# Patient Record
Sex: Male | Born: 1940 | Race: White | Hispanic: No | Marital: Married | Smoking: Never smoker
Health system: Southern US, Community
[De-identification: ages and names within clinical notes are randomized; demographics above are authoritative.]

## PROBLEM LIST (undated history)

## (undated) DIAGNOSIS — R42 Dizziness and giddiness: Secondary | ICD-10-CM

## (undated) DIAGNOSIS — Z9889 Other specified postprocedural states: Secondary | ICD-10-CM

## (undated) DIAGNOSIS — R112 Nausea with vomiting, unspecified: Secondary | ICD-10-CM

## (undated) DIAGNOSIS — M1990 Unspecified osteoarthritis, unspecified site: Secondary | ICD-10-CM

## (undated) DIAGNOSIS — I10 Essential (primary) hypertension: Secondary | ICD-10-CM

## (undated) DIAGNOSIS — G4730 Sleep apnea, unspecified: Secondary | ICD-10-CM

## (undated) DIAGNOSIS — I714 Abdominal aortic aneurysm, without rupture: Secondary | ICD-10-CM

## (undated) DIAGNOSIS — K219 Gastro-esophageal reflux disease without esophagitis: Secondary | ICD-10-CM

## (undated) DIAGNOSIS — E785 Hyperlipidemia, unspecified: Secondary | ICD-10-CM

## (undated) DIAGNOSIS — R200 Anesthesia of skin: Secondary | ICD-10-CM

## (undated) DIAGNOSIS — A4902 Methicillin resistant Staphylococcus aureus infection, unspecified site: Secondary | ICD-10-CM

## (undated) DIAGNOSIS — I2510 Atherosclerotic heart disease of native coronary artery without angina pectoris: Secondary | ICD-10-CM

## (undated) DIAGNOSIS — Z8619 Personal history of other infectious and parasitic diseases: Secondary | ICD-10-CM

## (undated) HISTORY — DX: Essential (primary) hypertension: I10

## (undated) HISTORY — PX: OTHER SURGICAL HISTORY: SHX169

## (undated) HISTORY — DX: Atherosclerotic heart disease of native coronary artery without angina pectoris: I25.10

## (undated) HISTORY — DX: Hyperlipidemia, unspecified: E78.5

## (undated) HISTORY — PX: PICC LINE PLACE PERIPHERAL (ARMC HX): HXRAD1248

## (undated) HISTORY — PX: ANGIOPLASTY: SHX39

## (undated) HISTORY — PX: ABDOMINAL AORTIC ANEURYSM REPAIR W/ ENDOLUMINAL GRAFT: SUR7

## (undated) HISTORY — DX: Abdominal aortic aneurysm, without rupture: I71.4

---

## 2004-06-07 ENCOUNTER — Inpatient Hospital Stay (HOSPITAL_COMMUNITY): Admission: EM | Admit: 2004-06-07 | Discharge: 2004-06-11 | Payer: Self-pay | Admitting: Emergency Medicine

## 2004-06-07 ENCOUNTER — Encounter (INDEPENDENT_AMBULATORY_CARE_PROVIDER_SITE_OTHER): Payer: Self-pay | Admitting: *Deleted

## 2004-06-07 HISTORY — PX: TRANSTHORACIC ECHOCARDIOGRAM: SHX275

## 2004-06-08 HISTORY — PX: OTHER SURGICAL HISTORY: SHX169

## 2004-06-08 HISTORY — PX: CARDIAC CATHETERIZATION: SHX172

## 2004-06-21 ENCOUNTER — Inpatient Hospital Stay (HOSPITAL_COMMUNITY): Admission: EM | Admit: 2004-06-21 | Discharge: 2004-06-28 | Payer: Self-pay | Admitting: Emergency Medicine

## 2004-06-25 DIAGNOSIS — I714 Abdominal aortic aneurysm, without rupture, unspecified: Secondary | ICD-10-CM

## 2004-06-25 HISTORY — DX: Abdominal aortic aneurysm, without rupture: I71.4

## 2004-06-25 HISTORY — DX: Abdominal aortic aneurysm, without rupture, unspecified: I71.40

## 2004-12-18 ENCOUNTER — Encounter: Admission: RE | Admit: 2004-12-18 | Discharge: 2004-12-18 | Payer: Self-pay | Admitting: Sports Medicine

## 2004-12-18 ENCOUNTER — Ambulatory Visit: Payer: Self-pay | Admitting: Sports Medicine

## 2005-01-18 ENCOUNTER — Ambulatory Visit: Payer: Self-pay | Admitting: Family Medicine

## 2005-02-19 ENCOUNTER — Encounter: Admission: RE | Admit: 2005-02-19 | Discharge: 2005-02-19 | Payer: Self-pay | Admitting: Sports Medicine

## 2006-08-22 DIAGNOSIS — I712 Thoracic aortic aneurysm, without rupture, unspecified: Secondary | ICD-10-CM | POA: Insufficient documentation

## 2006-08-22 DIAGNOSIS — I10 Essential (primary) hypertension: Secondary | ICD-10-CM

## 2006-08-22 DIAGNOSIS — I2510 Atherosclerotic heart disease of native coronary artery without angina pectoris: Secondary | ICD-10-CM

## 2006-08-22 DIAGNOSIS — I719 Aortic aneurysm of unspecified site, without rupture: Secondary | ICD-10-CM

## 2007-11-19 ENCOUNTER — Inpatient Hospital Stay (HOSPITAL_COMMUNITY): Admission: EM | Admit: 2007-11-19 | Discharge: 2007-11-21 | Payer: Self-pay | Admitting: Emergency Medicine

## 2009-02-14 HISTORY — PX: CARDIOVASCULAR STRESS TEST: SHX262

## 2010-11-10 NOTE — Discharge Summary (Signed)
NAMECLAUDELL, [name] NO.:  [id number]   MEDICAL RECORD NO.:  [id number]          PATIENT TYPE:  INP   LOCATION:  4703                         FACILITY:  [facility]   PHYSICIAN:  Madaline Savage, M.D.DATE OF BIRTH:  08-Oct-1940   DATE OF ADMISSION:  11/19/2007  DATE OF DISCHARGE:  11/21/2007                               DISCHARGE SUMMARY   DISCHARGE DIAGNOSES:  1. Coronary disease, catheterization this admission revealing patent      diagonal and left anterior descending stents.  2. Known coronary disease with previous left anterior descending and      diagonal stenting in 2005.  3. Aortic stent grafting at [facility], I believe in 2006.  4. Treated hypertension.  5. Dyslipidemia with statin intolerance.   HOSPITAL COURSE:  The patient is a 70 year old male followed by Dr.  Elsie Lincoln.  He has had previous LAD and diagonal stenting.  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  This was treated with aortic stent grafting at [facility], I  believe this was in early 2006.  The patient presented on Nov 19, 2007,  with substernal chest pain worrisome for unstable angina.  He has had  nuclear study in November 2008, which showed low-risk inferior ischemia  with an EF of 51%.  The patient was admitted to telemetry.  Troponins  were negative.  He was set up for CT of his chest and abdomen to  evaluate his stent graft and these were negative.  The plan was to  proceed with diagnostic catheterization.  The patient was put on  heparin.  CT scan did show no dissection, there was a question of a  penetrating ulcer noted.  His enzymes were negative.  This will be  reviewed by Dr. Elsie Lincoln, who felt he could undergo diagnostic  catheterization.  This was done on Nov 20, 2007, which revealed normal  RCA, normal left main, normal circumflex, patent LAD stent, patent  diagonal stent with a well opposed patent aortic stent graft that  appeared to extend into the iliacs.  Plan was to ambulate him.  If he  had continued pain, we were to pursue a GI consult.  He was discharged  later on Nov 21, 2007.  He will follow up with Dr. Elsie Lincoln.   DISCHARGE MEDICATIONS:  1. Metoprolol 25 mg twice a day.  2. Protonix 40 mg a day.  3. Aspirin 325 mg a day.  4. Plavix 75 mg a day.  5. Norvasc 5 mg a day.  6. Lisinopril 5 mg a day.   LABORATORY DATA:  EKG shows sinus rhythm with nonspecific ST changes.  CT scan is as noted above.  White count 10.5, hemoglobin 13.5,  hematocrit 39.2, platelets 136, INR 1.2, sodium 138, potassium 3.5, BUN  15, and creatinine 1.0.  Liver functions were normal.  TSH is 1.3 and  LDL was 100.   DISPOSITION:  The patient was discharged in stable condition and will  follow up with Dr. Elsie Lincoln in 1-3 months.  [name], P.A.    ______________________________  Madaline Savage, M.D.    Lenard Lance  D:  12/18/2007  T:  12/19/2007  Job:  [id number]

## 2010-11-10 NOTE — Discharge Summary (Signed)
[name], Werner NO.:  [id number]   MEDICAL RECORD NO.:  [id number]          PATIENT TYPE:  INP   LOCATION:  2021                         FACILITY:  [facility]   PHYSICIAN:  Madaline Savage, M.D.DATE OF BIRTH:  1940/09/10   DATE OF ADMISSION:  06/21/2004  DATE OF DISCHARGE:  06/28/2004                                 DISCHARGE SUMMARY   DISCHARGE DIAGNOSES:  1.  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  2.  Coronary disease, left anterior descending and diagonal stenting      June 08, 2004.  3.  Hypertension, under better control at discharge,  4.  Dyslipidemia.   HOSPITAL COURSE:  The patient is a 70 year old male followed by Dr. Elsie Lincoln  with had LAD and diagonal stenting June 08, 2004.  His EF is 45-55%.  He  was admitted June 21, 2004, with back pain and chest pain.  He was seen  by Dr. Clarene [facility] on admission.  His EKG was normal.  His enzymes were negative.  Chest x-ray was abnormal with some left basilar atelectasis versus effusion  and a CT scan was obtained.  This revealed a penetrating ulcer with local  dissection and pseudoaneurysm in the descending thoracic aorta.  The patient  seen by Dr. Tyrone Sage.  Plan was for tight medical control and avoidance of  heparin if possible.  His heparin was discontinued and he was monitored.  He  was watched closely in the hospital and his medications were adjusted for  hypertension.  We transferred him to floor and ambulated him.  Symptomatically, he did well.  We did feel he a concomitant case of  bronchitis and he was treated with antibiotics for this.  The patient was  ultimately transferred to [facility] for further evaluation.   TRANSFER MEDICATIONS:  1.  Lopressor 50 mg t.i.d.  2.  Avelox 400 mg a day.  3.  Lipitor 10 mg a day.  4.  Avapro 300 mg a day.  5.  Protonix 40 mg a day.  6.  Plavix 75 mg a day.  7.  Aspirin 81 mg a day.  8.  Norvasc 5 mg a day.   LABORATORY DATA AND X-RAY  FINDINGS:  EKG shows sinus rhythm, LVH, no acute  changes.  CTs are as noted above.   At transfer, his white count is 6.7, hemoglobin 12.6, hematocrit 37.3,  platelets 340.  INR 1.1.  Sodium 137, potassium 4.1, BUN nine, creatinine  1.1.  Liver functions normal.  CK-MB, troponins were negative.  Urinalysis  was unremarkable.   DISPOSITION:  The patient is transferred to [facility].  His chart was  copied and sent with him.      LKK/MEDQ  D:  09/15/2004  T:  09/16/2004  Job:  [id number]

## 2010-11-10 NOTE — Cardiovascular Report (Signed)
NAMESIDHANT, HELDERMAN NO.:  [id number]   MEDICAL RECORD NO.:  [id number]          PATIENT TYPE:  INP   LOCATION:  2931                         FACILITY:  [facility]   PHYSICIAN:  Madaline Savage, M.D.DATE OF BIRTH:  1941-05-14   DATE OF PROCEDURE:  08/10/2003  DATE OF DISCHARGE:                              CARDIAC CATHETERIZATION   History and Physical  1.  Selective coronary angiography by Judkins technique.  2.  Retrograde left heart catheterization.  3.  Left ventricular angiography.  4.  Abdominal aortography.  5.  Percutaneous coronary angioplasty followed by stenting of the mid      diagonal branch of the left coronary artery.  6.  Direct coronary stenting of the mid left anterior descending coronary      artery.   COMPLICATIONS:  None.   ENTRY SITE:  Right femoral.   CATHETERS USED:  6-French diagnostic catheters and 6-French [facility]  Scientific FL4 guide catheter.   MEDICATIONS GIVEN:  1.  Fentanyl 25 mg IV on 2 occasions.  2.  Aspirin 81 mg.  3.  Intravenous nitroglycerin.  4.  Integrilin double bolus technique followed by infusion.  5.  Ativan 1 mg IV.  6.  Demerol 5 mg and Phenergan 25 mg IV on one occasion.   PATIENT PROFILE:  The patient is a 70 year old gentleman who has no cardiac  history or chest pain, who was admitted to the emergency room after he  developed epigastric pain at work after taking medications.  He was put on a  steroids, Integrilin, and was ultimately brought to [facility].  He  had more chest pain after admission and was seen at 4 o'clock in the morning  by Dr. Effie Shy who was on call for [facility] which was  described as a worsening chest pain with ache in the lower substernal area.  No response to antacids or GI cocktail.  Increasing nitroglycerin gave  unclear improvement but possibly some.  He did have some T inversions in aVL  and some flattening in V6.  It was felt by Dr. Effie Shy that  this was  unstable angina, and we brought the patient to the cardiac catheterization  lab this morning after moving him to the CCU.   PRESSURES:  Left ventricular pressure was 125/5, end-diastolic pressure 14,  central aortic pressure 125/70, mean of 95.  No aortic valve gradient by  pullback technique.   ANGIOGRAPHIC RESULTS:  The left main coronary artery was very large in  diameter and tapered distally.  I cannot say that it was actually stenosed,  but it did taper distally.   The left anterior descending coronary artery coursed to the cardiac apex,  giving rise to one major diagonal branch but which arose near a very large  bifurcating septal perforator branch.  The proximal LAD contained tubular  calcification that almost looked like an intracoronary stent.  The patient  was repeatedly questioned and reported that no stent had ever been placed in  his LAD.  The first diagonal branch arose just at or near the first  septal  perforator branch and contained a discrete 90% stenosis in a vessel that was  2.5 mm in diameter at the area of stenosis in a fairly long vessel.   The LAD contained two lesions of definite concern, both were 75% or more in  severity.  The first of these areas was in the mid LAD about 15 to 20 mm  beyond the septal perforator branch.  The second was about 20 mm more distal  to the first area of stenosis, and it, too, was 75% in severity.  It was  fairly focal.  The very distal LAD was normal.   The circumflex coronary artery was nondominant, giving rise to a large  trifurcating first obtuse marginal branch which contained no distal lesions.  The mid and distal circumflex were rather small.  No lesions were seen in  the circumflex.   The right coronary artery was a huge vessel in circumference, giving rise to  a very large posterior descending and bifurcating posterolateral branch.  The RCA was diffusely irregular throughout, but no significant lesions were   noted.   Left ventricular angiography showed mild hypokinesis of the anterolateral  wall, good apical and inferior wall motion, no evidence of prolapse or  mitral regurgitation.  I estimated ejection fraction at 45 to 55%  qualitatively.   Abdominal aortogram showed both renal arteries to be normal.  The abdominal  aorta showed ectasia below the renals and above the common iliacs.  No  discrete aneurysm was seen.   PERCUTANEOUS CORONARY INTERVENTION:  Percutaneous coronary intervention was  then undertaken.  We gave the patient heparin adjusted for weight-based  Integrilin and obtained an ACT of 260.  We likewise gave a double bolus  Integrilin before beginning the intervention and continued intravenous  Integrilin drip during the procedure.  The guide catheter used was a 6-  [place] L4 guide catheter by [employer] which did not provide the best  backup but did provide a very soft tip that came in handy later.   The diagonal was the first area addressed.  We approached that with a  Patriot wire which easily crossed the lesion and, with some difficulty, was  brought to rest in the distal vessel.  I pre-dilated this lesion with a 2.25  Maverick balloon to get a better idea of how big this vessel was.  There was  watermelon seeding of this balloon, and it never really did much of a job of  performing angioplasty on the vessel with two attempts at balloon inflation.  I, therefore, resorted to a 2 x 2.5 x 16 mm Taxus stent which crossed the  lesion with some difficulty but was brought to rest in a satisfactory  position and was then deployed to 15 atmospheres of pressure corresponding  to an anticipated lumen diameter of 2.8.  There was preservation of TIMI-3  distal flow, and a 75% lesion was reduced to 0% residual with preservation  of the distal vessel and a smooth contour to the stented artery.  There was some difficulty getting the Taxus stent out of the vessel, but this was   accomplished by removing guide catheter, balloon, and guide wire  simultaneously.   The next vessel approached by the percutaneous intervention was the LAD.  For this vessel, I used a Forte wire which allowed [place] to size the vessel  appropriately.  We decided, rather than using two overlapping stents, to use  a single stent which would cover both areas of  lesions in the mid LAD.  The  Forte wire was placed easily anatomically.  The stent then crossed the  lesions with some difficulty.  It was a 2.75 x 28 mm Taxus stent.  I  deployed the stent after confirming proper location in multiple views, and  marked ST segment elevation occurred followed by fairly impressive and quick  re-equalization of ST segments at baseline following balloon deflation.  I  did two inflations to a peak inflation pressure of 9 atmospheres and got a  beautiful result with preservation of distal flow in the vessel and  resolution of the 75% areas of stenosis. This balloon, guidewire, guide  catheter combination, I again had to remove the entire combination of the  three together in order to get the stent balloon to come out.  It did so  atraumatically, and the patient had beautiful TIMI-3 flow in the vessel and  resolution of both stenoses.   COMPLICATIONS:  No complications occurred during the case.  The patient was  somewhat difficult to manage in terms of back pain, and he reported feeling  dizzy at times despite a normal blood pressure and heart rate.  No  significant complications occurred.   FINAL DIAGNOSES:  1.  Coronary artery disease, predominantly affecting left anterior      descending.      1.  90% mid diagonal branch stenosis.      2.  75% lesions at two areas in mid left anterior descending .  2.  Mildly decreased left ventricular systolic function, ejection fraction      45 to 55%.  3.  Successful percutaneous coronary stenting of the first diagonal branch      of the left anterior descending with  resolution of 90% lesion to 0%.  4.  Successful percutaneous stenting of the mid left anterior descending      with two lesions reduced to 0% residual with a single stent.       WHG/MEDQ  D:  06/08/2004  T:  06/08/2004  Job:  [id number]   cc:   Cardiac Catheterization Labortory

## 2010-11-10 NOTE — Discharge Summary (Signed)
NAMERODEL, GLASPY NO.:  [id number]   MEDICAL RECORD NO.:  [id number]          PATIENT TYPE:  INP   LOCATION:  2027                         FACILITY:  [facility]   PHYSICIAN:  Madaline Savage, M.D.DATE OF BIRTH:  11/15/1940   DATE OF ADMISSION:  06/07/2004  DATE OF DISCHARGE:  06/11/2004                                 DISCHARGE SUMMARY   ADMISSION DIAGNOSES:  1.  Atypical chest pain with epigastric distress.  2.  History of hives treated with steroids and Benadryl.  3.  Positive family history of coronary artery disease.   DISCHARGE DIAGNOSES:  1.  Chest pain with two-vessel disease.  Ejection fraction of 45-50%.  2.  Leukocytosis.  3.  Positive family history.   PROCEDURE:  Cardiac catheterization with percutaneous coronary intervention  and stenting of the left anterior descending and diagonal with Taxus stents  on June 08, 2004, Dr. Chanda Busing.   HISTORY OF PRESENT ILLNESS:  The patient is a 70 year old, white male with  no primary care doctor or cardiologist.  He developed epigastric pain at  work after taking medications.  He was on steroid dose pack, Benadryl and  Tagamet for 1 week for previous illness with hives.  His chest discomfort  became worse and he did have some diaphoresis and came to the ER.   PAST MEDICAL HISTORY:  Hives.   CURRENT MEDICATIONS:  None.   ALLERGIES:  No known drug allergies.   SOCIAL HISTORY:  Never smoked.  Married with two children and three  grandchildren.  He works as a Curator.   FAMILY HISTORY:  Father unknown.  Mother died in 28s.  Brother with a  history of CABG.   REVIEW OF SYSTEMS:  Negative.   For further History and Physical, please see the dictated note.   HOSPITAL COURSE:  The patient was admitted and placed on telemetry.  He had  progression of his chest pain on June 08, 2004.  He was placed on IV  nitroglycerin and transferred to the CCU where symptoms resolved.  His CKs  and  troponins were negative.  His only lab abnormality was an elevated white  count.  He subsequently underwent cardiac catheterization on June 08, 2004.  This showed the right coronary artery to be normal.  The EF was 45-  55%.  There was no MR.  The left circumflex was normal with no  abnormalities.  The LAD had a 75% stenosis followed by a second 75% stenosis  in the mid part.  The first diagonal had a 90% stenosis.  The patient  subsequently underwent PCI and Taxus stenting of both the long, 75% LAD and  the 90% diagonal lesion.  The patient tolerated the procedure well and was  transferred to the floor.  Other screening labs were normal on admission.  He was started on low dose Lipitor for stroke and MI prevention and he  planned to add Niacin later.  He has been mobilized and transferred to the  floor.  He was making good progress and by June 11, 2004, it was Dr.  McQueen's opinion he was ready for discharge.   DISCHARGE MEDICATIONS:  1.  Aspirin 81 mg daily.  2.  Plavix 75 mg daily.  3.  Avapro 150 mg q.a.m.  4.  Lopressor 25 mg b.i.d.  5.  Protonix 40 mg q.a.m.  6.  Nitroglycerin p.r.n.  7.  Lipitor 10 mg daily h.s.   ACTIVITY:  Light to moderate with no lifting over 10 pounds, no driving or  strenuous activity.   DISCHARGE LABORATORY DATA AND X-RAY FINDINGS:  White count 14.9, hemoglobin  13.7, hematocrit 40.5, platelets 156,000.  BMP with sodium 130, potassium 4,  chloride 101, CO2 25, glucose 90, BUN 19, creatinine 1.2.  Troponins and CKs  were all negative.  Lipid profile showed a cholesterol of 108, HDL 31, LDL  66.  H. pylori was negative.  Lipase was normal.  Urinalysis on admission  was normal.  BNP on admission was less than 30.  D-dimer was 1.56.   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   FOLLOW UP:  The patient will return to see Dr. Orvan Falconer in 2 weeks.   CONDITION ON DISCHARGE:  Improved.       WDJ/MEDQ  D:  06/11/2004   T:  06/12/2004  Job:  [id number]

## 2011-03-21 LAB — MAGNESIUM: Magnesium: 1.8

## 2011-03-21 LAB — POCT I-STAT, CHEM 8
BUN: 19
Calcium, Ion: 1.17
Chloride: 105
Creatinine, Ser: 1.3
Glucose, Bld: 89
HCT: 48
Hemoglobin: 16.3
Potassium: 3.7
Sodium: 140
TCO2: 25

## 2011-03-21 LAB — COMPREHENSIVE METABOLIC PANEL
ALT: 23
AST: 26
Albumin: 3.2 — ABNORMAL LOW
Alkaline Phosphatase: 85
BUN: 17
CO2: 24
Calcium: 8.6
Chloride: 106
Creatinine, Ser: 1.08
GFR calc Af Amer: >60
GFR calc non Af Amer: >60
Glucose, Bld: 119 — ABNORMAL HIGH
Potassium: 3.8
Sodium: 138
Total Bilirubin: 0.8
Total Protein: 6.2

## 2011-03-21 LAB — CBC
HCT: 39.9
HCT: 46.2
Hemoglobin: 13.5
Hemoglobin: 13.6
Hemoglobin: 15.6
MCHC: 33.8
MCHC: 34.4
MCV: 88.6
MCV: 88.8
Platelets: 191
RBC: 4.42
RBC: 4.5
RBC: 5.22
RDW: 14.1
RDW: 14.5
WBC: 10.5
WBC: 11.1 — ABNORMAL HIGH
WBC: 8.8

## 2011-03-21 LAB — POCT CARDIAC MARKERS
CKMB, poc: 1.8
CKMB, poc: 1.9
CKMB, poc: 2.3
Myoglobin, poc: 107
Myoglobin, poc: 123
Myoglobin, poc: 138
Operator id: 234501
Operator id: 234501
Operator id: 234501
Troponin i, poc: <0.05
Troponin i, poc: <0.05
Troponin i, poc: <0.05

## 2011-03-21 LAB — DIFFERENTIAL
Basophils Absolute: 0
Basophils Relative: 0
Eosinophils Absolute: 0.1
Eosinophils Relative: 1
Lymphocytes Relative: 14
Lymphs Abs: 1.3
Monocytes Absolute: 0.8
Monocytes Relative: 9
Neutro Abs: 6.6
Neutrophils Relative %: 75

## 2011-03-21 LAB — CK TOTAL AND CKMB (NOT AT ARMC)
CK, MB: 2.9
CK, MB: 4.1 — ABNORMAL HIGH
Relative Index: 1.7
Relative Index: 2.1
Total CK: 169
Total CK: 191

## 2011-03-21 LAB — BASIC METABOLIC PANEL
CO2: 25
CO2: 25
Chloride: 104
Chloride: 107
GFR calc Af Amer: >60
GFR calc Af Amer: >60
Potassium: 3.5
Potassium: 3.7
Sodium: 135
Sodium: 138

## 2011-03-21 LAB — LIPID PANEL
LDL Cholesterol: 101 — ABNORMAL HIGH
VLDL: 10

## 2011-03-21 LAB — TROPONIN I: Troponin I: 0.03

## 2011-03-21 LAB — HEPARIN LEVEL (UNFRACTIONATED): Heparin Unfractionated: 0.63

## 2012-10-02 ENCOUNTER — Other Ambulatory Visit (HOSPITAL_COMMUNITY): Payer: Self-pay | Admitting: Cardiovascular Disease

## 2012-10-02 DIAGNOSIS — I714 Abdominal aortic aneurysm, without rupture: Secondary | ICD-10-CM

## 2012-10-06 ENCOUNTER — Ambulatory Visit (HOSPITAL_COMMUNITY)
Admission: RE | Admit: 2012-10-06 | Discharge: 2012-10-06 | Disposition: A | Discharge status: Home / Self Care | Payer: Medicare Other | Source: Ambulatory Visit | Attending: Cardiovascular Disease | Admitting: Cardiovascular Disease

## 2012-10-06 DIAGNOSIS — I714 Abdominal aortic aneurysm, without rupture, unspecified: Secondary | ICD-10-CM | POA: Insufficient documentation

## 2012-10-06 NOTE — Progress Notes (Signed)
Aorta Duplex Completed. [name]  

## 2013-09-02 ENCOUNTER — Other Ambulatory Visit (HOSPITAL_COMMUNITY): Payer: Self-pay | Admitting: Cardiovascular Disease

## 2013-12-22 ENCOUNTER — Ambulatory Visit (INDEPENDENT_AMBULATORY_CARE_PROVIDER_SITE_OTHER): Payer: Medicare Other | Admitting: Cardiovascular Disease

## 2013-12-22 ENCOUNTER — Encounter: Payer: Self-pay | Admitting: Cardiovascular Disease

## 2013-12-22 VITALS — BP 132/88 | HR 68 | Ht 74.0 in | Wt 227.0 lb

## 2013-12-22 DIAGNOSIS — Z8679 Personal history of other diseases of the circulatory system: Secondary | ICD-10-CM

## 2013-12-22 DIAGNOSIS — I719 Aortic aneurysm of unspecified site, without rupture: Secondary | ICD-10-CM

## 2013-12-22 DIAGNOSIS — I10 Essential (primary) hypertension: Secondary | ICD-10-CM

## 2013-12-22 DIAGNOSIS — Z9889 Other specified postprocedural states: Secondary | ICD-10-CM

## 2013-12-22 DIAGNOSIS — I739 Peripheral vascular disease, unspecified: Secondary | ICD-10-CM

## 2013-12-22 DIAGNOSIS — E782 Mixed hyperlipidemia: Secondary | ICD-10-CM

## 2013-12-22 DIAGNOSIS — I2510 Atherosclerotic heart disease of native coronary artery without angina pectoris: Secondary | ICD-10-CM

## 2013-12-22 DIAGNOSIS — Z79899 Other long term (current) drug therapy: Secondary | ICD-10-CM

## 2013-12-22 NOTE — Assessment & Plan Note (Signed)
Controlled on current medications 

## 2013-12-22 NOTE — Progress Notes (Signed)
12/22/2013 [name]   1941-01-29  [id number]  Primary Physician Pcp Not In System Primary Cardiologist: Runell GessJonathan J. Haili Donofrio MD Roseanne RenoFACP,FACC,FAHA, FSCAI   HPI:  The patient is a 73 year old mildly overweight married Caucasian male father of 2, grandfather to 3 grandchildren who I last saw a year ago. He was previously a patient of Dr. Pierre BaliBill Gamble's who had done LAD and diagonal branch stenting June 08, 2004. He had normal LV function at that time. [facility] to that. He also had an [facility] 2006 for an abdominal aortic aneurysm. He was last ultrasounded 2 years ago and had no evidence of endoleak. His other problems include hypertension and hyperlipidemia. He denies chest pain or shortness of breath. His only other complaint is I saw him one year ago was right hip and lower extremity discomfort with ambulation.    Current Outpatient Prescriptions  Medication Sig Dispense Refill  . amLODipine (NORVASC) 10 MG tablet Take 10 mg by mouth daily.      Marland Kitchen. aspirin 325 MG tablet Take 325 mg by mouth daily.      . clopidogrel (PLAVIX) 75 MG tablet Take 75 mg by mouth daily.      . metoprolol tartrate (LOPRESSOR) 25 MG tablet Take 25 mg by mouth daily.      . pantoprazole (PROTONIX) 40 MG tablet Take 40 mg by mouth daily.       No current facility-administered medications for this visit.    No Known Allergies  History   Social History  . Marital Status: Married    Spouse Name: N/A    Number of Children: N/A  . Years of Education: N/A   Occupational History  . Not on file.   Social History Main Topics  . Smoking status: Former Games developermoker  . Smokeless tobacco: Not on file     Comment: smoked in his teenage years  . Alcohol Use: Not on file  . Drug Use: Not on file  . Sexual Activity: Not on file   Other Topics Concern  . Not on file   Social History Narrative  . No narrative on file     Review of  Systems: General: negative for chills, fever, night sweats or weight changes.  Cardiovascular: negative for chest pain, dyspnea on exertion, edema, orthopnea, palpitations, paroxysmal nocturnal dyspnea or shortness of breath Dermatological: negative for rash Respiratory: negative for cough or wheezing Urologic: negative for hematuria Abdominal: negative for nausea, vomiting, diarrhea, bright red blood per rectum, melena, or hematemesis Neurologic: negative for visual changes, syncope, or dizziness All other systems reviewed and are otherwise negative except as noted above.    Blood pressure 132/88, pulse 68, height 6\' 2"  (1.88 m), weight 227 lb (102.967 kg).  General appearance: alert and no distress Neck: no adenopathy, no carotid bruit, no JVD, supple, symmetrical, trachea midline and thyroid not enlarged, symmetric, no tenderness/mass/nodules Lungs: clear to auscultation bilaterally Heart: regular rate and rhythm, S1, S2 normal, no murmur, click, rub or gallop Extremities: extremities normal, atraumatic, no cyanosis or edema and 1+ pedal pulses bilaterally  EKG normal sinus rhythm at 68 without ST or T wave changes. There was early R wave transition and evidence of LVH  ASSESSMENT AND PLAN:   ANEURYSM, AORTA Status post endoluminal stent grafting of his abdominal aortic aneurysm [facility] in [facility]. Abdominal ultrasound performed 10/06/12 showed an intact endograft without obvious endoleak. 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 We will recheck this. He also complains of right hip and leg discomfort with ambulation. I'm going to check lower extremity arterial Dopplers as well  HYPERTENSION, BENIGN SYSTEMIC Controlled on current medications  CORONARY, ARTERIOSCLEROSIS History of CAD status post stenting of his LAD and diagonal branch by Dr. Lavonne ChickBill Gamble 06/08/04. He had no other significant CAD. He had normal LV function. He denies chest pain or shortness of  breath.      Runell GessJonathan J. Deneen Slager MD FACP,FACC,FAHA, [facility] 12/22/2013 5:05 PM

## 2013-12-22 NOTE — Assessment & Plan Note (Signed)
History of CAD status post stenting of his LAD and diagonal branch by Dr. Lavonne ChickBill Gamble 06/08/04. He had no other significant CAD. He had normal LV function. He denies chest pain or shortness of breath.

## 2013-12-22 NOTE — Assessment & Plan Note (Signed)
Status post end[facility]uminal stent grafting [facility] in [facility]. Abdominal ultrasound performed 10/06/12 showed an intact endograft without obvious endoleak. 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 We will recheck this. He also complains of right hip and leg discomfort with ambulation. I'm going to check lower extremity arterial Dopplers as well

## 2013-12-22 NOTE — Patient Instructions (Signed)
  We will see you back in follow up in 1 year with Dr Allyson SabalBerry.   Dr Allyson SabalBerry has ordered : 1. Abdominal aorta duplex. During this test, an ultrasound is used to evaluate the aorta. Allow 30 minutes for this exam. Do not eat after midnight the day before and avoid carbonated beverages  2. lower extremity arterial doppler- During this test, ultrasound is used to evaluate arterial blood flow in the legs. Allow approximately one hour for this exam.   3. Your physician recommends that you return for a FASTING lipid profile

## 2013-12-23 ENCOUNTER — Telehealth (HOSPITAL_COMMUNITY): Payer: Self-pay | Admitting: *Deleted

## 2013-12-23 ENCOUNTER — Other Ambulatory Visit (HOSPITAL_COMMUNITY): Payer: Self-pay | Admitting: Cardiovascular Disease

## 2013-12-23 NOTE — Telephone Encounter (Signed)
Rx was sent to pharmacy electronically. 

## 2014-01-01 ENCOUNTER — Ambulatory Visit (HOSPITAL_BASED_OUTPATIENT_CLINIC_OR_DEPARTMENT_OTHER)
Admission: RE | Admit: 2014-01-01 | Discharge: 2014-01-01 | Disposition: A | Discharge status: Home / Self Care | Payer: Medicare Other | Source: Ambulatory Visit | Attending: Cardiovascular Disease | Admitting: Cardiovascular Disease

## 2014-01-01 ENCOUNTER — Ambulatory Visit (HOSPITAL_COMMUNITY)
Admission: RE | Admit: 2014-01-01 | Discharge: 2014-01-01 | Disposition: A | Discharge status: Home / Self Care | Payer: Medicare Other | Source: Ambulatory Visit | Attending: Cardiovascular Disease | Admitting: Cardiovascular Disease

## 2014-01-01 DIAGNOSIS — I70219 Atherosclerosis of native arteries of extremities with intermittent claudication, unspecified extremity: Secondary | ICD-10-CM

## 2014-01-01 DIAGNOSIS — I739 Peripheral vascular disease, unspecified: Secondary | ICD-10-CM

## 2014-01-01 DIAGNOSIS — I714 Abdominal aortic aneurysm, without rupture, unspecified: Secondary | ICD-10-CM

## 2014-01-01 DIAGNOSIS — Z9889 Other specified postprocedural states: Principal | ICD-10-CM

## 2014-01-01 DIAGNOSIS — I719 Aortic aneurysm of unspecified site, without rupture: Secondary | ICD-10-CM

## 2014-01-01 DIAGNOSIS — Z8679 Personal history of other diseases of the circulatory system: Secondary | ICD-10-CM

## 2014-01-01 NOTE — Progress Notes (Addendum)
Lower Ext. Arterial Duplex Completed. Azure Barrales, BS, RDMS, RVT  

## 2014-01-01 NOTE — Progress Notes (Signed)
Aorta Duplex Completed. Artemisa Sladek, BS, RDMS, RVT  

## 2014-01-12 ENCOUNTER — Encounter: Payer: Self-pay | Admitting: *Deleted

## 2014-01-12 ENCOUNTER — Telehealth: Payer: Self-pay | Admitting: *Deleted

## 2014-01-12 DIAGNOSIS — I714 Abdominal aortic aneurysm, without rupture, unspecified: Secondary | ICD-10-CM

## 2014-01-12 NOTE — Telephone Encounter (Signed)
Message copied by Marella BileVOGEL, Kayode Petion W. on Tue Jan 12, 2014  1:10 PM ------      Message from: Runell GessBERRY, [name]      Created: Tue Jan 12, 2014  7:47 AM       No change from prior study. Repeat in 12 months. ------

## 2014-01-12 NOTE — Telephone Encounter (Signed)
Order placed for repeat doppler in 1 year

## 2014-01-23 DEATH — deceased

## 2014-11-30 ENCOUNTER — Encounter: Payer: Self-pay | Admitting: *Deleted

## 2014-12-14 ENCOUNTER — Other Ambulatory Visit (HOSPITAL_COMMUNITY): Payer: Self-pay | Admitting: Cardiovascular Disease

## 2014-12-14 NOTE — Telephone Encounter (Signed)
Rx(s) sent to pharmacy electronically. Message sent to Billie, Dr. Berry's scheduler, to contact patient for appointment.  

## 2014-12-31 ENCOUNTER — Encounter: Payer: Self-pay | Admitting: Cardiovascular Disease

## 2014-12-31 ENCOUNTER — Ambulatory Visit (INDEPENDENT_AMBULATORY_CARE_PROVIDER_SITE_OTHER): Payer: Medicare Other | Admitting: Cardiovascular Disease

## 2014-12-31 VITALS — BP 102/82 | HR 61 | Ht 74.0 in | Wt 227.3 lb

## 2014-12-31 DIAGNOSIS — E785 Hyperlipidemia, unspecified: Secondary | ICD-10-CM

## 2014-12-31 DIAGNOSIS — I2510 Atherosclerotic heart disease of native coronary artery without angina pectoris: Secondary | ICD-10-CM | POA: Diagnosis not present

## 2014-12-31 DIAGNOSIS — I10 Essential (primary) hypertension: Secondary | ICD-10-CM

## 2014-12-31 DIAGNOSIS — I719 Aortic aneurysm of unspecified site, without rupture: Secondary | ICD-10-CM | POA: Diagnosis not present

## 2014-12-31 MED ORDER — AMLODIPINE BESYLATE 10 MG PO TABS: 10.0000 mg | ORAL_TABLET | Freq: Every day | ORAL | Status: DC

## 2014-12-31 MED ORDER — PANTOPRAZOLE SODIUM 40 MG PO TBEC: 40.0000 mg | DELAYED_RELEASE_TABLET | Freq: Every day | ORAL | Status: DC

## 2014-12-31 MED ORDER — METOPROLOL TARTRATE 25 MG PO TABS: 25.0000 mg | ORAL_TABLET | Freq: Every day | ORAL | Status: DC

## 2014-12-31 NOTE — Assessment & Plan Note (Signed)
History of hypertension blood pressure measured at 102/82. He is on amlodipine and metoprolol. Continue current meds at current dosing

## 2014-12-31 NOTE — Patient Instructions (Signed)
Dr Berry recommends that you schedule a follow-up appointment in 1 year. You will receive a reminder letter in the mail two months in advance. If you don't receive a letter, please call our office to schedule the follow-up appointment. 

## 2014-12-31 NOTE — Progress Notes (Signed)
12/31/2014 [name]   May 30, 1941  [id number]  Primary Physician Pcp Not In System Primary Cardiologist: Gavin GessJonathan J. [name] [name],[name],[name], [name]   HPI:  The patient is a 74 year-old mildly overweight married Caucasian male father of 2, grandfather to 3 grandchildren who I last saw a year ago. He was previously a patient of Dr. Pierre BaliBill Werner's who had done LAD and diagonal branch stenting June 08, 2004. He had normal LV function at that time. He [facility] subsequent to that. He also had an Endoluminal stent graft placed [facility] in 2006 for an abdominal aortic aneurysm. He was last ultrasounded 2 years ago and had no evidence of endoleak. His other problems include hypertension and hyperlipidemia. He denies chest pain or shortness of breath.when I saw him a year ago he was complaining of some lower extremity discomfort with ambulation. Dopplers performed 01/01/14 revealed no evidence of obstructive disease.  Current Outpatient Prescriptions  Medication Sig Dispense Refill  . amLODipine (NORVASC) 10 MG tablet Take 1 tablet (10 mg total) by mouth daily. 90 tablet 3  . aspirin 325 MG tablet Take 325 mg by mouth daily.    . clopidogrel (PLAVIX) 75 MG tablet Take 1 tablet (75 mg total) by mouth daily. <PLEASE MAKE APPOINTMENT FOR REFILLS> 90 tablet 0  . metoprolol tartrate (LOPRESSOR) 25 MG tablet Take 1 tablet (25 mg total) by mouth daily. 90 tablet 3  . pantoprazole (PROTONIX) 40 MG tablet Take 1 tablet (40 mg total) by mouth daily. 90 tablet 3   No current facility-administered medications for this visit.    Allergies  Allergen Reactions  . Crestor [Rosuvastatin]   . Lipitor [Atorvastatin]     History   Social History  . Marital Status: Married    Spouse Name: N/A  . Number of Children: N/A  . Years of Education: N/A   Occupational History  . Not on file.   Social History Main Topics  . Smoking status: Former Games developermoker  . Smokeless tobacco: Not  on file     Comment: smoked in his teenage years  . Alcohol Use: Not on file  . Drug Use: Not on file  . Sexual Activity: Not on file   Other Topics Concern  . Not on file   Social History Narrative     Review of Systems: General: negative for chills, fever, night sweats or weight changes.  Cardiovascular: negative for chest pain, dyspnea on exertion, edema, orthopnea, palpitations, paroxysmal nocturnal dyspnea or shortness of breath Dermatological: negative for rash Respiratory: negative for cough or wheezing Urologic: negative for hematuria Abdominal: negative for nausea, vomiting, diarrhea, bright red blood per rectum, melena, or hematemesis Neurologic: negative for visual changes, syncope, or dizziness All other systems reviewed and are otherwise negative except as noted above.    Blood pressure 102/82, pulse 61, height 6\' 2"  (1.88 m), weight 227 lb 4.8 oz (103.103 kg).  General appearance: alert and no distress Neck: no adenopathy, no carotid bruit, no JVD, supple, symmetrical, trachea midline and thyroid not enlarged, symmetric, no tenderness/mass/nodules Lungs: clear to auscultation bilaterally Heart: regular rate and rhythm, S1, S2 normal, no murmur, click, rub or gallop Extremities: extremities normal, atraumatic, no cyanosis or edema  EKG normal sinus rhythm at 61 without ST or T-wave changes. I personally reviewed this EKG  ASSESSMENT AND PLAN:   HYPERTENSION, BENIGN SYSTEMIC History of hypertension blood pressure measured at 102/82. He is on amlodipine and metoprolol. Continue current meds at  current dosing  Hyperlipidemia History of hyperlipidemia intolerant to statin drugs  Coronary atherosclerosis History of coronary artery disease status post LAD and diagonal branch stenting by Dr. Lavonne Werner 06/08/04. He had normal LV function. He denies chest pain or shortness of breath. He was restented at [facility] subsequent to that.  Aortic aneurysm History of abdominal  aortic aneurysm status post endoluminal stent grafting at [facility] in 2006. Abdominal ultrasound performed 10/06/12 showed intact endograft without obvious endoleak. 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 He complained of discomfort in his legs and we obtained arterial Doppler studies on him 01/01/14 revealing a normal ABI on the left with no evidence of obstructive disease.      Gavin Gess Werner [name],[name],[name], [facility] 12/31/2014 12:10 PM

## 2014-12-31 NOTE — Assessment & Plan Note (Signed)
History of coronary artery disease status post LAD and diagonal branch stenting by Dr. Lavonne ChickBill Gamble 06/08/04. He had normal LV function. He denies chest pain or shortness of breath. [facility] subsequent to that.

## 2014-12-31 NOTE — Assessment & Plan Note (Signed)
History of abdominal aortic aneurysm status post [facility]uminal stent grafting [facility] in 2006. Abdominal ultrasound performed 10/06/12 showed intact endograft without obvious endoleak. 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 He complained of discomfort in his legs and we obtained arterial Doppler studies on him 01/01/14 revealing a normal ABI on the left with no evidence of obstructive disease.

## 2014-12-31 NOTE — Assessment & Plan Note (Signed)
History of hyperlipidemia intolerant to statin drugs 

## 2015-03-12 ENCOUNTER — Other Ambulatory Visit (HOSPITAL_COMMUNITY): Payer: Self-pay | Admitting: Cardiovascular Disease

## 2015-03-14 NOTE — Telephone Encounter (Signed)
Rx(s) sent to pharmacy electronically.  

## 2015-07-28 ENCOUNTER — Telehealth: Payer: Self-pay | Admitting: Cardiovascular Disease

## 2015-07-28 NOTE — Telephone Encounter (Signed)
She wants to know if  Dr Allyson Sabal will approve him for Erectile Dysfunction medicine. Does pt need to be seen first before this can be determined?

## 2015-07-28 NOTE — Telephone Encounter (Signed)
I have no prob with pt going on ED drug

## 2015-07-28 NOTE — Telephone Encounter (Signed)
Communicated and deferred to primary care to prescribe.

## 2015-07-28 NOTE — Telephone Encounter (Signed)
Pt seen by Cletis Athens, PA yesterday and had inquired about ED meds. They wanted to call & check w/ [place] first.  Pt BP 122/78, HR 56 yesterday at visit.  Last seen by Dr. Allyson Sabal in July 2016.  Currently not on a nitrate.  Will route for advice.

## 2015-09-02 ENCOUNTER — Telehealth: Payer: Self-pay

## 2015-09-02 DIAGNOSIS — I2510 Atherosclerotic heart disease of native coronary artery without angina pectoris: Secondary | ICD-10-CM

## 2015-09-02 NOTE — Telephone Encounter (Signed)
Requesting surgical clearance:   1. Type of surgery: Lumbar Decompression  2. Surgeon: Dr. Marina GoodellPerry  3. Surgical date: pending clearance  4. Medications that need to be held: plavix  5. CAD: yes     6. I will defer to: Candise BowensBerry  [facility] Fax; [phone] Phone; [phone]

## 2015-09-03 NOTE — Telephone Encounter (Signed)
OK to interrupt DAPT. Will need Lexiscan to clear to surg.

## 2015-09-05 NOTE — Telephone Encounter (Signed)
Called pt no VM setup; will continue to call.

## 2015-09-07 NOTE — Telephone Encounter (Signed)
Spoke with pt wife said it is best to reach him during the day on Work phone.  Pt to be called tomorrow, 3/16 to be setup for LexiMyoview.

## 2015-09-09 NOTE — Telephone Encounter (Signed)
Pt scheduled for Lexi on 3/30

## 2015-09-20 ENCOUNTER — Telehealth (HOSPITAL_COMMUNITY): Payer: Self-pay

## 2015-09-20 NOTE — Telephone Encounter (Signed)
Encounter complete. 

## 2015-09-21 ENCOUNTER — Telehealth (HOSPITAL_COMMUNITY): Payer: Self-pay

## 2015-09-21 NOTE — Telephone Encounter (Signed)
Encounter complete. 

## 2015-09-22 ENCOUNTER — Ambulatory Visit (HOSPITAL_COMMUNITY)
Admission: RE | Admit: 2015-09-22 | Discharge: 2015-09-22 | Disposition: A | Discharge status: Home / Self Care | Payer: Medicare Other | Source: Ambulatory Visit | Attending: Cardiology | Admitting: Cardiology

## 2015-09-22 DIAGNOSIS — I739 Peripheral vascular disease, unspecified: Secondary | ICD-10-CM | POA: Diagnosis not present

## 2015-09-22 DIAGNOSIS — I119 Hypertensive heart disease without heart failure: Secondary | ICD-10-CM | POA: Diagnosis not present

## 2015-09-22 DIAGNOSIS — I2510 Atherosclerotic heart disease of native coronary artery without angina pectoris: Secondary | ICD-10-CM | POA: Diagnosis present

## 2015-09-22 DIAGNOSIS — R110 Nausea: Secondary | ICD-10-CM | POA: Insufficient documentation

## 2015-09-22 DIAGNOSIS — R42 Dizziness and giddiness: Secondary | ICD-10-CM | POA: Insufficient documentation

## 2015-09-22 DIAGNOSIS — Z8249 Family history of ischemic heart disease and other diseases of the circulatory system: Secondary | ICD-10-CM | POA: Diagnosis not present

## 2015-09-22 DIAGNOSIS — R9439 Abnormal result of other cardiovascular function study: Secondary | ICD-10-CM | POA: Insufficient documentation

## 2015-09-22 DIAGNOSIS — E663 Overweight: Secondary | ICD-10-CM | POA: Diagnosis not present

## 2015-09-22 LAB — MYOCARDIAL PERFUSION IMAGING
CHL CUP NUCLEAR SRS: 9
CHL CUP NUCLEAR SSS: 9
CHL CUP RESTING HR STRESS: 61 {beats}/min
CSEPPHR: 76 {beats}/min
LV dias vol: 130 mL (ref 62–150)
LV sys vol: 68 mL
SDS: 0
TID: 1.22

## 2015-09-22 MED ORDER — TECHNETIUM TC 99M SESTAMIBI GENERIC - CARDIOLITE
30.2000 | Freq: Once | INTRAVENOUS | Status: AC | PRN
  Administered 2015-09-22: 30.2 via INTRAVENOUS

## 2015-09-22 MED ORDER — REGADENOSON 0.4 MG/5ML IV SOLN
0.4000 mg | Freq: Once | INTRAVENOUS | Status: AC
  Administered 2015-09-22: 0.4 mg via INTRAVENOUS

## 2015-09-22 MED ORDER — TECHNETIUM TC 99M SESTAMIBI GENERIC - CARDIOLITE
9.5000 | Freq: Once | INTRAVENOUS | Status: AC | PRN
  Administered 2015-09-22: 10 via INTRAVENOUS

## 2015-10-05 ENCOUNTER — Ambulatory Visit: Payer: Medicare Other | Admitting: Cardiovascular Disease

## 2015-10-11 ENCOUNTER — Encounter: Payer: Self-pay | Admitting: Cardiovascular Disease

## 2015-10-11 ENCOUNTER — Ambulatory Visit
Admission: RE | Admit: 2015-10-11 | Discharge: 2015-10-11 | Disposition: A | Discharge status: Home / Self Care | Payer: Medicare Other | Source: Ambulatory Visit | Attending: Cardiovascular Disease | Admitting: Cardiovascular Disease

## 2015-10-11 ENCOUNTER — Ambulatory Visit (INDEPENDENT_AMBULATORY_CARE_PROVIDER_SITE_OTHER): Payer: Medicare Other | Admitting: Cardiovascular Disease

## 2015-10-11 VITALS — BP 138/64 | HR 42 | Ht 74.0 in | Wt 229.5 lb

## 2015-10-11 DIAGNOSIS — I10 Essential (primary) hypertension: Secondary | ICD-10-CM

## 2015-10-11 DIAGNOSIS — I719 Aortic aneurysm of unspecified site, without rupture: Secondary | ICD-10-CM | POA: Diagnosis not present

## 2015-10-11 DIAGNOSIS — I2510 Atherosclerotic heart disease of native coronary artery without angina pectoris: Secondary | ICD-10-CM

## 2015-10-11 DIAGNOSIS — E785 Hyperlipidemia, unspecified: Secondary | ICD-10-CM | POA: Diagnosis not present

## 2015-10-11 LAB — CBC WITH DIFFERENTIAL/PLATELET
Basophils Absolute: 0 cells/uL (ref 0–200)
Basophils Relative: 0 %
Eosinophils Absolute: 60 cells/uL (ref 15–500)
Eosinophils Relative: 1 %
HCT: 45.2 % (ref 38.5–50.0)
HEMOGLOBIN: 15.2 g/dL (ref 13.2–17.1)
Lymphocytes Relative: 24 %
Lymphs Abs: 1440 cells/uL (ref 850–3900)
MCH: 29.6 pg (ref 27.0–33.0)
MCHC: 33.6 g/dL (ref 32.0–36.0)
MCV: 87.9 fL (ref 80.0–100.0)
MPV: 10.2 fL (ref 7.5–12.5)
Monocytes Absolute: 480 cells/uL (ref 200–950)
Monocytes Relative: 8 %
NEUTROS ABS: 4020 {cells}/uL (ref 1500–7800)
NEUTROS PCT: 67 %
Platelets: 175 10*3/uL (ref 140–400)
RBC: 5.14 MIL/uL (ref 4.20–5.80)
RDW: 14.4 % (ref 11.0–15.0)
WBC: 6 10*3/uL (ref 3.8–10.8)

## 2015-10-11 LAB — BASIC METABOLIC PANEL
BUN: 21 mg/dL (ref 7–25)
CALCIUM: 9 mg/dL (ref 8.6–10.3)
CO2: 25 mmol/L (ref 20–31)
Chloride: 103 mmol/L (ref 98–110)
Creat: 1.32 mg/dL — ABNORMAL HIGH (ref 0.70–1.18)
Glucose, Bld: 133 mg/dL — ABNORMAL HIGH (ref 65–99)
Potassium: 3.7 mmol/L (ref 3.5–5.3)
SODIUM: 141 mmol/L (ref 135–146)

## 2015-10-11 LAB — TSH: TSH: 2.61 mIU/L (ref 0.40–4.50)

## 2015-10-11 NOTE — Assessment & Plan Note (Signed)
History of hyperlipidemia not on statin therapy. 

## 2015-10-11 NOTE — Assessment & Plan Note (Signed)
History of the status post LAD diagonal branch stent 06/08/04 by Dr. Orvan Falconerampbell with normal LV function at that time. He [facility]  [facility] subsequent quit that. He had a Myoview stress test test done for preoperative clearance/4/17 which was high risk suggesting ischemia in the LAD distribution. He denies chest pain. He will need diagnostic coronary angiography via the right radial approach to further evaluate this.

## 2015-10-11 NOTE — Patient Instructions (Addendum)
Medication Instructions:  Your physician recommends that you continue on your current medications as directed. Please refer to the Current Medication list given to you today.   Labwork: Your physician recommends that you return for lab work in: Today - bmet, cbc, pt/inr, ptt The lab can be found on the FIRST FLOOR of out building in [address]   Testing/Procedures: Your physician has requested that you have a cardiac catheterization. Cardiac catheterization is used to diagnose and/or treat various heart conditions. Doctors may recommend this procedure for a number of different reasons. The most common reason is to evaluate chest pain. Chest pain can be a symptom of coronary artery disease (CAD), and cardiac catheterization can show whether plaque is narrowing or blocking your heart's arteries. This procedure is also used to evaluate the valves, as well as measure the blood flow and oxygen levels in different parts of your heart. For further information please visit [email or web address]. Thursday 4/20 (abnormal myoview)  Following your catheterization, you will not be allowed to drive for 3 days.  No lifting, pushing, or pulling greater that 10 pounds is allowed for 1 week.  You will be required to have the following tests prior to the procedure:  1. Blood work-the blood work can be done no more than 7 days prior to the procedure.  It can be done at any [facility] lab.  There is one downstairs on the first floor of this building and one in the [facility] building [phone]([address], [address]).  2. Chest Xray-the chest xray order has already been placed at the [facility].      Puncture site: RADIAL    Any Other Special Instructions Will Be Listed Below (If Applicable).     If you need a refill on your cardiac medications before your next appointment, please call your pharmacy.

## 2015-10-11 NOTE — Progress Notes (Signed)
   10/11/2015 [name]   11/11/1940  [id number]  Primary Physician Pcp Not In System Primary Cardiologist: [name]. Berry MD FACP,FACC,FAHA, FSCAI   HPI:  The patient is a 74year-old mildly overweight married Caucasian male father of 2, grandfather to 3 grandchildren who I last saw 12/31/14.. He was previously a patient of Dr. Bill Gamble's who had done LAD and diagonal branch stenting June 08, 2004. He had normal LV function at that time. He had re-stenting at [facility] subsequent to that. He also had an Endoluminal stent graft placed at [facility] in 2006 for an abdominal aortic aneurysm. He was last ultrasounded 2 years ago and had no evidence of endoleak. His other problems include hypertension and hyperlipidemia. He denies chest pain or shortness of breath.when I saw him a year ago he was complaining of some lower extremity discomfort with ambulation. Dopplers performed 01/01/14 revealed no evidence of obstructive disease. He apparently needs back surgery. A pharmacologic Myoview stress test done for preoperative clearance was read as high risk with severe anteroseptal and apical ischemia.   Current Outpatient Prescriptions  Medication Sig Dispense Refill  . amLODipine (NORVASC) 10 MG tablet Take 1 tablet (10 mg total) by mouth daily. 90 tablet 3  . aspirin 325 MG tablet Take 325 mg by mouth daily.    . clopidogrel (PLAVIX) 75 MG tablet TAKE 1 TABLET BY MOUTH EVERY DAY 90 tablet 3  . metoprolol tartrate (LOPRESSOR) 25 MG tablet Take 1 tablet (25 mg total) by mouth daily. 90 tablet 3  . pantoprazole (PROTONIX) 40 MG tablet Take 1 tablet (40 mg total) by mouth daily. 90 tablet 3  . Vitamin D, Cholecalciferol, 1000 units CAPS Take 1 tablet by mouth daily.     No current facility-administered medications for this visit.    Allergies  Allergen Reactions  . Crestor [Rosuvastatin]   . Lipitor [Atorvastatin]     Social History   Social History  . Marital Status: Married   Spouse Name: N/A  . Number of Children: N/A  . Years of Education: N/A   Occupational History  . Not on file.   Social History Main Topics  . Smoking status: Former Smoker  . Smokeless tobacco: Not on file     Comment: smoked in his teenage years  . Alcohol Use: Not on file  . Drug Use: Not on file  . Sexual Activity: Not on file   Other Topics Concern  . Not on file   Social History Narrative     Review of Systems: General: negative for chills, fever, night sweats or weight changes.  Cardiovascular: negative for chest pain, dyspnea on exertion, edema, orthopnea, palpitations, paroxysmal nocturnal dyspnea or shortness of breath Dermatological: negative for rash Respiratory: negative for cough or wheezing Urologic: negative for hematuria Abdominal: negative for nausea, vomiting, diarrhea, bright red blood per rectum, melena, or hematemesis Neurologic: negative for visual changes, syncope, or dizziness All other systems reviewed and are otherwise negative except as noted above.    Blood pressure 138/64, pulse 42, height 6' 2" (1.88 m), weight 229 lb 8 oz (104.101 kg), SpO2 90 %.  General appearance: alert and no distress Neck: no adenopathy, no carotid bruit, no JVD, supple, symmetrical, trachea midline and thyroid not enlarged, symmetric, no tenderness/mass/nodules Lungs: clear to auscultation bilaterally Heart: regular rate and rhythm, S1, S2 normal, no murmur, click, rub or gallop Extremities: extremities normal, atraumatic, no cyanosis or edema  EKG sinus rhythm at 73 with evidence of LV   voltage and PACs. I personally reviewed his EKG  ASSESSMENT AND PLAN:   HYPERTENSION, BENIGN SYSTEMIC History of hypertension blood pressure measures 138/64. He is on amlodipine and metoprolol. Continue current meds at current dose  Hyperlipidemia History of hyperlipidemia not on statin therapy  Coronary atherosclerosis History of the status post LAD diagonal branch stent 06/08/04  by Dr. Campbell with normal LV function at that time. He had restenting  At [facility] subsequent quit that. He had a Myoview stress test test done for preoperative clearance/4/17 which was high risk suggesting ischemia in the LAD distribution. He denies chest pain. He will need diagnostic coronary angiography via the right radial approach to further evaluate this.  Aortic aneurysm History of abdominal aortic aneurysm status post endoluminal stent grafting at [facility] in 2006. Ultrasound performed 10/06/12 showed an intact endograft without obvious endoleak.      [name]. Berry MD FACP,FACC,FAHA, FSCAI 10/11/2015 2:01 PM  

## 2015-10-11 NOTE — Assessment & Plan Note (Signed)
History of abdominal aortic aneurysm status post [facility]uminal stent grafting at [facility] in 2006. Ultrasound performed 10/06/12 showed an intact endograft without obvious endoleak.

## 2015-10-11 NOTE — Assessment & Plan Note (Signed)
History of hypertension blood pressure measures 138/64. He is on amlodipine and metoprolol. Continue current meds at current dose

## 2015-10-12 ENCOUNTER — Other Ambulatory Visit: Payer: Self-pay

## 2015-10-12 ENCOUNTER — Telehealth: Payer: Self-pay | Admitting: *Deleted

## 2015-10-12 DIAGNOSIS — R9439 Abnormal result of other cardiovascular function study: Secondary | ICD-10-CM

## 2015-10-12 LAB — PROTIME-INR
INR: 1.01 (ref <1.50)
Prothrombin Time: 13.4 seconds (ref 11.6–15.2)

## 2015-10-12 LAB — APTT: aPTT: 31 seconds (ref 24–37)

## 2015-10-12 NOTE — Telephone Encounter (Signed)
Spoke with [name] regarding time change for heart cath 10/13/15---procedure time is 1:30 pm  Arrival time 11:30 am---[name] voiced her understanding.

## 2015-10-13 ENCOUNTER — Ambulatory Visit (HOSPITAL_COMMUNITY)
Admission: RE | Admit: 2015-10-13 | Discharge: 2015-10-13 | Disposition: A | Discharge status: Home / Self Care | Payer: Medicare Other | Source: Ambulatory Visit | Attending: Cardiovascular Disease | Admitting: Cardiovascular Disease

## 2015-10-13 ENCOUNTER — Encounter (HOSPITAL_COMMUNITY)
Admission: RE | Disposition: A | Discharge status: Home / Self Care | Payer: Self-pay | Source: Ambulatory Visit | Attending: Cardiovascular Disease

## 2015-10-13 DIAGNOSIS — I2583 Coronary atherosclerosis due to lipid rich plaque: Secondary | ICD-10-CM | POA: Diagnosis not present

## 2015-10-13 DIAGNOSIS — E663 Overweight: Secondary | ICD-10-CM | POA: Diagnosis not present

## 2015-10-13 DIAGNOSIS — Z7982 Long term (current) use of aspirin: Secondary | ICD-10-CM | POA: Diagnosis not present

## 2015-10-13 DIAGNOSIS — I10 Essential (primary) hypertension: Secondary | ICD-10-CM | POA: Insufficient documentation

## 2015-10-13 DIAGNOSIS — Z7902 Long term (current) use of antithrombotics/antiplatelets: Secondary | ICD-10-CM | POA: Diagnosis not present

## 2015-10-13 DIAGNOSIS — I2584 Coronary atherosclerosis due to calcified coronary lesion: Secondary | ICD-10-CM | POA: Diagnosis not present

## 2015-10-13 DIAGNOSIS — Z87891 Personal history of nicotine dependence: Secondary | ICD-10-CM | POA: Diagnosis not present

## 2015-10-13 DIAGNOSIS — Z6829 Body mass index (BMI) 29.0-29.9, adult: Secondary | ICD-10-CM | POA: Diagnosis not present

## 2015-10-13 DIAGNOSIS — I2510 Atherosclerotic heart disease of native coronary artery without angina pectoris: Secondary | ICD-10-CM | POA: Insufficient documentation

## 2015-10-13 DIAGNOSIS — Z955 Presence of coronary angioplasty implant and graft: Secondary | ICD-10-CM | POA: Insufficient documentation

## 2015-10-13 DIAGNOSIS — I714 Abdominal aortic aneurysm, without rupture: Secondary | ICD-10-CM | POA: Insufficient documentation

## 2015-10-13 DIAGNOSIS — R9439 Abnormal result of other cardiovascular function study: Secondary | ICD-10-CM | POA: Insufficient documentation

## 2015-10-13 DIAGNOSIS — E785 Hyperlipidemia, unspecified: Secondary | ICD-10-CM | POA: Diagnosis not present

## 2015-10-13 LAB — POCT ACTIVATED CLOTTING TIME: ACTIVATED CLOTTING TIME: 173 s

## 2015-10-13 SURGERY — CORONARY ANGIOGRAM

## 2015-10-13 MED ORDER — HYDRALAZINE HCL 20 MG/ML IJ SOLN
INTRAMUSCULAR | Status: DC | PRN
  Administered 2015-10-13: 10 mg via INTRAVENOUS

## 2015-10-13 MED ORDER — CEFAZOLIN SODIUM 1-5 GM-% IV SOLN
INTRAVENOUS | Status: DC | PRN
  Administered 2015-10-13: 1 g via INTRAVENOUS

## 2015-10-13 MED ORDER — LIDOCAINE HCL (PF) 1 % IJ SOLN
INTRAMUSCULAR | Status: AC
  Filled 2015-10-13: qty 30

## 2015-10-13 MED ORDER — NITROGLYCERIN 1 MG/10 ML FOR IR/CATH LAB
INTRA_ARTERIAL | Status: AC
  Filled 2015-10-13: qty 10

## 2015-10-13 MED ORDER — LIDOCAINE HCL (PF) 1 % IJ SOLN
INTRAMUSCULAR | Status: DC | PRN
  Administered 2015-10-13: 26 mL via INTRADERMAL
  Administered 2015-10-13: 2 mL via INTRADERMAL

## 2015-10-13 MED ORDER — ASPIRIN 81 MG PO CHEW: 81.0000 mg | CHEWABLE_TABLET | ORAL | Status: DC

## 2015-10-13 MED ORDER — HEPARIN SODIUM (PORCINE) 1000 UNIT/ML IJ SOLN
INTRAMUSCULAR | Status: AC
  Filled 2015-10-13: qty 1

## 2015-10-13 MED ORDER — ACETAMINOPHEN 325 MG PO TABS
ORAL_TABLET | ORAL | Status: AC
Start: 2015-10-13 — End: 2015-10-13
  Administered 2015-10-13: 650 mg via ORAL
  Filled 2015-10-13: qty 2

## 2015-10-13 MED ORDER — IOPAMIDOL (ISOVUE-370) INJECTION 76%
INTRAVENOUS | Status: DC | PRN
  Administered 2015-10-13: 90 mL via INTRA_ARTERIAL

## 2015-10-13 MED ORDER — ONDANSETRON HCL 4 MG/2ML IJ SOLN: 4.0000 mg | Freq: Four times a day (QID) | INTRAMUSCULAR | Status: DC | PRN

## 2015-10-13 MED ORDER — HYDRALAZINE HCL 20 MG/ML IJ SOLN: 10.0000 mg | INTRAMUSCULAR | Status: DC | PRN

## 2015-10-13 MED ORDER — SODIUM CHLORIDE 0.9 % WEIGHT BASED INFUSION: 1.0000 mL/kg/h | INTRAVENOUS | Status: DC

## 2015-10-13 MED ORDER — FENTANYL CITRATE (PF) 100 MCG/2ML IJ SOLN
INTRAMUSCULAR | Status: AC
  Filled 2015-10-13: qty 2

## 2015-10-13 MED ORDER — IOPAMIDOL (ISOVUE-370) INJECTION 76%
INTRAVENOUS | Status: AC
  Filled 2015-10-13: qty 100

## 2015-10-13 MED ORDER — CEFAZOLIN SODIUM 1-5 GM-% IV SOLN
INTRAVENOUS | Status: AC
  Filled 2015-10-13: qty 50

## 2015-10-13 MED ORDER — VERAPAMIL HCL 2.5 MG/ML IV SOLN
INTRAVENOUS | Status: AC
  Filled 2015-10-13: qty 2

## 2015-10-13 MED ORDER — HYDRALAZINE HCL 20 MG/ML IJ SOLN
INTRAMUSCULAR | Status: AC
  Filled 2015-10-13: qty 1

## 2015-10-13 MED ORDER — MIDAZOLAM HCL 2 MG/2ML IJ SOLN
INTRAMUSCULAR | Status: AC
  Filled 2015-10-13: qty 2

## 2015-10-13 MED ORDER — HEPARIN (PORCINE) IN NACL 2-0.9 UNIT/ML-% IJ SOLN
INTRAMUSCULAR | Status: AC
  Filled 2015-10-13: qty 1500

## 2015-10-13 MED ORDER — MIDAZOLAM HCL 2 MG/2ML IJ SOLN
INTRAMUSCULAR | Status: DC | PRN
  Administered 2015-10-13: 1 mg via INTRAVENOUS

## 2015-10-13 MED ORDER — VERAPAMIL HCL 2.5 MG/ML IV SOLN
INTRA_ARTERIAL | Status: DC | PRN
  Administered 2015-10-13: 10 mL via INTRA_ARTERIAL
  Administered 2015-10-13: 5 mL via INTRA_ARTERIAL

## 2015-10-13 MED ORDER — SODIUM CHLORIDE 0.9 % WEIGHT BASED INFUSION
3.0000 mL/kg/h | INTRAVENOUS | Status: AC
  Administered 2015-10-13: 3 mL/kg/h via INTRAVENOUS

## 2015-10-13 MED ORDER — SODIUM CHLORIDE 0.9% FLUSH: 3.0000 mL | INTRAVENOUS | Status: DC | PRN

## 2015-10-13 MED ORDER — FENTANYL CITRATE (PF) 100 MCG/2ML IJ SOLN
INTRAMUSCULAR | Status: DC | PRN
  Administered 2015-10-13: 25 ug via INTRAVENOUS

## 2015-10-13 MED ORDER — HEPARIN SODIUM (PORCINE) 1000 UNIT/ML IJ SOLN
INTRAMUSCULAR | Status: DC | PRN
Start: 2015-10-13 — End: 2015-10-13
  Administered 2015-10-13: 5000 [IU] via INTRAVENOUS

## 2015-10-13 MED ORDER — HEPARIN (PORCINE) IN NACL 2-0.9 UNIT/ML-% IJ SOLN
INTRAMUSCULAR | Status: DC | PRN
  Administered 2015-10-13: 15:00:00

## 2015-10-13 MED ORDER — ACETAMINOPHEN 325 MG PO TABS
650.0000 mg | ORAL_TABLET | ORAL | Status: DC | PRN
  Administered 2015-10-13: 650 mg via ORAL

## 2015-10-13 MED ORDER — SODIUM CHLORIDE 0.9 % IV SOLN: INTRAVENOUS | Status: AC

## 2015-10-13 SURGICAL SUPPLY — 14 items
CATH INFINITI 5FR ANG PIGTAIL (CATHETERS) ×3 IMPLANT
CATH INFINITI 5FR JR4 125CM (CATHETERS) ×3 IMPLANT
CATH INFINITI JR4 5F (CATHETERS) ×3 IMPLANT
CATH OPTITORQUE TIG 4.0 5F (CATHETERS) ×3 IMPLANT
DEVICE RAD COMP TR BAND LRG (VASCULAR PRODUCTS) ×3 IMPLANT
GLIDESHEATH SLEND A-KIT 6F 22G (SHEATH) ×3 IMPLANT
KIT HEART LEFT (KITS) ×4 IMPLANT
PACK CARDIAC CATHETERIZATION (CUSTOM PROCEDURE TRAY) ×4 IMPLANT
SHEATH PINNACLE 5F 10CM (SHEATH) ×3 IMPLANT
TRANSDUCER W/STOPCOCK (MISCELLANEOUS) ×4 IMPLANT
TUBING CIL FLEX 10 FLL-RA (TUBING) ×4 IMPLANT
WIRE EMERALD 3MM-J .035X260CM (WIRE) ×6 IMPLANT
WIRE HI TORQ VERSACORE-J 145CM (WIRE) ×3 IMPLANT
WIRE SAFE-T 1.5MM-J .035X260CM (WIRE) ×3 IMPLANT

## 2015-10-13 NOTE — Progress Notes (Signed)
Site area: rt groin Site Prior to Removal:  Level 0 Pressure Applied For:  25 minutes Manual:   yes Patient Status During Pull:  stable Post Pull Site:  Level  0 Post Pull Instructions Given:  yes Post Pull Pulses Present:  yes Dressing Applied:  tegaderm Bedrest begins @  1600 Comments:

## 2015-10-13 NOTE — Discharge Instructions (Signed)
Angiogram, Care After °Refer to this sheet in the next few weeks. These instructions provide you with information about caring for yourself after your procedure. Your health care provider may also give you more specific instructions. Your treatment has been planned according to current medical practices, but problems sometimes occur. Call your health care provider if you have any problems or questions after your procedure. °WHAT TO EXPECT AFTER THE PROCEDURE °After your procedure, it is typical to have the following: °· Bruising at the catheter insertion site that usually fades within 1-2 weeks. °· Blood collecting in the tissue (hematoma) that may be painful to the touch. It should usually decrease in size and tenderness within 1-2 weeks. °HOME CARE INSTRUCTIONS °· Take medicines only as directed by your health care provider. °· You may shower 24-48 hours after the procedure or as directed by your health care provider. Remove the bandage (dressing) and gently wash the site with plain soap and water. Pat the area dry with a clean towel. Do not rub the site, because this may cause bleeding. °· Do not take baths, swim, or use a hot tub until your health care provider approves. °· Check your insertion site every day for redness, swelling, or drainage. °· Do not apply powder or lotion to the site. °· Do not lift over 10 lb (4.5 kg) for 5 days after your procedure or as directed by your health care provider. °· Ask your health care provider when it is okay to: °¨ Return to work or school. °¨ Resume usual physical activities or sports. °¨ Resume sexual activity. °· Do not drive home if you are discharged the same day as the procedure. Have someone else drive you. °· You may drive 24 hours after the procedure unless otherwise instructed by your health care provider. °· Do not operate machinery or power tools for 24 hours after the procedure or as directed by your health care provider. °· If your procedure was done as an  outpatient procedure, which means that you went home the same day as your procedure, a responsible adult should be with you for the first 24 hours after you arrive home. °· Keep all follow-up visits as directed by your health care provider. This is important. °SEEK MEDICAL CARE IF: °· You have a fever. °· You have chills. °· You have increased bleeding from the catheter insertion site. Hold pressure on the site.  CALL 911 °SEEK IMMEDIATE MEDICAL CARE IF: °· You have unusual pain at the catheter insertion site. °· You have redness, warmth, or swelling at the catheter insertion site. °· You have drainage (other than a small amount of blood on the dressing) from the catheter insertion site. °· The catheter insertion site is bleeding, and the bleeding does not stop after 30 minutes of holding steady pressure on the site. °· The area near or just beyond the catheter insertion site becomes pale, cool, tingly, or numb. °  °This information is not intended to replace advice given to you by your health care provider. Make sure you discuss any questions you have with your health care provider. °  °Document Released: 12/28/2004 Document Revised: 07/02/2014 Document Reviewed: 11/12/2012 °Elsevier Interactive Patient Education ©2016 [employer] ° °

## 2015-10-13 NOTE — Interval H&P Note (Signed)
Cath Lab Visit (complete for each Cath Lab visit)  Clinical Evaluation Leading to the Procedure:   ACS: No.  Non-ACS:    Anginal Classification: No Symptoms  Anti-ischemic medical therapy: Minimal Therapy (1 class of medications)  Non-Invasive Test Results: High-risk stress test findings: cardiac mortality >3%/year  Prior CABG: No previous CABG      History and Physical Interval Note:  10/13/2015 1:59 PM  [name]  has presented today for surgery, with the diagnosis of abnormal nuclear stress test  The various methods of treatment have been discussed with the patient and family. After consideration of risks, benefits and other options for treatment, the patient has consented to  Procedure(s): Left Heart Cath and Coronary Angiography (N/A) as a surgical intervention .  The patient's history has been reviewed, patient examined, no change in status, stable for surgery.  I have reviewed the patient's chart and labs.  Questions were answered to the patient's satisfaction.     Nanetta BattyBerry, Nilton Lave

## 2015-10-13 NOTE — H&P (View-Only) (Signed)
10/11/2015 [name]   Oct 21, 1940  [id number]  Primary Physician Pcp Not In System Primary Cardiologist: Runell Gess MD Roseanne Reno   HPI:  The patient is a 75year-old mildly overweight married Caucasian male father of 2, grandfather to 3 grandchildren who I last saw 12/31/14.Marland Kitchen He was previously a patient of Dr. Pierre Bali who had done LAD and diagonal branch stenting June 08, 2004. He had normal LV function at that time. He had re-stenting at [facility] subsequent to that. He also had an Endoluminal stent graft placed at [facility] in 2006 for an abdominal aortic aneurysm. He was last ultrasounded 2 years ago and had no evidence of endoleak. His other problems include hypertension and hyperlipidemia. He denies chest pain or shortness of breath.when I saw him a year ago he was complaining of some lower extremity discomfort with ambulation. Dopplers performed 01/01/14 revealed no evidence of obstructive disease. He apparently needs back surgery. A pharmacologic Myoview stress test done for preoperative clearance was read as high risk with severe anteroseptal and apical ischemia.   Current Outpatient Prescriptions  Medication Sig Dispense Refill  . amLODipine (NORVASC) 10 MG tablet Take 1 tablet (10 mg total) by mouth daily. 90 tablet 3  . aspirin 325 MG tablet Take 325 mg by mouth daily.    . clopidogrel (PLAVIX) 75 MG tablet TAKE 1 TABLET BY MOUTH EVERY DAY 90 tablet 3  . metoprolol tartrate (LOPRESSOR) 25 MG tablet Take 1 tablet (25 mg total) by mouth daily. 90 tablet 3  . pantoprazole (PROTONIX) 40 MG tablet Take 1 tablet (40 mg total) by mouth daily. 90 tablet 3  . Vitamin D, Cholecalciferol, 1000 units CAPS Take 1 tablet by mouth daily.     No current facility-administered medications for this visit.    Allergies  Allergen Reactions  . Crestor [Rosuvastatin]   . Lipitor [Atorvastatin]     Social History   Social History  . Marital Status: Married   Spouse Name: N/A  . Number of Children: N/A  . Years of Education: N/A   Occupational History  . Not on file.   Social History Main Topics  . Smoking status: Former Games developer  . Smokeless tobacco: Not on file     Comment: smoked in his teenage years  . Alcohol Use: Not on file  . Drug Use: Not on file  . Sexual Activity: Not on file   Other Topics Concern  . Not on file   Social History Narrative     Review of Systems: General: negative for chills, fever, night sweats or weight changes.  Cardiovascular: negative for chest pain, dyspnea on exertion, edema, orthopnea, palpitations, paroxysmal nocturnal dyspnea or shortness of breath Dermatological: negative for rash Respiratory: negative for cough or wheezing Urologic: negative for hematuria Abdominal: negative for nausea, vomiting, diarrhea, bright red blood per rectum, melena, or hematemesis Neurologic: negative for visual changes, syncope, or dizziness All other systems reviewed and are otherwise negative except as noted above.    Blood pressure 138/64, pulse 42, height  (1.88 m), weight 229 lb 8 oz (104.101 kg), SpO2 90 %.  General appearance: alert and no distress Neck: no adenopathy, no carotid bruit, no JVD, supple, symmetrical, trachea midline and thyroid not enlarged, symmetric, no tenderness/mass/nodules Lungs: clear to auscultation bilaterally Heart: regular rate and rhythm, S1, S2 normal, no murmur, click, rub or gallop Extremities: extremities normal, atraumatic, no cyanosis or edema  EKG sinus rhythm at 73 with evidence of LV  voltage and PACs. I personally reviewed his EKG  ASSESSMENT AND PLAN:   HYPERTENSION, BENIGN SYSTEMIC History of hypertension blood pressure measures 138/64. He is on amlodipine and metoprolol. Continue current meds at current dose  Hyperlipidemia History of hyperlipidemia not on statin therapy  Coronary atherosclerosis History of the status post LAD diagonal branch stent 06/08/04  by Dr. Orvan Falconerampbell with normal LV function at that time. He [facility]  [facility] subsequent quit that. He had a Myoview stress test test done for preoperative clearance/4/17 which was high risk suggesting ischemia in the LAD distribution. He denies chest pain. He will need diagnostic coronary angiography via the right radial approach to further evaluate this.  Aortic aneurysm History of abdominal aortic aneurysm status post [facility]uminal stent [facility]. Ultrasound performed 10/06/12 showed an intact endograft without obvious endoleak.      Runell GessJonathan J. Ewin Rehberg MD FACP,FACC,FAHA, [facility] [place] [facility] 10/11/2015 2:01 PM

## 2015-10-18 NOTE — Telephone Encounter (Addendum)
Per Verbal Orders: Pt is cleared at low risk for his procedure.  Per Protocol: Pt can HOLD Plavix for 7 days prior to proacedure  Clearance faxed to [facility] Att. Dr. Shelle IronBeane.

## 2015-10-24 ENCOUNTER — Ambulatory Visit: Payer: Self-pay | Admitting: Orthopedic Surgery

## 2015-10-24 NOTE — H&P (Signed)
[name] is an 75 y.o. male.   Chief Complaint: Right lower extremity pain and numbness, weakness in the legs HPI: The patient is a 75 year old male who presents with back pain. The patient is here today in referral from Cletis Athens, FNP with [facility]. The patient reports low back symptoms including pain which began year(s) ago without any known injury. Symptoms are reported to be located in the low back and Symptoms include numbness (right lower leg and foot) and weakness. The patient describes the pain as sharp. Symptoms are exacerbated by standing, while symptoms are not exacerbated by sitting. Current treatment includes heating pad. Prior to being seen today the patient was previously evaluated [facility]. Past evaluation has included x-ray of the lumbar spine and MRI of the lumbar spine (and EMG/NCV BLE). Past treatment has included heating pad and Bayer back and body and over the counter topical cream.  HISTORY This is a pleasant gentleman who is kindly referred here with his sister-in-law. He sees Dr. Marina Goodell who is a cardiologist. He has had long problems with his back and legs. 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 There is an aorto-bi-iliac stent graft with artifact.  Four-view radiographs outside of the lumbar spine indicated advanced lumbar spondylosis, aortic stents.  Dr. 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 Worse with again when he sits. It essentially reduces the symptoms. When he is ambulating, essentially becomes disabling. Overall, no chest pains, fevers or chills, change in bowel or bladder function, pain that awakens him at  night, or unexplained recent weight loss.  Past Medical History  Diagnosis Date  . Abdominal aortic aneurysm [facility])     status post endoluminal stent graft at [facility] in 2006  . Coronary artery disease   . Hypertension   . Hyperlipidemia   . Abdominal aortic aneurysm (HCC) 2006    Past Surgical History  Procedure Laterality Date  . Transthoracic echocardiogram  06/07/2004  . Cardiovascular stress test  02/14/2009  . Cardiac catheterization  06/08/2004  . Branch stenting  06/08/2004    Dr. Lavonne Chick  . Angioplasty      Family History  Problem Relation Age of Onset  . Alzheimer's disease Sister   . Heart failure Maternal Grandmother   . Heart failure Maternal Grandfather   . Heart failure Paternal Grandmother   . Heart failure Paternal Grandfather   . Cancer Sister    Social History:  reports that he has quit smoking. He does not have any smokeless tobacco history on file. His alcohol and drug histories are not on file.  Allergies:  Allergies  Allergen Reactions  . Crestor [Rosuvastatin] Other (See Comments)    Severe pain  . Lipitor [Atorvastatin] Other (See Comments)    Severe pain     (Not in a hospital admission)  No results found for this or any previous visit (from the past 48 hour(s)). No results found.  Review of Systems  Constitutional: Negative.   HENT: Negative.   Eyes: Negative.   Respiratory: Negative.   Cardiovascular: Negative.   Gastrointestinal: Negative.   Genitourinary: Negative.   Musculoskeletal: Positive for back pain.  Skin: Negative.   Neurological: Positive for sensory change  and focal weakness.    There were no vitals taken for this visit. Physical Exam  Constitutional: He is oriented to person, place, and time. He appears well-developed.  HENT:  Head: Normocephalic.  Eyes: Pupils are equal, round, and reactive to light.  Neck: Normal range of motion.  Cardiovascular: Normal rate.   Respiratory: Effort normal.  GI:  Soft.  Musculoskeletal:  On exam, he is sitting in a seated position, in no distress and standing. He walks with an antalgic gait. Straight leg raise is buttock and thigh pain on the right, negative on the left. He has diminished quad and plantarflexion strength, 1+ peripheral pulses. No DVT. Pain with extension and flexion of the lumbar spine.  Lumbar spine exam reveals no evidence of soft tissue swelling, deformity or skin ecchymosis. On palpation there is no tenderness of the lumbar spine. No flank pain with percussion. The abdomen is soft and nontender. Nontender over the trochanters. No cellulitis or lymphadenopathy.  Motor is 5/5 including EHL, tibialis anterior, plantar flexion, quadriceps and hamstrings. Patient is normoreflexic. There is no Babinski or clonus. Sensory exam is intact to light touch. Patient has good distal pulses. No DVT. No pain and normal range of motion without instability of the hips, knees and ankles.  Neurological: He is alert and oriented to person, place, and time.    Three-view radiographs, AP, lateral, flexion and extension demonstrates multilevel lumbar spondylosis, stent grafts aorto-bi-iliac which are noted. Hips are unremarkable.  Outside MRI; severe stenosis multifactorial at L3-4 and at L4-5 .At L5-S1, there is some associated lateral recess stenosis and facet arthropathy and moderate stenosis at L5-S1.  EMG nerve conduction study reviewed as well.   Assessment/Plan 1. 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 2. History of aortic aneurysm, status post stenting, currently on Plavix.  I had an extensive discussion with Mr. Witherington concerning his current pathology, relevant anatomy, and treatment options. He reports considerable significant negative affect of his activities of daily living including  ambulation. Given the severity of his stenosis, it is reasonable to consider lumbar decompression at minimum two levels.  I had an extensive discussion of the risks and benefits of the lumbar decompression with the patient including bleeding, infection, damage to neurovascular structures, epidural fibrosis, CSF leak requiring repair. We also discussed increase in pain, adjacent segment disease, recurrent disc herniation, need for future surgery including repeat decompression and/or fusion. We also discussed risks of postoperative hematoma, paralysis, anesthetic complications including DVT, PE, death, cardiopulmonary dysfunction. In addition, the perioperative and postoperative courses were discussed in detail including the rehabilitative time and return to functional activity and work. I provided the patient with an illustrated handout and utilized the appropriate surgical models.  I am concerned, however, with his comorbidities and we would request kindly a risk assessment and operative clearance by his medical physician and cardiologist, Dr. Marina Goodell; and his medical physician who is through Cletis Athens, at [facility], Dr. Blenda Nicely. I will call him with the results once that have been obtained. He is fairly symptomatic. Though we would have to come off his Plavix and aspirin for five days prior to any surgical decompression and resume it five days post. He has quit smoking at this point in time. In the interim, he is to avoid extension and favor flexion. He is to continue with a home exercise program with Williams flexion. I do not  feel epidural steroid injections will be of any benefit given the severity of his spinal stenosis. He is to continue using the cane.  Plan microlumbar decompression L3-4, L4-5   BISSELL, Dayna BarkerJACLYN M., PA-C for Dr. Shelle IronBeane 10/24/2015, 8:15 AM

## 2015-10-26 ENCOUNTER — Encounter (HOSPITAL_COMMUNITY): Payer: Self-pay

## 2015-10-26 ENCOUNTER — Ambulatory Visit (HOSPITAL_COMMUNITY)
Admission: RE | Admit: 2015-10-26 | Discharge: 2015-10-26 | Disposition: A | Discharge status: Home / Self Care | Payer: Medicare Other | Source: Ambulatory Visit | Attending: Orthopedic Surgery | Admitting: Orthopedic Surgery

## 2015-10-26 ENCOUNTER — Encounter (HOSPITAL_COMMUNITY)
Admission: RE | Admit: 2015-10-26 | Discharge: 2015-10-26 | Disposition: A | Discharge status: Home / Self Care | Payer: Medicare Other | Source: Ambulatory Visit | Attending: Specialist | Admitting: Specialist

## 2015-10-26 DIAGNOSIS — M4806 Spinal stenosis, lumbar region: Secondary | ICD-10-CM | POA: Diagnosis present

## 2015-10-26 DIAGNOSIS — M5136 Other intervertebral disc degeneration, lumbar region: Secondary | ICD-10-CM | POA: Insufficient documentation

## 2015-10-26 DIAGNOSIS — M48061 Spinal stenosis, lumbar region without neurogenic claudication: Secondary | ICD-10-CM

## 2015-10-26 HISTORY — DX: Sleep apnea, unspecified: G47.30

## 2015-10-26 HISTORY — DX: Dizziness and giddiness: R42

## 2015-10-26 HISTORY — DX: Gastro-esophageal reflux disease without esophagitis: K21.9

## 2015-10-26 HISTORY — DX: Anesthesia of skin: R20.0

## 2015-10-26 HISTORY — DX: Other specified postprocedural states: Z98.890

## 2015-10-26 HISTORY — DX: Methicillin resistant Staphylococcus aureus infection, unspecified site: A49.02

## 2015-10-26 HISTORY — DX: Other specified postprocedural states: R11.2

## 2015-10-26 HISTORY — DX: Personal history of other infectious and parasitic diseases: Z86.19

## 2015-10-26 HISTORY — DX: Unspecified osteoarthritis, unspecified site: M19.90

## 2015-10-26 LAB — BASIC METABOLIC PANEL
ANION GAP: 10 (ref 5–15)
BUN: 15 mg/dL (ref 6–20)
CHLORIDE: 104 mmol/L (ref 101–111)
CO2: 26 mmol/L (ref 22–32)
Calcium: 9.3 mg/dL (ref 8.9–10.3)
Creatinine, Ser: 1.15 mg/dL (ref 0.61–1.24)
GFR calc Af Amer: >60 mL/min (ref >60)
GLUCOSE: 100 mg/dL — AB (ref 65–99)
POTASSIUM: 4.4 mmol/L (ref 3.5–5.1)
Sodium: 140 mmol/L (ref 135–145)

## 2015-10-26 LAB — CBC
HCT: 44.3 % (ref 39.0–52.0)
HEMOGLOBIN: 15.4 g/dL (ref 13.0–17.0)
MCH: 30.1 pg (ref 26.0–34.0)
MCHC: 34.8 g/dL (ref 30.0–36.0)
MCV: 86.5 fL (ref 78.0–100.0)
PLATELETS: 188 10*3/uL (ref 150–400)
RBC: 5.12 MIL/uL (ref 4.22–5.81)
RDW: 13.7 % (ref 11.5–15.5)
WBC: 7.1 10*3/uL (ref 4.0–10.5)

## 2015-10-26 LAB — ABO/RH: ABO/RH(D): O POS

## 2015-10-26 LAB — SURGICAL PCR SCREEN
MRSA, PCR: NEGATIVE
STAPHYLOCOCCUS AUREUS: NEGATIVE

## 2015-10-26 NOTE — Progress Notes (Signed)
Your patient has screened at an elevated risk for Obstructive Sleep Apnea using the Stop-Bang Tool during a pre-surgical visit. Patient scored at high risk.  

## 2015-10-26 NOTE — Patient Instructions (Signed)
Gavin ReaperJames Werner  10/26/2015   Your procedure is scheduled on: Wednesday Nov 02, 2015  [facility]  Entrance [place]vators to 3rd floor to  Short Stay Center at 6:30 AM.  Call this number if you have problems the morning of surgery [phone]   Remember: ONLY 1 PERSON MAY GO WITH YOU TO SHORT STAY TO GET  READY MORNING OF YOUR SURGERY.  Do not eat food or drink liquids :After Midnight.     Take these medicines the morning of surgery with A SIP OF WATER: Amlodipine (Norvasc); Metoprolol, Pantoprazole (Protonix)                                You may not have any metal on your body including hair pins and              piercings  Do not wear jewelry,  lotions, powders or colognes, deodorant                         Men may shave face and neck.   Do not bring valuables to the [facility]. [name] IS NOT             RESPONSIBLE   FOR VALUABLES.  Contacts, dentures or bridgework may not be worn into surgery.  Leave suitcase in the car. After surgery it may be brought to your room.              Please read over the following fact sheets you were given:INCENTIVE SPIROMETER; MRSA INFORMATION SHEET  _____________________________________________________________________             [facility] - Preparing for Surgery Before surgery, you can play an important role.  Because skin is not sterile, your skin needs to be as free of germs as possible.  You can reduce the number of germs on your skin by washing with CHG (chlorahexidine gluconate) soap before surgery.  CHG is an antiseptic cleaner which kills germs and bonds with the skin to continue killing germs even after washing. Please DO NOT use if you have an allergy to CHG or antibacterial soaps.  If your skin becomes reddened/irritated stop using the CHG and inform your nurse when you arrive at Short Stay. Do not shave (including legs and underarms) for at least 48 hours prior to the first CHG shower.   You may shave your face/neck. Please follow these instructions carefully:  1.  Shower with CHG Soap the night before surgery and the  morning of Surgery.  2.  If you choose to wash your hair, wash your hair first as usual with your  normal  shampoo.  3.  After you shampoo, rinse your hair and body thoroughly to remove the  shampoo.                           4.  Use CHG as you would any other liquid soap.  You can apply chg directly  to the skin and wash                       Gently with a scrungie or clean washcloth.  5.  Apply the CHG Soap to your body ONLY FROM THE NECK DOWN.  Do not use on face/ open                           Wound or open sores. Avoid contact with eyes, ears mouth and genitals (private parts).                       Wash face,  Genitals (private parts) with your normal soap.             6.  Wash thoroughly, paying special attention to the area where your surgery  will be performed.  7.  Thoroughly rinse your body with warm water from the neck down.  8.  DO NOT shower/wash with your normal soap after using and rinsing off  the CHG Soap.                9.  Pat yourself dry with a clean towel.            10.  Wear clean pajamas.            11.  Place clean sheets on your bed the night of your first shower and do not  sleep with pets. Day of Surgery : Do not apply any lotions/deodorants the morning of surgery.  Please wear clean clothes to the [facility]/surgery center.  FAILURE TO FOLLOW THESE INSTRUCTIONS MAY RESULT IN THE CANCELLATION OF YOUR SURGERY PATIENT SIGNATURE_________________________________  NURSE SIGNATURE__________________________________  ________________________________________________________________________   Adam Phenix  An incentive spirometer is a tool that can help keep your lungs clear and active. This tool measures how well you are filling your lungs with each breath. Taking long deep breaths may help reverse or decrease the chance of  developing breathing (pulmonary) problems (especially infection) following:  A long period of time when you are unable to move or be active. BEFORE THE PROCEDURE   If the spirometer includes an indicator to show your best effort, your nurse or respiratory therapist will set it to a desired goal.  If possible, sit up straight or lean slightly forward. Try not to slouch.  Hold the incentive spirometer in an upright position. INSTRUCTIONS FOR USE  1. Sit on the edge of your bed if possible, or sit up as far as you can in bed or on a chair. 2. Hold the incentive spirometer in an upright position. 3. Breathe out normally. 4. Place the mouthpiece in your mouth and seal your lips tightly around it. 5. Breathe in slowly and as deeply as possible, raising the piston or the ball toward the top of the column. 6. Hold your breath for 3-5 seconds or for as long as possible. Allow the piston or ball to fall to the bottom of the column. 7. Remove the mouthpiece from your mouth and breathe out normally. 8. Rest for a few seconds and repeat Steps 1 through 7 at least 10 times every 1-2 hours when you are awake. Take your time and take a few normal breaths between deep breaths. 9. The spirometer may include an indicator to show your best effort. Use the indicator as a goal to work toward during each repetition. 10. After each set of 10 deep breaths, practice coughing to be sure your lungs are clear. If you have an incision (the cut made at the time of surgery), support your incision when coughing by placing a pillow or rolled up towels firmly against it. Once you are able to get out of  bed, walk around indoors and cough well. You may stop using the incentive spirometer when instructed by your caregiver.  RISKS AND COMPLICATIONS  Take your time so you do not get dizzy or light-headed.  If you are in pain, you may need to take or ask for pain medication before doing incentive spirometry. It is harder to take a  deep breath if you are having pain. AFTER USE  Rest and breathe slowly and easily.  It can be helpful to keep track of a log of your progress. Your caregiver can provide you with a simple table to help with this. If you are using the spirometer at home, follow these instructions: [place] IF:   You are having difficultly using the spirometer.  You have trouble using the spirometer as often as instructed.  Your pain medication is not giving enough relief while using the spirometer.  You develop fever of 100.5 F (38.1 C) or higher. SEEK IMMEDIATE MEDICAL CARE IF:   You cough up bloody sputum that had not been present before.  You develop fever of 102 F (38.9 C) or greater.  You develop worsening pain at or near the incision site. MAKE SURE YOU:   Understand these instructions.  Will watch your condition.  Will get help right away if you are not doing well or get worse. Document Released: 10/22/2006 Document Revised: 09/03/2011 Document Reviewed: 12/23/2006 [facility] Patient Information 2014 [place], [place].   ________________________________________________________________________

## 2015-11-02 ENCOUNTER — Ambulatory Visit (HOSPITAL_COMMUNITY)
Admission: RE | Admit: 2015-11-02 | Discharge: 2015-11-03 | Disposition: A | Discharge status: Home / Self Care | Payer: Medicare Other | Source: Ambulatory Visit | Attending: Specialist | Admitting: Specialist

## 2015-11-02 ENCOUNTER — Ambulatory Visit (HOSPITAL_COMMUNITY): Payer: Medicare Other | Admitting: Certified Registered Nurse Anesthetist

## 2015-11-02 ENCOUNTER — Ambulatory Visit (HOSPITAL_COMMUNITY): Payer: Medicare Other

## 2015-11-02 ENCOUNTER — Encounter (HOSPITAL_COMMUNITY): Payer: Self-pay | Admitting: *Deleted

## 2015-11-02 ENCOUNTER — Encounter (HOSPITAL_COMMUNITY)
Admission: RE | Disposition: A | Discharge status: Home / Self Care | Payer: Self-pay | Source: Ambulatory Visit | Attending: Specialist

## 2015-11-02 DIAGNOSIS — G4730 Sleep apnea, unspecified: Secondary | ICD-10-CM | POA: Diagnosis not present

## 2015-11-02 DIAGNOSIS — M4806 Spinal stenosis, lumbar region: Secondary | ICD-10-CM | POA: Diagnosis present

## 2015-11-02 DIAGNOSIS — Z87891 Personal history of nicotine dependence: Secondary | ICD-10-CM | POA: Diagnosis not present

## 2015-11-02 DIAGNOSIS — M79604 Pain in right leg: Secondary | ICD-10-CM | POA: Diagnosis not present

## 2015-11-02 DIAGNOSIS — E785 Hyperlipidemia, unspecified: Secondary | ICD-10-CM | POA: Diagnosis not present

## 2015-11-02 DIAGNOSIS — I10 Essential (primary) hypertension: Secondary | ICD-10-CM | POA: Diagnosis not present

## 2015-11-02 DIAGNOSIS — Z955 Presence of coronary angioplasty implant and graft: Secondary | ICD-10-CM | POA: Insufficient documentation

## 2015-11-02 DIAGNOSIS — M48061 Spinal stenosis, lumbar region without neurogenic claudication: Secondary | ICD-10-CM

## 2015-11-02 DIAGNOSIS — I2510 Atherosclerotic heart disease of native coronary artery without angina pectoris: Secondary | ICD-10-CM | POA: Diagnosis not present

## 2015-11-02 DIAGNOSIS — Z419 Encounter for procedure for purposes other than remedying health state, unspecified: Secondary | ICD-10-CM

## 2015-11-02 HISTORY — PX: LUMBAR LAMINECTOMY/DECOMPRESSION MICRODISCECTOMY: SHX5026

## 2015-11-02 LAB — TYPE AND SCREEN
ABO/RH(D): O POS
ANTIBODY SCREEN: NEGATIVE

## 2015-11-02 LAB — GLUCOSE, CAPILLARY: Glucose-Capillary: 153 mg/dL — ABNORMAL HIGH (ref 65–99)

## 2015-11-02 LAB — PROTIME-INR
INR: 1.02 (ref 0.00–1.49)
Prothrombin Time: 13.6 seconds (ref 11.6–15.2)

## 2015-11-02 SURGERY — LUMBAR LAMINECTOMY/DECOMPRESSION MICRODISCECTOMY 1 LEVEL
Anesthesia: General | Site: Back

## 2015-11-02 MED ORDER — METHOCARBAMOL 500 MG PO TABS: 500.0000 mg | ORAL_TABLET | Freq: Four times a day (QID) | ORAL | Status: DC | PRN

## 2015-11-02 MED ORDER — EPHEDRINE SULFATE 50 MG/ML IJ SOLN
INTRAMUSCULAR | Status: DC | PRN
  Administered 2015-11-02: 5 mg via INTRAVENOUS
  Administered 2015-11-02 (×2): 10 mg via INTRAVENOUS

## 2015-11-02 MED ORDER — POLYETHYLENE GLYCOL 3350 17 G PO PACK: 17.0000 g | PACK | Freq: Every day | ORAL | Status: DC | PRN

## 2015-11-02 MED ORDER — DOCUSATE SODIUM 100 MG PO CAPS: 100.0000 mg | ORAL_CAPSULE | Freq: Two times a day (BID) | ORAL | Status: DC | PRN

## 2015-11-02 MED ORDER — KCL IN DEXTROSE-NACL 20-5-0.45 MEQ/L-%-% IV SOLN
INTRAVENOUS | Status: AC
  Administered 2015-11-02: 16:00:00 via INTRAVENOUS
  Filled 2015-11-02 (×2): qty 1000

## 2015-11-02 MED ORDER — SODIUM CHLORIDE 0.9 % IR SOLN
Status: AC
  Filled 2015-11-02: qty 1

## 2015-11-02 MED ORDER — PHENYLEPHRINE HCL 10 MG/ML IJ SOLN
INTRAMUSCULAR | Status: DC | PRN
  Administered 2015-11-02: 40 ug via INTRAVENOUS
  Administered 2015-11-02: 120 ug via INTRAVENOUS
  Administered 2015-11-02: 80 ug via INTRAVENOUS
  Administered 2015-11-02 (×2): 120 ug via INTRAVENOUS
  Administered 2015-11-02: 80 ug via INTRAVENOUS
  Administered 2015-11-02: 40 ug via INTRAVENOUS
  Administered 2015-11-02: 80 ug via INTRAVENOUS

## 2015-11-02 MED ORDER — ROCURONIUM BROMIDE 50 MG/5ML IV SOLN
INTRAVENOUS | Status: AC
  Filled 2015-11-02: qty 1

## 2015-11-02 MED ORDER — ALUM & MAG HYDROXIDE-SIMETH 200-200-20 MG/5ML PO SUSP: 30.0000 mL | Freq: Four times a day (QID) | ORAL | Status: DC | PRN

## 2015-11-02 MED ORDER — SODIUM CHLORIDE 0.9 % IR SOLN
Status: DC | PRN
  Administered 2015-11-02: 500 mL

## 2015-11-02 MED ORDER — MENTHOL 3 MG MT LOZG: 1.0000 | LOZENGE | OROMUCOSAL | Status: DC | PRN

## 2015-11-02 MED ORDER — LIDOCAINE HCL (CARDIAC) 20 MG/ML IV SOLN
INTRAVENOUS | Status: DC | PRN
  Administered 2015-11-02: 100 mg via INTRAVENOUS

## 2015-11-02 MED ORDER — METOCLOPRAMIDE HCL 5 MG/ML IJ SOLN: 10.0000 mg | Freq: Once | INTRAMUSCULAR | Status: DC | PRN

## 2015-11-02 MED ORDER — AMLODIPINE BESYLATE 10 MG PO TABS
10.0000 mg | ORAL_TABLET | Freq: Every day | ORAL | Status: DC
  Administered 2015-11-03: 10 mg via ORAL
  Filled 2015-11-02: qty 1

## 2015-11-02 MED ORDER — RISAQUAD PO CAPS
1.0000 | ORAL_CAPSULE | Freq: Every day | ORAL | Status: DC
Start: 2015-11-02 — End: 2015-11-03
  Administered 2015-11-02 – 2015-11-03 (×2): 1 via ORAL
  Filled 2015-11-02 (×2): qty 1

## 2015-11-02 MED ORDER — FENTANYL CITRATE (PF) 100 MCG/2ML IJ SOLN: 25.0000 ug | INTRAMUSCULAR | Status: DC | PRN

## 2015-11-02 MED ORDER — SUGAMMADEX SODIUM 200 MG/2ML IV SOLN
INTRAVENOUS | Status: DC | PRN
  Administered 2015-11-02: 200 mg via INTRAVENOUS

## 2015-11-02 MED ORDER — DOCUSATE SODIUM 100 MG PO CAPS
100.0000 mg | ORAL_CAPSULE | Freq: Two times a day (BID) | ORAL | Status: DC
  Administered 2015-11-03: 100 mg via ORAL
  Filled 2015-11-02: qty 1

## 2015-11-02 MED ORDER — SUCCINYLCHOLINE CHLORIDE 20 MG/ML IJ SOLN
INTRAMUSCULAR | Status: DC | PRN
  Administered 2015-11-02: 100 mg via INTRAVENOUS

## 2015-11-02 MED ORDER — FENTANYL CITRATE (PF) 100 MCG/2ML IJ SOLN
INTRAMUSCULAR | Status: DC | PRN
  Administered 2015-11-02 (×5): 50 ug via INTRAVENOUS

## 2015-11-02 MED ORDER — GELATIN ABSORBABLE 12-7 MM EX MISC
CUTANEOUS | Status: DC | PRN
  Administered 2015-11-02: 1 via TOPICAL

## 2015-11-02 MED ORDER — OXYCODONE-ACETAMINOPHEN 5-325 MG PO TABS: 1.0000 | ORAL_TABLET | ORAL | Status: DC | PRN

## 2015-11-02 MED ORDER — BISACODYL 5 MG PO TBEC: 5.0000 mg | DELAYED_RELEASE_TABLET | Freq: Every day | ORAL | Status: DC | PRN

## 2015-11-02 MED ORDER — METHOCARBAMOL 1000 MG/10ML IJ SOLN
500.0000 mg | Freq: Four times a day (QID) | INTRAVENOUS | Status: DC | PRN
  Filled 2015-11-02: qty 5

## 2015-11-02 MED ORDER — ONDANSETRON HCL 4 MG/2ML IJ SOLN
4.0000 mg | INTRAMUSCULAR | Status: DC | PRN
  Administered 2015-11-02: 4 mg via INTRAVENOUS
  Filled 2015-11-02: qty 2

## 2015-11-02 MED ORDER — LACTATED RINGERS IV SOLN
INTRAVENOUS | Status: DC
  Administered 2015-11-02 (×3): via INTRAVENOUS

## 2015-11-02 MED ORDER — PHENYLEPHRINE 40 MCG/ML (10ML) SYRINGE FOR IV PUSH (FOR BLOOD PRESSURE SUPPORT)
PREFILLED_SYRINGE | INTRAVENOUS | Status: AC
  Filled 2015-11-02: qty 10

## 2015-11-02 MED ORDER — DEXAMETHASONE SODIUM PHOSPHATE 10 MG/ML IJ SOLN
INTRAMUSCULAR | Status: AC
  Filled 2015-11-02: qty 1

## 2015-11-02 MED ORDER — DEXAMETHASONE SODIUM PHOSPHATE 10 MG/ML IJ SOLN
INTRAMUSCULAR | Status: DC | PRN
  Administered 2015-11-02: 10 mg via INTRAVENOUS

## 2015-11-02 MED ORDER — LIDOCAINE HCL (CARDIAC) 20 MG/ML IV SOLN
INTRAVENOUS | Status: AC
  Filled 2015-11-02: qty 5

## 2015-11-02 MED ORDER — ROCURONIUM BROMIDE 100 MG/10ML IV SOLN
INTRAVENOUS | Status: DC | PRN
  Administered 2015-11-02: 5 mg via INTRAVENOUS
  Administered 2015-11-02: 10 mg via INTRAVENOUS
  Administered 2015-11-02: 5 mg via INTRAVENOUS
  Administered 2015-11-02: 10 mg via INTRAVENOUS
  Administered 2015-11-02: 5 mg via INTRAVENOUS
  Administered 2015-11-02: 40 mg via INTRAVENOUS

## 2015-11-02 MED ORDER — PROPOFOL 10 MG/ML IV BOLUS
INTRAVENOUS | Status: AC
  Filled 2015-11-02: qty 20

## 2015-11-02 MED ORDER — MAGNESIUM CITRATE PO SOLN: 1.0000 | Freq: Once | ORAL | Status: DC | PRN

## 2015-11-02 MED ORDER — HYDROCODONE-ACETAMINOPHEN 5-325 MG PO TABS
1.0000 | ORAL_TABLET | ORAL | Status: DC | PRN
  Administered 2015-11-02: 1 via ORAL
  Administered 2015-11-02 – 2015-11-03 (×3): 2 via ORAL
  Filled 2015-11-02 (×3): qty 2
  Filled 2015-11-02: qty 1

## 2015-11-02 MED ORDER — MEPERIDINE HCL 50 MG/ML IJ SOLN: 6.2500 mg | INTRAMUSCULAR | Status: DC | PRN

## 2015-11-02 MED ORDER — BUPIVACAINE-EPINEPHRINE (PF) 0.5% -1:200000 IJ SOLN
INTRAMUSCULAR | Status: AC
  Filled 2015-11-02: qty 30

## 2015-11-02 MED ORDER — METOPROLOL TARTRATE 25 MG PO TABS
25.0000 mg | ORAL_TABLET | Freq: Every day | ORAL | Status: DC
  Administered 2015-11-03: 25 mg via ORAL
  Filled 2015-11-02: qty 1

## 2015-11-02 MED ORDER — PHENOL 1.4 % MT LIQD
1.0000 | OROMUCOSAL | Status: DC | PRN
  Filled 2015-11-02: qty 177

## 2015-11-02 MED ORDER — HYDROCODONE-ACETAMINOPHEN 5-325 MG PO TABS: 1.0000 | ORAL_TABLET | ORAL | Status: DC | PRN

## 2015-11-02 MED ORDER — ACETAMINOPHEN 325 MG PO TABS: 650.0000 mg | ORAL_TABLET | ORAL | Status: DC | PRN

## 2015-11-02 MED ORDER — CEFAZOLIN SODIUM-DEXTROSE 2-4 GM/100ML-% IV SOLN
2.0000 g | Freq: Three times a day (TID) | INTRAVENOUS | Status: AC
  Administered 2015-11-02 – 2015-11-03 (×3): 2 g via INTRAVENOUS
  Filled 2015-11-02 (×3): qty 100

## 2015-11-02 MED ORDER — CEFAZOLIN SODIUM-DEXTROSE 2-4 GM/100ML-% IV SOLN
INTRAVENOUS | Status: AC
  Filled 2015-11-02: qty 100

## 2015-11-02 MED ORDER — ONDANSETRON HCL 4 MG/2ML IJ SOLN
INTRAMUSCULAR | Status: DC | PRN
  Administered 2015-11-02: 4 mg via INTRAVENOUS

## 2015-11-02 MED ORDER — PANTOPRAZOLE SODIUM 40 MG PO TBEC
40.0000 mg | DELAYED_RELEASE_TABLET | Freq: Every day | ORAL | Status: DC
Start: 2015-11-03 — End: 2015-11-03
  Administered 2015-11-03: 40 mg via ORAL
  Filled 2015-11-02: qty 1

## 2015-11-02 MED ORDER — BUPIVACAINE-EPINEPHRINE 0.5% -1:200000 IJ SOLN
INTRAMUSCULAR | Status: DC | PRN
  Administered 2015-11-02: 15 mL

## 2015-11-02 MED ORDER — ONDANSETRON HCL 4 MG/2ML IJ SOLN
INTRAMUSCULAR | Status: AC
  Filled 2015-11-02: qty 2

## 2015-11-02 MED ORDER — CEFAZOLIN SODIUM-DEXTROSE 2-4 GM/100ML-% IV SOLN
2.0000 g | INTRAVENOUS | Status: AC
  Administered 2015-11-02: 2 g via INTRAVENOUS

## 2015-11-02 MED ORDER — PROPOFOL 10 MG/ML IV BOLUS
INTRAVENOUS | Status: DC | PRN
  Administered 2015-11-02: 160 mg via INTRAVENOUS

## 2015-11-02 MED ORDER — FENTANYL CITRATE (PF) 250 MCG/5ML IJ SOLN
INTRAMUSCULAR | Status: AC
  Filled 2015-11-02: qty 5

## 2015-11-02 MED ORDER — SUGAMMADEX SODIUM 200 MG/2ML IV SOLN
INTRAVENOUS | Status: AC
  Filled 2015-11-02: qty 2

## 2015-11-02 MED ORDER — HYDROMORPHONE HCL 1 MG/ML IJ SOLN: 0.5000 mg | INTRAMUSCULAR | Status: DC | PRN

## 2015-11-02 MED ORDER — PHENYLEPHRINE 40 MCG/ML (10ML) SYRINGE FOR IV PUSH (FOR BLOOD PRESSURE SUPPORT)
PREFILLED_SYRINGE | INTRAVENOUS | Status: AC
  Filled 2015-11-02: qty 20

## 2015-11-02 MED ORDER — ACETAMINOPHEN 650 MG RE SUPP: 650.0000 mg | RECTAL | Status: DC | PRN

## 2015-11-02 SURGICAL SUPPLY — 56 items
AGENT HMST SPONGE THK3/8 (HEMOSTASIS) ×1
BAG SPEC THK2 15X12 ZIP CLS (MISCELLANEOUS) ×1
BAG ZIPLOCK 12X15 (MISCELLANEOUS) ×2 IMPLANT
CLEANER TIP ELECTROSURG 2X2 (MISCELLANEOUS) ×3 IMPLANT
CLOSURE WOUND 1/2 X4 (GAUZE/BANDAGES/DRESSINGS) ×1
CLOTH 2% CHLOROHEXIDINE 3PK (PERSONAL CARE ITEMS) ×3 IMPLANT
DRAPE MICROSCOPE LEICA (MISCELLANEOUS) ×3 IMPLANT
DRAPE POUCH INSTRU U-SHP 10X18 (DRAPES) ×3 IMPLANT
DRAPE SHEET LG 3/4 BI-LAMINATE (DRAPES) ×3 IMPLANT
DRAPE SURG 17X11 SM STRL (DRAPES) ×3 IMPLANT
DRAPE UTILITY XL STRL (DRAPES) ×3 IMPLANT
DRESSING AQUACEL AG SP 3.5X6 (GAUZE/BANDAGES/DRESSINGS) IMPLANT
DRSG AQUACEL AG ADV 3.5X 4 (GAUZE/BANDAGES/DRESSINGS) IMPLANT
DRSG AQUACEL AG ADV 3.5X 6 (GAUZE/BANDAGES/DRESSINGS) IMPLANT
DRSG AQUACEL AG SP 3.5X6 (GAUZE/BANDAGES/DRESSINGS) ×3
DURAPREP 26ML APPLICATOR (WOUND CARE) ×3 IMPLANT
ELECT BLADE TIP CTD 4 INCH (ELECTRODE) IMPLANT
ELECT REM PT RETURN 9FT ADLT (ELECTROSURGICAL) ×3
ELECTRODE REM PT RTRN 9FT ADLT (ELECTROSURGICAL) ×1 IMPLANT
GLOVE BIOGEL PI IND STRL 7.0 (GLOVE) ×1 IMPLANT
GLOVE BIOGEL PI IND STRL 7.5 (GLOVE) IMPLANT
GLOVE BIOGEL PI INDICATOR 7.0 (GLOVE) ×2
GLOVE BIOGEL PI INDICATOR 7.5 (GLOVE) ×4
GLOVE SURG SS PI 7.0 STRL IVOR (GLOVE) ×3 IMPLANT
GLOVE SURG SS PI 7.5 STRL IVOR (GLOVE) ×7 IMPLANT
GLOVE SURG SS PI 8.0 STRL IVOR (GLOVE) ×6 IMPLANT
GOWN STRL REUS W/TWL XL LVL3 (GOWN DISPOSABLE) ×8 IMPLANT
HEMOSTAT SPONGE AVITENE ULTRA (HEMOSTASIS) ×2 IMPLANT
IV CATH 14GX2 1/4 (CATHETERS) ×3 IMPLANT
KIT BASIN OR (CUSTOM PROCEDURE TRAY) ×3 IMPLANT
KIT POSITIONING SURG ANDREWS (MISCELLANEOUS) ×3 IMPLANT
MANIFOLD NEPTUNE II (INSTRUMENTS) ×3 IMPLANT
NDL SPNL 18GX3.5 QUINCKE PK (NEEDLE) ×2 IMPLANT
NEEDLE SPNL 18GX3.5 QUINCKE PK (NEEDLE) ×6 IMPLANT
PACK LAMINECTOMY ORTHO (CUSTOM PROCEDURE TRAY) ×3 IMPLANT
PATTIES SURGICAL .5 X.5 (GAUZE/BANDAGES/DRESSINGS) ×2 IMPLANT
PATTIES SURGICAL .75X.75 (GAUZE/BANDAGES/DRESSINGS) ×2 IMPLANT
RUBBERBAND STERILE (MISCELLANEOUS) ×6 IMPLANT
SPONGE LAP 4X18 X RAY DECT (DISPOSABLE) ×2 IMPLANT
STAPLER VISISTAT (STAPLE) ×2 IMPLANT
STRIP CLOSURE SKIN 1/2X4 (GAUZE/BANDAGES/DRESSINGS) ×2 IMPLANT
SUT NURALON 4 0 TR CR/8 (SUTURE) IMPLANT
SUT PROLENE 3 0 PS 2 (SUTURE) ×3 IMPLANT
SUT VIC AB 1 CT1 27 (SUTURE)
SUT VIC AB 1 CT1 27XBRD ANTBC (SUTURE) IMPLANT
SUT VIC AB 1 CT1 36 (SUTURE) ×2 IMPLANT
SUT VIC AB 1-0 CT2 27 (SUTURE) ×3 IMPLANT
SUT VIC AB 2-0 CT1 27 (SUTURE) ×3
SUT VIC AB 2-0 CT1 TAPERPNT 27 (SUTURE) IMPLANT
SUT VIC AB 2-0 CT2 27 (SUTURE) ×3 IMPLANT
SUT VLOC 180 0 24IN GS25 (SUTURE) ×2 IMPLANT
SYR 3ML LL SCALE MARK (SYRINGE) IMPLANT
TOWEL OR 17X26 10 PK STRL BLUE (TOWEL DISPOSABLE) ×3 IMPLANT
TOWEL OR NON WOVEN STRL DISP B (DISPOSABLE) ×2 IMPLANT
TRAY FOLEY CATH 16FRSI W/METER (SET/KITS/TRAYS/PACK) ×2 IMPLANT
YANKAUER SUCT BULB TIP NO VENT (SUCTIONS) ×2 IMPLANT

## 2015-11-02 NOTE — H&P (View-Only) (Signed)
[name] is an 75 y.o. male.   Chief Complaint: Right lower extremity pain and numbness, weakness in the legs HPI: The patient is a 75 year old male who presents with back pain. The patient is here today in referral from Cletis Athens, FNP with [facility]. The patient reports low back symptoms including pain which began year(s) ago without any known injury. Symptoms are reported to be located in the low back and Symptoms include numbness (right lower leg and foot) and weakness. The patient describes the pain as sharp. Symptoms are exacerbated by standing, while symptoms are not exacerbated by sitting. Current treatment includes heating pad. Prior to being seen today the patient was previously evaluated [facility]. Past evaluation has included x-ray of the lumbar spine and MRI of the lumbar spine (and EMG/NCV BLE). Past treatment has included heating pad and Bayer back and body and over the counter topical cream.  HISTORY This is a pleasant gentleman who is kindly referred here with his sister-in-law. He sees Dr. Marina Goodell who is a cardiologist. He has had long problems with his back and legs. 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 There is an aorto-bi-iliac stent graft with artifact.  Four-view radiographs outside of the lumbar spine indicated advanced lumbar spondylosis, aortic stents.  Dr. 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 Worse with again when he sits. It essentially reduces the symptoms. When he is ambulating, essentially becomes disabling. Overall, no chest pains, fevers or chills, change in bowel or bladder function, pain that awakens him at  night, or unexplained recent weight loss.  Past Medical History  Diagnosis Date  . Abdominal aortic aneurysm [facility])     status post endoluminal stent graft at [facility] in 2006  . Coronary artery disease   . Hypertension   . Hyperlipidemia   . Abdominal aortic aneurysm (HCC) 2006    Past Surgical History  Procedure Laterality Date  . Transthoracic echocardiogram  06/07/2004  . Cardiovascular stress test  02/14/2009  . Cardiac catheterization  06/08/2004  . Branch stenting  06/08/2004    Dr. Lavonne Chick  . Angioplasty      Family History  Problem Relation Age of Onset  . Alzheimer's disease Sister   . Heart failure Maternal Grandmother   . Heart failure Maternal Grandfather   . Heart failure Paternal Grandmother   . Heart failure Paternal Grandfather   . Cancer Sister    Social History:  reports that he has quit smoking. He does not have any smokeless tobacco history on file. His alcohol and drug histories are not on file.  Allergies:  Allergies  Allergen Reactions  . Crestor [Rosuvastatin] Other (See Comments)    Severe pain  . Lipitor [Atorvastatin] Other (See Comments)    Severe pain     (Not in a hospital admission)  No results found for this or any previous visit (from the past 48 hour(s)). No results found.  Review of Systems  Constitutional: Negative.   HENT: Negative.   Eyes: Negative.   Respiratory: Negative.   Cardiovascular: Negative.   Gastrointestinal: Negative.   Genitourinary: Negative.   Musculoskeletal: Positive for back pain.  Skin: Negative.   Neurological: Positive for sensory change  and focal weakness.    There were no vitals taken for this visit. Physical Exam  Constitutional: He is oriented to person, place, and time. He appears well-developed.  HENT:  Head: Normocephalic.  Eyes: Pupils are equal, round, and reactive to light.  Neck: Normal range of motion.  Cardiovascular: Normal rate.   Respiratory: Effort normal.  GI:  Soft.  Musculoskeletal:  On exam, he is sitting in a seated position, in no distress and standing. He walks with an antalgic gait. Straight leg raise is buttock and thigh pain on the right, negative on the left. He has diminished quad and plantarflexion strength, 1+ peripheral pulses. No DVT. Pain with extension and flexion of the lumbar spine.  Lumbar spine exam reveals no evidence of soft tissue swelling, deformity or skin ecchymosis. On palpation there is no tenderness of the lumbar spine. No flank pain with percussion. The abdomen is soft and nontender. Nontender over the trochanters. No cellulitis or lymphadenopathy.  Motor is 5/5 including EHL, tibialis anterior, plantar flexion, quadriceps and hamstrings. Patient is normoreflexic. There is no Babinski or clonus. Sensory exam is intact to light touch. Patient has good distal pulses. No DVT. No pain and normal range of motion without instability of the hips, knees and ankles.  Neurological: He is alert and oriented to person, place, and time.    Three-view radiographs, AP, lateral, flexion and extension demonstrates multilevel lumbar spondylosis, stent grafts aorto-bi-iliac which are noted. Hips are unremarkable.  Outside MRI; severe stenosis multifactorial at L3-4 and at L4-5 .At L5-S1, there is some associated lateral recess stenosis and facet arthropathy and moderate stenosis at L5-S1.  EMG nerve conduction study reviewed as well.   Assessment/Plan 1. 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 2. History of aortic aneurysm, status post stenting, currently on Plavix.  I had an extensive discussion with [name] concerning his current pathology, relevant anatomy, and treatment options. He reports considerable significant negative affect of his activities of daily living including  ambulation. Given the severity of his stenosis, it is reasonable to consider lumbar decompression at minimum two levels.  I had an extensive discussion of the risks and benefits of the lumbar decompression with the patient including bleeding, infection, damage to neurovascular structures, epidural fibrosis, CSF leak requiring repair. We also discussed increase in pain, adjacent segment disease, recurrent disc herniation, need for future surgery including repeat decompression and/or fusion. We also discussed risks of postoperative hematoma, paralysis, anesthetic complications including DVT, PE, death, cardiopulmonary dysfunction. In addition, the perioperative and postoperative courses were discussed in detail including the rehabilitative time and return to functional activity and work. I provided the patient with an illustrated handout and utilized the appropriate surgical models.  I am concerned, however, with his comorbidities and we would request kindly a risk assessment and operative clearance by his medical physician and cardiologist, Dr. Marina Goodell; and his medical physician who is through Cletis Athens, at [facility], Dr. Blenda Nicely. I will call him with the results once that have been obtained. He is fairly symptomatic. Though we would have to come off his Plavix and aspirin for five days prior to any surgical decompression and resume it five days post. He has quit smoking at this point in time. In the interim, he is to avoid extension and favor flexion. He is to continue with a home exercise program with Williams flexion. I do not  feel epidural steroid injections will be of any benefit given the severity of his spinal stenosis. He is to continue using the cane.  Plan microlumbar decompression L3-4, L4-5   Malaki Koury, Dayna BarkerJACLYN M., PA-C for Dr. Shelle IronBeane 10/24/2015, 8:15 AM

## 2015-11-02 NOTE — Brief Op Note (Addendum)
11/02/2015  11:11 AM  PATIENT:  [name]  75 y.o. male  PRE-OPERATIVE DIAGNOSIS:  SPINAL STENOSIS L3-L5  POST-OPERATIVE DIAGNOSIS:  SPINAL STENOSIS L3-L5  PROCEDURE:  Procedure(s): MICROLUMBAR DECOMPRESSION L3-L4, L4-L5, AND L5-S1 (N/A)  SURGEON:  Surgeon(s) and Role:    * Jene EveryJeffrey Ibraheem Voris, MD - Primary  PHYSICIAN ASSISTANT:   ASSISTANTS: Bissell   ANESTHESIA:   general  EBL:  Total I/O In: 2000 [I.V.:2000] Out: 450 [Urine:200; Blood:250]  BLOOD ADMINISTERED:none  DRAINS: none   LOCAL MEDICATIONS USED:  MARCAINE     SPECIMEN:  No Specimen  DISPOSITION OF SPECIMEN:  N/A  COUNTS:  YES  TOURNIQUET:  * No tourniquets in log *  DICTATION: .Other Dictation: Dictation Number  Did not give me a number  redictated number. [id number].  PLAN OF CARE: Admit for overnight observation  PATIENT DISPOSITION:  PACU - hemodynamically stable.   Delay start of Pharmacological VTE agent (>24hrs) due to surgical blood loss or risk of bleeding: yes

## 2015-11-02 NOTE — Anesthesia Procedure Notes (Signed)
Procedure Name: Intubation Date/Time: 11/02/2015 8:34 AM Performed by: Epimenio SarinJARVELA, Anothony Bursch R Pre-anesthesia Checklist: Patient identified, Emergency Drugs available, Suction available, Patient being monitored and Timeout performed Patient Re-evaluated:Patient Re-evaluated prior to inductionOxygen Delivery Method: Circle system utilized Preoxygenation: Pre-oxygenation with 100% oxygen Intubation Type: IV induction Laryngoscope Size: Mac and 3 Grade View: Grade II Tube type: Oral Tube size: 7.5 mm Number of attempts: 1 Airway Equipment and Method: Stylet Placement Confirmation: ETT inserted through vocal cords under direct vision,  positive ETCO2 and breath sounds checked- equal and bilateral Secured at: 23 cm Tube secured with: Tape Dental Injury: Teeth and Oropharynx as per pre-operative assessment  Comments: Loose tooth intact after intubation.

## 2015-11-02 NOTE — Anesthesia Preprocedure Evaluation (Addendum)
Anesthesia Evaluation  Patient identified by MRN, date of birth, ID band Patient awake    Reviewed: Allergy & Precautions, NPO status , Patient's Chart, lab work & pertinent test results  History of Anesthesia Complications (+) PONV  Airway Mallampati: II  TM Distance: >3 FB Neck ROM: Full    Dental no notable dental hx. (+) Missing   Pulmonary neg pulmonary ROS,    Pulmonary exam normal breath sounds clear to auscultation       Cardiovascular hypertension, + CAD (50-60% RCA), + Cardiac Stents and + Peripheral Vascular Disease (AAA s/p endovascular stent)  Normal cardiovascular exam Rhythm:Regular Rate:Normal     Neuro/Psych negative neurological ROS  negative psych ROS   GI/Hepatic negative GI ROS, Neg liver ROS,   Endo/Other  negative endocrine ROS  Renal/GU negative Renal ROS  negative genitourinary   Musculoskeletal negative musculoskeletal ROS (+)   Abdominal   Peds negative pediatric ROS (+)  Hematology negative hematology ROS (+)   Anesthesia Other Findings   Reproductive/Obstetrics negative OB ROS                            Anesthesia Physical Anesthesia Plan  ASA: III  Anesthesia Plan: General   Post-op Pain Management:    Induction: Intravenous  Airway Management Planned: Oral ETT  Additional Equipment:   Intra-op Plan:   Post-operative Plan: Extubation in OR  Informed Consent: I have reviewed the patients History and Physical, chart, labs and discussed the procedure including the risks, benefits and alternatives for the proposed anesthesia with the patient or authorized representative who has indicated his/her understanding and acceptance.   Dental advisory given  Plan Discussed with: CRNA  Anesthesia Plan Comments:         Anesthesia Quick Evaluation

## 2015-11-02 NOTE — Transfer of Care (Signed)
Immediate Anesthesia Transfer of Care Note  Patient: Earleen ReaperJames Splinter  Procedure(s) Performed: Procedure(s): MICROLUMBAR DECOMPRESSION L3-L4, L4-L5, AND L5-S1 (N/A)  Patient Location: PACU  Anesthesia Type:General  Level of Consciousness:  sedated, patient cooperative and responds to stimulation  Airway & Oxygen Therapy:Patient Spontanous Breathing and Patient connected to face mask oxgen  Post-op Assessment:  Report given to PACU RN and Post -op Vital signs reviewed and stable  Post vital signs:  Reviewed and stable  Last Vitals:  Filed Vitals:   11/02/15 0626  BP: 154/75  Pulse: 66  Temp: 36.9 C  Resp: 16    Complications: No apparent anesthesia complications

## 2015-11-02 NOTE — Anesthesia Postprocedure Evaluation (Signed)
Anesthesia Post Note  Patient: [name]  Procedure(s) Performed: Procedure(s) (LRB): MICROLUMBAR DECOMPRESSION L3-L4, L4-L5, AND L5-S1 (N/A)  Patient location during evaluation: PACU Anesthesia Type: General Level of consciousness: awake and alert Pain management: pain level controlled Vital Signs Assessment: post-procedure vital signs reviewed and stable Respiratory status: spontaneous breathing, nonlabored ventilation, respiratory function stable and patient connected to nasal cannula oxygen Cardiovascular status: blood pressure returned to baseline and stable Postop Assessment: no signs of nausea or vomiting Anesthetic complications: no    Last Vitals:  Filed Vitals:   11/02/15 1315 11/02/15 1330  BP: 112/63 122/62  Pulse: 64 66  Temp: 36.6 C 36.6 C  Resp: 16 16    Last Pain:  Filed Vitals:   11/02/15 1332  PainSc: 0-No pain                 Phillips Groutarignan, Shoua Ressler

## 2015-11-02 NOTE — Interval H&P Note (Signed)
History and Physical Interval Note:  11/02/2015 8:22 AM  [name]  has presented today for surgery, with the diagnosis of SPINAL STENOSIS L3-L5  The various methods of treatment have been discussed with the patient and family. After consideration of risks, benefits and other options for treatment, the patient has consented to  Procedure(s): DECOMPRESSION L3-L5 (N/A) as a surgical intervention .  The patient's history has been reviewed, patient examined, no change in status, stable for surgery.  I have reviewed the patient's chart and labs.  Questions were answered to the patient's satisfaction.     Carroll Ranney C

## 2015-11-03 DIAGNOSIS — M4806 Spinal stenosis, lumbar region: Secondary | ICD-10-CM | POA: Diagnosis not present

## 2015-11-03 LAB — CBC
HCT: 38.8 % — ABNORMAL LOW (ref 39.0–52.0)
HEMOGLOBIN: 13.2 g/dL (ref 13.0–17.0)
MCH: 29.9 pg (ref 26.0–34.0)
MCHC: 34 g/dL (ref 30.0–36.0)
MCV: 87.8 fL (ref 78.0–100.0)
PLATELETS: 146 10*3/uL — AB (ref 150–400)
RBC: 4.42 MIL/uL (ref 4.22–5.81)
RDW: 13.8 % (ref 11.5–15.5)
WBC: 14.2 10*3/uL — AB (ref 4.0–10.5)

## 2015-11-03 LAB — BASIC METABOLIC PANEL
ANION GAP: 11 (ref 5–15)
BUN: 19 mg/dL (ref 6–20)
CALCIUM: 9 mg/dL (ref 8.9–10.3)
CHLORIDE: 103 mmol/L (ref 101–111)
CO2: 26 mmol/L (ref 22–32)
CREATININE: 1.15 mg/dL (ref 0.61–1.24)
GFR calc non Af Amer: >60 mL/min (ref >60)
Glucose, Bld: 159 mg/dL — ABNORMAL HIGH (ref 65–99)
Potassium: 4.1 mmol/L (ref 3.5–5.1)
SODIUM: 140 mmol/L (ref 135–145)

## 2015-11-03 MED ORDER — CLOPIDOGREL BISULFATE 75 MG PO TABS: 75.0000 mg | ORAL_TABLET | Freq: Every day | ORAL | Status: DC

## 2015-11-03 MED ORDER — ASPIRIN 325 MG PO TBEC: 325.0000 mg | DELAYED_RELEASE_TABLET | Freq: Every day | ORAL | Status: DC

## 2015-11-03 NOTE — Evaluation (Signed)
Physical Therapy One Time Evaluation Patient Details Name: Gavin ReaperJames Werner MRN: [id number] DOB: 15-Sep-1940 Today's Date: 11/03/2015   History of Present Illness  75 yo M adm s/p MICROLUMBAR DECOMPRESSION L3-L4, L4-L5, AND L5-S1   Clinical Impression  Patient evaluated by Physical Therapy with no further acute PT needs identified. All education has been completed and the patient has no further questions. Pt mobilizing well with RW and reports slight pain at incision.  Reviewed back precautions and provided pt with handout. See below for any follow-up Physical Therapy or equipment needs. PT is signing off. Thank you for this referral.     Follow Up Recommendations No PT follow up    Equipment Recommendations  None recommended by PT    Recommendations for Other Services       Precautions / Restrictions Precautions Precautions: Back Precaution Booklet Issued: Yes (comment) Precaution Comments: provided handout and reviewed back precautions Restrictions Weight Bearing Restrictions: No      Mobility  Bed Mobility Overal bed mobility: Needs Assistance Bed Mobility: Rolling;Sidelying to Sit;Sit to Sidelying Rolling: Supervision Sidelying to sit: Supervision     Sit to sidelying: Supervision General bed mobility comments: able to verbalize and demonstrate log roll technique  Transfers Overall transfer level: Needs assistance Equipment used: Rolling walker (2 wheeled) Transfers: Sit to/from Stand Sit to Stand: Min guard;Supervision         General transfer comment: verbal cues for maintaining back precautions  Ambulation/Gait Ambulation/Gait assistance: Min guard;Supervision Ambulation Distance (Feet): 200 Feet Assistive device: Rolling walker (2 wheeled) Gait Pattern/deviations: Step-through pattern;Trunk flexed     General Gait Details: pt presents with kyphotic, forward head posture, reviewed precautions during mobility  Stairs Stairs: Yes Stairs assistance: Min  guard Stair Management: Step to pattern;Forwards;One rail Right Number of Stairs: 4 General stair comments: able to perform with step to pattern, used R rail and HHA, pt reports his son can assist him with steps at home  Wheelchair Mobility    Modified Rankin (Stroke Patients Only)       Balance                                             Pertinent Vitals/Pain Pain Assessment: 0-10 Pain Score: 3  Pain Location: incision Pain Descriptors / Indicators: Sore Pain Intervention(s): Limited activity within patient's tolerance;Monitored during session;Repositioned    Home Living Family/patient expects to be discharged to:: Private residence Living Arrangements: Spouse/significant other;Children Available Help at Discharge: Family Type of Home: House Home Access: Stairs to enter Entrance Stairs-Rails: None Secretary/administratorntrance Stairs-Number of Steps: 2 Home Layout: One level Home Equipment: Environmental consultantWalker - 2 wheels      Prior Function Level of Independence: Independent with assistive device(s)               Hand Dominance        Extremity/Trunk Assessment   Upper Extremity Assessment: Overall WFL for tasks assessed           Lower Extremity Assessment: Overall WFL for tasks assessed (reports R LE numbness presurgery, improved postsurgery)      Cervical / Trunk Assessment: Kyphotic  Communication   Communication: No difficulties  Cognition Arousal/Alertness: Awake/alert Behavior During Therapy: WFL for tasks assessed/performed Overall Cognitive Status: Within Functional Limits for tasks assessed  General Comments      Exercises        Assessment/Plan    PT Assessment Patent does not need any further PT services  PT Diagnosis Difficulty walking   PT Problem List    PT Treatment Interventions     PT Goals (Current goals can be found in the Care Plan section) Acute Rehab PT Goals PT Goal Formulation: All assessment  and education complete, DC therapy    Frequency     Barriers to discharge        Co-evaluation               End of Session   Activity Tolerance: Patient tolerated treatment well Patient left: with call bell/phone within reach;in bed      Functional Assessment Tool Used: clinical judgement Functional Limitation: Mobility: Walking and moving around Mobility: Walking and Moving Around Current Status (Z6109): At least 1 percent but less than 20 percent impaired, limited or restricted Mobility: Walking and Moving Around Goal Status [id number]): 0 percent impaired, limited or restricted Mobility: Walking and Moving Around Discharge Status [phone]): 0 percent impaired, limited or restricted    Time: 9147-8295 PT Time Calculation (min) (ACUTE ONLY): 13 min   Charges:   PT Evaluation $PT Eval Low Complexity: 1 Procedure     PT G Codes:   PT G-Codes **NOT FOR INPATIENT CLASS** Functional Assessment Tool Used: clinical judgement Functional Limitation: Mobility: Walking and moving around Mobility: Walking and Moving Around Current Status (A2130): At least 1 percent but less than 20 percent impaired, limited or restricted Mobility: Walking and Moving Around Goal Status [id number]): 0 percent impaired, limited or restricted Mobility: Walking and Moving Around Discharge Status [phone]): 0 percent impaired, limited or restricted    [name],KATHrine E 11/03/2015, 12:16 PM Zenovia Jarred, PT, DPT 11/03/2015 Pager: [phone]

## 2015-11-03 NOTE — Discharge Summary (Signed)
Physician Discharge Summary   Patient ID: [name] MRN: [id number] DOB/AGE: April 01, 1941 75 y.o.  Admit date: 11/02/2015 Discharge date: 11/03/2015  Primary Diagnosis:   SPINAL STENOSIS L3-L5  Admission Diagnoses:  Past Medical History  Diagnosis Date  . Abdominal aortic aneurysm [facility])     status post endoluminal stent graft at [facility] in 2006  . Coronary artery disease   . Hypertension   . Hyperlipidemia   . Abdominal aortic aneurysm ([place]) 2006  . PONV (postoperative nausea and vomiting)   . Numbness     right leg from knee down worse when standing   . Vertigo   . GERD (gastroesophageal reflux disease)   . Arthritis   . History of shingles     TIMES 2  . MRSA (methicillin resistant Staphylococcus aureus) infection   . Sleep apnea     pt scored 5 per Stop Bang tool at PAT visit 10/26/2015; results sent to Heide Scales NP   Discharge Diagnoses:   Principal Problem:   Spinal stenosis of lumbar region  Procedure:  Procedure(s) (LRB): MICROLUMBAR DECOMPRESSION L3-L4, L4-L5, AND L5-S1 (N/A)   Consults: None  HPI:  see H&P    Laboratory Data: Hospital Outpatient Visit on 10/26/2015  Component Date Value Ref Range Status  . MRSA, PCR 10/26/2015 NEGATIVE  NEGATIVE Final  . Staphylococcus aureus 10/26/2015 NEGATIVE  NEGATIVE Final   Comment:        The Xpert SA Assay (FDA approved for NASAL specimens in patients over 53 years of age), is one component of a comprehensive surveillance program.  Test performance has been validated by [facility] for patients greater than or equal to 48 year old. It is not intended to diagnose infection nor to guide or monitor treatment.   . Sodium 10/26/2015 140  135 - 145 mmol/L Final  . Potassium 10/26/2015 4.4  3.5 - 5.1 mmol/L Final  . Chloride 10/26/2015 104  101 - 111 mmol/L Final  . CO2 10/26/2015 26  22 - 32 mmol/L Final  . Glucose, Bld 10/26/2015 100* 65 - 99 mg/dL Final  . BUN 10/26/2015 15  6 - 20 mg/dL Final    . Creatinine, Ser 10/26/2015 1.15  0.61 - 1.24 mg/dL Final  . Calcium 10/26/2015 9.3  8.9 - 10.3 mg/dL Final  . GFR calc non Af Amer 10/26/2015 >60  >60 mL/min Final  . GFR calc Af Amer 10/26/2015 >60  >60 mL/min Final   Comment: (NOTE) The eGFR has been calculated using the CKD EPI equation. This calculation has not been validated in all clinical situations. eGFR's persistently <60 mL/min signify possible Chronic Kidney Disease.   . Anion gap 10/26/2015 10  5 - 15 Final  . WBC 10/26/2015 7.1  4.0 - 10.5 K/uL Final  . RBC 10/26/2015 5.12  4.22 - 5.81 MIL/uL Final  . Hemoglobin 10/26/2015 15.4  13.0 - 17.0 g/dL Final  . HCT 10/26/2015 44.3  39.0 - 52.0 % Final  . MCV 10/26/2015 86.5  78.0 - 100.0 fL Final  . MCH 10/26/2015 30.1  26.0 - 34.0 pg Final  . MCHC 10/26/2015 34.8  30.0 - 36.0 g/dL Final  . RDW 10/26/2015 13.7  11.5 - 15.5 % Final  . Platelets 10/26/2015 188  150 - 400 K/uL Final  . ABO/RH(D) 10/26/2015 O POS   Final  . Antibody Screen 10/26/2015 NEG   Final  . Sample Expiration 10/26/2015 11/05/2015   Final  . Extend sample reason 10/26/2015 NO TRANSFUSIONS OR  PREGNANCY IN THE PAST 3 MONTHS   Final  . ABO/RH(D) 10/26/2015 O POS   Final    Recent Labs  11/03/15 0416  HGB 13.2    Recent Labs  11/03/15 0416  WBC 14.2*  RBC 4.42  HCT 38.8*  PLT 146*    Recent Labs  11/03/15 0416  NA 140  K 4.1  CL 103  CO2 26  BUN 19  CREATININE 1.15  GLUCOSE 159*  CALCIUM 9.0    Recent Labs  11/02/15 0650  INR 1.02    X-Rays:Dg Chest 2 View  10/11/2015  CLINICAL DATA:  Radial cath-abnormal myoview, CAD, HTN, hx AAA, patient has no chest complaints EXAM: CHEST  2 VIEW COMPARISON:  06/11/2014, 11/19/2007 FINDINGS: Patient has overlapping stents involving the proximal descending thoracic aorta, stable in appearance. There is stable aneurysmal appearance of the aortic arch when compared with prior plain film studies. The aortic arch deviates the trachea towards the  right, also stable in appearance. Coronary stents are visible. The heart is enlarged. No pulmonary edema. There are no focal consolidations or pleural effusions. IVC filter is partially imaged. IMPRESSION: 1. Cardiomegaly without pulmonary edema. 2. Aortic stents appear radiographically stable. Electronically Signed   By: Nolon Nations M.D.   On: 10/11/2015 15:57   Dg Lumbar Spine 2-3 Views  10/26/2015  CLINICAL DATA:  Lumbar stenosis, preoperative exam EXAM: LUMBAR SPINE - 2 VIEW COMPARISON:  07/19/2015, 08/02/2015 FINDINGS: The S1 vertebra is a transitional segment similar to that seen on prior plain film examination. The numbering nomenclature is similar to that used on recent MRI. An aortic stent graft is noted in satisfactory position. Multilevel osteophytic changes are noted within the lumbar spine. No acute abnormality is seen. IMPRESSION: Degenerative change without acute abnormality. Electronically Signed   By: Inez Catalina M.D.   On: 10/26/2015 13:24   Dg Spine Portable 1 View  11/02/2015  CLINICAL DATA:  75 year old male undergoing lumbar surgery. EXAM: PORTABLE SPINE - 1 VIEW COMPARISON:  0901 hours the same day and earlier. FINDINGS: Intraoperative portable cross-table lateral view of the lumbar spine labeled image # 3 at 1045 hours. Cephalad Surgical probe is at the L3 pedicle level. Caudal Surgical probe is at the posterior L5-S1 disc space level. Abdominal aortic endograft re- demonstrated. IMPRESSION: Intraoperative localization as above. Electronically Signed   By: Genevie Ann M.D.   On: 11/02/2015 11:29   Dg Spine Portable 1 View  11/02/2015  CLINICAL DATA:  75 year old male undergoing lumbar surgery. Initial encounter. EXAM: PORTABLE SPINE - 1 VIEW COMPARISON:  0830 hours today. Preoperative lumbar radiographs 10/26/2015. Lumbar MRI 08/02/2015. FINDINGS: Same numbering system as on the 10/26/2015 preoperative radiographs demonstrating a vestigial S1-S2 disc space level but otherwise normal  lumbar segmentation. Large abdominal aortic endograft re- demonstrated. Intraoperative portable cross-table lateral view of the spine labeled image #2 at 0901 hours. Cephalad most surgical clamp is on the L3 spinous process. Middle surgical probe or clamp is at the L4 spinous process level. The most caudal surgical probe or clamp is at the L5-S1 disc space level. IMPRESSION: Intraoperative localization as above. Electronically Signed   By: Genevie Ann M.D.   On: 11/02/2015 09:25   Dg Spine Portable 1 View  11/02/2015  CLINICAL DATA:  Elective surgery. EXAM: PORTABLE SPINE - 1 VIEW COMPARISON:  Oct 26, 2015. FINDINGS: Single intraoperative cross-table lateral projection of the lower lumbar spine demonstrates surgical probes directed toward the posterior spinous processes of L4 and L5. IMPRESSION: Surgical  localization as described above. Electronically Signed   By: Marijo Conception, M.D.   On: 11/02/2015 09:09    EKG: Orders placed or performed during the hospital encounter of 10/13/15  . EKG 12-Lead  . EKG 12-Lead     Hospital Course: Patient was admitted to [facility] and taken to the OR and underwent the above state procedure without complications.  Patient tolerated the procedure well and was later transferred to the recovery room and then to the orthopaedic floor for postoperative care.  They were given PO and IV analgesics for pain control following their surgery.  They were given 24 hours of postoperative antibiotics.   PT was consulted postop to assist with mobility and transfers.  The patient was allowed to be WBAT with therapy and was taught back precautions. Discharge planning was consulted to help with postop disposition and equipment needs.  Patient had a good night on the evening of surgery and started to get up OOB with therapy on day one. Patient was seen in rounds and was ready to go home on day one.  They were given discharge instructions and dressing directions.  They were  instructed on when to follow up in the office with Dr. Tonita Cong.   Diet: Regular diet Activity:WBAT; Lspine precautions Follow-up:in 10-14 days Disposition - Home Discharged Condition: good   Discharge Instructions    Call MD / Call 911    Complete by:  As directed   If you experience chest pain or shortness of breath, CALL 911 and be transported to the hospital emergency room.  If you develope a fever above 101 F, pus (white drainage) or increased drainage or redness at the wound, or calf pain, call your surgeon's office.     Constipation Prevention    Complete by:  As directed   Drink plenty of fluids.  Prune juice may be helpful.  You may use a stool softener, such as Colace (over the counter) 100 mg twice a day.  Use MiraLax (over the counter) for constipation as needed.     Diet - low sodium heart healthy    Complete by:  As directed      Increase activity slowly as tolerated    Complete by:  As directed             Medication List    TAKE these medications        acetaminophen 650 MG CR tablet  Commonly known as:  TYLENOL  Take 1,300 mg by mouth every 8 (eight) hours as needed for pain.     amLODipine 10 MG tablet  Commonly known as:  NORVASC  Take 1 tablet (10 mg total) by mouth daily.     aspirin 325 MG EC tablet  Take 1 tablet (325 mg total) by mouth daily. Start taking 48m aspirin daily same day as discharge from hospital then on post-op day 5 can increase to full strength Aspirin and start Plavix     BIOFREEZE EX  Apply 1 application topically daily as needed (for pain).     clopidogrel 75 MG tablet  Commonly known as:  PLAVIX  Take 1 tablet (75 mg total) by mouth daily. Resume 5 days post-op     docusate sodium 100 MG capsule  Commonly known as:  COLACE  Take 1 capsule (100 mg total) by mouth 2 (two) times daily as needed for mild constipation.     HYDROcodone-acetaminophen 5-325 MG tablet  Commonly known as:  NORCO/VICODIN  Take 1-2 tablets by mouth  every 4 (four) hours as needed for severe pain.     metoprolol tartrate 25 MG tablet  Commonly known as:  LOPRESSOR  Take 1 tablet (25 mg total) by mouth daily.     pantoprazole 40 MG tablet  Commonly known as:  PROTONIX  Take 1 tablet (40 mg total) by mouth daily.     Vitamin D3 2000 units Tabs  Take 2,000 Units by mouth daily.           Follow-up Information    Follow up with BEANE,JEFFREY C, MD In 2 weeks.   Specialty:  Orthopedic Surgery   Why:  For suture removal   Contact information:   [address] [place] [address] [phone]       Signed: Lacie Draft, PA-C Orthopaedic Surgery 11/03/2015, 8:56 AM

## 2015-11-03 NOTE — Progress Notes (Signed)
Chart reviewed, confirmed MD order.  No review required

## 2015-11-03 NOTE — Care Management Note (Signed)
Case Management Note  Patient Details  Name: [name] MRN: [id number] Date of Birth: 1940/09/13  Subjective/Objective:   75 y/o m admitted w/spinal stenosis.                 Action/Plan:d/c home no needs or orders.   Expected Discharge Date:                  Expected Discharge Plan:  Home/Self Care  In-House Referral:     Discharge planning Services  CM Consult  Post Acute Care Choice:    Choice offered to:     DME Arranged:    DME Agency:     HH Arranged:    HH Agency:     Status of Service:  Completed, signed off  Medicare Important Message Given:    Date Medicare IM Given:    Medicare IM give by:    Date Additional Medicare IM Given:    Additional Medicare Important Message give by:     If discussed at Long Length of Stay Meetings, dates discussed:    Additional Comments:  Lanier ClamMahabir, Yomara Toothman, RN 11/03/2015, 9:51 AM

## 2015-11-03 NOTE — Op Note (Signed)
NAMQuitman Livings:  Ash[place], Bodey                 ACCOUNT NO.:  [id number]  MEDICAL RECORD NO.:  [id number]  LOCATION:  1601                         [facility]  PHYSICIAN:  Jene EveryJeffrey Waniya Hoglund, M.D.    DATE OF BIRTH:  [birth year]  DATE OF PROCEDURE:  11/02/2015 DATE OF DISCHARGE:                              OPERATIVE REPORT   PREOPERATIVE DIAGNOSIS:  Severe spinal stenosis, 3-4 and 4-5.  POSTOPERATIVE DIAGNOSIS:  Severe spinal stenosis, 3-4 and 4-5 in addition, L5-S1.  PROCEDURE PERFORMED:  Microlumbar decompression L3-4, L4-5, L5-S1 with foraminotomies of L3, L4, L5 bilaterally.  ANESTHESIA:  General.  ASSISTANT:  Andrez GrimeJaclyn Bissell, PA  HISTORY:  Dictation ended at this point     Jene EveryJeffrey Tamari Busic, M.D.     Cordelia PenJB/MEDQ  D:  11/02/2015  T:  11/03/2015  Job:  [id number]

## 2015-11-03 NOTE — Progress Notes (Signed)
Subjective: 1 Day Post-Op Procedure(s) (LRB): MICROLUMBAR DECOMPRESSION L3-L4, L4-L5, AND L5-S1 (N/A) Patient reports pain as mild.  Leg pain improved. Would like to go home today. No other c/o.  Objective: Vital signs in last 24 hours: Temp:  [97.7 F (36.5 C)-98.7 F (37.1 C)] 98.4 F (36.9 C) (05/11 0518) Pulse Rate:  [62-103] 86 (05/11 0518) Resp:  [13-19] 16 (05/11 0518) BP: (105-145)/(51-81) 130/81 mmHg (05/11 0518) SpO2:  [93 %-100 %] 93 % (05/11 0518)  Intake/Output from previous day: 05/10 0701 - 05/11 0700 In: 3687.5 [P.O.:120; I.V.:3367.5; IV Piggyback:200] Out: 2100 [Urine:1850; Blood:250] Intake/Output this shift: Total I/O In: -  Out: 2 [Urine:1; Stool:1]   Recent Labs  11/03/15 0416  HGB 13.2    Recent Labs  11/03/15 0416  WBC 14.2*  RBC 4.42  HCT 38.8*  PLT 146*    Recent Labs  11/03/15 0416  NA 140  K 4.1  CL 103  CO2 26  BUN 19  CREATININE 1.15  GLUCOSE 159*  CALCIUM 9.0    Recent Labs  11/02/15 0650  INR 1.02    Neurologically intact ABD soft Neurovascular intact Sensation intact distally Intact pulses distally Dorsiflexion/Plantar flexion intact Incision: dressing C/D/I and no drainage No cellulitis present Compartment soft no calf pain or sign of DVT  Assessment/Plan: 1 Day Post-Op Procedure(s) (LRB): MICROLUMBAR DECOMPRESSION L3-L4, L4-L5, AND L5-S1 (N/A) Advance diet Up with therapy D/C IV fluids  D/C home today Discussed Lspine precautions Follow up 10-14 days post-op in office  [name] M. 11/03/2015, 8:52 AM

## 2015-11-03 NOTE — Progress Notes (Signed)
Occupational Therapy Evaluation Patient Details Name: [name] MRN: [id number] DOB: Oct 31, 1940 Today's Date: 11/03/2015    History of Present Illness 75 yo M adm s/p MICROLUMBAR DECOMPRESSION L3-L4, L4-L5, AND L5-S1    Clinical Impression   All OT education completed and pt questions answered. No further OT needs; will sign off.    Follow Up Recommendations  No OT follow up;Supervision/Assistance - 24 hour    Equipment Recommendations  None recommended by OT    Recommendations for Other Services       Precautions / Restrictions Precautions Precautions: Back Precaution Booklet Issued: Yes (comment) Precaution Comments: provided handout and reviewed back precautions Restrictions Weight Bearing Restrictions: No      Mobility Bed Mobility            Transfers                 Balance                                            ADL Overall ADL's : Needs assistance/impaired Eating/Feeding: Independent;Sitting;Bed level   Grooming: Set up;Sitting   Upper Body Bathing: Minimal assitance;Sitting   Lower Body Bathing: Moderate assistance;Sit to/from stand   Upper Body Dressing : Minimal assistance;Sitting   Lower Body Dressing: Moderate assistance;Maximal assistance;Sit to/from stand   Toilet Transfer: Min guard;Comfort height toilet;RW           Functional mobility during ADLs: Min guard;Rolling walker General ADL Comments: Wife to assist with ADLs as needed. Extensive review of tub/shower transfer. Patient verbalized understanding but declined to practice.     Vision     Perception     Praxis      Pertinent Vitals/Pain Pain Assessment: 0-10 Pain Score: 3  Pain Location: incision Pain Descriptors / Indicators: Sore Pain Intervention(s): Limited activity within patient's tolerance;Monitored during session;Repositioned     Hand Dominance     Extremity/Trunk Assessment Upper Extremity Assessment Upper  Extremity Assessment: Overall WFL for tasks assessed   Lower Extremity Assessment Lower Extremity Assessment: Overall WFL for tasks assessed (reports R LE numbness presurgery, improved postsurgery)   Cervical / Trunk Assessment Cervical / Trunk Assessment: Kyphotic   Communication Communication Communication: No difficulties   Cognition Arousal/Alertness: Awake/alert Behavior During Therapy: WFL for tasks assessed/performed Overall Cognitive Status: Within Functional Limits for tasks assessed                     General Comments       Exercises       Shoulder Instructions      Home Living Family/patient expects to be discharged to:: Private residence Living Arrangements: Spouse/significant other;Children Available Help at Discharge: Family Type of Home: House Home Access: Stairs to enter Secretary/administrator of Steps: 2 Entrance Stairs-Rails: None Home Layout: One level     Bathroom Shower/Tub: Chief Strategy Officer: Handicapped height     Home Equipment: Environmental consultant - 2 wheels          Prior Functioning/Environment Level of Independence: Independent with assistive device(s)             OT Diagnosis: Acute pain   OT Problem List: Decreased activity tolerance;Decreased knowledge of use of DME or AE;Decreased knowledge of precautions;Pain   OT Treatment/Interventions:      OT Goals(Current goals can be found in the care plan section) Acute Rehab  OT Goals Patient Stated Goal: home today OT Goal Formulation: All assessment and education complete, DC therapy  OT Frequency:     Barriers to D/C:            Co-evaluation              End of Session Nurse Communication: Mobility status  Activity Tolerance: Patient tolerated treatment well Patient left: in bed;with call bell/phone within reach;with family/visitor present   Time: [id number] OT Time Calculation (min): 13 min Charges:  OT General Charges $OT Visit: 1  Procedure OT Evaluation $OT Eval Low Complexity: 1 Procedure G-Codes: OT G-codes **NOT FOR INPATIENT CLASS** Functional Assessment Tool Used: clinical judgment Functional Limitation: Self care Self Care Current Status ([id number]): At least 20 percent but less than 40 percent impaired, limited or restricted Self Care Goal Status ([id number]): At least 20 percent but less than 40 percent impaired, limited or restricted Self Care Discharge Status [phone]): At least 20 percent but less than 40 percent impaired, limited or restricted  Chadric Kimberley A 11/03/2015, 12:22 PM

## 2015-11-03 NOTE — Discharge Instructions (Signed)
Walk As Tolerated utilizing back precautions.  No bending, twisting, or lifting.  No driving for 2 weeks.   Aquacel dressing may remain in place until follow up. May shower with aquacel dressing in place. If the dressing peels off or becomes saturated, you may remove aquacel dressing and place gauze and tape dressing which should be kept clean and dry and changed daily. Do not remove steri-strips if they are present. See Dr. Shelle IronBeane in office in 10 to 14 days. Begin taking aspirin 81mg  per day the same day you return home from surgery if not allergic to aspirin or on another blood thinner. Start Plavix and switch to full strength Aspirin 325mg  daily 5 days post-op. Walk daily even outside. Use a cane or walker only if necessary. Avoid sitting on soft sofas.

## 2015-11-04 NOTE — Op Note (Signed)
[name], [name] NO.:  [id number]  MEDICAL RECORD NO.:  [id number]  LOCATION:  1601                         FACILITY:  [facility]  PHYSICIAN:  Jene Every, M.D.    DATE OF BIRTH:  31-Jul-1940  DATE OF PROCEDURE:  11/02/2015 DATE OF DISCHARGE:  11/03/2015                              OPERATIVE REPORT   PREOPERATIVE DIAGNOSIS:  Severe spinal stenosis, L3-4 and L4-5.  POSTOPERATIVE DIAGNOSIS:  Severe spinal stenosis, L3-4 and L4-5, and L5- S1.  PROCEDURE PERFORMED:  Microlumbar decompression, L3-4, L4-5, and L5-S1 with central laminectomies of 3 and 4; and bilateral hemilaminotomies at L3-4, L4-5 and L5-S1; and bilateral foraminotomies of L3, L4, L5 and S1.  ANESTHESIA:  General.  ASSISTANT:  Lanna Poche, PA.  HISTORY:  A 75 year old with severe spinal stenosis at 3-4 and 4-5 with neurogenic claudication, particularly right lower extremity radicular pain, quad weakness, refractory to conservative treatment, indicated for decompression at those levels confirmed by MRI.  He also had spinal stenosis extending into 5-1 and he had a sacralized lumbar vertebral body and increased lumbosacral angle.  Risks and benefits were discussed including bleeding, infection, damage to the neurovascular structure, DVT, PE, anesthetic complications, no change in symptoms, worsening symptoms, etc.  TECHNIQUE:  With the patient in supine position, after the induction of adequate general anesthesia, 2 g of Kefzol, placed prone on the [place] frame.  All bony prominences were well padded, Foley to gravity.  Lumbar region was prepped and draped in usual sterile fashion.  Two 18-gauge spinal needles were utilized to localize 3-4 and 4-5 interspace. Incision was made from the spinous process of 3 to below 5. Subcutaneous tissue was dissected.  Electrocautery was utilized to achieve hemostasis.  A 0.25% Marcaine with epinephrine was infiltrated in the paraspinous musculature.   After identified and dividing the dorsolumbar fascia in line with the skin incision, we elevated the paraspinous muscles from 3-4, 4-5 down to 5-1.  Confirmatory radiograph obtained, used a Leksell rongeur to remove the spinous processes of 3, 4 and 5.  Very small interlaminar windows and noted fairly dense, hard bone.  First able to enter with a 2-mm Kerrison in our space at 3-4. So, centrally, we performed a central laminectomy of 3, used a 2-mm Kerrison, severe hypertrophic facets and ligamentum flavum were noted. We continued the central decompression to remove the lamina of 3, placing the neuro patty beneath the neural elements.  We used microcurettes to detach the ligamentum flavum from the cephalad edge of 4.  We continued our decompression and then caudad centrally encountering severe stenosis.  We removed the central lamina of 4 as well and neuro patties beneath the ligamentum flavum, severe lateral recess stenosis was encountered as well bilateral.  We continued our decompression through the lamina of 4, removed the ligamentum flavum from the central portion of 4-5, detached the ligamentum flavum from the cephalad edge of 5.  I then performed the central laminectomy of 3, continuing caudad, encountering stenosis related to shingling and these hypertrophic facets.  Ligamentum flavum was removed from these three interspaces.  We essentially had a confluence of 5 and S1 distally.  We then proceeded to  decompress the lateral recesses bilaterally.  At 3, we decompressed the lateral recesses to the medial border of the pedicle utilizing neuro patties and Woodson retractor to protect the dural sac and nerve roots at all times.  We performed foraminotomies bilaterally at 3 and at 4.  It was particularly stenotic at 4 on the right and the lateral recess and the foramen of 4.  Continued distally and decompressed the lateral recesses at 4-5, again performing foraminotomies of 5, severely  stenotic secondary to the facet hypertrophy.  We undercut the facets bilaterally and then continued distally to remove the lamina down into the space at 5-1 as there was significant ligamentum flavum hypertrophy bilaterally and the lateral recesses were stenotic as well.  We evaluated the disk on both sides of the disk at 3-4, 4-5 and 5-1 without disk herniation noted.  Bipolar electrocautery was utilized to achieve hemostasis as was Gelfoam and electrocautery.  We used Leksell rongeurs to perform partial medial hemifacetectomies.  Essentially, the facets were arthrodesed.  The interlaminar windows were fairly small.  We started both interlaminar windows with a 2-mm Kerrison.  Utilizing the operating microscope and placing neuro patties beneath the ligamentum flavum and meticulously developing the plane between the thecal sac, nerve roots, the overlying facet and ligamentum flavum.  We had good restoration of the thecal sac at all levels.  Severe stenosis, was consistent with that seen on 3-4 and 4-5 and again prudent to remove the neural arches of 3, 4 and 5 due to the shingling, hypertrophic facet and the underlying severe compression.  A neuroprobe passed freely out all foramen following the decompression.  There was no active bleeding.  We copiously irrigated the wound throughout.  We used a confirmatory radiograph to confirm up to the pedicle of 2 to S1.  After that, no evidence of CSF leakage or active bleeding.  Intraoperative Valsalva maneuver to 40 mmHg was performed.  Next, we placed Gelfoam over the laminotomy defects. Meticulously achieved hemostasis.  Bone wax on the cancellous surfaces. Removed the McCullough retractors and copiously irrigated the wound, then without active bleeding.  We reapproximated the dorsolumbar fascia with 1 Vicryl and oversewed of V-Loc with small aperture distally for any drainage, approximately 0.5 cm, subcu with 2-0 and skin with staples.  Wound  was dressed sterilely.  Placed supine on the hospital bed, extubated without difficulty, and transported to the recovery room in satisfactory condition.  The patient tolerated the procedure well.  No complications.  Blood loss 250.  Assistant, Lanna PocheJacqueline Bissell, PA, was used throughout the course to help positioning of the patient, general intermittent neural traction, suction, closure and patient transport.     Jene EveryJeffrey Ersel Wadleigh, M.D.     Cordelia PenJB/MEDQ  D:  11/03/2015  T:  11/04/2015  Job:  [id number]

## 2016-03-09 ENCOUNTER — Other Ambulatory Visit: Payer: Self-pay | Admitting: Cardiovascular Disease

## 2016-03-09 NOTE — Telephone Encounter (Signed)
Rx request sent to pharmacy.  

## 2016-06-26 ENCOUNTER — Telehealth: Payer: Self-pay | Admitting: Cardiovascular Disease

## 2016-06-26 MED ORDER — PANTOPRAZOLE SODIUM 40 MG PO TBEC: 40.0000 mg | DELAYED_RELEASE_TABLET | Freq: Every day | ORAL | 0 refills | Status: DC

## 2016-06-26 MED ORDER — METOPROLOL TARTRATE 25 MG PO TABS: 25.0000 mg | ORAL_TABLET | Freq: Every day | ORAL | 0 refills | Status: DC

## 2016-06-26 MED ORDER — CLOPIDOGREL BISULFATE 75 MG PO TABS: 75.0000 mg | ORAL_TABLET | Freq: Every day | ORAL | 0 refills | Status: DC

## 2016-06-26 MED ORDER — AMLODIPINE BESYLATE 10 MG PO TABS: 10.0000 mg | ORAL_TABLET | Freq: Every day | ORAL | 0 refills | Status: DC

## 2016-06-26 NOTE — Telephone Encounter (Signed)
New message  Pt call requesting to speak with RN about getting a refill on all his medication from Dr. Allyson SabalBerry. Pt states he does not know all the names of medications. Please call back to discuss

## 2016-06-26 NOTE — Telephone Encounter (Signed)
Spoke with patient. He is aware he needs an OV. He states he comes in annually. He did not follow up after his cath in spring 2017. Recall placed for April 2018. Rx(s) sent to pharmacy electronically.

## 2016-10-02 ENCOUNTER — Telehealth: Payer: Self-pay | Admitting: Cardiovascular Disease

## 2016-10-03 MED ORDER — PANTOPRAZOLE SODIUM 40 MG PO TBEC: 40.0000 mg | DELAYED_RELEASE_TABLET | Freq: Every day | ORAL | 0 refills | Status: DC

## 2016-10-03 MED ORDER — CLOPIDOGREL BISULFATE 75 MG PO TABS: 75.0000 mg | ORAL_TABLET | Freq: Every day | ORAL | 0 refills | Status: DC

## 2016-10-03 MED ORDER — METOPROLOL TARTRATE 25 MG PO TABS: 25.0000 mg | ORAL_TABLET | Freq: Every day | ORAL | 0 refills | Status: DC

## 2016-10-03 MED ORDER — AMLODIPINE BESYLATE 10 MG PO TABS: 10.0000 mg | ORAL_TABLET | Freq: Every day | ORAL | 0 refills | Status: DC

## 2016-10-03 NOTE — Telephone Encounter (Signed)
Rx(s) sent to pharmacy electronically.  

## 2016-10-03 NOTE — Telephone Encounter (Signed)
°*  STAT* If patient is at the pharmacy, call can be transferred to refill team.   1. Which medications need to be refilled? (please list name of each medication and dose if known) amlodipine , clopidogrel 75, pantoprazole  & metoporolol tartrate   2. Which pharmacy/location (including street and city if local pharmacy) is medication to be sent to? [facility] [phone]  3. Do they need a 30 day or 90 day supply? 90

## 2016-10-24 ENCOUNTER — Encounter: Payer: Self-pay | Admitting: Cardiovascular Disease

## 2016-10-24 ENCOUNTER — Ambulatory Visit (INDEPENDENT_AMBULATORY_CARE_PROVIDER_SITE_OTHER): Payer: Medicare Other | Admitting: Cardiovascular Disease

## 2016-10-24 VITALS — BP 156/78 | HR 69 | Ht 74.0 in | Wt 233.2 lb

## 2016-10-24 DIAGNOSIS — E7800 Pure hypercholesterolemia, unspecified: Secondary | ICD-10-CM | POA: Diagnosis not present

## 2016-10-24 DIAGNOSIS — I714 Abdominal aortic aneurysm, without rupture, unspecified: Secondary | ICD-10-CM

## 2016-10-24 DIAGNOSIS — I10 Essential (primary) hypertension: Secondary | ICD-10-CM

## 2016-10-24 DIAGNOSIS — I2510 Atherosclerotic heart disease of native coronary artery without angina pectoris: Secondary | ICD-10-CM

## 2016-10-24 MED ORDER — AMLODIPINE BESYLATE 10 MG PO TABS: 10.0000 mg | ORAL_TABLET | Freq: Every day | ORAL | 3 refills | Status: DC

## 2016-10-24 MED ORDER — CLOPIDOGREL BISULFATE 75 MG PO TABS: 75.0000 mg | ORAL_TABLET | Freq: Every day | ORAL | 3 refills | Status: DC

## 2016-10-24 MED ORDER — PANTOPRAZOLE SODIUM 40 MG PO TBEC: 40.0000 mg | DELAYED_RELEASE_TABLET | Freq: Every day | ORAL | 3 refills | Status: DC

## 2016-10-24 MED ORDER — METOPROLOL TARTRATE 25 MG PO TABS: 25.0000 mg | ORAL_TABLET | Freq: Every day | ORAL | 3 refills | Status: DC

## 2016-10-24 NOTE — Assessment & Plan Note (Signed)
History of essential hypertension with blood pressure measured today at 156/78. He is on amlodipine and metoprolol. Continue current meds at current dosing

## 2016-10-24 NOTE — Assessment & Plan Note (Signed)
History of CAD status post LAD/diagonal branch stenting by Dr. Elsie Lincoln 06/08/04. A Myoview stress test done for preoperative clearance for back surgery was high risk which led to a outpatient cardiac catheterization which I performed 10/13/15 revealing widely patent stents with a 60% mid dominant RCA stenosis which does not appear to be flow-limiting. He had his back surgery without incident and denies chest pain or shortness of breath.

## 2016-10-24 NOTE — Progress Notes (Signed)
10/24/2016 [name]   Feb 12, 1941  [id number]  Primary Physician Dema Severin, NP Primary Cardiologist: Runell Gess MD Roseanne Reno  HPI:  The patient is a 76 year -old mildly overweight married Caucasian male father of 2, grandfather to 3 grandchildren who I last saw 10/11/15.Marland Kitchen He was previously a patient of Dr. Pierre Bali who had done LAD and diagonal branch stenting June 08, 2004. He had normal LV function at that time. He had re-stenting at [facility] subsequent to that. He also had an Endoluminal stent graft placed at [facility] in 2006 for an abdominal aortic aneurysm. He was last ultrasounded 2 years ago and had no evidence of endoleak. His other problems include hypertension and hyperlipidemia. He denies chest pain or shortness of breath.when I saw him a year ago he was complaining of some lower extremity discomfort with ambulation. Dopplers performed 01/01/14 revealed no evidence of obstructive disease. He apparently needs back surgery. A pharmacologic Myoview stress test done for preoperative clearance was read as high risk with severe anteroseptal and apical ischemia. Because of this he underwent outpatient cardiac catheterization by myself performed radially on 10/13/15 revealing a widely patent LAD and diagonal branch stent. He did have a 60% lesion in the mid dominant RCA which did not appear flow-limiting. He has back surgery successfully without complication after that. He denies chest pain or shortness of breath.   Current Outpatient Prescriptions  Medication Sig Dispense Refill  . amLODipine (NORVASC) 10 MG tablet Take 1 tablet (10 mg total) by mouth daily. 90 tablet 3  . aspirin 325 MG EC tablet Take 325 mg by mouth daily.    . clopidogrel (PLAVIX) 75 MG tablet Take 1 tablet (75 mg total) by mouth daily. 90 tablet 3  . metoprolol tartrate (LOPRESSOR) 25 MG tablet Take 1 tablet (25 mg total) by mouth daily. 90 tablet 3  . pantoprazole (PROTONIX) 40 MG  tablet Take 1 tablet (40 mg total) by mouth daily. 90 tablet 3   No current facility-administered medications for this visit.     Allergies  Allergen Reactions  . Crestor [Rosuvastatin] Other (See Comments)    Severe pain  . Lipitor [Atorvastatin] Other (See Comments)    Severe pain    Social History   Social History  . Marital status: Married    Spouse name: N/A  . Number of children: N/A  . Years of education: N/A   Occupational History  . Not on file.   Social History Main Topics  . Smoking status: Never Smoker  . Smokeless tobacco: Never Used  . Alcohol use No  . Drug use: No  . Sexual activity: Not on file   Other Topics Concern  . Not on file   Social History Narrative  . No narrative on file     Review of Systems: General: negative for chills, fever, night sweats or weight changes.  Cardiovascular: negative for chest pain, dyspnea on exertion, edema, orthopnea, palpitations, paroxysmal nocturnal dyspnea or shortness of breath Dermatological: negative for rash Respiratory: negative for cough or wheezing Urologic: negative for hematuria Abdominal: negative for nausea, vomiting, diarrhea, bright red blood per rectum, melena, or hematemesis Neurologic: negative for visual changes, syncope, or dizziness All other systems reviewed and are otherwise negative except as noted above.    Blood pressure (!) 156/78, pulse 69, height  (1.88 m), weight 233 lb 3.2 oz (105.8 kg).  General appearance: alert Neck: no adenopathy, no carotid bruit, no JVD,  supple, symmetrical, trachea midline and thyroid not enlarged, symmetric, no tenderness/mass/nodules Lungs: clear to auscultation bilaterally Heart: regular rate and rhythm, S1, S2 normal, no murmur, click, rub or gallop Extremities: extremities normal, atraumatic, no cyanosis or edema  EKG sinus rhythm at 69 ST or T-wave changes. I personally reviewed this EKG  ASSESSMENT AND PLAN:   HYPERTENSION, BENIGN  SYSTEMIC History of essential hypertension with blood pressure measured today at 156/78. He is on amlodipine and metoprolol. Continue current meds at current dosing  Coronary atherosclerosis History of CAD status post LAD/diagonal branch stenting by Dr. Elsie Lincoln 06/08/04. A Myoview stress test done for preoperative clearance for back surgery was high risk which led to a outpatient cardiac catheterization which I performed 10/13/15 revealing widely patent stents with a 60% mid dominant RCA stenosis which does not appear to be flow-limiting. He had his back surgery without incident and denies chest pain or shortness of breath.  Aortic aneurysm History of abdominal aortic aneurysm status post endoluminal stent grafting at [facility] 2006.  Hyperlipidemia History of hyperlipidemia not on statin therapy followed by his PCP.      Runell Gess MD FACP,FACC,FAHA, [facility] 10/24/2016 3:51 PM

## 2016-10-24 NOTE — Assessment & Plan Note (Signed)
History of hyperlipidemia not on statin therapy followed by his PCP 

## 2016-10-24 NOTE — Assessment & Plan Note (Signed)
History of abdominal aortic aneurysm status post endoluminal stent grafting at [facility] 2006.

## 2016-10-24 NOTE — Patient Instructions (Signed)

## 2017-07-03 DIAGNOSIS — R0602 Shortness of breath: Secondary | ICD-10-CM | POA: Diagnosis not present

## 2017-12-10 ENCOUNTER — Ambulatory Visit (INDEPENDENT_AMBULATORY_CARE_PROVIDER_SITE_OTHER): Payer: Medicare Other | Admitting: Cardiovascular Disease

## 2017-12-10 ENCOUNTER — Encounter: Payer: Self-pay | Admitting: Cardiovascular Disease

## 2017-12-10 VITALS — BP 132/75 | HR 75 | Wt 226.0 lb

## 2017-12-10 DIAGNOSIS — I2510 Atherosclerotic heart disease of native coronary artery without angina pectoris: Secondary | ICD-10-CM

## 2017-12-10 DIAGNOSIS — I714 Abdominal aortic aneurysm, without rupture, unspecified: Secondary | ICD-10-CM

## 2017-12-10 DIAGNOSIS — E7800 Pure hypercholesterolemia, unspecified: Secondary | ICD-10-CM | POA: Diagnosis not present

## 2017-12-10 DIAGNOSIS — I10 Essential (primary) hypertension: Secondary | ICD-10-CM | POA: Diagnosis not present

## 2017-12-10 MED ORDER — METOPROLOL TARTRATE 25 MG PO TABS: 25.0000 mg | ORAL_TABLET | Freq: Every day | ORAL | 3 refills | Status: DC

## 2017-12-10 MED ORDER — AMLODIPINE BESYLATE 10 MG PO TABS: 10.0000 mg | ORAL_TABLET | Freq: Every day | ORAL | 3 refills | Status: DC

## 2017-12-10 MED ORDER — CLOPIDOGREL BISULFATE 75 MG PO TABS: 75.0000 mg | ORAL_TABLET | Freq: Every day | ORAL | 3 refills | Status: DC

## 2017-12-10 MED ORDER — PANTOPRAZOLE SODIUM 40 MG PO TBEC: 40.0000 mg | DELAYED_RELEASE_TABLET | Freq: Every day | ORAL | 3 refills | Status: DC

## 2017-12-10 NOTE — Assessment & Plan Note (Signed)
History of abdominal aortic aneurysm status post [facility]oluminal stent grafting at [facility] in 2006.  He has not had an abdominal ultrasound for the last 3 or 4 years which we will recheck.

## 2017-12-10 NOTE — Assessment & Plan Note (Signed)
CAD LAD and diagonal branch stenting 06/08/2004 at [facility] after that catheterization on him after an abnormal stress test 10/13/2015 revealing widely patent LAD and diagonal branch stent gnosis in the mid dominant RCA which did not appear to be flow-limiting.  He denies chest pain or shortness of breath.

## 2017-12-10 NOTE — Progress Notes (Signed)
12/10/2017 [name]   [birth year]  [id number]  Primary Physician [name], Regina F, NP Primary Cardiologist: Runell GessJonathan J Berry MD Milagros LollFACP, FACC, [place], [place]  HPI:  [name] is a 77 y.o.  mildly overweight married Caucasian male father of 2, grandfather to 3 grandchildren who I last saw  10/24/2016.Marland Kitchen. He was previously a patient of Dr. Pierre BaliBill Werner's who had done LAD and diagonal branch stenting June 08, 2004. He had normal LV function at that time. He had re[facility]stenting at [facility] subsequent to that. He also had an Endoluminal stent graft placed [facility] in 2006 for an abdominal aortic aneurysm. He was last ultrasounded 2 years ago and had no evidence of endoleak. His other problems include hypertension and hyperlipidemia. He denies chest pain or shortness of breath.when I saw him a year ago he was complaining of some lower extremity discomfort with ambulation. Dopplers performed 01/01/14 revealed no evidence of obstructive disease. He apparently needs back surgery. A pharmacologic Myoview stress test done for preoperative clearance was read as high risk with severe anteroseptal and apical ischemia. Because of this he underwent outpatient cardiac catheterization by myself performed radially on 10/13/15 revealing a widely patent LAD and diagonal branch stent. He did have a 60% lesion in the mid dominant RCA which did not appear flow-limiting. He has back surgery successfully without complication after that.  Since I saw him in the office a year ago he is remained asymptomatic.    Current Meds  Medication Sig  . amLODipine (NORVASC) 10 MG tablet Take 1 tablet (10 mg total) by mouth daily.  Marland Kitchen. aspirin 325 MG EC tablet Take 325 mg by mouth daily.  . clopidogrel (PLAVIX) 75 MG tablet Take 1 tablet (75 mg total) by mouth daily.  . metoprolol tartrate (LOPRESSOR) 25 MG tablet Take 1 tablet (25 mg total) by mouth daily.  . pantoprazole (PROTONIX) 40 MG tablet Take 1 tablet (40 mg total) by mouth  daily.  . [DISCONTINUED] amLODipine (NORVASC) 10 MG tablet Take 1 tablet (10 mg total) by mouth daily.  . [DISCONTINUED] clopidogrel (PLAVIX) 75 MG tablet Take 1 tablet (75 mg total) by mouth daily.  . [DISCONTINUED] metoprolol tartrate (LOPRESSOR) 25 MG tablet Take 1 tablet (25 mg total) by mouth daily.  . [DISCONTINUED] pantoprazole (PROTONIX) 40 MG tablet Take 1 tablet (40 mg total) by mouth daily.     Allergies  Allergen Reactions  . Crestor [Rosuvastatin] Other (See Comments)    Severe pain  . Lipitor [Atorvastatin] Other (See Comments)    Severe pain    Social History   Socioeconomic History  . Marital status: Married    Spouse name: Not on file  . Number of children: Not on file  . Years of education: Not on file  . Highest education level: Not on file  Occupational History  . Not on file  Social Needs  . Financial resource strain: Not on file  . Food insecurity:    Worry: Not on file    Inability: Not on file  . Transportation needs:    Medical: Not on file    Non-medical: Not on file  Tobacco Use  . Smoking status: Never Smoker  . Smokeless tobacco: Never Used  Substance and Sexual Activity  . Alcohol use: No  . Drug use: No  . Sexual activity: Not on file  Lifestyle  . Physical activity:    Days per week: Not on file    Minutes per session: Not on  file  . Stress: Not on file  Relationships  . Social connections:    Talks on phone: Not on file    Gets together: Not on file    Attends religious service: Not on file    Active member of club or organization: Not on file    Attends meetings of clubs or organizations: Not on file    Relationship status: Not on file  . Intimate partner violence:    Fear of current or ex partner: Not on file    Emotionally abused: Not on file    Physically abused: Not on file    Forced sexual activity: Not on file  Other Topics Concern  . Not on file  Social History Narrative  . Not on file     Review of  Systems: General: negative for chills, fever, night sweats or weight changes.  Cardiovascular: negative for chest pain, dyspnea on exertion, edema, orthopnea, palpitations, paroxysmal nocturnal dyspnea or shortness of breath Dermatological: negative for rash Respiratory: negative for cough or wheezing Urologic: negative for hematuria Abdominal: negative for nausea, vomiting, diarrhea, bright red blood per rectum, melena, or hematemesis Neurologic: negative for visual changes, syncope, or dizziness All other systems reviewed and are otherwise negative except as noted above.    Blood pressure 132/75, pulse 75, weight 226 lb (102.5 kg).  General appearance: alert and no distress Neck: no adenopathy, no carotid bruit, no JVD, supple, symmetrical, trachea midline and thyroid not enlarged, symmetric, no tenderness/mass/nodules Lungs: clear to auscultation bilaterally Heart: regular rate and rhythm, S1, S2 normal, no murmur, click, rub or gallop Extremities: extremities normal, atraumatic, no cyanosis or edema Pulses: 2+ and symmetric Skin: Skin color, texture, turgor normal. No rashes or lesions Neurologic: Alert and oriented X 3, normal strength and tone. Normal symmetric reflexes. Normal coordination and gait  EKG sinus rhythm at 63 ST or T wave changes.  I personally reviewed this EKG.  ASSESSMENT AND PLAN:   HYPERTENSION, BENIGN SYSTEMIC History of essential hypertension her blood pressure measured at 132/75 he is on amlodipine and metoprolol.  Continue current meds at current dosing.  Coronary atherosclerosis CAD LAD and diagonal branch stenting 06/08/2004 at [facility] after that catheterization on him after an abnormal stress test 10/13/2015 revealing widely patent LAD and diagonal branch stent gnosis in the mid dominant RCA which did not appear to be flow-limiting.  He denies chest pain or shortness of breath.  Aortic aneurysm History of abdominal aortic aneurysm status post endoluminal  stent grafting at [facility] in 2006.  He has not had an abdominal ultrasound for the last 3 or 4 years which we will recheck.  Hyperlipidemia History of hyperlipidemia not on statin therapy.      Runell Gess MD FACP,FACC,FAHA, [facility] 12/10/2017 3:31 PM

## 2017-12-10 NOTE — Patient Instructions (Signed)
Medication Instructions: Your physician recommends that you continue on your current medications as directed. Please refer to the Current Medication list given to you today.   Testing/Procedures: Your physician has requested that you have an abdominal aorta duplex. During this test, an ultrasound is used to evaluate the aorta. Allow 30 minutes for this exam. Do not eat after midnight the day before and avoid carbonated beverages   Follow-Up: Your physician wants you to follow-up in: 1 year with Dr. Berry. You will receive a reminder letter in the mail two months in advance. If you don't receive a letter, please call our office to schedule the follow-up appointment.  If you need a refill on your cardiac medications before your next appointment, please call your pharmacy.  

## 2017-12-10 NOTE — Assessment & Plan Note (Signed)
History of hyperlipidemia not on statin therapy. 

## 2017-12-10 NOTE — Assessment & Plan Note (Signed)
History of essential hypertension her blood pressure measured at 132/75 he is on amlodipine and metoprolol.  Continue current meds at current dosing.

## 2017-12-18 ENCOUNTER — Ambulatory Visit (HOSPITAL_COMMUNITY)
Admission: RE | Admit: 2017-12-18 | Discharge: 2017-12-18 | Disposition: A | Discharge status: Home / Self Care | Payer: Medicare Other | Source: Ambulatory Visit | Attending: Cardiovascular Disease | Admitting: Cardiovascular Disease

## 2017-12-18 DIAGNOSIS — I10 Essential (primary) hypertension: Secondary | ICD-10-CM | POA: Diagnosis not present

## 2017-12-18 DIAGNOSIS — Z95828 Presence of other vascular implants and grafts: Secondary | ICD-10-CM | POA: Insufficient documentation

## 2017-12-18 DIAGNOSIS — I714 Abdominal aortic aneurysm, without rupture, unspecified: Secondary | ICD-10-CM

## 2017-12-18 DIAGNOSIS — E785 Hyperlipidemia, unspecified: Secondary | ICD-10-CM | POA: Insufficient documentation

## 2017-12-20 ENCOUNTER — Other Ambulatory Visit: Payer: Self-pay | Admitting: *Deleted

## 2017-12-20 DIAGNOSIS — I714 Abdominal aortic aneurysm, without rupture, unspecified: Secondary | ICD-10-CM

## 2018-11-06 ENCOUNTER — Other Ambulatory Visit: Payer: Self-pay | Admitting: Cardiovascular Disease

## 2018-11-06 DIAGNOSIS — I714 Abdominal aortic aneurysm, without rupture, unspecified: Secondary | ICD-10-CM

## 2018-11-11 ENCOUNTER — Telehealth: Payer: Self-pay | Admitting: *Deleted

## 2018-11-11 NOTE — Telephone Encounter (Signed)
A message was left, re: follow up visit. 

## 2018-12-01 ENCOUNTER — Other Ambulatory Visit: Payer: Self-pay | Admitting: Cardiovascular Disease

## 2019-01-01 ENCOUNTER — Other Ambulatory Visit (HOSPITAL_COMMUNITY): Payer: Self-pay | Admitting: Cardiovascular Disease

## 2019-01-01 ENCOUNTER — Ambulatory Visit (HOSPITAL_COMMUNITY)
Admission: RE | Admit: 2019-01-01 | Discharge: 2019-01-01 | Disposition: A | Discharge status: Home / Self Care | Payer: Medicare Other | Source: Ambulatory Visit | Attending: Cardiology | Admitting: Cardiology

## 2019-01-01 ENCOUNTER — Other Ambulatory Visit: Payer: Self-pay

## 2019-01-01 DIAGNOSIS — I714 Abdominal aortic aneurysm, without rupture, unspecified: Secondary | ICD-10-CM

## 2019-01-01 DIAGNOSIS — Z95828 Presence of other vascular implants and grafts: Secondary | ICD-10-CM

## 2019-01-30 ENCOUNTER — Other Ambulatory Visit: Payer: Self-pay | Admitting: Cardiovascular Disease

## 2019-03-12 ENCOUNTER — Telehealth: Payer: Self-pay | Admitting: Cardiovascular Disease

## 2019-03-12 NOTE — Telephone Encounter (Signed)
LVM for patient to call and schedule 1 yr followup. °

## 2019-04-06 ENCOUNTER — Other Ambulatory Visit: Payer: Self-pay | Admitting: Cardiovascular Disease

## 2019-05-26 ENCOUNTER — Other Ambulatory Visit: Payer: Self-pay | Admitting: Cardiovascular Disease

## 2019-05-29 ENCOUNTER — Other Ambulatory Visit: Payer: Self-pay | Admitting: Cardiovascular Disease

## 2019-07-05 ENCOUNTER — Other Ambulatory Visit: Payer: Self-pay | Admitting: Cardiovascular Disease

## 2019-07-22 ENCOUNTER — Other Ambulatory Visit: Payer: Self-pay | Admitting: Cardiovascular Disease

## 2019-07-22 NOTE — Telephone Encounter (Signed)
Rx request sent to pharmacy.  

## 2019-08-01 ENCOUNTER — Other Ambulatory Visit: Payer: Self-pay | Admitting: Cardiovascular Disease

## 2019-08-03 ENCOUNTER — Other Ambulatory Visit: Payer: Self-pay | Admitting: Cardiovascular Disease

## 2019-08-03 NOTE — Telephone Encounter (Signed)
Rx has been sent to the pharmacy electronically. ° °

## 2019-08-13 ENCOUNTER — Ambulatory Visit: Payer: Medicare Other | Admitting: Cardiovascular Disease

## 2019-08-14 ENCOUNTER — Other Ambulatory Visit: Payer: Self-pay

## 2019-08-26 ENCOUNTER — Ambulatory Visit (INDEPENDENT_AMBULATORY_CARE_PROVIDER_SITE_OTHER): Payer: Medicare Other | Admitting: Cardiovascular Disease

## 2019-08-26 ENCOUNTER — Other Ambulatory Visit: Payer: Self-pay

## 2019-08-26 ENCOUNTER — Encounter: Payer: Self-pay | Admitting: Cardiovascular Disease

## 2019-08-26 DIAGNOSIS — E782 Mixed hyperlipidemia: Secondary | ICD-10-CM | POA: Diagnosis not present

## 2019-08-26 DIAGNOSIS — I2510 Atherosclerotic heart disease of native coronary artery without angina pectoris: Secondary | ICD-10-CM

## 2019-08-26 DIAGNOSIS — I10 Essential (primary) hypertension: Secondary | ICD-10-CM

## 2019-08-26 DIAGNOSIS — I714 Abdominal aortic aneurysm, without rupture, unspecified: Secondary | ICD-10-CM

## 2019-08-26 DIAGNOSIS — I482 Chronic atrial fibrillation, unspecified: Secondary | ICD-10-CM

## 2019-08-26 DIAGNOSIS — I4891 Unspecified atrial fibrillation: Secondary | ICD-10-CM | POA: Insufficient documentation

## 2019-08-26 LAB — BASIC METABOLIC PANEL
BUN/Creatinine Ratio: 10 (ref 10–24)
BUN: 15 mg/dL (ref 8–27)
CO2: 25 mmol/L (ref 20–29)
Calcium: 9.2 mg/dL (ref 8.6–10.2)
Chloride: 102 mmol/L (ref 96–106)
Creatinine, Ser: 1.51 mg/dL — ABNORMAL HIGH (ref 0.76–1.27)
GFR calc Af Amer: 51 mL/min/{1.73_m2} — ABNORMAL LOW (ref >59)
GFR calc non Af Amer: 44 mL/min/{1.73_m2} — ABNORMAL LOW (ref >59)
Glucose: 95 mg/dL (ref 65–99)
Potassium: 4.7 mmol/L (ref 3.5–5.2)
Sodium: 142 mmol/L (ref 134–144)

## 2019-08-26 LAB — HEPATIC FUNCTION PANEL
ALT: 14 IU/L (ref 0–44)
AST: 28 IU/L (ref 0–40)
Albumin: 4.2 g/dL (ref 3.7–4.7)
Alkaline Phosphatase: 91 IU/L (ref 39–117)
Bilirubin Total: 0.7 mg/dL (ref 0.0–1.2)
Bilirubin, Direct: 0.21 mg/dL (ref 0.00–0.40)
Total Protein: 6.9 g/dL (ref 6.0–8.5)

## 2019-08-26 LAB — LIPID PANEL
Chol/HDL Ratio: 4.6 ratio (ref 0.0–5.0)
Cholesterol, Total: 139 mg/dL (ref 100–199)
HDL: 30 mg/dL — ABNORMAL LOW (ref >39)
LDL Chol Calc (NIH): 91 mg/dL (ref 0–99)
Triglycerides: 93 mg/dL (ref 0–149)
VLDL Cholesterol Cal: 18 mg/dL (ref 5–40)

## 2019-08-26 MED ORDER — APIXABAN 5 MG PO TABS: ORAL_TABLET | ORAL | 11 refills | Status: DC

## 2019-08-26 MED ORDER — PANTOPRAZOLE SODIUM 40 MG PO TBEC: DELAYED_RELEASE_TABLET | ORAL | 3 refills | Status: DC

## 2019-08-26 MED ORDER — METOPROLOL TARTRATE 25 MG PO TABS: 25.0000 mg | ORAL_TABLET | Freq: Every day | ORAL | 3 refills | Status: DC

## 2019-08-26 MED ORDER — AMLODIPINE BESYLATE 10 MG PO TABS: 10.0000 mg | ORAL_TABLET | Freq: Every day | ORAL | 3 refills | Status: DC

## 2019-08-26 NOTE — Assessment & Plan Note (Signed)
History of essential hypertension a blood pressure measured today 140/86.  He is on amlodipine and metoprolol.

## 2019-08-26 NOTE — Assessment & Plan Note (Signed)
EKG today shows atrial fibrillation with controlled ventricular response.  He was in sinus rhythm when I saw him a year and a half ago.The CHA2DSVASC2 score is  4 .  I am going to stop his Plavix, continue baby aspirin and begin him on Eliquis oral anticoagulation.

## 2019-08-26 NOTE — Progress Notes (Signed)
08/26/2019 [name]   1940/08/08  [id number]  Primary Physician Imagene Riches, NP Primary Cardiologist: Lorretta Harp MD [name], [place], [place]  HPI:  [name] is a 79 y.o.  mildly overweight married Caucasian male father of 2, grandfather to 3 grandchildren who I last saw  12/10/2017.Marland Kitchen He was previously a patient of Dr. Merri Ray who had done LAD and diagonal branch stenting June 08, 2004. He had normal LV function at that time. He had re-stenting at [facility] subsequent to that. He also had an Endoluminal stent graft placed at [facility] in 2006 for an abdominal aortic aneurysm. He was last ultrasounded 2 years ago and had no evidence of endoleak. His other problems include hypertension and hyperlipidemia. He denies chest pain or shortness of breath.when I saw him a year ago he was complaining of some lower extremity discomfort with ambulation. Dopplers performed 01/01/14 revealed no evidence of obstructive disease. He apparently needs back surgery. A pharmacologic Myoview stress test done for preoperative clearance was read as high risk with severe anteroseptal and apical ischemia.Because of this he underwent outpatient cardiac catheterization by myself performed radially on 10/13/15 revealing a widely patent LAD and diagonal branch stent. He did have a 60% lesion in the mid dominant RCA which did not appear flow-limiting. He has back surgery successfully without complication after that.  Since I saw him in the office a year and a half ago he is remained stable.  He denies chest pain or shortness of breath.  He is however in atrial fibrillation with a controlled ventricular spots which he is unaware of.   Current Meds  Medication Sig  . amLODipine (NORVASC) 10 MG tablet Take 1 tablet (10 mg total) by mouth daily. Please schedule appointment for refills.  Marland Kitchen aspirin 325 MG EC tablet Take 325 mg by mouth daily.  . clopidogrel (PLAVIX) 75 MG tablet TAKE 1 TABLET BY MOUTH DAILY   . metoprolol tartrate (LOPRESSOR) 25 MG tablet TAKE 1 TABLET BY MOUTH DAILY  . pantoprazole (PROTONIX) 40 MG tablet TAKE 1 TABLET(40 MG) BY MOUTH DAILY     Allergies  Allergen Reactions  . Crestor [Rosuvastatin] Other (See Comments)    Severe pain  . Lipitor [Atorvastatin] Other (See Comments)    Severe pain    Social History   Socioeconomic History  . Marital status: Married    Spouse name: Not on file  . Number of children: Not on file  . Years of education: Not on file  . Highest education level: Not on file  Occupational History  . Not on file  Tobacco Use  . Smoking status: Never Smoker  . Smokeless tobacco: Never Used  Substance and Sexual Activity  . Alcohol use: No  . Drug use: No  . Sexual activity: Not on file  Other Topics Concern  . Not on file  Social History Narrative  . Not on file   Social Determinants of Health   Financial Resource Strain:   . Difficulty of Paying Living Expenses: Not on file  Food Insecurity:   . Worried About Charity fundraiser in the Last Year: Not on file  . Ran Out of Food in the Last Year: Not on file  Transportation Needs:   . Lack of Transportation (Medical): Not on file  . Lack of Transportation (Non-Medical): Not on file  Physical Activity:   . Days of Exercise per Week: Not on file  . Minutes of Exercise per Session:  Not on file  Stress:   . Feeling of Stress : Not on file  Social Connections:   . Frequency of Communication with Friends and Family: Not on file  . Frequency of Social Gatherings with Friends and Family: Not on file  . Attends Religious Services: Not on file  . Active Member of Clubs or Organizations: Not on file  . Attends Banker Meetings: Not on file  . Marital Status: Not on file  Intimate Partner Violence:   . Fear of Current or Ex-Partner: Not on file  . Emotionally Abused: Not on file  . Physically Abused: Not on file  . Sexually Abused: Not on file     Review of Systems:  General: negative for chills, fever, night sweats or weight changes.  Cardiovascular: negative for chest pain, dyspnea on exertion, edema, orthopnea, palpitations, paroxysmal nocturnal dyspnea or shortness of breath Dermatological: negative for rash Respiratory: negative for cough or wheezing Urologic: negative for hematuria Abdominal: negative for nausea, vomiting, diarrhea, bright red blood per rectum, melena, or hematemesis Neurologic: negative for visual changes, syncope, or dizziness All other systems reviewed and are otherwise negative except as noted above.    Blood pressure 140/86, pulse 63, temperature 98 F (36.7 C), height 6\' 2"  (1.88 m), weight 228 lb (103.4 kg), SpO2 95 %.  General appearance: alert and no distress Neck: no adenopathy, no carotid bruit, no JVD, supple, symmetrical, trachea midline and thyroid not enlarged, symmetric, no tenderness/mass/nodules Lungs: clear to auscultation bilaterally Heart: irregularly irregular rhythm Extremities: extremities normal, atraumatic, no cyanosis or edema Pulses: 2+ and symmetric Skin: Skin color, texture, turgor normal. No rashes or lesions Neurologic: Alert and oriented X 3, normal strength and tone. Normal symmetric reflexes. Normal coordination and gait  EKG atrial fibrillation with ventricular spots of 65 and nonspecific ST and T wave changes.  I personally reviewed this EKG.  ASSESSMENT AND PLAN:   HYPERTENSION, BENIGN SYSTEMIC History of essential hypertension a blood pressure measured today 140/86.  He is on amlodipine and metoprolol.  Coronary atherosclerosis History of CAD status post LAD and diagonal branch stenting June 08, 2004.  He had restenting at [facility] after that.  Because of abnormal stress test which was high risk done for preoperative clearance before back surgery he underwent repeat cardiac catheterization by myself radially/20/17 revealing a widely patent LAD and diagonal branch stent.  He did have a  60% lesion in the mid dominant RCA that did not appear to be flow-limiting.  He denies chest pain or shortness of breath.  He remains on aspirin and Plavix.  Aortic aneurysm Status post endoluminal stent grafting in 2006 at [facility] which we followed by abdominal ultrasound most recently done 01/01/2019 revealing a residual aneurysm sac of 4.2 cm.  This will be repeated this July  Hyperlipidemia History of hyperlipidemia not on statin therapy.  We will check a fasting lipid liver profile.  Atrial fibrillation (HCC) EKG today shows atrial fibrillation with controlled ventricular response.  He was in sinus rhythm when I saw him a year and a half ago.The CHA2DSVASC2 score is  4 .  I am going to stop his Plavix, continue baby aspirin and begin him on Eliquis oral anticoagulation.      August MD FACP,FACC,FAHA, [facility] 08/26/2019 10:56 AM

## 2019-08-26 NOTE — Assessment & Plan Note (Signed)
Status post endoluminal stent grafting in 2006 at [facility] which we followed by abdominal ultrasound most recently done 01/01/2019 revealing a residual aneurysm sac of 4.2 cm.  This will be repeated this July

## 2019-08-26 NOTE — Assessment & Plan Note (Signed)
History of hyperlipidemia not on statin therapy.  We will check a fasting lipid liver profile.

## 2019-08-26 NOTE — Assessment & Plan Note (Signed)
History of CAD status post LAD and diagonal branch stenting June 08, 2004.  He had restenting at [facility] after that.  Because of abnormal stress test which was high risk done for preoperative clearance before back surgery he underwent repeat cardiac catheterization by myself radially/20/17 revealing a widely patent LAD and diagonal branch stent.  He did have a 60% lesion in the mid dominant RCA that did not appear to be flow-limiting.  He denies chest pain or shortness of breath.  He remains on aspirin and Plavix.

## 2019-08-26 NOTE — Patient Instructions (Signed)
Medication Instructions:  Stop taking Plavix. Start taking 5mg  Eliquis.  If you need a refill on your cardiac medications before your next appointment, please call your pharmacy.   Lab work: BMET, Lipids and Hepatic Function If you have labs (blood work) drawn today and your tests are completely normal, you will receive your results only by: MyChart Message (if you have MyChart) OR A paper copy in the mail If you have any lab test that is abnormal or we need to change your treatment, we will call you to review the results.  Testing/Procedures: Your physician has requested that you have an echocardiogram. Echocardiography is a painless test that uses sound waves to create images of your heart. It provides your doctor with information about the size and shape of your heart and how well your heart's chambers and valves are working. This procedure takes approximately one hour. There are no restrictions for this procedure. [address]   AND  Your physician has requested that you have an abdominal aorta duplex in July 2021. During this test, an ultrasound is used to evaluate the aorta. Allow 30 minutes for this exam. Do not eat after midnight the day before and avoid carbonated beverages  Follow-Up: At [facility], you and your health needs are our priority.  As part of our continuing mission to provide you with exceptional heart care, we have created designated Provider Care Teams.  These Care Teams include your primary Cardiologist (physician) and Advanced Practice Providers (APPs -  Physician Assistants and Nurse Practitioners) who all work together to provide you with the care you need, when you need it. You may see Dr. [facility] or one of the following Advanced Practice Providers on your designated Care Team:    Allyson Sabal, PA-C  [place], [place]  [place], Edd Fabian  Your physician wants you to follow-up in: 3 months with Dr. [place]  Any Other Special  Instructions Will Be Listed Below (If Applicable). Please fill out Allyson Sabal Paperwork for patient assistance.

## 2019-08-28 ENCOUNTER — Other Ambulatory Visit: Payer: Self-pay

## 2019-08-28 DIAGNOSIS — E782 Mixed hyperlipidemia: Secondary | ICD-10-CM

## 2019-08-28 DIAGNOSIS — I2510 Atherosclerotic heart disease of native coronary artery without angina pectoris: Secondary | ICD-10-CM

## 2019-09-02 ENCOUNTER — Other Ambulatory Visit: Payer: Self-pay | Admitting: Cardiovascular Disease

## 2019-09-11 ENCOUNTER — Other Ambulatory Visit: Payer: Self-pay | Admitting: Cardiovascular Disease

## 2019-09-14 ENCOUNTER — Other Ambulatory Visit: Payer: Self-pay

## 2019-09-14 ENCOUNTER — Ambulatory Visit (HOSPITAL_COMMUNITY): Payer: Medicare Other | Attending: Cardiovascular Disease

## 2019-09-14 DIAGNOSIS — E782 Mixed hyperlipidemia: Secondary | ICD-10-CM | POA: Insufficient documentation

## 2019-09-14 DIAGNOSIS — I714 Abdominal aortic aneurysm, without rupture, unspecified: Secondary | ICD-10-CM

## 2019-09-14 DIAGNOSIS — I2510 Atherosclerotic heart disease of native coronary artery without angina pectoris: Secondary | ICD-10-CM | POA: Insufficient documentation

## 2019-09-14 DIAGNOSIS — I10 Essential (primary) hypertension: Secondary | ICD-10-CM | POA: Diagnosis present

## 2019-09-14 DIAGNOSIS — I482 Chronic atrial fibrillation, unspecified: Secondary | ICD-10-CM | POA: Insufficient documentation

## 2019-09-15 ENCOUNTER — Other Ambulatory Visit: Payer: Self-pay

## 2019-09-23 ENCOUNTER — Other Ambulatory Visit: Payer: Self-pay | Admitting: Cardiovascular Disease

## 2019-09-23 ENCOUNTER — Telehealth: Payer: Self-pay | Admitting: Cardiovascular Disease

## 2019-09-23 NOTE — Telephone Encounter (Signed)
Spoke with Erskine Squibb at [employer]. Patient's Eliquis application was out of date when received. The form was from 2013 a form for 2021 will need to be used. Erskine Squibb is faxing an updated form to [phone]. Attempted to call patient to notify him the application will be left at the front desk for him to complete but no answer and unable to leave a voicemail. Will route to primary covering nurse for review.

## 2019-09-23 NOTE — Telephone Encounter (Signed)
Erskine Squibb from [employer] calling stating they received an application for assistance for eliquis, but the form is out of date.

## 2019-09-23 NOTE — Telephone Encounter (Signed)
Spoke with pt, he is aware form needs to be redone. In dr bery's folder to sign and then can be sent back to the company.

## 2019-09-29 ENCOUNTER — Telehealth: Payer: Self-pay | Admitting: Cardiovascular Disease

## 2019-09-29 NOTE — Telephone Encounter (Signed)
Follow Up:      Pt said he received a letterr stating that he needs more information for his Pt Assistance application.

## 2019-09-29 NOTE — Telephone Encounter (Signed)
Left message for patient to call back  

## 2019-10-01 MED ORDER — APIXABAN 5 MG PO TABS: ORAL_TABLET | ORAL | 11 refills | Status: DC

## 2019-10-01 NOTE — Telephone Encounter (Signed)
Follow up    Returning a call to the nurse regarding patient assistance application needing more info.  Also, patient has a discount card for eliquis but states that he needs a prescription in order to get the discount.  Please call in presc at [facility] in [place].

## 2019-10-01 NOTE — Telephone Encounter (Signed)
Prescription sent to [facility] in [place] per patient request. Patient aware. Patient verbalized understanding. Patient requesting update on [facility] application. Will route to [facility] for review.

## 2019-10-06 NOTE — Telephone Encounter (Signed)
Forms faxed to Patient Assistance, confirmation received

## 2019-10-12 ENCOUNTER — Other Ambulatory Visit: Payer: Self-pay

## 2019-10-12 MED ORDER — APIXABAN 5 MG PO TABS: ORAL_TABLET | ORAL | 1 refills | Status: DC

## 2019-10-22 ENCOUNTER — Telehealth: Payer: Self-pay | Admitting: Cardiovascular Disease

## 2019-10-22 MED ORDER — APIXABAN 5 MG PO TABS: ORAL_TABLET | ORAL | 1 refills | Status: DC

## 2019-10-22 NOTE — Telephone Encounter (Signed)
Spoke with pt, according to [employer] squibb they need to talk to the patient. Patient aware to call [phone] for the company to send him his medications.

## 2019-10-22 NOTE — Telephone Encounter (Signed)
Patient called to report received letter of approval from [employer] patient assistance program. Any update on this paperwork?    Medication Samples have been provided to the patient.  Drug name: Eliquis       Strength: 5mg       Qty: 28 tabs  LOT: Exp.Date: FEB/2023  Dosing instructions: TAKE 1 TABLET EVERY MORNING AND 1 TABLET EVERY EVENING   Caitlinn Klinker Rodriguez-Guzman PharmD, BCPS, CPP [facility] [address] [place] 10/22/2019 1:21 PM

## 2019-10-22 NOTE — Telephone Encounter (Signed)
2 weeks sample provided. Message sent to RN to follow up on [facility] patient assistance.  Patient WILL NOT pick up prescription from [facility] if co-pay needed.

## 2019-10-22 NOTE — Telephone Encounter (Signed)
Called to cancel the prescription of eliquis sent today per Raquel Pharmd CPP 62m 103.4kg Scr 1.51 08/26/19 Lovw/[place] 08/26/19

## 2019-10-22 NOTE — Telephone Encounter (Signed)
  *  STAT* If patient is at the pharmacy, call can be transferred to refill team.   1. Which medications need to be refilled? (please list name of each medication and dose if known) apixaban (ELIQUIS) 5 MG TABS tablet  2. Which pharmacy/location (including street and city if local pharmacy) is medication to be sent to? [facility] [phone] - [place], [place] - [address]  3. Do they need a 30 day or 90 day supply? 90 days  Pt said he has approved for medication assistant

## 2019-11-04 ENCOUNTER — Other Ambulatory Visit: Payer: Self-pay

## 2019-11-04 MED ORDER — APIXABAN 5 MG PO TABS: ORAL_TABLET | ORAL | 1 refills | Status: DC

## 2019-11-11 ENCOUNTER — Other Ambulatory Visit: Payer: Self-pay

## 2019-11-11 MED ORDER — APIXABAN 5 MG PO TABS: ORAL_TABLET | ORAL | 1 refills | Status: DC

## 2019-11-23 ENCOUNTER — Emergency Department (HOSPITAL_COMMUNITY): Payer: Medicare Other

## 2019-11-23 ENCOUNTER — Encounter (HOSPITAL_COMMUNITY): Payer: Self-pay

## 2019-11-23 ENCOUNTER — Emergency Department (HOSPITAL_COMMUNITY)
Admission: EM | Admit: 2019-11-23 | Discharge: 2019-11-24 | Disposition: A | Discharge status: Home / Self Care | Payer: Medicare Other | Attending: Emergency Medicine | Admitting: Emergency Medicine

## 2019-11-23 ENCOUNTER — Other Ambulatory Visit: Payer: Self-pay

## 2019-11-23 DIAGNOSIS — Z7901 Long term (current) use of anticoagulants: Secondary | ICD-10-CM | POA: Insufficient documentation

## 2019-11-23 DIAGNOSIS — N200 Calculus of kidney: Secondary | ICD-10-CM | POA: Diagnosis not present

## 2019-11-23 DIAGNOSIS — Z7982 Long term (current) use of aspirin: Secondary | ICD-10-CM | POA: Diagnosis not present

## 2019-11-23 DIAGNOSIS — R1032 Left lower quadrant pain: Secondary | ICD-10-CM | POA: Diagnosis present

## 2019-11-23 DIAGNOSIS — Z79899 Other long term (current) drug therapy: Secondary | ICD-10-CM | POA: Diagnosis not present

## 2019-11-23 DIAGNOSIS — I10 Essential (primary) hypertension: Secondary | ICD-10-CM | POA: Insufficient documentation

## 2019-11-23 LAB — URINALYSIS, ROUTINE W REFLEX MICROSCOPIC
Bilirubin Urine: NEGATIVE
Glucose, UA: NEGATIVE mg/dL
Ketones, ur: NEGATIVE mg/dL
Leukocytes,Ua: NEGATIVE
Nitrite: NEGATIVE
Protein, ur: NEGATIVE mg/dL
RBC / HPF: >50 RBC/hpf — ABNORMAL HIGH (ref 0–5)
Specific Gravity, Urine: 1.01 (ref 1.005–1.030)
pH: 5 (ref 5.0–8.0)

## 2019-11-23 LAB — COMPREHENSIVE METABOLIC PANEL
ALT: 17 U/L (ref 0–44)
AST: 25 U/L (ref 15–41)
Albumin: 3.5 g/dL (ref 3.5–5.0)
Alkaline Phosphatase: 88 U/L (ref 38–126)
Anion gap: 8 (ref 5–15)
BUN: 15 mg/dL (ref 8–23)
CO2: 27 mmol/L (ref 22–32)
Calcium: 9.1 mg/dL (ref 8.9–10.3)
Chloride: 105 mmol/L (ref 98–111)
Creatinine, Ser: 1.37 mg/dL — ABNORMAL HIGH (ref 0.61–1.24)
GFR calc Af Amer: 57 mL/min — ABNORMAL LOW (ref >60)
GFR calc non Af Amer: 49 mL/min — ABNORMAL LOW (ref >60)
Glucose, Bld: 146 mg/dL — ABNORMAL HIGH (ref 70–99)
Potassium: 4.3 mmol/L (ref 3.5–5.1)
Sodium: 140 mmol/L (ref 135–145)
Total Bilirubin: 0.9 mg/dL (ref 0.3–1.2)
Total Protein: 6.8 g/dL (ref 6.5–8.1)

## 2019-11-23 LAB — CBC
HCT: 49.9 % (ref 39.0–52.0)
Hemoglobin: 16.4 g/dL (ref 13.0–17.0)
MCH: 30 pg (ref 26.0–34.0)
MCHC: 32.9 g/dL (ref 30.0–36.0)
MCV: 91.4 fL (ref 80.0–100.0)
Platelets: 179 10*3/uL (ref 150–400)
RBC: 5.46 MIL/uL (ref 4.22–5.81)
RDW: 13.9 % (ref 11.5–15.5)
WBC: 5.6 10*3/uL (ref 4.0–10.5)
nRBC: 0 % (ref 0.0–0.2)

## 2019-11-23 LAB — LIPASE, BLOOD: Lipase: 15 U/L (ref 11–51)

## 2019-11-23 MED ORDER — IOHEXOL 300 MG/ML  SOLN
100.0000 mL | Freq: Once | INTRAMUSCULAR | Status: AC | PRN
  Administered 2019-11-23: 100 mL via INTRAVENOUS

## 2019-11-23 MED ORDER — SODIUM CHLORIDE 0.9% FLUSH: 3.0000 mL | Freq: Once | INTRAVENOUS | Status: DC

## 2019-11-23 MED ORDER — HYDROCODONE-ACETAMINOPHEN 5-325 MG PO TABS: 1.0000 | ORAL_TABLET | ORAL | 0 refills | Status: DC | PRN

## 2019-11-23 NOTE — ED Triage Notes (Signed)
Pt presents w/LLQ pain started this afternoon. Pt denies any concerns w/urinating.

## 2019-11-23 NOTE — ED Provider Notes (Addendum)
[facility] EMERGENCY DEPARTMENT Provider Note   CSN: [id number] Arrival date & time: 11/23/19  1754     History Chief Complaint  Patient presents with  . Abdominal Pain    [name] is a 79 y.o. male.  The history is provided by the patient. No language interpreter was used.  Abdominal Pain Pain location:  LLQ Pain quality: sharp and stabbing   Pain radiates to:  Does not radiate Pain severity:  Severe Onset quality:  Sudden Timing:  Sporadic Chronicity:  New Relieved by:  Nothing Associated symptoms: no dysuria and no vomiting   Pt reports he had sudden onset of severe left lower abdominal pain.  Pt reports the pain happened twice.  He reports the pain has completely resolved now.  He reports he has never had a pain like this.  He had a AAA stent in [place] in 2006.  Pt reports he had a normal ultrasound recently      Past Medical History:  Diagnosis Date  . Abdominal aortic aneurysm [facility])    status post endoluminal stent graft at [facility] in 2006  . Abdominal aortic aneurysm (HCC) 2006  . Arthritis   . Coronary artery disease   . GERD (gastroesophageal reflux disease)   . History of shingles    TIMES 2  . Hyperlipidemia   . Hypertension   . MRSA (methicillin resistant Staphylococcus aureus) infection   . Numbness    right leg from knee down worse when standing   . PONV (postoperative nausea and vomiting)   . Sleep apnea    pt scored 5 per Stop Bang tool at PAT visit 10/26/2015; results sent to [facility] NP  . Vertigo     Patient Active Problem List   Diagnosis Date Noted  . Atrial fibrillation (HCC) 08/26/2019  . Spinal stenosis of lumbar region 11/02/2015  . Abnormal nuclear stress test   . Hyperlipidemia 12/31/2014  . HYPERTENSION, BENIGN SYSTEMIC 08/22/2006  . Coronary atherosclerosis 08/22/2006  . Aortic aneurysm (HCC) 08/22/2006    Past Surgical History:  Procedure Laterality Date  . ABDOMINAL AORTIC ANEURYSM  REPAIR W/ ENDOLUMINAL GRAFT     13 years ago   . ANGIOPLASTY    . branch stenting  06/08/2004   Dr. Lavonne Chick  . CARDIAC CATHETERIZATION  06/08/2004  . CARDIOVASCULAR STRESS TEST  02/14/2009  . I&D of left knee     . LUMBAR LAMINECTOMY/DECOMPRESSION MICRODISCECTOMY N/A 11/02/2015   Procedure: MICROLUMBAR DECOMPRESSION L3-L4, L4-L5, AND L5-S1;  Surgeon: Jene Every, MD;  Location: [facility] ORS;  Service: Orthopedics;  Laterality: N/A;  . PICC LINE PLACE PERIPHERAL ([facility] HX)    . TRANSTHORACIC ECHOCARDIOGRAM  06/07/2004       Family History  Problem Relation Age of Onset  . Alzheimer's disease Sister   . Heart failure Maternal Grandmother   . Heart failure Maternal Grandfather   . Heart failure Paternal Grandmother   . Heart failure Paternal Grandfather   . Cancer Sister     Social History   Tobacco Use  . Smoking status: Never Smoker  . Smokeless tobacco: Never Used  Substance Use Topics  . Alcohol use: No  . Drug use: No    Home Medications Prior to Admission medications   Medication Sig Start Date End Date Taking? Authorizing Provider  amLODipine (NORVASC) 10 MG tablet TAKE 1 TABLET(10 MG) BY MOUTH DAILY 09/23/19   Runell Gess, MD  apixaban (ELIQUIS) 5 MG TABS tablet Take  2 tablets (10mg ) twice daily for 7 days, then 1 tablet (5mg ) twice daily 11/11/19   , MD  aspirin 325 MG EC tablet Take 325 mg by mouth daily.    [provider]  clopidogrel (PLAVIX) 75 MG tablet TAKE 1 TABLET BY MOUTH DAILY 09/04/19   Runell Gess, MD  metoprolol tartrate (LOPRESSOR) 25 MG tablet TAKE 1 TABLET BY MOUTH DAILY 09/11/19   Runell Gess, MD  pantoprazole (PROTONIX) 40 MG tablet TAKE 1 TABLET(40 MG) BY MOUTH DAILY 08/26/19   Runell Gess, MD    Allergies    Crestor [rosuvastatin] and Lipitor [atorvastatin]  Review of Systems   Review of Systems  Gastrointestinal: Positive for abdominal pain. Negative for vomiting.  Genitourinary: Negative for  dysuria.  All other systems reviewed and are negative.   Physical Exam Updated Vital Signs BP (!) 160/92 (BP Location: Right Arm)   Pulse 67   Temp 98.5 F (36.9 C) (Oral)   Resp 16   SpO2 97%   Physical Exam Vitals and nursing note reviewed.  Constitutional:      Appearance: He is well-developed.  HENT:     Head: Normocephalic and atraumatic.  Eyes:     Conjunctiva/sclera: Conjunctivae normal.  Cardiovascular:     Rate and Rhythm: Normal rate and regular rhythm.     Heart sounds: No murmur.  Pulmonary:     Effort: Pulmonary effort is normal. No respiratory distress.     Breath sounds: Normal breath sounds.  Abdominal:     General: Abdomen is flat.     Palpations: Abdomen is soft.     Tenderness: There is no abdominal tenderness.  Musculoskeletal:     Cervical back: Neck supple.  Skin:    General: Skin is warm and dry.  Neurological:     General: No focal deficit present.     Mental Status: He is alert.  Psychiatric:        Mood and Affect: Mood normal.     ED Results / Procedures / Treatments   Labs (all labs ordered are listed, but only abnormal results are displayed) Labs Reviewed  COMPREHENSIVE METABOLIC PANEL - Abnormal; Notable for the following components:      Result Value   Glucose, Bld 146 (*)    Creatinine, Ser 1.37 (*)    GFR calc non Af Amer 49 (*)    GFR calc Af Amer 57 (*)    All other components within normal limits  URINALYSIS, ROUTINE W REFLEX MICROSCOPIC - Abnormal; Notable for the following components:   Hgb urine dipstick LARGE (*)    RBC / HPF >50 (*)    Bacteria, UA RARE (*)    All other components within normal limits  LIPASE, BLOOD  CBC    EKG None  Radiology No results found.  Procedures Procedures (including critical care time)  Medications Ordered in ED Medications  sodium chloride flush (NS) 0.9 % injection 3 mL (has no administration in time range)    ED Course  I have reviewed the triage vital signs and the  nursing notes.  Pertinent labs & imaging results that were available during my care of the patient were reviewed by me and considered in my medical decision making (see chart for details).    MDM Rules/Calculators/A&P                      MDM:  Labs reviewed.   Ct abdomen show UVJ  stone or possible passed stone.  Pt does not have any uri symptoms.  I doubt pneumonia.  Pt advised to follow up with Urology. Follow up with primary care for recheck  Final Clinical Impression(s) / ED Diagnoses Final diagnoses:  Kidney stone    Rx / DC Orders ED Discharge Orders         Ordered    HYDROcodone-acetaminophen (NORCO/VICODIN) 5-325 MG tablet  Every 4 hours PRN     11/23/19 2356        An After Visit Summary was printed and given to the patient.    Fransico Meadow, PA-C 11/23/19 2356    Fransico Meadow, PA-C 11/23/19 2358    Fransico Meadow, PA-C 11/24/19 0017    Orpah Greek, MD 11/24/19 [phone]

## 2019-11-23 NOTE — Discharge Instructions (Addendum)
Return if any problems.

## 2019-11-23 NOTE — ED Notes (Signed)
Pt to ct 

## 2019-11-27 ENCOUNTER — Ambulatory Visit (INDEPENDENT_AMBULATORY_CARE_PROVIDER_SITE_OTHER): Payer: Medicare Other | Admitting: Cardiovascular Disease

## 2019-11-27 ENCOUNTER — Other Ambulatory Visit: Payer: Self-pay

## 2019-11-27 ENCOUNTER — Encounter: Payer: Self-pay | Admitting: Cardiovascular Disease

## 2019-11-27 VITALS — BP 140/80 | HR 74 | Ht 74.0 in | Wt 229.0 lb

## 2019-11-27 DIAGNOSIS — I482 Chronic atrial fibrillation, unspecified: Secondary | ICD-10-CM

## 2019-11-27 DIAGNOSIS — I714 Abdominal aortic aneurysm, without rupture, unspecified: Secondary | ICD-10-CM

## 2019-11-27 DIAGNOSIS — I2510 Atherosclerotic heart disease of native coronary artery without angina pectoris: Secondary | ICD-10-CM

## 2019-11-27 DIAGNOSIS — I712 Thoracic aortic aneurysm, without rupture, unspecified: Secondary | ICD-10-CM

## 2019-11-27 NOTE — Patient Instructions (Signed)
Medication Instructions:  DECREASE aspirin to 81mg  daily Continue all other current medications  *If you need a refill on your cardiac medications before your next appointment, please call your pharmacy*   Lab Work: NONE If you have labs (blood work) drawn today and your tests are completely normal, you will receive your results only by: MyChart Message (if you have MyChart) OR . A paper copy in the mail If you have any lab test that is abnormal or we need to change your treatment, we will call you to review the results.   Testing/Procedures: Echocardiogram in 1 year Doppler study as scheduled for July 7   Follow-Up: At [facility], you and your health needs are our priority.  As part of our continuing mission to provide you with exceptional heart care, we have created designated Provider Care Teams.  These Care Teams include your primary Cardiologist (physician) and Advanced Practice Providers (APPs -  Physician Assistants and Nurse Practitioners) who all work together to provide you with the care you need, when you need it.  We recommend signing up for the patient portal called "MyChart".  Sign up information is provided on this After Visit Summary.  MyChart is used to connect with patients for Virtual Visits (Telemedicine).  Patients are able to view lab/test results, encounter notes, upcoming appointments, etc.  Non-urgent messages can be sent to your provider as well.   To learn more about what you can do with MyChart, go to [facility].    Your next appointment:   6 month(s) with one of the following Advanced Practice Providers on your designated Care Team:    [email or web address], PA-C  [place], [place]  [place], Edd Fabian   12 months with Dr. Oregon   The format for your next appointment:   In Person

## 2019-11-27 NOTE — Assessment & Plan Note (Addendum)
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  This will be repeated by 2D echo on annual basis

## 2019-11-27 NOTE — Assessment & Plan Note (Signed)
History of endoluminal stent grafting in 2006 at [facility].  His last Doppler performed 01/01/2019 revealed a residual aneurysm sac of 4.2 cm.  This will be repeated in July.

## 2019-11-27 NOTE — Progress Notes (Signed)
11/27/2019 Earleen Reaper   [birth year]  [id number]  Primary Physician Dema Severin, NP Primary Cardiologist: Runell Gess MD Milagros Loll, [place], [place]  HPI:  Jamarie Mussa is a 79 y.o.  mildly overweight married Caucasian male father of 2, grandfather to 3 grandchildren who I last saw  08/26/2019.Marland Kitchen He was previously a patient of Dr. Pierre Bali who had done LAD and diagonal branch stenting June 08, 2004. He had normal LV function at that time. He had re-stenting at [facility] subsequent to that. He also had an Endoluminal stent graft placed at [facility] in 2006 for an abdominal aortic aneurysm. He was last ultrasounded 2 years ago and had no evidence of endoleak. His other problems include hypertension and hyperlipidemia. He denies chest pain or shortness of breath.when I saw him a year ago he was complaining of some lower extremity discomfort with ambulation. Dopplers performed 01/01/14 revealed no evidence of obstructive disease. He apparently needs back surgery. A pharmacologic Myoview stress test done for preoperative clearance was read as high risk with severe anteroseptal and apical ischemia.Because of this he underwent outpatient cardiac catheterization by myself performed radially on 10/13/15 revealing a widely patent LAD and diagonal branch stent. He did have a 60% lesion in the mid dominant RCA which did not appear flow-limiting. He has back surgery successfully without complication after that.  Since I saw him in the office 3 months ago he continues to do well.  He was found to be in A. fib with controlled ventricular sponsor I saw him 3 months ago but he was unaware of this.  I did begin him on Eliquis oral anticoagulation, discontinue his Plavix and I am going to decrease his aspirin from 3 25-81.  He denies chest pain or shortness of breath.  2D echo revealed normal LV systolic function with a 44 mm ascending thoracic aortic aneurysm.   Current Meds  Medication Sig  .  amLODipine (NORVASC) 10 MG tablet TAKE 1 TABLET(10 MG) BY MOUTH DAILY  . apixaban (ELIQUIS) 5 MG TABS tablet Take 2 tablets (10mg ) twice daily for 7 days, then 1 tablet (5mg ) twice daily  . aspirin EC 81 MG tablet Take 81 mg by mouth daily.  . clopidogrel (PLAVIX) 75 MG tablet TAKE 1 TABLET BY MOUTH DAILY  . HYDROcodone-acetaminophen (NORCO/VICODIN) 5-325 MG tablet Take 1 tablet by mouth every 4 (four) hours as needed for moderate pain.  . metoprolol tartrate (LOPRESSOR) 25 MG tablet TAKE 1 TABLET BY MOUTH DAILY  . pantoprazole (PROTONIX) 40 MG tablet TAKE 1 TABLET(40 MG) BY MOUTH DAILY  . [DISCONTINUED] aspirin 325 MG EC tablet Take 325 mg by mouth daily.     Allergies  Allergen Reactions  . Crestor [Rosuvastatin] Other (See Comments)    Severe pain  . Lipitor [Atorvastatin] Other (See Comments)    Severe pain    Social History   Socioeconomic History  . Marital status: Married    Spouse name: Not on file  . Number of children: Not on file  . Years of education: Not on file  . Highest education level: Not on file  Occupational History  . Not on file  Tobacco Use  . Smoking status: Never Smoker  . Smokeless tobacco: Never Used  Substance and Sexual Activity  . Alcohol use: No  . Drug use: No  . Sexual activity: Not on file  Other Topics Concern  . Not on file  Social History Narrative  . Not on file  Social Determinants of Health   Financial Resource Strain:   . Difficulty of Paying Living Expenses:   Food Insecurity:   . Worried About Charity fundraiser in the Last Year:   . Arboriculturist in the Last Year:   Transportation Needs:   . Film/video editor (Medical):   Marland Kitchen Lack of Transportation (Non-Medical):   Physical Activity:   . Days of Exercise per Week:   . Minutes of Exercise per Session:   Stress:   . Feeling of Stress :   Social Connections:   . Frequency of Communication with Friends and Family:   . Frequency of Social Gatherings with Friends  and Family:   . Attends Religious Services:   . Active Member of Clubs or Organizations:   . Attends Archivist Meetings:   Marland Kitchen Marital Status:   Intimate Partner Violence:   . Fear of Current or Ex-Partner:   . Emotionally Abused:   Marland Kitchen Physically Abused:   . Sexually Abused:      Review of Systems: General: negative for chills, fever, night sweats or weight changes.  Cardiovascular: negative for chest pain, dyspnea on exertion, edema, orthopnea, palpitations, paroxysmal nocturnal dyspnea or shortness of breath Dermatological: negative for rash Respiratory: negative for cough or wheezing Urologic: negative for hematuria Abdominal: negative for nausea, vomiting, diarrhea, bright red blood per rectum, melena, or hematemesis Neurologic: negative for visual changes, syncope, or dizziness All other systems reviewed and are otherwise negative except as noted above.    Blood pressure 140/80, pulse 74, height 6\' 2"  (1.88 m), weight 229 lb (103.9 kg), SpO2 91 %.  General appearance: alert and no distress Neck: no adenopathy, no carotid bruit, no JVD, supple, symmetrical, trachea midline and thyroid not enlarged, symmetric, no tenderness/mass/nodules Lungs: clear to auscultation bilaterally Heart: irregularly irregular rhythm Extremities: extremities normal, atraumatic, no cyanosis or edema Pulses: 2+ and symmetric Skin: Skin color, texture, turgor normal. No rashes or lesions Neurologic: Alert and oriented X 3, normal strength and tone. Normal symmetric reflexes. Normal coordination and gait  EKG atrial fibrillation with a ventricular sponsor of 57.  I Personally reviewed this EKG.  ASSESSMENT AND PLAN:   Atrial fibrillation (HCC) Asymptomatic persistent A. fib rate controlled on Eliquis oral anticoagulation.  I am going to continue rate control and not pursue cardioversion at this time.  Did tell him to stop his Plavix since he was on Eliquis.  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  This will be repeated by 2D echo on annual basis  AAA (abdominal aortic aneurysm) without rupture ([place]) History of endoluminal stent grafting in 2006 at [facility].  His last Doppler performed 01/01/2019 revealed a residual aneurysm sac of 4.2 cm.  This will be repeated in July.      Lorretta Harp MD FACP,FACC,FAHA, [facility] 11/27/2019 10:29 AM

## 2019-11-27 NOTE — Assessment & Plan Note (Signed)
Asymptomatic persistent A. fib rate controlled on Eliquis oral anticoagulation.  I am going to continue rate control and not pursue cardioversion at this time.  Did tell him to stop his Plavix since he was on Eliquis.  We will decrease his aspirin dose from 3 25-81

## 2019-12-30 ENCOUNTER — Ambulatory Visit (HOSPITAL_COMMUNITY)
Admission: RE | Admit: 2019-12-30 | Discharge: 2019-12-30 | Disposition: A | Discharge status: Home / Self Care | Payer: Medicare Other | Source: Ambulatory Visit | Attending: Cardiology | Admitting: Cardiology

## 2019-12-30 ENCOUNTER — Other Ambulatory Visit (HOSPITAL_COMMUNITY): Payer: Self-pay | Admitting: Cardiovascular Disease

## 2019-12-30 ENCOUNTER — Other Ambulatory Visit: Payer: Self-pay

## 2019-12-30 DIAGNOSIS — E782 Mixed hyperlipidemia: Secondary | ICD-10-CM

## 2019-12-30 DIAGNOSIS — I10 Essential (primary) hypertension: Secondary | ICD-10-CM

## 2019-12-30 DIAGNOSIS — I2510 Atherosclerotic heart disease of native coronary artery without angina pectoris: Secondary | ICD-10-CM

## 2019-12-30 DIAGNOSIS — I714 Abdominal aortic aneurysm, without rupture, unspecified: Secondary | ICD-10-CM

## 2019-12-30 DIAGNOSIS — I482 Chronic atrial fibrillation, unspecified: Secondary | ICD-10-CM

## 2020-01-06 ENCOUNTER — Other Ambulatory Visit: Payer: Self-pay

## 2020-01-06 DIAGNOSIS — I714 Abdominal aortic aneurysm, without rupture, unspecified: Secondary | ICD-10-CM

## 2020-01-06 DIAGNOSIS — I712 Thoracic aortic aneurysm, without rupture, unspecified: Secondary | ICD-10-CM

## 2020-01-06 NOTE — Progress Notes (Signed)
vas 

## 2020-05-25 DIAGNOSIS — I639 Cerebral infarction, unspecified: Secondary | ICD-10-CM

## 2020-05-25 HISTORY — DX: Cerebral infarction, unspecified: I63.9

## 2020-05-27 ENCOUNTER — Other Ambulatory Visit: Payer: Self-pay

## 2020-05-27 ENCOUNTER — Encounter: Payer: Self-pay | Admitting: Cardiovascular Disease

## 2020-05-27 ENCOUNTER — Ambulatory Visit (INDEPENDENT_AMBULATORY_CARE_PROVIDER_SITE_OTHER): Payer: Medicare Other | Admitting: Cardiovascular Disease

## 2020-05-27 VITALS — BP 140/81 | HR 66 | Ht 74.0 in | Wt 229.8 lb

## 2020-05-27 DIAGNOSIS — I714 Abdominal aortic aneurysm, without rupture, unspecified: Secondary | ICD-10-CM

## 2020-05-27 DIAGNOSIS — E782 Mixed hyperlipidemia: Secondary | ICD-10-CM

## 2020-05-27 DIAGNOSIS — I2510 Atherosclerotic heart disease of native coronary artery without angina pectoris: Secondary | ICD-10-CM | POA: Diagnosis not present

## 2020-05-27 DIAGNOSIS — I712 Thoracic aortic aneurysm, without rupture, unspecified: Secondary | ICD-10-CM

## 2020-05-27 DIAGNOSIS — I482 Chronic atrial fibrillation, unspecified: Secondary | ICD-10-CM | POA: Diagnosis not present

## 2020-05-27 DIAGNOSIS — I10 Essential (primary) hypertension: Secondary | ICD-10-CM | POA: Diagnosis not present

## 2020-05-27 LAB — LIPID PANEL
Chol/HDL Ratio: 4 ratio (ref 0.0–5.0)
Cholesterol, Total: 129 mg/dL (ref 100–199)
HDL: 32 mg/dL — ABNORMAL LOW (ref >39)
LDL Chol Calc (NIH): 84 mg/dL (ref 0–99)
Triglycerides: 64 mg/dL (ref 0–149)
VLDL Cholesterol Cal: 13 mg/dL (ref 5–40)

## 2020-05-27 LAB — HEPATIC FUNCTION PANEL
ALT: 17 IU/L (ref 0–44)
AST: 24 IU/L (ref 0–40)
Albumin: 3.8 g/dL (ref 3.7–4.7)
Alkaline Phosphatase: 90 IU/L (ref 44–121)
Bilirubin Total: 0.7 mg/dL (ref 0.0–1.2)
Bilirubin, Direct: 0.24 mg/dL (ref 0.00–0.40)
Total Protein: 6.7 g/dL (ref 6.0–8.5)

## 2020-05-27 NOTE — Assessment & Plan Note (Signed)
History of hyperlipidemia not on statin therapy because of "statin intolerance with lipid profile performed 08/26/2019 revealing total cholesterol 139, LDL 91 and HDL 30.  We will recheck a lipid liver profile this morning.

## 2020-05-27 NOTE — Patient Instructions (Addendum)
Medication Instructions:  Your physician recommends that you continue on your current medications as directed. Please refer to the Current Medication list given to you today.  *If you need a refill on your cardiac medications before your next appointment, please call your pharmacy*   Lab Work: Your physician recommends that you have labs done today: lipid/liver profile.  If you have labs (blood work) drawn today and your tests are completely normal, you will receive your results only by:  MyChart Message (if you have MyChart) OR  A paper copy in the mail If you have any lab test that is abnormal or we need to change your treatment, we will call you to review the results.  Procedures: Your physician has requested that you have an echocardiogram. Echocardiography is a painless test that uses sound waves to create images of your heart. It provides your doctor with information about the size and shape of your heart and how well your hearts chambers and valves are working. This procedure takes approximately one hour. There are no restrictions for this procedure. [address]  Follow-Up: At [facility], you and your health needs are our priority.  As part of our continuing mission to provide you with exceptional heart care, we have created designated Provider Care Teams.  These Care Teams include your primary Cardiologist (physician) and Advanced Practice Providers (APPs -  Physician Assistants and Nurse Practitioners) who all work together to provide you with the care you need, when you need it.  We recommend signing up for the patient portal called "MyChart".  Sign up information is provided on this After Visit Summary.  MyChart is used to connect with patients for Virtual Visits (Telemedicine).  Patients are able to view lab/test results, encounter notes, upcoming appointments, etc.  Non-urgent messages can be sent to your provider as well.   To learn more about what you  can do with MyChart, go to [email or web address].    Your next appointment:   6 month(s)  The format for your next appointment:   In Person  Provider:   You will see one of the following Advanced Practice Providers on your designated Care Team:    Corine Shelter, PA-C  [place], [place]  Edd Fabian, [place]  Then, Dr. Allyson Sabal will plan to see you again in 12 month(s).  ** VAS [place] AAA Duplex due in 12/2020

## 2020-05-27 NOTE — Assessment & Plan Note (Signed)
History of chronic A. fib rate controlled on Eliquis oral anticoagulation 

## 2020-05-27 NOTE — Assessment & Plan Note (Addendum)
History of CAD status post LAD diagonal branch intervention by Dr. Elsie Lincoln 06/08/2004.  He had restenting apparently at [facility] after that.  I performed cardiac catheterization on him 10/13/2015 after a Myoview stress test done for preoperative clearance was read as high risk with anteroseptal and apical ischemia.  This showed a widely patent LAD diagonal branch stent with a 60% stenosis in the mid dominant RCA but it did not appear to be flow-limiting.  He denies chest pain or shortness of breath.

## 2020-05-27 NOTE — Progress Notes (Signed)
05/27/2020 [name]   1941-04-12  [id number]  Primary Physician Dema Severin, NP Primary Cardiologist: Runell Gess MD Nicholes Calam[place], [place]  HPI:  [name] is a 79 y.o.    mildly overweight married Caucasian male father of 2, grandfather to 3 grandchildren who I last saw6/09/2019.Marland Kitchen He was previously a patient of Dr. Pierre Bali who had done LAD and diagonal branch stenting June 08, 2004. He had normal LV function at that time. He had re-stenting at [facility] subsequent to that. He also had an Endoluminal stent graft placed at [facility] in 2006 for an abdominal aortic aneurysm. He was last ultrasounded 2 years ago and had no evidence of endoleak. His other problems include hypertension and hyperlipidemia. He denies chest pain or shortness of breath.when I saw him a year ago he was complaining of some lower extremity discomfort with ambulation. Dopplers performed 01/01/14 revealed no evidence of obstructive disease. He apparently needs back surgery. A pharmacologic Myoview stress test done for preoperative clearance was read as high risk with severe anteroseptal and apical ischemia.Because of this he underwent outpatient cardiac catheterization by myself performed radially on 10/13/15 revealing a widely patent LAD and diagonal branch stent. He did have a 60% lesion in the mid dominant RCA which did not appear flow-limiting. He has back surgery successfully without complication after that.  When I saw him 6 months ago he was found to be in A. fib with controlled ventricular response.  He was unaware of this.  I did begin him on Eliquis oral anticoagulation, discontinued his Plavix and I am going to decrease his aspirin from 3 25-81.  He denies chest pain or shortness of breath.  2D echo revealed normal LV systolic function with a 44 mm ascending thoracic aortic aneurysm.  Since I saw him 6 months ago he continues to do well.  He remains on Eliquis.  He is unaware that he has had  A. fib.  He denies chest pain or shortness of breath.  He does say that he is statin intolerant.   Current Meds  Medication Sig  . amLODipine (NORVASC) 10 MG tablet TAKE 1 TABLET(10 MG) BY MOUTH DAILY  . apixaban (ELIQUIS) 5 MG TABS tablet Take 2 tablets (10mg ) twice daily for 7 days, then 1 tablet (5mg ) twice daily  . aspirin EC 81 MG tablet Take 81 mg by mouth daily.  . metoprolol tartrate (LOPRESSOR) 25 MG tablet TAKE 1 TABLET BY MOUTH DAILY  . pantoprazole (PROTONIX) 40 MG tablet TAKE 1 TABLET(40 MG) BY MOUTH DAILY  . [DISCONTINUED] clopidogrel (PLAVIX) 75 MG tablet TAKE 1 TABLET BY MOUTH DAILY  . [DISCONTINUED] HYDROcodone-acetaminophen (NORCO/VICODIN) 5-325 MG tablet Take 1 tablet by mouth every 4 (four) hours as needed for moderate pain.     Allergies  Allergen Reactions  . Crestor [Rosuvastatin] Other (See Comments)    Severe pain  . Lipitor [Atorvastatin] Other (See Comments)    Severe pain    Social History   Socioeconomic History  . Marital status: Married    Spouse name: Not on file  . Number of children: Not on file  . Years of education: Not on file  . Highest education level: Not on file  Occupational History  . Not on file  Tobacco Use  . Smoking status: Never Smoker  . Smokeless tobacco: Never Used  Substance and Sexual Activity  . Alcohol use: No  . Drug use: No  . Sexual activity: Not on file  Other Topics Concern  . Not on file  Social History Narrative  . Not on file   Social Determinants of Health   Financial Resource Strain:   . Difficulty of Paying Living Expenses: Not on file  Food Insecurity:   . Worried About Programme researcher, broadcasting/film/video in the Last Year: Not on file  . Ran Out of Food in the Last Year: Not on file  Transportation Needs:   . Lack of Transportation (Medical): Not on file  . Lack of Transportation (Non-Medical): Not on file  Physical Activity:   . Days of Exercise per Week: Not on file  . Minutes of Exercise per Session: Not on  file  Stress:   . Feeling of Stress : Not on file  Social Connections:   . Frequency of Communication with Friends and Family: Not on file  . Frequency of Social Gatherings with Friends and Family: Not on file  . Attends Religious Services: Not on file  . Active Member of Clubs or Organizations: Not on file  . Attends Banker Meetings: Not on file  . Marital Status: Not on file  Intimate Partner Violence:   . Fear of Current or Ex-Partner: Not on file  . Emotionally Abused: Not on file  . Physically Abused: Not on file  . Sexually Abused: Not on file     Review of Systems: General: negative for chills, fever, night sweats or weight changes.  Cardiovascular: negative for chest pain, dyspnea on exertion, edema, orthopnea, palpitations, paroxysmal nocturnal dyspnea or shortness of breath Dermatological: negative for rash Respiratory: negative for cough or wheezing Urologic: negative for hematuria Abdominal: negative for nausea, vomiting, diarrhea, bright red blood per rectum, melena, or hematemesis Neurologic: negative for visual changes, syncope, or dizziness All other systems reviewed and are otherwise negative except as noted above.    Blood pressure 140/81, pulse 66, height 6\' 2"  (1.88 m), weight 229 lb 12.8 oz (104.2 kg), SpO2 97 %.  General appearance: alert and no distress Neck: no adenopathy, no carotid bruit, no JVD, supple, symmetrical, trachea midline and thyroid not enlarged, symmetric, no tenderness/mass/nodules Lungs: clear to auscultation bilaterally Heart: irregularly irregular rhythm Extremities: extremities normal, atraumatic, no cyanosis or edema Pulses: 2+ and symmetric Skin: Skin color, texture, turgor normal. No rashes or lesions Neurologic: Alert and oriented X 3, normal strength and tone. Normal symmetric reflexes. Normal coordination and gait  EKG atrial fibrillation with a ventricular sponsor 66 minimal criteria for LVH.  I personally  reviewed this EKG.  ASSESSMENT AND PLAN:   HYPERTENSION, BENIGN SYSTEMIC History of essential hypertension blood pressure measured today 140/81.  He is on amlodipine and metoprolol.  Coronary atherosclerosis History of CAD status post LAD diagonal branch intervention by Dr. 06/08/2004.  He had restenting apparently at [facility] after that.  I performed cardiac catheterization on him 10/13/2015 after a Myoview stress test done for preoperative clearance was read as high risk with anteroseptal and apical ischemia.  This showed a widely patent LAD diagonal branch stent with a 60% stenosis in the mid dominant RCA but it did not appear to be flow-limiting.  He denies chest pain or shortness of breath.  Thoracic aortic aneurysm [facility]) History of thoracic aortic aneurysm measuring 44 mm by recent 2D echo performed 09/14/2019.  This will be repeated on annual basis.  Hyperlipidemia History of hyperlipidemia not on statin therapy because of "statin intolerance with lipid profile performed 08/26/2019 revealing total cholesterol 139, LDL 91 and HDL 30.  We will recheck a lipid liver profile this morning.  Atrial fibrillation (HCC) History of chronic A. fib rate controlled on Eliquis oral anticoagulation.  AAA (abdominal aortic aneurysm) without rupture (HCC) History of abdominal aortic aneurysm status post EVAR at [facility] in 2006.  His most recent Doppler study performed 12/30/2019 revealed an aneurysm size and remained stable at 4.2 x 4.4 with no evidence of endoleak seen.  We will repeat his abdominal ultrasound in July 2022.      Runell Gess MD FACP,FACC,FAHA, [facility] 05/27/2020 9:28 AM

## 2020-05-27 NOTE — Assessment & Plan Note (Signed)
History of abdominal aortic aneurysm status post EVAR at [facility] in 2006.  His most recent Doppler study performed 12/30/2019 revealed an aneurysm size and remained stable at 4.2 x 4.4 with no evidence of endoleak seen.  We will repeat his abdominal ultrasound in July 2022.

## 2020-05-27 NOTE — Assessment & Plan Note (Signed)
History of essential hypertension blood pressure measured today 140/81.  He is on amlodipine and metoprolol.

## 2020-05-27 NOTE — Assessment & Plan Note (Signed)
History of thoracic aortic aneurysm measuring 44 mm by recent 2D echo performed 09/14/2019.  This will be repeated on annual basis.

## 2020-06-10 ENCOUNTER — Inpatient Hospital Stay (HOSPITAL_COMMUNITY): Payer: Medicare Other

## 2020-06-10 ENCOUNTER — Inpatient Hospital Stay (HOSPITAL_COMMUNITY)
Admission: EM | Admit: 2020-06-10 | Discharge: 2020-06-16 | DRG: 065 | Disposition: A | Discharge status: Rehabilitation Facility | Payer: Medicare Other | Attending: Neurology | Admitting: Neurology

## 2020-06-10 ENCOUNTER — Emergency Department (HOSPITAL_COMMUNITY): Payer: Medicare Other

## 2020-06-10 ENCOUNTER — Other Ambulatory Visit: Payer: Self-pay

## 2020-06-10 DIAGNOSIS — I6503 Occlusion and stenosis of bilateral vertebral arteries: Secondary | ICD-10-CM | POA: Diagnosis present

## 2020-06-10 DIAGNOSIS — R7303 Prediabetes: Secondary | ICD-10-CM | POA: Diagnosis present

## 2020-06-10 DIAGNOSIS — I6522 Occlusion and stenosis of left carotid artery: Secondary | ICD-10-CM | POA: Diagnosis present

## 2020-06-10 DIAGNOSIS — I6389 Other cerebral infarction: Secondary | ICD-10-CM | POA: Diagnosis not present

## 2020-06-10 DIAGNOSIS — Y92009 Unspecified place in unspecified non-institutional (private) residence as the place of occurrence of the external cause: Secondary | ICD-10-CM | POA: Diagnosis not present

## 2020-06-10 DIAGNOSIS — Z955 Presence of coronary angioplasty implant and graft: Secondary | ICD-10-CM | POA: Diagnosis not present

## 2020-06-10 DIAGNOSIS — I161 Hypertensive emergency: Secondary | ICD-10-CM

## 2020-06-10 DIAGNOSIS — I2510 Atherosclerotic heart disease of native coronary artery without angina pectoris: Secondary | ICD-10-CM | POA: Diagnosis present

## 2020-06-10 DIAGNOSIS — I613 Nontraumatic intracerebral hemorrhage in brain stem: Principal | ICD-10-CM | POA: Diagnosis present

## 2020-06-10 DIAGNOSIS — R1312 Dysphagia, oropharyngeal phase: Secondary | ICD-10-CM | POA: Diagnosis not present

## 2020-06-10 DIAGNOSIS — I700 Atherosclerosis of aorta: Secondary | ICD-10-CM

## 2020-06-10 DIAGNOSIS — I10 Essential (primary) hypertension: Secondary | ICD-10-CM | POA: Diagnosis present

## 2020-06-10 DIAGNOSIS — E876 Hypokalemia: Secondary | ICD-10-CM | POA: Diagnosis present

## 2020-06-10 DIAGNOSIS — Z7982 Long term (current) use of aspirin: Secondary | ICD-10-CM

## 2020-06-10 DIAGNOSIS — G8191 Hemiplegia, unspecified affecting right dominant side: Secondary | ICD-10-CM | POA: Diagnosis present

## 2020-06-10 DIAGNOSIS — K219 Gastro-esophageal reflux disease without esophagitis: Secondary | ICD-10-CM | POA: Diagnosis present

## 2020-06-10 DIAGNOSIS — Z820 Family history of epilepsy and other diseases of the nervous system: Secondary | ICD-10-CM

## 2020-06-10 DIAGNOSIS — Z20822 Contact with and (suspected) exposure to covid-19: Secondary | ICD-10-CM | POA: Diagnosis present

## 2020-06-10 DIAGNOSIS — D689 Coagulation defect, unspecified: Secondary | ICD-10-CM | POA: Diagnosis present

## 2020-06-10 DIAGNOSIS — R471 Dysarthria and anarthria: Secondary | ICD-10-CM | POA: Diagnosis present

## 2020-06-10 DIAGNOSIS — E7800 Pure hypercholesterolemia, unspecified: Secondary | ICD-10-CM | POA: Diagnosis not present

## 2020-06-10 DIAGNOSIS — G4733 Obstructive sleep apnea (adult) (pediatric): Secondary | ICD-10-CM | POA: Diagnosis present

## 2020-06-10 DIAGNOSIS — I482 Chronic atrial fibrillation, unspecified: Secondary | ICD-10-CM | POA: Diagnosis present

## 2020-06-10 DIAGNOSIS — Z7901 Long term (current) use of anticoagulants: Secondary | ICD-10-CM | POA: Diagnosis not present

## 2020-06-10 DIAGNOSIS — I69391 Dysphagia following cerebral infarction: Secondary | ICD-10-CM | POA: Diagnosis not present

## 2020-06-10 DIAGNOSIS — Z8249 Family history of ischemic heart disease and other diseases of the circulatory system: Secondary | ICD-10-CM | POA: Diagnosis not present

## 2020-06-10 DIAGNOSIS — I350 Nonrheumatic aortic (valve) stenosis: Secondary | ICD-10-CM

## 2020-06-10 DIAGNOSIS — W06XXXA Fall from bed, initial encounter: Secondary | ICD-10-CM | POA: Diagnosis present

## 2020-06-10 DIAGNOSIS — Z79899 Other long term (current) drug therapy: Secondary | ICD-10-CM | POA: Diagnosis not present

## 2020-06-10 DIAGNOSIS — I351 Nonrheumatic aortic (valve) insufficiency: Secondary | ICD-10-CM | POA: Diagnosis not present

## 2020-06-10 DIAGNOSIS — I610 Nontraumatic intracerebral hemorrhage in hemisphere, subcortical: Secondary | ICD-10-CM | POA: Diagnosis not present

## 2020-06-10 DIAGNOSIS — E785 Hyperlipidemia, unspecified: Secondary | ICD-10-CM | POA: Diagnosis present

## 2020-06-10 DIAGNOSIS — I4891 Unspecified atrial fibrillation: Secondary | ICD-10-CM | POA: Diagnosis present

## 2020-06-10 DIAGNOSIS — I712 Thoracic aortic aneurysm, without rupture, unspecified: Secondary | ICD-10-CM | POA: Diagnosis present

## 2020-06-10 DIAGNOSIS — Z888 Allergy status to other drugs, medicaments and biological substances status: Secondary | ICD-10-CM | POA: Diagnosis not present

## 2020-06-10 DIAGNOSIS — D696 Thrombocytopenia, unspecified: Secondary | ICD-10-CM | POA: Diagnosis present

## 2020-06-10 DIAGNOSIS — R270 Ataxia, unspecified: Secondary | ICD-10-CM | POA: Diagnosis present

## 2020-06-10 DIAGNOSIS — I619 Nontraumatic intracerebral hemorrhage, unspecified: Secondary | ICD-10-CM | POA: Diagnosis present

## 2020-06-10 LAB — URINALYSIS, COMPLETE (UACMP) WITH MICROSCOPIC
Bacteria, UA: NONE SEEN
Bilirubin Urine: NEGATIVE
Glucose, UA: NEGATIVE mg/dL
Hgb urine dipstick: NEGATIVE
Ketones, ur: NEGATIVE mg/dL
Leukocytes,Ua: NEGATIVE
Nitrite: NEGATIVE
Protein, ur: NEGATIVE mg/dL
Specific Gravity, Urine: 1.036 — ABNORMAL HIGH (ref 1.005–1.030)
pH: 5 (ref 5.0–8.0)

## 2020-06-10 LAB — SODIUM
Sodium: 136 mmol/L (ref 135–145)
Sodium: 141 mmol/L (ref 135–145)

## 2020-06-10 LAB — MRSA PCR SCREENING: MRSA by PCR: POSITIVE — AB

## 2020-06-10 LAB — ECHOCARDIOGRAM COMPLETE
AR max vel: 1.74 cm2
AV Area VTI: 1.95 cm2
AV Area mean vel: 1.78 cm2
AV Mean grad: 7.7 mmHg
AV Peak grad: 13.9 mmHg
Ao pk vel: 1.86 m/s
P 1/2 time: 604 msec
S' Lateral: 3.1 cm
Weight: 3492.09 oz

## 2020-06-10 LAB — COMPREHENSIVE METABOLIC PANEL
ALT: 23 U/L (ref 0–44)
AST: 31 U/L (ref 15–41)
Albumin: 3 g/dL — ABNORMAL LOW (ref 3.5–5.0)
Alkaline Phosphatase: 58 U/L (ref 38–126)
Anion gap: 15 (ref 5–15)
BUN: 20 mg/dL (ref 8–23)
CO2: 22 mmol/L (ref 22–32)
Calcium: 8.8 mg/dL — ABNORMAL LOW (ref 8.9–10.3)
Chloride: 102 mmol/L (ref 98–111)
Creatinine, Ser: 1.22 mg/dL (ref 0.61–1.24)
GFR, Estimated: >60 mL/min (ref >60)
Glucose, Bld: 128 mg/dL — ABNORMAL HIGH (ref 70–99)
Potassium: 3.4 mmol/L — ABNORMAL LOW (ref 3.5–5.1)
Sodium: 139 mmol/L (ref 135–145)
Total Bilirubin: 1.5 mg/dL — ABNORMAL HIGH (ref 0.3–1.2)
Total Protein: 6.6 g/dL (ref 6.5–8.1)

## 2020-06-10 LAB — CBC
HCT: 47 % (ref 39.0–52.0)
Hemoglobin: 15.9 g/dL (ref 13.0–17.0)
MCH: 30.4 pg (ref 26.0–34.0)
MCHC: 33.8 g/dL (ref 30.0–36.0)
MCV: 89.9 fL (ref 80.0–100.0)
Platelets: 120 10*3/uL — ABNORMAL LOW (ref 150–400)
RBC: 5.23 MIL/uL (ref 4.22–5.81)
RDW: 13.7 % (ref 11.5–15.5)
WBC: 6 10*3/uL (ref 4.0–10.5)
nRBC: 0 % (ref 0.0–0.2)

## 2020-06-10 LAB — DIFFERENTIAL
Abs Immature Granulocytes: 0.02 10*3/uL (ref 0.00–0.07)
Basophils Absolute: 0 10*3/uL (ref 0.0–0.1)
Basophils Relative: 0 %
Eosinophils Absolute: 0 10*3/uL (ref 0.0–0.5)
Eosinophils Relative: 1 %
Immature Granulocytes: 0 %
Lymphocytes Relative: 22 %
Lymphs Abs: 1.3 10*3/uL (ref 0.7–4.0)
Monocytes Absolute: 0.6 10*3/uL (ref 0.1–1.0)
Monocytes Relative: 9 %
Neutro Abs: 4 10*3/uL (ref 1.7–7.7)
Neutrophils Relative %: 68 %

## 2020-06-10 LAB — I-STAT CHEM 8, ED
BUN: 23 mg/dL (ref 8–23)
Calcium, Ion: 1.13 mmol/L — ABNORMAL LOW (ref 1.15–1.40)
Chloride: 103 mmol/L (ref 98–111)
Creatinine, Ser: 1.1 mg/dL (ref 0.61–1.24)
Glucose, Bld: 123 mg/dL — ABNORMAL HIGH (ref 70–99)
HCT: 48 % (ref 39.0–52.0)
Hemoglobin: 16.3 g/dL (ref 13.0–17.0)
Potassium: 3.3 mmol/L — ABNORMAL LOW (ref 3.5–5.1)
Sodium: 139 mmol/L (ref 135–145)
TCO2: 23 mmol/L (ref 22–32)

## 2020-06-10 LAB — GLUCOSE, CAPILLARY: Glucose-Capillary: 156 mg/dL — ABNORMAL HIGH (ref 70–99)

## 2020-06-10 LAB — RAPID URINE DRUG SCREEN, HOSP PERFORMED
Amphetamines: NOT DETECTED
Barbiturates: NOT DETECTED
Benzodiazepines: NOT DETECTED
Cocaine: NOT DETECTED
Opiates: NOT DETECTED
Tetrahydrocannabinol: NOT DETECTED

## 2020-06-10 LAB — CBG MONITORING, ED: Glucose-Capillary: 117 mg/dL — ABNORMAL HIGH (ref 70–99)

## 2020-06-10 LAB — RESP PANEL BY RT-PCR (FLU A&B, COVID) ARPGX2
Influenza A by PCR: NEGATIVE
Influenza B by PCR: NEGATIVE
SARS Coronavirus 2 by RT PCR: NEGATIVE

## 2020-06-10 LAB — PROTIME-INR
INR: 1.2 (ref 0.8–1.2)
Prothrombin Time: 14.4 seconds (ref 11.4–15.2)

## 2020-06-10 LAB — ETHANOL: Alcohol, Ethyl (B): <10 mg/dL (ref <10)

## 2020-06-10 LAB — TROPONIN I (HIGH SENSITIVITY)
Troponin I (High Sensitivity): 10 ng/L (ref <18)
Troponin I (High Sensitivity): 10 ng/L (ref <18)

## 2020-06-10 LAB — APTT: aPTT: 30 seconds (ref 24–36)

## 2020-06-10 MED ORDER — POTASSIUM CHLORIDE 10 MEQ/100ML IV SOLN: 10.0000 meq | INTRAVENOUS | Status: DC

## 2020-06-10 MED ORDER — IOHEXOL 350 MG/ML SOLN
95.0000 mL | Freq: Once | INTRAVENOUS | Status: AC | PRN
  Administered 2020-06-10: 95 mL via INTRAVENOUS

## 2020-06-10 MED ORDER — CHLORHEXIDINE GLUCONATE CLOTH 2 % EX PADS
6.0000 | MEDICATED_PAD | Freq: Every day | CUTANEOUS | Status: DC
  Administered 2020-06-12: 16:00:00 6 via TOPICAL

## 2020-06-10 MED ORDER — SODIUM CHLORIDE 0.9 % IV SOLN: INTRAVENOUS | Status: DC

## 2020-06-10 MED ORDER — CHLORHEXIDINE GLUCONATE CLOTH 2 % EX PADS
6.0000 | MEDICATED_PAD | Freq: Every day | CUTANEOUS | Status: DC
  Administered 2020-06-10 – 2020-06-15 (×5): 6 via TOPICAL

## 2020-06-10 MED ORDER — ACETAMINOPHEN 160 MG/5ML PO SOLN
650.0000 mg | ORAL | Status: DC | PRN
  Administered 2020-06-12: 650 mg
  Filled 2020-06-10: qty 20.3

## 2020-06-10 MED ORDER — POTASSIUM CHLORIDE 10 MEQ/100ML IV SOLN
10.0000 meq | INTRAVENOUS | Status: DC
  Filled 2020-06-10: qty 100

## 2020-06-10 MED ORDER — SODIUM CHLORIDE 3 % IV SOLN
INTRAVENOUS | Status: DC
  Filled 2020-06-10 (×3): qty 500

## 2020-06-10 MED ORDER — MUPIROCIN 2 % EX OINT
1.0000 "application " | TOPICAL_OINTMENT | Freq: Two times a day (BID) | CUTANEOUS | Status: AC
  Administered 2020-06-10 – 2020-06-15 (×10): 1 via NASAL
  Filled 2020-06-10 (×2): qty 22

## 2020-06-10 MED ORDER — ACETAMINOPHEN 650 MG RE SUPP: 650.0000 mg | RECTAL | Status: DC | PRN

## 2020-06-10 MED ORDER — PANTOPRAZOLE SODIUM 40 MG IV SOLR
40.0000 mg | Freq: Every day | INTRAVENOUS | Status: DC
  Administered 2020-06-10: 22:00:00 40 mg via INTRAVENOUS
  Filled 2020-06-10: qty 40

## 2020-06-10 MED ORDER — STROKE: EARLY STAGES OF RECOVERY BOOK
Freq: Once | Status: AC
  Administered 2020-06-11: 10:00:00 1
  Filled 2020-06-10: qty 1

## 2020-06-10 MED ORDER — AMLODIPINE BESYLATE 5 MG PO TABS: 10.0000 mg | ORAL_TABLET | Freq: Every day | ORAL | Status: DC

## 2020-06-10 MED ORDER — PROTHROMBIN COMPLEX CONC HUMAN 500 UNITS IV KIT
5387.0000 [IU] | PACK | Status: AC
  Administered 2020-06-10: 07:00:00 5387 [IU] via INTRAVENOUS
  Filled 2020-06-10: qty 5387

## 2020-06-10 MED ORDER — METOPROLOL TARTRATE 25 MG PO TABS: 25.0000 mg | ORAL_TABLET | Freq: Every day | ORAL | Status: DC

## 2020-06-10 MED ORDER — POTASSIUM CHLORIDE 10 MEQ/100ML IV SOLN
10.0000 meq | INTRAVENOUS | Status: AC
  Administered 2020-06-10 (×2): 10 meq via INTRAVENOUS

## 2020-06-10 MED ORDER — ACETAMINOPHEN 325 MG PO TABS
650.0000 mg | ORAL_TABLET | ORAL | Status: DC | PRN
  Administered 2020-06-10 – 2020-06-15 (×3): 650 mg via ORAL
  Filled 2020-06-10 (×3): qty 2

## 2020-06-10 MED ORDER — SENNOSIDES-DOCUSATE SODIUM 8.6-50 MG PO TABS
1.0000 | ORAL_TABLET | Freq: Two times a day (BID) | ORAL | Status: DC
  Administered 2020-06-11 – 2020-06-16 (×10): 1 via ORAL
  Filled 2020-06-10 (×10): qty 1

## 2020-06-10 MED ORDER — CLEVIDIPINE BUTYRATE 0.5 MG/ML IV EMUL
0.0000 mg/h | INTRAVENOUS | Status: DC
  Administered 2020-06-10: 07:00:00 1 mg/h via INTRAVENOUS
  Administered 2020-06-10: 17 mg/h via INTRAVENOUS
  Administered 2020-06-10: 12:00:00 3 mg/h via INTRAVENOUS
  Administered 2020-06-10: 16:00:00 4 mg/h via INTRAVENOUS
  Administered 2020-06-10: 21:00:00 17 mg/h via INTRAVENOUS
  Administered 2020-06-10: 15:00:00 3.5 mg/h via INTRAVENOUS
  Administered 2020-06-10: 19:00:00 16 mg/h via INTRAVENOUS
  Administered 2020-06-10: 22:00:00 17 mg/h via INTRAVENOUS
  Administered 2020-06-11: 10:00:00 21 mg/h via INTRAVENOUS
  Administered 2020-06-11: 04:00:00 14 mg/h via INTRAVENOUS
  Administered 2020-06-11 (×2): 21 mg/h via INTRAVENOUS
  Administered 2020-06-11: 16:00:00 10 mg/h via INTRAVENOUS
  Administered 2020-06-11: 02:00:00 17 mg/h via INTRAVENOUS
  Administered 2020-06-11: 07:00:00 12 mg/h via INTRAVENOUS
  Administered 2020-06-11: 15:00:00 21 mg/h via INTRAVENOUS
  Administered 2020-06-12: 05:00:00 8 mg/h via INTRAVENOUS
  Filled 2020-06-10: qty 100
  Filled 2020-06-10: qty 50
  Filled 2020-06-10: qty 100
  Filled 2020-06-10: qty 50
  Filled 2020-06-10 (×2): qty 100
  Filled 2020-06-10: qty 50
  Filled 2020-06-10: qty 100
  Filled 2020-06-10: qty 50
  Filled 2020-06-10 (×2): qty 100
  Filled 2020-06-10 (×3): qty 50

## 2020-06-10 NOTE — Consult Note (Addendum)
Referring Physician: Dr. Bernette Mayers    Chief Complaint: Acute onset of right sided weakness  HPI: [name] is an 79 y.o. male with a history of atrial fibrillation (on Eliquis), AAA s/p stent graft in 2006, CAD, shingles x 2, HLD, HTN, sleep apnea and vertigo who presents from home as a Code Stroke for acute onset of right sided weakness. LKN at 11 PM when he went to bed. At 5 AM he tried to get out of bed and fell. Wife noted that he was not acting right and that his speech was abnormal. She called EMS. On arrival to the ED he had severe weakness of his RUE and RLE, with facial droop and severe dysarthria. His last dose of Eliquis was at 8 AM yesterday; he missed his evening dose.   CT head shows an acute hemorrhage in the ventral pons. .   LSN: 11 PM tPA Given: No: Pontine hemorrhage ICH score: 1  Past Medical History:  Diagnosis Date  . Abdominal aortic aneurysm [facility])    status post endoluminal stent graft at [facility] in 2006  . Abdominal aortic aneurysm (HCC) 2006  . Arthritis   . Coronary artery disease   . GERD (gastroesophageal reflux disease)   . History of shingles    TIMES 2  . Hyperlipidemia   . Hypertension   . MRSA (methicillin resistant Staphylococcus aureus) infection   . Numbness    right leg from knee down worse when standing   . PONV (postoperative nausea and vomiting)   . Sleep apnea    pt scored 5 per Stop Bang tool at PAT visit 10/26/2015; results sent to [facility] NP  . Vertigo     Past Surgical History:  Procedure Laterality Date  . ABDOMINAL AORTIC ANEURYSM REPAIR W/ ENDOLUMINAL GRAFT     13 years ago   . ANGIOPLASTY    . branch stenting  06/08/2004   Dr. Lavonne Chick  . CARDIAC CATHETERIZATION  06/08/2004  . CARDIOVASCULAR STRESS TEST  02/14/2009  . I&D of left knee     . LUMBAR LAMINECTOMY/DECOMPRESSION MICRODISCECTOMY N/A 11/02/2015   Procedure: MICROLUMBAR DECOMPRESSION L3-L4, L4-L5, AND L5-S1;  Surgeon: Jene Every, MD;  Location: [facility];   Service: Orthopedics;  Laterality: N/A;  . PICC LINE PLACE PERIPHERAL ([facility] HX)    . TRANSTHORACIC ECHOCARDIOGRAM  06/07/2004    Family History  Problem Relation Age of Onset  . Alzheimer's disease Sister   . Heart failure Maternal Grandmother   . Heart failure Maternal Grandfather   . Heart failure Paternal Grandmother   . Heart failure Paternal Grandfather   . Cancer Sister    Social History:  reports that he has never smoked. He has never used smokeless tobacco. He reports that he does not drink alcohol and does not use drugs.  Allergies:  Allergies  Allergen Reactions  . Crestor [Rosuvastatin] Other (See Comments)    Severe pain  . Lipitor [Atorvastatin] Other (See Comments)    Severe pain    Home Medications:  No current facility-administered medications on file prior to encounter.   Current Outpatient Medications on File Prior to Encounter  Medication Sig Dispense Refill  . amLODipine (NORVASC) 10 MG tablet TAKE 1 TABLET(10 MG) BY MOUTH DAILY 90 tablet 3  . apixaban (ELIQUIS) 5 MG TABS tablet Take 2 tablets (10mg ) twice daily for 7 days, then 1 tablet (5mg ) twice daily 180 tablet 1  . aspirin EC 81 MG tablet Take 81 mg by mouth  daily.    . metoprolol tartrate (LOPRESSOR) 25 MG tablet TAKE 1 TABLET BY MOUTH DAILY 30 tablet 1  . pantoprazole (PROTONIX) 40 MG tablet TAKE 1 TABLET(40 MG) BY MOUTH DAILY 90 tablet 3    ROS: As per HPI. Denies any additional symptoms.   Physical Examination: There were no vitals taken for this visit.  HEENT: [place]/AT Lungs: Respirations unlabored Ext: No cyanosis or pallor. Trophic changes to BLE below knees.   Neurologic Examination: Mental Status: Awake and alert, fully oriented, thought content appropriate.  Speech slowed and severely dysarthric, but fluent. Comprehension intact. Able to follow all commands. Naming intact.  Cranial Nerves: II:  Visual fields intact bilaterally. PERRL.  III,IV, VI: Right sided ptosis.  EOMI with  saccadic pursuits noted.  V,VII: Prominent right facial droop. Temp sensation intact bilaterally.  VIII: hearing intact to voice IX,X: No hoarseness XI: Head is midline XII: Tongue deviates slightly to the right Motor: RUE 1/5 RLE 2/5 LUE and LLE 5/5 Sensory: Decreased temp and FT sensation on the right. No extinction with DSS.  Deep Tendon Reflexes:  2+ bilateral brachioradialis 1+ bilateral patellae 0 achilles bilaterally  Plantars: Equivocal bilaterally Cerebellar: Ataxic FNF and H-S on the left. Unable to perform on the right.  Gait: Unable to assess  Results for orders placed or performed during the hospital encounter of 06/10/20 (from the past 48 hour(s))  CBG monitoring, ED     Status: Abnormal   Collection Time: 06/10/20  6:05 AM  Result Value Ref Range   Glucose-Capillary 117 (H) 70 - 99 mg/dL    Comment: Glucose reference range applies only to samples taken after fasting for at least 8 hours.   CTA of head and neck:  1. Poorly enhancing dolichoectatic Basilar Artery, although the vessel does appear to remain patent. 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 2. Subsequently, there is confluent abnormal posterior circulation T-max >6s on CTP, with sparing the Left PCA territory on the basis of a fetal PCA origin on that side. No infarct core detected on CTP. 3. No CTA spot sign detected in the acute ventral pontine hemorrhage. 4. Bilateral calcified carotid artery atherosclerosis, 65-70% stenosis at the Left ICA bulb. No hemodynamically significant stenosis on the right. Dolichoectatic ICA siphons.  Assessment: 79 y.o. male with atrial fibrillation on Eliquis, presenting with an acute anterior midline pontine hemorrhage 1. Exam findings include severe dysarthria, right facial droop and right hemiplegia.  2.  CT head: Anterior midline acute pontine hemorrhage.  3. CTA: No aneurysm. Severely delayed basilar artery perfusion on the basis of hemodynamically significant bilateral vertebral artery stenoses (bilateral distal V4 and also dominant Left vertebral origin). 4. CTP: There is confluent abnormal posterior circulation T-max >6s on CTP, with sparing the Left PCA territory on the basis of a fetal PCA origin on that side. No infarct core detected on CTP.  Plan: 1. Admit to ICU under Neurology service 2. MRI of brain 3. PT consult, OT consult, Speech consult 4. Cardiac telemetry 5. Frequent neuro checks 6. Hypertonic saline 3% at 50 cc/hr 7. BP management with clevidipine drip. Although there is hypoperfusion of posterior circulation on CTP, the pontine hemorrhage is felt to carry with it a higher risk for morbidity/mortality should it extend, relative to the possibility of stroke from the decreased perfusion of the posterior circulation territory, which most likely has been chronically present  8. Kcentra being administered. Time elapsed of > 18 hours since last dose of Eliquis, therefore Theodoro Parma is favored over Andexxa. Appreciate Pharmacy assistance.  9. No antiplatelet medications or anticoagulants. DVT prophylaxis with SCDs  60 minutes spent in the emergent neurological evaluation and management of this critically ill patient  @Electronically  signed: Dr. 06/10/2020, 6:14 AM

## 2020-06-10 NOTE — ED Notes (Signed)
Potassium infusion and NS infusion will be started after MRI as channels are limited to 2 in MRI

## 2020-06-10 NOTE — Evaluation (Signed)
Speech Language Pathology Evaluation Patient Details Name: [name] MRN: [id number] DOB: April 11, 1941 Today's Date: 06/10/2020 Time: 1030-1050 SLP Time Calculation (min) (ACUTE ONLY): 20 min  Problem List:  Patient Active Problem List   Diagnosis Date Noted  . ICH (intracerebral hemorrhage) (HCC) 06/10/2020  . AAA (abdominal aortic aneurysm) without rupture (HCC) 11/27/2019  . Atrial fibrillation (HCC) 08/26/2019  . Spinal stenosis of lumbar region 11/02/2015  . Abnormal nuclear stress test   . Hyperlipidemia 12/31/2014  . HYPERTENSION, BENIGN SYSTEMIC 08/22/2006  . Coronary atherosclerosis 08/22/2006  . Thoracic aortic aneurysm (HCC) 08/22/2006   Past Medical History:  Past Medical History:  Diagnosis Date  . Abdominal aortic aneurysm [name])    status post endoluminal stent graft at [facility] in 2006  . Abdominal aortic aneurysm (HCC) 2006  . Arthritis   . Coronary artery disease   . GERD (gastroesophageal reflux disease)   . History of shingles    TIMES 2  . Hyperlipidemia   . Hypertension   . MRSA (methicillin resistant Staphylococcus aureus) infection   . Numbness    right leg from knee down worse when standing   . PONV (postoperative nausea and vomiting)   . Sleep apnea    pt scored 5 per Stop Bang tool at PAT visit 10/26/2015; results sent to [facility] NP  . Vertigo    Past Surgical History:  Past Surgical History:  Procedure Laterality Date  . ABDOMINAL AORTIC ANEURYSM REPAIR W/ ENDOLUMINAL GRAFT     13 years ago   . ANGIOPLASTY    . branch stenting  06/08/2004   Dr. Lavonne Chick  . CARDIAC CATHETERIZATION  06/08/2004  . CARDIOVASCULAR STRESS TEST  02/14/2009  . I&D of left knee     . LUMBAR LAMINECTOMY/DECOMPRESSION MICRODISCECTOMY N/A 11/02/2015   Procedure: MICROLUMBAR DECOMPRESSION L3-L4, L4-L5, AND L5-S1;  Surgeon: Jene Every, MD;  Location: [facility];  Service: Orthopedics;  Laterality: N/A;  . PICC LINE PLACE PERIPHERAL ([facility] HX)    .  TRANSTHORACIC ECHOCARDIOGRAM  06/07/2004   HPI:  79 y.o. male with atrial fibrillation on Eliquis, presenting with an acute anterior midline pontine hemorrhage  Neuro report includes  severe dysarthria, right facial droop and right hemiplegia.   Assessment / Plan / Recommendation Clinical Impression  Pt demonstrates moderate to severe dysarthria given right CN VII, XII and likely X weakness. Though cognition and language  appear otherwise intact, pt is labile, easily breaks into tears with RN and SLP. Pt is aware of dysarthria and intuitively overarticulating, slowing rate, chunking words and increasing volume to become 80% intelligible at phrase level. Recommend ongoing SLP interventions to target deficits. Recommend CIR at d/c.    SLP Assessment  SLP Recommendation/Assessment: Patient needs continued Speech Lanaguage Pathology Services SLP Visit Diagnosis: Dysarthria and anarthria (R47.1)    Follow Up Recommendations  Inpatient Rehab    Frequency and Duration min 2x/week  2 weeks      SLP Evaluation Cognition  Overall Cognitive Status: Impaired/Different from baseline Arousal/Alertness: Awake/alert Orientation Level: Oriented X4 Attention: Sustained Sustained Attention: Appears intact Memory: Appears intact Awareness: Appears intact Behaviors: Lability       Comprehension  Auditory Comprehension Overall Auditory Comprehension: Appears within functional limits for tasks assessed    Expression Verbal Expression Overall Verbal Expression: Appears within functional limits for tasks assessed   Oral / Motor  Oral Motor/Sensory Function Overall Oral Motor/Sensory Function: Severe impairment Facial ROM: Reduced right;Suspected CN VII (facial) dysfunction Facial Symmetry: Abnormal symmetry right;Suspected  CN VII (facial) dysfunction Facial Strength: Reduced right;Suspected CN VII (facial) dysfunction Lingual ROM: Reduced right;Suspected CN XII (hypoglossal) dysfunction Lingual  Symmetry: Abnormal symmetry right;Suspected CN XII (hypoglossal) dysfunction Lingual Strength: Reduced;Suspected CN XII (hypoglossal) dysfunction Velum: Suspected CN X (Vagus) dysfunction Mandible: Within Functional Limits Motor Speech Overall Motor Speech: Impaired Respiration: Within functional limits Phonation: Normal Resonance: Hypernasality Articulation: Impaired Level of Impairment: Word Intelligibility: Intelligibility reduced Word: 50-74% accurate Phrase: 50-74% accurate Sentence: 50-74% accurate Conversation: 50-74% accurate Motor Planning: Witnin functional limits Motor Speech Errors: Consistent;Aware   GO                   [name], MA CCC-SLP  Acute Rehabilitation Services Pager [phone] Office [phone]  [name] 06/10/2020, 12:49 PM

## 2020-06-10 NOTE — ED Notes (Signed)
Pt taken to MRI  

## 2020-06-10 NOTE — ED Notes (Addendum)
Pt is resting, wife at the bedside

## 2020-06-10 NOTE — Code Documentation (Signed)
Responded to Code Stroke called at 0554 for R sided weakness, facial droop, and slurred speech, [id number]. Per wife, pt went to bed at 2300. He woke up at 0500 and fell in floor. Per wife, he did not hit head or lose consciousness. Pt arrived to ED at 0603, CBG-117, NIH-15, CT head-pontine bleed. Pt taken back to ED room, started on cleviprex gtt, and given Kcentra. Plan to admit to ICU

## 2020-06-10 NOTE — ED Notes (Signed)
Pt will be NPO as he began coughing forcefully after the first sip of water.  Explained to pt and wife that pt will be NPO until his SLP swallow eval.

## 2020-06-10 NOTE — Evaluation (Signed)
Clinical/Bedside Swallow Evaluation Patient Details  Name: [name] MRN: [id number] Date of Birth: 1940-07-30  Today's Date: 06/10/2020 Time: SLP Start Time (ACUTE ONLY): 1030 SLP Stop Time (ACUTE ONLY): 1050 SLP Time Calculation (min) (ACUTE ONLY): 20 min  Past Medical History:  Past Medical History:  Diagnosis Date  . Abdominal aortic aneurysm [facility])    status post endoluminal stent graft at [facility] in 2006  . Abdominal aortic aneurysm (HCC) 2006  . Arthritis   . Coronary artery disease   . GERD (gastroesophageal reflux disease)   . History of shingles    TIMES 2  . Hyperlipidemia   . Hypertension   . MRSA (methicillin resistant Staphylococcus aureus) infection   . Numbness    right leg from knee down worse when standing   . PONV (postoperative nausea and vomiting)   . Sleep apnea    pt scored 5 per Stop Bang tool at PAT visit 10/26/2015; results sent to [facility] NP  . Vertigo    Past Surgical History:  Past Surgical History:  Procedure Laterality Date  . ABDOMINAL AORTIC ANEURYSM REPAIR W/ ENDOLUMINAL GRAFT     13 years ago   . ANGIOPLASTY    . branch stenting  06/08/2004   Dr. Lavonne Chick  . CARDIAC CATHETERIZATION  06/08/2004  . CARDIOVASCULAR STRESS TEST  02/14/2009  . I&D of left knee     . LUMBAR LAMINECTOMY/DECOMPRESSION MICRODISCECTOMY N/A 11/02/2015   Procedure: MICROLUMBAR DECOMPRESSION L3-L4, L4-L5, AND L5-S1;  Surgeon: Jene Every, MD;  Location: [facility];  Service: Orthopedics;  Laterality: N/A;  . PICC LINE PLACE PERIPHERAL ([facility] HX)    . TRANSTHORACIC ECHOCARDIOGRAM  06/07/2004   HPI:  79 y.o. male with atrial fibrillation on Eliquis, presenting with an acute anterior midline pontine hemorrhage  Neuro report includes  severe dysarthria, right facial droop and right hemiplegia.   Assessment / Plan / Recommendation Clinical Impression  Pt seen in the ED, demonstrates at least a moderate oral dysaphgia given right lingual and labial weakness.  Pt has significant difficulty maintaining labial seal during oral manipulation. Suspect velar/CN X weak ness given snoring sounds during efforts concern ing for flaccid nasopharyngeal musculature. He is able to masticate ice and initiate a swallow with a delayed cough. Teaspoons of puree and honey thick liquids were also attempted without immediate coughing. Pt needs an MBS prior to initiating diet, but if needed oral med can be given crushed in puree. Will plan for MBS this weekend. Prognosis for tolerance of modified diet good.  SLP Visit Diagnosis: Dysphagia, oropharyngeal phase (R13.12)    Aspiration Risk  Moderate aspiration risk    Diet Recommendation NPO except meds   Medication Administration: Crushed with puree    Other  Recommendations Oral Care Recommendations: Oral care QID Other Recommendations: Have oral suction available   Follow up Recommendations Inpatient Rehab      Frequency and Duration min 2x/week  2 weeks       Prognosis        Swallow Study   General HPI: 79 y.o. male with atrial fibrillation on Eliquis, presenting with an acute anterior midline pontine hemorrhage  Neuro report includes  severe dysarthria, right facial droop and right hemiplegia. Type of Study: Bedside Swallow Evaluation Previous Swallow Assessment: none Diet Prior to this Study: NPO Temperature Spikes Noted: No Respiratory Status: Room air History of Recent Intubation: No Behavior/Cognition: Alert;Cooperative Oral Cavity Assessment: Within Functional Limits Oral Care Completed by SLP: No Oral Cavity -  Dentition: Missing dentition;Poor condition Vision: Impaired for self-feeding Self-Feeding Abilities: Needs assist Patient Positioning: Postural control interferes with function;Partially reclined Baseline Vocal Quality: Normal Volitional Cough: Other (Comment) (unabel to elicit) Volitional Swallow: Unable to elicit    Oral/Motor/Sensory Function Overall Oral Motor/Sensory Function:  Severe impairment Facial ROM: Reduced right;Suspected CN VII (facial) dysfunction Facial Symmetry: Abnormal symmetry right;Suspected CN VII (facial) dysfunction Facial Strength: Reduced right;Suspected CN VII (facial) dysfunction Lingual ROM: Reduced right;Suspected CN XII (hypoglossal) dysfunction Lingual Symmetry: Abnormal symmetry right;Suspected CN XII (hypoglossal) dysfunction Lingual Strength: Reduced;Suspected CN XII (hypoglossal) dysfunction Velum: Suspected CN X (Vagus) dysfunction Mandible: Within Functional Limits   Ice Chips Ice chips: Impaired Presentation: Spoon Oral Phase Impairments: Reduced lingual movement/coordination;Reduced labial seal Oral Phase Functional Implications: Prolonged oral transit;Oral residue Pharyngeal Phase Impairments: Cough - Immediate   Thin Liquid Thin Liquid: Not tested    Nectar Thick Nectar Thick Liquid: Not tested   Honey Thick Honey Thick Liquid: Impaired Presentation: Spoon Oral Phase Impairments: Reduced labial seal Oral Phase Functional Implications: Prolonged oral transit   Puree Puree: Impaired Presentation: Spoon Oral Phase Impairments: Reduced labial seal   Solid     Solid: Not tested     [name], MA CCC-SLP  Acute Rehabilitation Services Pager [phone] Office [phone]  Dyanne Iha, Riley Nearing 06/10/2020,12:29 PM

## 2020-06-10 NOTE — Progress Notes (Signed)
STROKE TEAM PROGRESS NOTE   INTERVAL HISTORY His wife is at the bedside.  Patient lying in bed, awake alert, however severe dysarthria, right-sided weakness, left hand ataxia, left gaze nystagmus.  CT and MRI showed inferior pontine hemorrhage, stable size.  Currently on Cleviprex for BP control.  Did not pass swallow, n.p.o.  OBJECTIVE Vitals:   06/10/20 0815 06/10/20 0830 06/10/20 0900 06/10/20 0915  BP: (!) 154/81 (!) 148/90 (!) 141/106 (!) 143/88  Pulse: (!) 111 99 94 94  Resp: 18 (!) 21 17 (!) 21  SpO2: 95% 92% 93% 94%  Weight:        CBC:  Recent Labs  Lab 06/10/20 0606 06/10/20 0647  WBC 6.0  --   NEUTROABS 4.0  --   HGB 15.9 16.3  HCT 47.0 48.0  MCV 89.9  --   PLT 120*  --     Basic Metabolic Panel:  Recent Labs  Lab 06/10/20 0606 06/10/20 0647  NA 139 139  K 3.4* 3.3*  CL 102 103  CO2 22  --   GLUCOSE 128* 123*  BUN 20 23  CREATININE 1.22 1.10  CALCIUM 8.8*  --     Lipid Panel:     Component Value Date/Time   CHOL 129 05/27/2020 0943   TRIG 64 05/27/2020 0943   HDL 32 (L) 05/27/2020 0943   CHOLHDL 4.0 05/27/2020 0943   CHOLHDL 5.8 11/20/2007 0425   VLDL 10 11/20/2007 0425   LDLCALC 84 05/27/2020 0943   HgbA1c: No results found for: HGBA1C Urine Drug Screen: No results found for: LABOPIA, COCAINSCRNUR, LABBENZ, AMPHETMU, THCU, LABBARB  Alcohol Level     Component Value Date/Time   ETH <10 06/10/2020 0606    IMAGING  CT Code Stroke CTA Head W/WO contrast CT Code Stroke CTA Neck W/WO contrast CT Code Stroke Cerebral Perfusion with contrast 06/10/2020 IMPRESSION:  1. Poorly enhancing dolichoectatic Basilar Artery, although the vessel does appear to remain patent. 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   2. Subsequently, there is confluent abnormal posterior circulation T-max >6s on  CTP, with sparing the Left PCA territory on the basis of a fetal PCA origin on that side. No infarct core detected on CTP.  3. No CTA spot sign detected in the acute ventral pontine hemorrhage.  4. Bilateral calcified carotid artery atherosclerosis, 65-70% stenosis at the Left ICA bulb. No hemodynamically significant stenosis on the right. Dolichoectatic ICA siphons.  5. Aortic Atherosclerosis (ICD10-I70.0).   CT HEAD CODE STROKE WO CONTRAST 06/10/2020 IMPRESSION:  1. 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 No mass effect, extra-axial- or intraventricular extension of blood at this time.  2. 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   CT head WO Contrast - pending  MRI head WO Contrast - pending  Chest Port 1 View 06/10/2020 IMPRESSION:  1. No acute cardiopulmonary abnormality.  2. Chronic thoracic Aortic Endograft.   Transthoracic Echocardiogram  00/00/2021 Pending  ECG - atrial fibrillation - ventricular response 91BPM (See cardiology reading for complete details)   PHYSICAL EXAM  Temp:  [98.3 F (36.8 C)] 98.3 F (36.8 C) (12/17 1633) Pulse Rate:  [80-111] 111 (12/17 1633) Resp:  [14-33] 27 (12/17 1633) BP: (111-173)/(75-131) 141/95 (12/17 1630) SpO2:  [91 %-97 %] 95 % (12/17 1633) Weight:  [99 kg] 99 kg (12/17 0639)  General - Well nourished, well developed, in no apparent distress, mildly lethargic.  Ophthalmologic - fundi not visualized due to noncooperation.  Cardiovascular - irregularly irregular heart rate and rhythm.  Neuro - awake, alert, orientated x3.  However severe dysarthria, no aphasia, able to name and repeat, follow simple commands.  Visual field full, bilateral gaze completed, however with left gaze, sustained nystagmus.  PERRL.  Right facial droop, tongue protrusion to the right.  Right upper extremity flaccid, right lower extremity 2 -/5 proximally,  0/5 distally.  Left upper lower extremity 5/5 left lower extremity at least 4/5.  Sensation symmetrical.  Left finger-to-nose ataxic.    ASSESSMENT/PLAN [name] is a 79 y.o. male with history of  atrial fibrillation (on Eliquis - but missed last evening dose), AAA s/p stent graft in 2006, CAD, shingles x 2, HLD, HTN, sleep apnea and vertigo presenting with severe Rt sided weakness, facial droop and severe dysarthria. He did not receive IV t-PA due to ICH.  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 No mass effect, extra-axial- or intraventricular extension of blood at this time. 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 Hemodynamically significant bilateral vertebral artery stenoses (bilateral distal V4 and also dominant Left vertebral origin). Bilateral calcified carotid artery atherosclerosis, 65-70% stenosis at the Left ICA bulb.  MRI head ventral pontine acute hemorrhage 14 mm.  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  CT head repeat- pending  2D Echo - pending  Sars Corona Virus 2 - negative  LDL - 84  HgbA1c - pending  UDS - neg  VTE prophylaxis - SCDs  Eliquis (apixaban) daily prior to admission, now on No antithrombotic  Ongoing aggressive stroke risk factor management  Therapy recommendations:  pending  Disposition:  Pending  Chronic afib  On eliquis PTA  Received Kcentra reversal   Now on no antithrombotics  Given MRI showing numerous chronic microhemorrhages bilaterally, concerning for hypertensive versus cerebral amyloid, likely not to be future anticoagulation candidate  Hypertension  Home BP  meds: metoprolol ; amlodipine  Current BP meds: Cleviprex IV . SBP goal < 140 mm Hg . Add p.o. BP meds once p.o. access . Long-term BP goal normotensive  Hyperlipidemia  Home Lipid lowering medication: none   LDL 84, goal < 70  Hold off statin given current ICH  Consider statin at discharge  Dysphagia  Did not pass swallow  NPO  IV fluid  Speech to follow  Other Stroke Risk Factors  Advanced age  Overweight, Body mass index is 28.02 kg/m., recommend weight loss, diet and exercise as appropriate   Coronary artery disease  Obstructive sleep apnea  AAA stat post stenting in 2006  Other Active Problems, Findings and Recommendations  Code status - Full Code  Aortic Atherosclerosis (ICD10-I70.0)   Hypokalemia - potassium - 3.4->3.3 - supplement  Mild thrombocytopenia - platelets - 120  Hospital day # 0  This patient is critically ill due to pulmonary hemorrhage, A. fib on Eliquis status post reversal, hypertensive emergency, dysphagia and at significant risk of neurological worsening, death form hematoma expansion, brain herniation, brain death, heart failure, aspiration pneumonia. This patient's care requires constant monitoring of vital signs, hemodynamics, respiratory and cardiac monitoring, review of  multiple databases, neurological assessment, discussion with family, other specialists and medical decision making of high complexity. I spent 30 minutes of neurocritical care time in the care of this patient. I had long discussion with wife at bedside, updated pt current condition, treatment plan and potential prognosis, and answered all the questions.  She expressed understanding and appreciation.   Marvel Plan, MD PhD Stroke Neurology 06/10/2020 5:46 PM  To contact Stroke Continuity provider, please refer to [email or web address]. After hours, contact General Neurology

## 2020-06-10 NOTE — ED Notes (Signed)
Bedside report received from [employer].  Stroke RN and wife at bedside.  Pt is alert and oriented but his speech is slurred and drawn out.  Pt has significant right sided weakness.  There is a urine odor and pt underwear are wet.  Pt undressed and cleaned with peri cleanser and warm washcloths.  Offered pt the urinal but pt was unable to void.  Placed pt in depend after discussing with pt and wife and changed the linens and placed pt in hospital gown.  Pt is resting, RN at bedside and will continue to monitor.

## 2020-06-10 NOTE — ED Notes (Signed)
Speech

## 2020-06-10 NOTE — ED Notes (Signed)
Pt was incontinent of urine and I changed the depend and chuck for pt.

## 2020-06-10 NOTE — ED Provider Notes (Signed)
[facility] EMERGENCY DEPARTMENT Provider Note   CSN: [id number] Arrival date & time: 06/10/20  0865  An emergency department physician performed an initial assessment on this suspected stroke patient at 0606.  History Chief Complaint  Patient presents with  . Code Stroke    [name] is a 79 y.o. male with a h/o of A. Fib on Eliquis, AAA s/p stent graft in 2006, CAD s/p stent to the LAD and diagonal branch in 2005 and restenting at [facility] subsequently, GERD, HTN, HLD, vertigo, and OSA who presents to the Emergency Department by EMS as a CODE STROKE for sudden onset of right-sided weakness.  LKN 23:00.  At 5 AM, the patient attempted to get out of bed and fell.  His wife notes that he fell onto his right side.  He was having difficulty speaking and was acting abnormally so his wife called EMS.  In route, he had severe weakness of his right arm and right leg as well as facial droop and dysarthria.  Update: Patient's wife is now at bedside.  She reports that he initially was very agitated and was endorsing chest pain after his fall, but this has since resolved.  She reports that he has been feeling poorly over the last week and has had vomiting for several days.  She also notes that he has been intermittently dizzy, worse with standing.  She also reports that he has had hemoptysis, but has not been coughing up clots.  She notes that this has been ongoing for many months, but has not been worsening.  No abdominal pain, shortness of breath, neck pain, rash, or numbness.  The patient lives at home with his wife. He has a history of intolerance to statins.  He is a never smoker.  The history is provided by medical records. No language interpreter was used.       Past Medical History:  Diagnosis Date  . Abdominal aortic aneurysm [facility])    status post endoluminal stent graft at [facility] in 2006  . Abdominal aortic aneurysm (HCC) 2006  . Arthritis   . Coronary  artery disease   . GERD (gastroesophageal reflux disease)   . History of shingles    TIMES 2  . Hyperlipidemia   . Hypertension   . MRSA (methicillin resistant Staphylococcus aureus) infection   . Numbness    right leg from knee down worse when standing   . PONV (postoperative nausea and vomiting)   . Sleep apnea    pt scored 5 per Stop Bang tool at PAT visit 10/26/2015; results sent to [facility] NP  . Vertigo     Patient Active Problem List   Diagnosis Date Noted  . ICH (intracerebral hemorrhage) (HCC) 06/10/2020  . AAA (abdominal aortic aneurysm) without rupture (HCC) 11/27/2019  . Atrial fibrillation (HCC) 08/26/2019  . Spinal stenosis of lumbar region 11/02/2015  . Abnormal nuclear stress test   . Hyperlipidemia 12/31/2014  . HYPERTENSION, BENIGN SYSTEMIC 08/22/2006  . Coronary atherosclerosis 08/22/2006  . Thoracic aortic aneurysm (HCC) 08/22/2006    Past Surgical History:  Procedure Laterality Date  . ABDOMINAL AORTIC ANEURYSM REPAIR W/ ENDOLUMINAL GRAFT     13 years ago   . ANGIOPLASTY    . branch stenting  06/08/2004   Dr. Lavonne Chick  . CARDIAC CATHETERIZATION  06/08/2004  . CARDIOVASCULAR STRESS TEST  02/14/2009  . I&D of left knee     . LUMBAR LAMINECTOMY/DECOMPRESSION MICRODISCECTOMY N/A 11/02/2015   Procedure:  MICROLUMBAR DECOMPRESSION L3-L4, L4-L5, AND L5-S1;  Surgeon: Jene Every, MD;  Location: [facility] ORS;  Service: Orthopedics;  Laterality: N/A;  . PICC LINE PLACE PERIPHERAL ([facility] HX)    . TRANSTHORACIC ECHOCARDIOGRAM  06/07/2004       Family History  Problem Relation Age of Onset  . Alzheimer's disease Sister   . Heart failure Maternal Grandmother   . Heart failure Maternal Grandfather   . Heart failure Paternal Grandmother   . Heart failure Paternal Grandfather   . Cancer Sister     Social History   Tobacco Use  . Smoking status: Never Smoker  . Smokeless tobacco: Never Used  Substance Use Topics  . Alcohol use: No  . Drug use: No     Home Medications Prior to Admission medications   Medication Sig Start Date End Date Taking? Authorizing Provider  amLODipine (NORVASC) 10 MG tablet TAKE 1 TABLET(10 MG) BY MOUTH DAILY Patient taking differently: Take 10 mg by mouth daily. 09/23/19  Yes Runell Gess, MD  metoprolol tartrate (LOPRESSOR) 25 MG tablet TAKE 1 TABLET BY MOUTH DAILY Patient taking differently: Take 25 mg by mouth daily. 09/11/19  Yes Runell Gess, MD  pantoprazole (PROTONIX) 40 MG tablet TAKE 1 TABLET(40 MG) BY MOUTH DAILY Patient taking differently: Take 40 mg by mouth daily. TAKE 1 TABLET(40 MG) BY MOUTH DAILY 08/26/19  Yes Runell Gess, MD    Allergies    Crestor [rosuvastatin] and Lipitor [atorvastatin]  Review of Systems   Review of Systems  Constitutional: Negative for appetite change, chills, diaphoresis and fever.       Malaise  HENT: Negative for congestion and sore throat.   Respiratory: Negative for shortness of breath.   Cardiovascular: Positive for chest pain.  Gastrointestinal: Positive for vomiting. Negative for abdominal pain.  Genitourinary: Negative for dysuria.  Musculoskeletal: Negative for back pain, myalgias, neck pain and neck stiffness.  Skin: Negative for rash.  Allergic/Immunologic: Negative for immunocompromised state.  Neurological: Positive for weakness. Negative for syncope, numbness and headaches.       Dysarthria  Psychiatric/Behavioral: Negative for confusion.    Physical Exam Updated Vital Signs BP (!) 111/91   Pulse 91   Resp 20   Wt 99 kg   SpO2 95%   BMI 28.02 kg/m   Physical Exam Vitals and nursing note reviewed.  Constitutional:      Appearance: He is well-developed. He is not toxic-appearing.  HENT:     Head: Normocephalic.  Eyes:     Conjunctiva/sclera: Conjunctivae normal.  Cardiovascular:     Rate and Rhythm: Regular rhythm. Tachycardia present.     Pulses: Normal pulses.     Heart sounds: Normal heart sounds. No murmur  heard. No friction rub. No gallop.   Pulmonary:     Effort: Pulmonary effort is normal. No respiratory distress.     Breath sounds: No stridor. No wheezing, rhonchi or rales.     Comments: Lungs are clear to auscultation bilaterally Chest:     Chest wall: No tenderness.  Abdominal:     General: There is no distension.     Palpations: Abdomen is soft. There is no mass.     Tenderness: There is no abdominal tenderness. There is no right CVA tenderness, left CVA tenderness, guarding or rebound.     Hernia: No hernia is present.     Comments: Abdomen soft, nontender, nondistended.  Musculoskeletal:     Cervical back: Neck supple.     Right lower  leg: No edema.     Left lower leg: No edema.  Skin:    General: Skin is warm and dry.     Coloration: Skin is pale. Skin is not jaundiced.  Neurological:     Mental Status: He is alert.     Comments: There is drift of the right arm and leg.  Significant weakness to the right arm and leg.  5-5 strength of the left arm and leg.  There is neglect on the right.  He is dysarthric.  Sensation is decreased to light touch and temperature on the right.  He has ataxia with heel-to-shin on the left.  There is ataxia with finger-to-nose on the left. Unable to perform heel to shin or FTN on the right.  Right-sided ptosis.  He is alert and oriented x4.  He is able to follow simple commands.  Answers questions appropriately.  Psychiatric:        Behavior: Behavior normal.     ED Results / Procedures / Treatments   Labs (all labs ordered are listed, but only abnormal results are displayed) Labs Reviewed  CBC - Abnormal; Notable for the following components:      Result Value   Platelets 120 (*)    All other components within normal limits  COMPREHENSIVE METABOLIC PANEL - Abnormal; Notable for the following components:   Potassium 3.4 (*)    Glucose, Bld 128 (*)    Calcium 8.8 (*)    Albumin 3.0 (*)    Total Bilirubin 1.5 (*)    All other components  within normal limits  I-STAT CHEM 8, ED - Abnormal; Notable for the following components:   Potassium 3.3 (*)    Glucose, Bld 123 (*)    Calcium, Ion 1.13 (*)    All other components within normal limits  CBG MONITORING, ED - Abnormal; Notable for the following components:   Glucose-Capillary 117 (*)    All other components within normal limits  RESP PANEL BY RT-PCR (FLU A&B, COVID) ARPGX2  ETHANOL  PROTIME-INR  APTT  DIFFERENTIAL  RAPID URINE DRUG SCREEN, HOSP PERFORMED  URINALYSIS, COMPLETE (UACMP) WITH MICROSCOPIC  SODIUM  SODIUM  SODIUM  TROPONIN I (HIGH SENSITIVITY)  TROPONIN I (HIGH SENSITIVITY)    EKG None  Radiology CT Code Stroke CTA Head W/WO contrast  Result Date: 06/10/2020 CLINICAL DATA:  79 year old male code stroke presentation with right side weakness and slurred speech. 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 EXAM: CT ANGIOGRAPHY HEAD AND NECK CT PERFUSION BRAIN TECHNIQUE: Multidetector CT imaging of the head and neck was performed using the standard protocol during bolus administration of intravenous contrast. Multiplanar CT image reconstructions and MIPs were obtained to evaluate the vascular anatomy. Carotid stenosis measurements (when applicable) are obtained utilizing NASCET criteria, using the distal internal carotid diameter as the denominator. Multiphase CT imaging of the brain was performed following IV bolus contrast injection. Subsequent parametric perfusion maps were calculated using RAPID software. CONTRAST:  80mL OMNIPAQUE IOHEXOL 350 MG/ML SOLN COMPARISON:  Plain head CT 0614 hours today. FINDINGS: CT Brain Perfusion Findings: CBF (<30%) Volume: None Perfusion (Tmax>6.0s) volume: , predominantly in the cerebellar hemispheres and right PCA territory, see below. Mismatch Volume: Infarction Location:No core infarct detected. 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  Carious dentition. Ankylosis continues into the upper thoracic spine. No acute osseous abnormality identified. Upper chest: Negative. Other neck: No acute finding. Aortic arch: Tortuous aortic arch with calcified  atherosclerosis. Three vessel arch configuration. Right carotid system: Brachiocephalic tortuosity and calcified plaque without stenosis. Tortuous proximal right CCA without stenosis. Calcified distal right CCA and right ICA origin and bulb plaque. Stenosis is up to 50 % with respect to the distal vessel at the distal right ICA bulb. Left carotid system: Calcified plaque at the left CCA origin without stenosis. Tortuous proximal left CCA. Mild calcified plaque at the distal left CCA and left ICA origin. Moderate calcified plaque at the distal ICA bulb with stenosis up to 65-70 % with respect to the distal vessel (series 5, image 109). The left ICA remains patent to the skull base. Vertebral arteries: Tortuous proximal right subclavian artery with abundant calcified plaque. The right vertebral artery origin remains patent without stenosis. Non dominant right vertebral artery is patent through the V2 segment with only mild plaque. Decreasing enhancement of the right vertebral artery V3 segment which is calcified to the skull base, although without high-grade stenosis. See intracranial findings below. Tortuous and calcified proximal left subclavian artery without significant stenosis. However, calcified plaque at the left vertebral artery origin results in high-grade stenosis (series 8, image 181). The left vertebral artery is dominant. Tortuous left V1 segment. Additional calcified left V2 segment plaque although without high-grade stenosis. Decreasing distal left vertebral artery enhancement compared to the carotids. V3 calcified plaque, although no additional high-grade stenosis to the skull base. CTA HEAD Posterior circulation: Non dominant distal right vertebral artery with calcified but patent early  origin of the right PICA. Distal to PICA V4 calcified plaque results in severe stenoses (series 6, image 319, 315) but the right vertebral remains patent to the vertebrobasilar junction. Dominant left V4 segment is heavily calcified and also demonstrates moderate to severe stenosis on series 6, image 310. But the vessel remains patent to the vertebrobasilar junction. Dolichoectatic and calcified basilar artery with considerably decreased enhancement compared to the ICA siphons, although the vessel does appear to remain patent (mid basilar lumen 111 Hounsfield units versus noncontrast CT 55 Hounsfield units earlier today). The basilar tip appears patent as well. PCA origins appear patent. SCA and AICA vessels not well visualized. There is a fetal type left PCA. Left PCA branches appear patent with mild to moderate irregularity. Asymmetrically diminished right PCA enhancement although no definite right PCA P1 or P2 occlusion. No CTA spot sign is evident in the ventral pontine hemorrhage. Anterior circulation: Dolichoectatic and calcified ICA siphons are patent with considerably more enhancement than the basilar artery. No ICA siphon stenosis despite the atherosclerosis. Patent carotid termini, MCA and ACA origins. Tortuous A1 segments. Normal anterior communicating artery. Bilateral ACA branches are within normal limits. Left MCA M1 segment and bifurcation are patent without stenosis. Right MCA M1 segment and bifurcation are patent without stenosis. There is mild calcified bilateral M1 atherosclerosis. No MCA branch occlusion is identified. Venous sinuses: Early contrast timing, not evaluated. Anatomic variants: Dominant left vertebral artery. Fetal type left PCA origin. Review of the MIP images confirms the above findings IMPRESSION: 1. Poorly enhancing dolichoectatic Basilar Artery, although the vessel does appear to remain patent. Suspect this is severely delayed basilar artery perfusion on the basis of  hemodynamically significant bilateral vertebral artery stenoses (bilateral distal V4 and also dominant Left vertebral origin). This was communicated to Dr. Otelia Limes at [phone] hours by text page via the [facility] messaging system. 2. Subsequently, there is confluent abnormal posterior circulation T-max >6s on CTP, with sparing the Left PCA territory on the basis of a fetal PCA origin  on that side. No infarct core detected on CTP. 3. No CTA spot sign detected in the acute ventral pontine hemorrhage. 4. Bilateral calcified carotid artery atherosclerosis, 65-70% stenosis at the Left ICA bulb. No hemodynamically significant stenosis on the right. Dolichoectatic ICA siphons. 5.  Aortic Atherosclerosis (ICD10-I70.0). Electronically Signed   By: Odessa Fleming M.D.   On: 06/10/2020 07:27   CT Code Stroke CTA Neck W/WO contrast  Result Date: 06/10/2020 CLINICAL DATA:  79 year old male code stroke presentation with right side weakness and slurred speech. 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 EXAM: CT ANGIOGRAPHY HEAD AND NECK CT PERFUSION BRAIN TECHNIQUE: Multidetector CT imaging of the head and neck was performed using the standard protocol during bolus administration of intravenous contrast. Multiplanar CT image reconstructions and MIPs were obtained to evaluate the vascular anatomy. Carotid stenosis measurements (when applicable) are obtained utilizing NASCET criteria, using the distal internal carotid diameter as the denominator. Multiphase CT imaging of the brain was performed following IV bolus contrast injection. Subsequent parametric perfusion maps were calculated using RAPID software. CONTRAST:  95mL OMNIPAQUE IOHEXOL 350 MG/ML SOLN COMPARISON:  Plain head CT 0614 hours today. FINDINGS: CT Brain Perfusion Findings: CBF (<30%) Volume: None Perfusion (Tmax>6.0s) volume: , predominantly in the cerebellar hemispheres and right PCA territory, see below. Mismatch Volume: Infarction Location:No core infarct detected.  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 Carious dentition. Ankylosis continues into the upper thoracic spine. No acute osseous abnormality identified. Upper chest: Negative. Other neck: No acute finding. Aortic arch: Tortuous aortic arch with calcified atherosclerosis. Three vessel arch configuration. Right carotid system: Brachiocephalic tortuosity and calcified plaque without stenosis. Tortuous proximal right CCA without stenosis. Calcified distal right CCA and right ICA origin and bulb plaque. Stenosis is up to 50 % with respect to the distal vessel at the distal right ICA bulb. Left carotid system: Calcified plaque at the left CCA origin without stenosis. Tortuous proximal left CCA. Mild calcified plaque at the distal left CCA and left ICA origin. Moderate calcified plaque at the distal ICA bulb with stenosis up to 65-70 % with respect to the distal vessel (series 5, image 109). The left ICA remains patent to the skull base. Vertebral arteries: Tortuous proximal right subclavian artery with abundant calcified plaque. The right vertebral artery origin remains patent without stenosis. Non dominant right vertebral artery is patent through the V2 segment with only mild plaque. Decreasing enhancement of the right vertebral artery V3 segment which is calcified to the skull base, although without high-grade stenosis. See intracranial findings below. Tortuous and calcified proximal left subclavian artery without significant stenosis. However, calcified plaque at the left vertebral artery origin results in high-grade stenosis (series 8, image 181). The left vertebral artery is dominant. Tortuous left V1 segment. Additional calcified left V2 segment plaque although without high-grade stenosis. Decreasing distal left vertebral artery enhancement compared to the carotids. V3 calcified plaque, although no additional high-grade stenosis to the  skull base. CTA HEAD Posterior circulation: Non dominant distal right vertebral artery with calcified but patent early origin of the right PICA. Distal to PICA V4 calcified plaque results in severe stenoses (series 6, image 319, 315) but the right vertebral remains patent to the vertebrobasilar junction. Dominant left V4 segment is heavily calcified and also demonstrates moderate to severe stenosis on series 6, image 310. But the vessel remains patent to the vertebrobasilar junction. Dolichoectatic and calcified basilar artery with considerably decreased enhancement compared to the  ICA siphons, although the vessel does appear to remain patent (mid basilar lumen 111 Hounsfield units versus noncontrast CT 55 Hounsfield units earlier today). The basilar tip appears patent as well. PCA origins appear patent. SCA and AICA vessels not well visualized. There is a fetal type left PCA. Left PCA branches appear patent with mild to moderate irregularity. Asymmetrically diminished right PCA enhancement although no definite right PCA P1 or P2 occlusion. No CTA spot sign is evident in the ventral pontine hemorrhage. Anterior circulation: Dolichoectatic and calcified ICA siphons are patent with considerably more enhancement than the basilar artery. No ICA siphon stenosis despite the atherosclerosis. Patent carotid termini, MCA and ACA origins. Tortuous A1 segments. Normal anterior communicating artery. Bilateral ACA branches are within normal limits. Left MCA M1 segment and bifurcation are patent without stenosis. Right MCA M1 segment and bifurcation are patent without stenosis. There is mild calcified bilateral M1 atherosclerosis. No MCA branch occlusion is identified. Venous sinuses: Early contrast timing, not evaluated. Anatomic variants: Dominant left vertebral artery. Fetal type left PCA origin. Review of the MIP images confirms the above findings IMPRESSION: 1. Poorly enhancing dolichoectatic Basilar Artery, although the  vessel does appear to remain patent. 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 This was communicated to Dr. Otelia Limes at [phone] hours by text page via the [facility] messaging system. 2. Subsequently, there is confluent abnormal posterior circulation T-max >6s on CTP, with sparing the Left PCA territory on the basis of a fetal PCA origin on that side. No infarct core detected on CTP. 3. No CTA spot sign detected in the acute ventral pontine hemorrhage. 4. Bilateral calcified carotid artery atherosclerosis, 65-70% stenosis at the Left ICA bulb. No hemodynamically significant stenosis on the right. Dolichoectatic ICA siphons. 5.  Aortic Atherosclerosis (ICD10-I70.0). Electronically Signed   By: Odessa Fleming M.D.   On: 06/10/2020 07:27   CT Code Stroke Cerebral Perfusion with contrast  Result Date: 06/10/2020 CLINICAL DATA:  79 year old male code stroke presentation with right side weakness and slurred speech. 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 EXAM: CT ANGIOGRAPHY HEAD AND NECK CT PERFUSION BRAIN TECHNIQUE: Multidetector CT imaging of the head and neck was performed using the standard protocol during bolus administration of intravenous contrast. Multiplanar CT image reconstructions and MIPs were obtained to evaluate the vascular anatomy. Carotid stenosis measurements (when applicable) are obtained utilizing NASCET criteria, using the distal internal carotid diameter as the denominator. Multiphase CT imaging of the brain was performed following IV bolus contrast injection. Subsequent parametric perfusion maps were calculated using RAPID software. CONTRAST:  95mL OMNIPAQUE IOHEXOL 350 MG/ML SOLN COMPARISON:  Plain head CT 0614 hours today. FINDINGS: CT Brain Perfusion Findings: CBF (<30%) Volume: None Perfusion (Tmax>6.0s) volume: , predominantly in the  cerebellar hemispheres and right PCA territory, see below. Mismatch Volume: Infarction Location:No core infarct detected. 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 Carious dentition. Ankylosis continues into the upper thoracic spine. No acute osseous abnormality identified. Upper chest: Negative. Other neck: No acute finding. Aortic arch: Tortuous aortic arch with calcified atherosclerosis. Three vessel arch configuration. Right carotid system: Brachiocephalic tortuosity and calcified plaque without stenosis. Tortuous proximal right CCA without stenosis. Calcified distal right CCA and right ICA origin and bulb plaque. Stenosis is up to 50 % with respect to the distal vessel at the distal right ICA bulb.  Left carotid system: Calcified plaque at the left CCA origin without stenosis. Tortuous proximal left CCA. Mild calcified plaque at the distal left CCA and left ICA origin. Moderate calcified plaque at the distal ICA bulb with stenosis up to 65-70 % with respect to the distal vessel (series 5, image 109). The left ICA remains patent to the skull base. Vertebral arteries: Tortuous proximal right subclavian artery with abundant calcified plaque. The right vertebral artery origin remains patent without stenosis. Non dominant right vertebral artery is patent through the V2 segment with only mild plaque. Decreasing enhancement of the right vertebral artery V3 segment which is calcified to the skull base, although without high-grade stenosis. See intracranial findings below. Tortuous and calcified proximal left subclavian artery without significant stenosis. However, calcified plaque at the left vertebral artery origin results in high-grade stenosis (series 8, image 181). The left vertebral artery is dominant. Tortuous left V1 segment. Additional calcified left V2 segment plaque although without high-grade stenosis. Decreasing distal left  vertebral artery enhancement compared to the carotids. V3 calcified plaque, although no additional high-grade stenosis to the skull base. CTA HEAD Posterior circulation: Non dominant distal right vertebral artery with calcified but patent early origin of the right PICA. Distal to PICA V4 calcified plaque results in severe stenoses (series 6, image 319, 315) but the right vertebral remains patent to the vertebrobasilar junction. Dominant left V4 segment is heavily calcified and also demonstrates moderate to severe stenosis on series 6, image 310. But the vessel remains patent to the vertebrobasilar junction. Dolichoectatic and calcified basilar artery with considerably decreased enhancement compared to the ICA siphons, although the vessel does appear to remain patent (mid basilar lumen 111 Hounsfield units versus noncontrast CT 55 Hounsfield units earlier today). The basilar tip appears patent as well. PCA origins appear patent. SCA and AICA vessels not well visualized. There is a fetal type left PCA. Left PCA branches appear patent with mild to moderate irregularity. Asymmetrically diminished right PCA enhancement although no definite right PCA P1 or P2 occlusion. No CTA spot sign is evident in the ventral pontine hemorrhage. Anterior circulation: Dolichoectatic and calcified ICA siphons are patent with considerably more enhancement than the basilar artery. No ICA siphon stenosis despite the atherosclerosis. Patent carotid termini, MCA and ACA origins. Tortuous A1 segments. Normal anterior communicating artery. Bilateral ACA branches are within normal limits. Left MCA M1 segment and bifurcation are patent without stenosis. Right MCA M1 segment and bifurcation are patent without stenosis. There is mild calcified bilateral M1 atherosclerosis. No MCA branch occlusion is identified. Venous sinuses: Early contrast timing, not evaluated. Anatomic variants: Dominant left vertebral artery. Fetal type left PCA origin. Review  of the MIP images confirms the above findings IMPRESSION: 1. Poorly enhancing dolichoectatic Basilar Artery, although the vessel does appear to remain patent. 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 This was communicated to Dr. Otelia LimesLindzen at [phone] hours by text page [facility] the [facility]. 2. Subsequently, there is confluent abnormal posterior circulation T-max >6s on CTP, with sparing the Left PCA territory on the basis of a fetal PCA origin on that side. No infarct core detected on CTP. 3. No CTA spot sign detected in the acute ventral pontine hemorrhage. 4. Bilateral calcified carotid artery atherosclerosis, 65-70% stenosis at the Left ICA bulb. No hemodynamically significant stenosis on the right. Dolichoectatic ICA siphons. 5.  Aortic Atherosclerosis (ICD10-I70.0). Electronically  Signed   By: Odessa Fleming M.D.   On: 06/10/2020 07:27   Chest Port 1 View  Result Date: 06/10/2020 CLINICAL DATA:  79 year old male code stroke presentation, pontine hemorrhage and decreased posterior circulation perfusion. EXAM: PORTABLE CHEST 1 VIEW COMPARISON:  CT, CTA and CTP head today. Chest radiographs 06/28/2017 and earlier. FINDINGS: Portable AP semi upright view at 0730 hours. Mildly lower lung volumes. Stable cardiac size and mediastinal contours. Chronic thoracic aortic endograft. Allowing for portable technique the lungs are clear. No pneumothorax or pleural effusion. No acute osseous abnormality identified. IMPRESSION: 1.  No acute cardiopulmonary abnormality. 2. Chronic thoracic Aortic Endograft. Electronically Signed   By: Odessa Fleming M.D.   On: 06/10/2020 07:51   CT HEAD CODE STROKE WO CONTRAST  Result Date: 06/10/2020 CLINICAL DATA:  Code stroke. 79 year old male with right side weakness and slurred speech. EXAM: CT HEAD WITHOUT CONTRAST TECHNIQUE:  Contiguous axial images were obtained from the base of the skull through the vertex without intravenous contrast. COMPARISON:  Head CT 06/11/2014 [facility] FINDINGS: Brain: Generalized cerebral volume loss since 2015. Mild ex vacuo ventricular enlargement now. Oval up to 13 mm hyperdensity throughout the ventral lower pons best seen on sagittal image 31 of series 8 most resembles an acute intra-axial hemorrhage (series 4, image 11). No significant edema or regional mass effect at this time. Estimated blood volume is 1 mL. No extra-axial or intraventricular blood identified. No posterior fossa mass effect. Supratentorial Patchy and confluent bilateral cerebral white matter hypodensity and heterogeneity in the bilateral deep gray nuclei, including a chronic left thalamic lacune which was present in 2015 (series 4, image 18). No cortically based acute infarct identified. 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 Skull: No acute osseous abnormality identified. Sinuses/Orbits: Visualized paranasal sinuses and mastoids are stable and well pneumatized. Other: No acute orbit or scalp soft tissue finding. ASPECTS [facility] Stroke Program Early CT Score) Total score (0-10 with 10 being normal): 10, but note acute brainstem hemorrhage. IMPRESSION: 1. 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 No mass effect, extra-axial- or intraventricular extension of blood at this time. 2. 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 3. These results were communicated to Dr. Otelia Limes at 6:25 am on 06/10/2020 by text page via the [facility] messaging system. Electronically Signed   By: Odessa Fleming M.D.   On: 06/10/2020 06:26    Procedures .Critical Care Performed by: Barkley Boards, PA-C Authorized by: Barkley Boards, PA-C   Critical care provider statement:    Critical care time (minutes):  50    Critical care time was exclusive of:  Separately billable procedures and treating other patients and teaching time   Critical care was necessary to treat or prevent imminent or life-threatening deterioration of the following conditions:  CNS failure or compromise   Critical care was time spent personally by me on the following activities:  Ordering and performing treatments and interventions, ordering and review of laboratory studies, ordering and review of radiographic studies, pulse oximetry, development of treatment plan with patient or surrogate, discussions with consultants, evaluation of patient's response to treatment, obtaining history from patient or surrogate, re-evaluation of patient's condition, review of old charts and examination of patient   I assumed direction of critical care for this patient from another provider in my specialty: no     Care discussed with: admitting provider     (including critical care time)  Medications Ordered  in ED Medications   stroke: mapping our early stages of recovery book (has no administration in time range)  acetaminophen (TYLENOL) tablet 650 mg (has no administration in time range)    Or  acetaminophen (TYLENOL) 160 MG/5ML solution 650 mg (has no administration in time range)    Or  acetaminophen (TYLENOL) suppository 650 mg (has no administration in time range)  senna-docusate (Senokot-S) tablet 1 tablet (0 tablets Oral Hold 06/10/20 0944)  pantoprazole (PROTONIX) injection 40 mg (has no administration in time range)  clevidipine (CLEVIPREX) infusion 0.5 mg/mL (3 mg/hr Intravenous Rate/Dose Change 06/10/20 0917)  0.9 %  sodium chloride infusion (has no administration in time range)  sodium chloride (hypertonic) 3 % solution ( Intravenous New Bag/Given 06/10/20 0927)  metoprolol tartrate (LOPRESSOR) tablet 25 mg (0 mg Oral Hold 06/10/20 0943)  amLODipine (NORVASC) tablet 10 mg (0 mg Oral Hold 06/10/20 0944)  potassium chloride 10 mEq in 100 mL  IVPB (has no administration in time range)  prothrombin complex conc human (KCENTRA) IVPB 5,387 Units (0 Units Intravenous Stopped 06/10/20 0749)  iohexol (OMNIPAQUE) 350 MG/ML injection 95 mL (95 mLs Intravenous Contrast Given 06/10/20 1610)    ED Course  I have reviewed the triage vital signs and the nursing notes.  Pertinent labs & imaging results that were available during my care of the patient were reviewed by me and considered in my medical decision making (see chart for details).  Clinical Course as of 06/10/20 1040  Fri Jun 10, 2020  [id number] Spoke with the patient's wife at bedside.  Update provided.  She reports that the patient has been feeling poorly all week.  He was also complaining of some weakness in his legs earlier this week, but she did not notice any weakness in his arm or changes in his speech until this morning.  She notes that he has had vomiting intermittently over the last few days.  She states that when he fell earlier today that he landed on his right side.  She reports that earlier he was endorsing chest pain, but the patient denies chest pain at this time as well as shortness of breath and cough.  She also reports that he was initially very combative earlier this morning, but this is since resolved. [MM]    Clinical Course User Index [MM] Samantha Olivera, Coral Else, PA-C   MDM Rules/Calculators/A&P                          79 year old male with a h/o of A. Fib on Eliquis, AAA s/p stent graft in 2006, CAD s/p stent to the LAD and diagonal branch in 2005 and restenting at [facility] subsequently, GERD, HTN, HLD, vertigo, and OSA who presents the emergency department as a code stroke for right-sided weakness involving a fall at approximately 05:00. LKN was 23:00. NIH scale 15.   The patient was seen and independently evaluated by Dr. Bernette Mayers, attending physician.  On exam, there is significant right-sided upper and lower extremity weakness, ptosis on the right, dysarthria, neglect, ataxia  with finger-nose and heel-to-shin on the left.  Unable to compare the right side as patient is unable to form these tests.   Labs and imaging have been independently reviewed and interpreted by me. There is mild hypokalemia, likely secondary to recent vomiting.  Hemoglobin is normal and stable.  Labs are otherwise reassuring.  CT head with acute pontine hemorrhage.  There is no mass-effect, extra axial or intraventricular extension of  blood.  The patient was seen and independently evaluated by Dr. Otelia Limes, neurology.  Who will admit the patient to the neuro ICU.  Patient's wife did report that the patient was having chest pain earlier, but he declines at this time.  Will add on troponin.  She also reports that he has been feeling unwell over the last week with some vomiting.  We will add on chest x-ray and urinalysis, which are pending.  The patient appears reasonably stabilized for admission considering the current resources, flow, and capabilities available in the ED at this time, and I doubt any other [facility] requiring further screening and/or treatment in the ED prior to admission.   Final Clinical Impression(s) / ED Diagnoses Final diagnoses:  ICH (intracerebral hemorrhage) (HCC)  Hypokalemia    Rx / DC Orders ED Discharge Orders    None       Barkley Boards, PA-C 06/10/20 1040    Pollyann Savoy, MD 06/10/20 2319

## 2020-06-10 NOTE — ED Triage Notes (Signed)
BIB [facility] from home. Pt got out of bed at 0500 and fell. Wife states he was acting different. Pt on eliquis. Has slurred speech, right side weakness, right side facial droop. CBG 124. Last known well 2300.   146/92 77 heart rate 94% room air.

## 2020-06-10 NOTE — Progress Notes (Signed)
  Echocardiogram 2D Echocardiogram has been performed.  Gerda Diss 06/10/2020, 2:35 PM

## 2020-06-11 ENCOUNTER — Inpatient Hospital Stay (HOSPITAL_COMMUNITY): Payer: Medicare Other

## 2020-06-11 ENCOUNTER — Encounter (HOSPITAL_COMMUNITY): Payer: Self-pay | Admitting: Neurology

## 2020-06-11 LAB — CBC
HCT: 44.6 % (ref 39.0–52.0)
Hemoglobin: 15.3 g/dL (ref 13.0–17.0)
MCH: 30.5 pg (ref 26.0–34.0)
MCHC: 34.3 g/dL (ref 30.0–36.0)
MCV: 89 fL (ref 80.0–100.0)
Platelets: 128 10*3/uL — ABNORMAL LOW (ref 150–400)
RBC: 5.01 MIL/uL (ref 4.22–5.81)
RDW: 13.5 % (ref 11.5–15.5)
WBC: 6.5 10*3/uL (ref 4.0–10.5)
nRBC: 0 % (ref 0.0–0.2)

## 2020-06-11 LAB — GLUCOSE, CAPILLARY
Glucose-Capillary: 111 mg/dL — ABNORMAL HIGH (ref 70–99)
Glucose-Capillary: 115 mg/dL — ABNORMAL HIGH (ref 70–99)
Glucose-Capillary: 150 mg/dL — ABNORMAL HIGH (ref 70–99)
Glucose-Capillary: 157 mg/dL — ABNORMAL HIGH (ref 70–99)
Glucose-Capillary: 159 mg/dL — ABNORMAL HIGH (ref 70–99)
Glucose-Capillary: 191 mg/dL — ABNORMAL HIGH (ref 70–99)

## 2020-06-11 LAB — BASIC METABOLIC PANEL
Anion gap: 11 (ref 5–15)
BUN: 16 mg/dL (ref 8–23)
CO2: 22 mmol/L (ref 22–32)
Calcium: 8.3 mg/dL — ABNORMAL LOW (ref 8.9–10.3)
Chloride: 107 mmol/L (ref 98–111)
Creatinine, Ser: 1.09 mg/dL (ref 0.61–1.24)
GFR, Estimated: >60 mL/min (ref >60)
Glucose, Bld: 131 mg/dL — ABNORMAL HIGH (ref 70–99)
Potassium: 3.4 mmol/L — ABNORMAL LOW (ref 3.5–5.1)
Sodium: 140 mmol/L (ref 135–145)

## 2020-06-11 LAB — HEMOGLOBIN A1C
Hgb A1c MFr Bld: 6.1 % — ABNORMAL HIGH (ref 4.8–5.6)
Mean Plasma Glucose: 128.37 mg/dL

## 2020-06-11 MED ORDER — PNEUMOCOCCAL VAC POLYVALENT 25 MCG/0.5ML IJ INJ: 0.5000 mL | INJECTION | INTRAMUSCULAR | Status: DC | PRN

## 2020-06-11 MED ORDER — AMLODIPINE BESYLATE 10 MG PO TABS
10.0000 mg | ORAL_TABLET | Freq: Every day | ORAL | Status: DC
  Administered 2020-06-11 – 2020-06-16 (×6): 10 mg via ORAL
  Filled 2020-06-11 (×6): qty 1

## 2020-06-11 MED ORDER — PANTOPRAZOLE SODIUM 40 MG PO TBEC
40.0000 mg | DELAYED_RELEASE_TABLET | Freq: Every day | ORAL | Status: DC
  Administered 2020-06-11 – 2020-06-12 (×2): 40 mg via ORAL
  Filled 2020-06-11 (×2): qty 1

## 2020-06-11 MED ORDER — POTASSIUM CHLORIDE 20 MEQ PO PACK: 40.0000 meq | PACK | Freq: Once | ORAL | Status: DC

## 2020-06-11 MED ORDER — ORAL CARE MOUTH RINSE
15.0000 mL | Freq: Two times a day (BID) | OROMUCOSAL | Status: DC
  Administered 2020-06-11 – 2020-06-16 (×10): 15 mL via OROMUCOSAL

## 2020-06-11 MED ORDER — POTASSIUM CHLORIDE 20 MEQ PO PACK
40.0000 meq | PACK | Freq: Once | ORAL | Status: AC
  Administered 2020-06-11: 15:00:00 40 meq via ORAL
  Filled 2020-06-11: qty 2

## 2020-06-11 MED ORDER — METOPROLOL TARTRATE 25 MG PO TABS
25.0000 mg | ORAL_TABLET | Freq: Every day | ORAL | Status: DC
  Administered 2020-06-11 – 2020-06-12 (×2): 25 mg via ORAL
  Filled 2020-06-11 (×2): qty 1

## 2020-06-11 MED ORDER — INSULIN ASPART 100 UNIT/ML ~~LOC~~ SOLN
0.0000 [IU] | SUBCUTANEOUS | Status: DC
  Administered 2020-06-11: 16:00:00 3 [IU] via SUBCUTANEOUS
  Administered 2020-06-12 – 2020-06-14 (×8): 2 [IU] via SUBCUTANEOUS
  Administered 2020-06-15: 14:00:00 3 [IU] via SUBCUTANEOUS
  Administered 2020-06-15 – 2020-06-16 (×5): 2 [IU] via SUBCUTANEOUS

## 2020-06-11 NOTE — Progress Notes (Signed)
Inpatient Rehab Admissions Coordinator Note:   Per PT/OT/ST recommendations, pt was screened for CIR candidacy by Wolfgang Phoenix, MS, CCC-SLP.  At this time we are recommending an inpatient rehab consult.  AC will contact attending to request consult order.  Please contact me with questions.    Wolfgang Phoenix, MS, CCC-SLP Admissions Coordinator [phone] 06/11/20 5:37 PM

## 2020-06-11 NOTE — Plan of Care (Signed)
Patient completed swallow study and tolerating Dysphagia 2 diet with assistance. He is able to communicate needs and is involved in plan of care. Wife at bedside. Reviewed Stroke Mapping Book with both patient and his wife.

## 2020-06-11 NOTE — Evaluation (Signed)
Physical Therapy Evaluation Patient Details Name: [name] MRN: [id number] DOB: 05-02-1941 Today's Date: 06/11/2020   History of Present Illness  The pt is a 80 yo male presenting with sudden onset R-sided weakness and dysarthria. 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 PMH includes: afib on Eliquis, AAA s/p stent graft in 2006, CAD s/p stent to LAD in 2005, GERD, HTN, HLD, vertigo, and OSA.    Clinical Impression  Pt in bed upon arrival of PT, agreeable to evaluation at this time. Prior to admission the pt was completely independent with ADLs, working as a Curator, and walking with intermittent use of SPC. The pt now presents with limitations in functional mobility, strength, coordination, stability, and endurance due to above dx, and will continue to benefit from skilled PT to address these deficits. The pt was able to complete bed mobility and initial sit-stand transfers at this time, but required maxA of 2 to complete all mobility and was unable to maintain standing balance to achieve wt shift for stepping at this time. He will continue to benefit from intensive therapies to achieve goals of returning to prior level of independence.      Follow Up Recommendations CIR    Equipment Recommendations   (defer to post acute)    Recommendations for Other Services Rehab consult     Precautions / Restrictions Precautions Precautions: Fall Precaution Comments: R sided hemiplegia Restrictions Weight Bearing Restrictions: No      Mobility  Bed Mobility Overal bed mobility: Needs Assistance Bed Mobility: Supine to Sit;Sit to Supine     Supine to sit: Max assist;+2 for physical assistance;+2 for safety/equipment Sit to supine: Max assist;+2 for physical assistance;+2 for safety/equipment   General bed mobility comments: pt able to assist with moving LLE and reaching with LUE, requires assist for RLE managment, trunk and to significant assist to scoot hips towards EOB     Transfers Overall transfer level: Needs assistance Equipment used: 2 person hand held assist Transfers: Sit to/from Stand Sit to Stand: Max assist;+2 physical assistance;+2 safety/equipment         General transfer comment: via face to face and with R knee block; boosting/steadying assist throughout with cues/assist to faciliate trunk upright and hip extension; sit<>stand x2 from EOB while RN changing linens and assisting with pericare  Ambulation/Gait             General Gait Details: pt unable to take steps at this time  Stairs            Wheelchair Mobility    Modified Rankin (Stroke Patients Only) Modified Rankin (Stroke Patients Only) Pre-Morbid Rankin Score: No symptoms Modified Rankin: Severe disability     Balance Overall balance assessment: Needs assistance Sitting-balance support: Feet supported Sitting balance-Leahy Scale: Poor Sitting balance - Comments: at least minA up to maxA for balance (when fatigued) Postural control: Right lateral lean Standing balance support: Single extremity supported;Bilateral upper extremity supported Standing balance-Leahy Scale: Zero Standing balance comment: maxA+2                             Pertinent Vitals/Pain Pain Assessment: No/denies pain Faces Pain Scale: No hurt Pain Intervention(s): Monitored during session    Home Living Family/patient expects to be discharged to:: Private residence Living Arrangements: Spouse/significant other Available Help at Discharge: Family Type of Home: House Home Access: Stairs to enter Entrance Stairs-Rails:  (grab bar by steps) Entrance Stairs-Number of Steps: 2 Home  Layout: One level Home Equipment: Walker - 4 wheels;Cane - single point;Shower seat      Prior Function Level of Independence: Independent         Comments: pt driving, working as a Retail buyer, independent with intermittent use of SPC     Hand Dominance   Dominant Hand:  Right    Extremity/Trunk Assessment   Upper Extremity Assessment Upper Extremity Assessment: Defer to OT evaluation RUE Deficits / Details: flaccid RUE RUE Coordination: decreased fine motor;decreased gross motor    Lower Extremity Assessment Lower Extremity Assessment: RLE deficits/detail RLE Deficits / Details: no active ROM or trace activation in RLE at this time RLE Sensation: decreased light touch RLE Coordination: decreased fine motor    Cervical / Trunk Assessment Cervical / Trunk Assessment: Other exceptions;Kyphotic Cervical / Trunk Exceptions: large body habitus  Communication   Communication: Expressive difficulties  Cognition Arousal/Alertness: Awake/alert;Lethargic Behavior During Therapy: Flat affect Overall Cognitive Status: Impaired/Different from baseline Area of Impairment: Attention;Memory;Following commands;Awareness;Problem solving                   Current Attention Level: Sustained Memory: Decreased short-term memory Following Commands: Follows one step commands with increased time   Awareness: Emergent Problem Solving: Slow processing;Decreased initiation;Requires verbal cues;Requires tactile cues General Comments: pt with dysarthric speech, aware of some of his deficits including R side weakness      General Comments General comments (skin integrity, edema, etc.): pt in afib during session with additional VSS, spouse present and supportive    Exercises     Assessment/Plan    PT Assessment Patient needs continued PT services  PT Problem List Decreased strength;Decreased range of motion;Decreased activity tolerance;Decreased balance;Decreased mobility;Decreased coordination;Decreased safety awareness       PT Treatment Interventions DME instruction;Gait training;Stair training;Functional mobility training;Therapeutic activities;Therapeutic exercise;Balance training;Neuromuscular re-education;Patient/family education    PT Goals  (Current goals can be found in the Care Plan section)  Acute Rehab PT Goals Patient Stated Goal: per spouse to increase his independence PT Goal Formulation: With patient/family Time For Goal Achievement: 06/25/20 Potential to Achieve Goals: Fair    Frequency Min 4X/week   Barriers to discharge        Co-evaluation PT/OT/SLP Co-Evaluation/Treatment: Yes Reason for Co-Treatment: Complexity of the patient's impairments (multi-system involvement);Necessary to address cognition/behavior during functional activity;For patient/therapist safety;To address functional/ADL transfers PT goals addressed during session: Mobility/safety with mobility;Balance;Strengthening/ROM OT goals addressed during session: ADL's and self-care       AM-PAC PT "6 Clicks" Mobility  Outcome Measure Help needed turning from your back to your side while in a flat bed without using bedrails?: A Lot Help needed moving from lying on your back to sitting on the side of a flat bed without using bedrails?: A Lot Help needed moving to and from a bed to a chair (including a wheelchair)?: Total Help needed standing up from a chair using your arms (e.g., wheelchair or bedside chair)?: A Lot Help needed to walk in hospital room?: Total Help needed climbing 3-5 steps with a railing? : Total 6 Click Score: 9    End of Session Equipment Utilized During Treatment: Gait belt Activity Tolerance: Patient tolerated treatment well Patient left: in bed;with call bell/phone within reach;with bed alarm set;with nursing/sitter in room;with family/visitor present Nurse Communication: Mobility status PT Visit Diagnosis: Unsteadiness on feet (R26.81);Other abnormalities of gait and mobility (R26.89);Muscle weakness (generalized) (M62.81);Hemiplegia and hemiparesis Hemiplegia - Right/Left: Right Hemiplegia - dominant/non-dominant: Dominant Hemiplegia - caused by: Nontraumatic intracerebral hemorrhage  Time: 1025-8527 PT Time  Calculation (min) (ACUTE ONLY): 31 min   Charges:   PT Evaluation $PT Eval Moderate Complexity: 1 Mod          Rolm Baptise, PT, DPT   Acute Rehabilitation Department Pager #: [phone]  [name] 06/11/2020, 5:23 PM

## 2020-06-11 NOTE — Progress Notes (Signed)
PT Cancellation Note  Patient Details Name: [name] MRN: [id number] DOB: 21-Jul-1940   Cancelled Treatment:    Reason Eval/Treat Not Completed: Patient at procedure or test/unavailable. At time of PT arrival, the pt is leaving the unit for barium swallow eval. PT will continue to follow and evaluate as time/schedule allow.   [name], DPT   Acute Rehabilitation Department Pager #: [phone]   [name] 06/11/2020, 11:00 AM

## 2020-06-11 NOTE — Evaluation (Signed)
Occupational Therapy Evaluation Patient Details Name: [name] MRN: [id number] DOB: Dec 06, 1940 Today's Date: 06/11/2020    History of Present Illness Pt is a 79 yo male presenting with sudden onset R-sided weakness and dysarthria. 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 PMH includes: afib on Eliquis, AAA s/p stent graft in 2006, CAD s/p stent to LAD in 2005, GERD, HTN, HLD, vertigo, and OSA.   Clinical Impression   This 79 y/o male presents with the above. PTA pt independent (intermittent use of SPC) with ADL and mobility tasks, was still working. Pt agreeable to working with therapies, seen in conjunction with PT. He presents with the above and below listed deficits including notable R side weakness, impaired vision and cognition, poor sitting/standing balance. Pt requiring two person assist for completion of functional transfers, tolerating sit<>stand x2 from EOB during session completion while RN assisting with bed linen change. Pt requiring up to totalA for LB ADL. He will benefit from continued acute OT services and currently feel he is appropriate candidate for CIR level therapies at time of discharge to maximize his overall safety and independence with ADL and mobility. Will follow.     Follow Up Recommendations  CIR    Equipment Recommendations  3 in 1 bedside commode;Other (comment) (TBD)    Recommendations for Other Services Rehab consult     Precautions / Restrictions Precautions Precautions: Fall Precaution Comments: R hemi Restrictions Weight Bearing Restrictions: No      Mobility Bed Mobility Overal bed mobility: Needs Assistance Bed Mobility: Supine to Sit;Sit to Supine     Supine to sit: Max assist;+2 for physical assistance;+2 for safety/equipment Sit to supine: Max assist;+2 for physical assistance;+2 for safety/equipment   General bed mobility comments: pt able to assist with moving LLE and reaching with LUE, requires assist for RLE managment,  trunk and to significant assist to scoot hips towards EOB    Transfers Overall transfer level: Needs assistance Equipment used: 2 person hand held assist Transfers: Sit to/from Stand Sit to Stand: Max assist;+2 physical assistance;+2 safety/equipment         General transfer comment: via face to face and with R knee block; boosting/steadying assist throughout with cues/assist to faciliate trunk upright and hip extension; sit<>stand x2 from EOB while RN changing linens and assisting with pericare    Balance Overall balance assessment: Needs assistance Sitting-balance support: Feet supported Sitting balance-Leahy Scale: Poor Sitting balance - Comments: at least minA up to maxA for balance (when fatigued) Postural control: Right lateral lean Standing balance support: Single extremity supported;Bilateral upper extremity supported Standing balance-Leahy Scale: Zero Standing balance comment: maxA+2                           ADL either performed or assessed with clinical judgement   ADL Overall ADL's : Needs assistance/impaired Eating/Feeding: Sitting;Moderate assistance   Grooming: Moderate assistance;Sitting   Upper Body Bathing: Maximal assistance;Sitting   Lower Body Bathing: Total assistance;+2 for physical assistance;+2 for safety/equipment;Sit to/from stand;Bed level   Upper Body Dressing : Maximal assistance;Sitting Upper Body Dressing Details (indicate cue type and reason): donning new gown Lower Body Dressing: Total assistance;+2 for safety/equipment;+2 for physical assistance;Sit to/from stand;Bed level Lower Body Dressing Details (indicate cue type and reason): assist for socks; maxA+2 for sit<>stand     Toileting- Clothing Manipulation and Hygiene: Total assistance;+2 for physical assistance;+2 for safety/equipment;Sit to/from stand Toileting - Clothing Manipulation Details (indicate cue type and reason): assist to  stand with PT/OT while RN assisting with  pericare     Functional mobility during ADLs: Maximal assistance;+2 for physical assistance;+2 for safety/equipment (sit<>stand)       Vision Baseline Vision/History: Wears glasses Wears Glasses: Reading only Patient Visual Report: Blurring of vision Vision Assessment?: Vision impaired- to be further tested in functional context Additional Comments: spouse reports pt was unable to read the bible this morning (which he reads daily) - will continue to assess. pt too lethargic end of this session to formally assess     Perception     Praxis      Pertinent Vitals/Pain Pain Assessment: Faces Faces Pain Scale: No hurt Pain Intervention(s): Monitored during session     Hand Dominance Right   Extremity/Trunk Assessment Upper Extremity Assessment Upper Extremity Assessment: RUE deficits/detail RUE Deficits / Details: flaccid RUE RUE Coordination: decreased fine motor;decreased gross motor   Lower Extremity Assessment Lower Extremity Assessment: Defer to PT evaluation   Cervical / Trunk Assessment Cervical / Trunk Assessment: Kyphotic   Communication Communication Communication: Expressive difficulties   Cognition Arousal/Alertness: Awake/alert;Lethargic Behavior During Therapy: Flat affect Overall Cognitive Status: Difficult to assess Area of Impairment: Attention;Memory;Following commands;Awareness;Problem solving                   Current Attention Level: Sustained Memory: Decreased short-term memory Following Commands: Follows one step commands with increased time   Awareness: Emergent Problem Solving: Slow processing;Decreased initiation;Requires verbal cues;Requires tactile cues General Comments: pt with dysarthric speech, aware of some of his deficits including R side weakness   General Comments  pt in afib during session with additional VSS, spouse present and supportive    Exercises     Shoulder Instructions      Home Living Family/patient expects  to be discharged to:: Private residence Living Arrangements: Spouse/significant other Available Help at Discharge: Family Type of Home: House Home Access: Stairs to enter Secretary/administrator of Steps: 2 Entrance Stairs-Rails:  (grab bar by steps) Home Layout: One level     Bathroom Shower/Tub: Chief Strategy Officer: Handicapped height     Home Equipment: Environmental consultant - 4 wheels;Cane - single point;Shower seat          Prior Functioning/Environment Level of Independence: Independent;Independent with assistive device(s)        Comments: intermittent use of SPC, was still driving/working as a Advertising copywriter        OT Problem List: Decreased strength;Decreased range of motion;Decreased activity tolerance;Impaired balance (sitting and/or standing);Impaired vision/perception;Decreased coordination;Decreased cognition;Decreased safety awareness;Decreased knowledge of use of DME or AE;Obesity;Impaired UE functional use      OT Treatment/Interventions: Self-care/ADL training;Therapeutic exercise;Energy conservation;DME and/or AE instruction;Therapeutic activities;Cognitive remediation/compensation;Visual/perceptual remediation/compensation;Patient/family education;Balance training    OT Goals(Current goals can be found in the care plan section) Acute Rehab OT Goals Patient Stated Goal: per spouse to increase his independence OT Goal Formulation: With patient/family Time For Goal Achievement: 06/25/20 Potential to Achieve Goals: Good  OT Frequency: Min 2X/week   Barriers to D/C:            Co-evaluation PT/OT/SLP Co-Evaluation/Treatment: Yes Reason for Co-Treatment: Complexity of the patient's impairments (multi-system involvement);To address functional/ADL transfers;For patient/therapist safety   OT goals addressed during session: ADL's and self-care      AM-PAC OT "6 Clicks" Daily Activity     Outcome Measure Help from another person eating meals?: A  Lot Help from another person taking care of personal grooming?: A Lot Help from another person toileting, which includes  using toliet, bedpan, or urinal?: Total Help from another person bathing (including washing, rinsing, drying)?: A Lot Help from another person to put on and taking off regular upper body clothing?: A Lot Help from another person to put on and taking off regular lower body clothing?: Total 6 Click Score: 10   End of Session Equipment Utilized During Treatment: Gait belt Nurse Communication: Mobility status  Activity Tolerance: Patient tolerated treatment well Patient left: in bed;with call bell/phone within reach;with bed alarm set;with family/visitor present  OT Visit Diagnosis: Other abnormalities of gait and mobility (R26.89);Hemiplegia and hemiparesis;Cognitive communication deficit (R41.841) Symptoms and signs involving cognitive functions: Nontraumatic intracerebral hemorrhage Hemiplegia - Right/Left: Right Hemiplegia - dominant/non-dominant: Dominant Hemiplegia - caused by: Nontraumatic intracerebral hemorrhage                Time: 8466-5993 OT Time Calculation (min): 30 min Charges:  OT General Charges $OT Visit: 1 Visit OT Evaluation $OT Eval Moderate Complexity: 1 Mod  Marcy Siren, OT Acute Rehabilitation Services Pager [phone] Office [phone]  Orlando Penner 06/11/2020, 5:14 PM

## 2020-06-11 NOTE — Progress Notes (Signed)
STROKE TEAM PROGRESS NOTE   INTERVAL HISTORY He is sitting up and alert. No overnight events. No neuro changes. Still on cleviprex titrating down. Failed speech therapy last night. Barium swallow today. No oral contrak so can't give any oral medications yet. Grimaces to stim on the right but no movement.  OBJECTIVE Vitals:   06/11/20 0000 06/11/20 0400 06/11/20 0500 06/11/20 0600  BP:  115/61 123/71 130/69  Pulse:  88  85  Resp:  20  (!) 23  Temp: 98.3 F (36.8 C) 98.9 F (37.2 C)    TempSrc: Oral Oral    SpO2:  95%  92%  Weight:      Height:        CBC:  Recent Labs  Lab 06/10/20 0606 06/10/20 0647  WBC 6.0  --   NEUTROABS 4.0  --   HGB 15.9 16.3  HCT 47.0 48.0  MCV 89.9  --   PLT 120*  --     Basic Metabolic Panel:  Recent Labs  Lab 06/10/20 0606 06/10/20 0647 06/10/20 1330 06/10/20 1524  NA 139 139 136 141  K 3.4* 3.3*  --   --   CL 102 103  --   --   CO2 22  --   --   --   GLUCOSE 128* 123*  --   --   BUN 20 23  --   --   CREATININE 1.22 1.10  --   --   CALCIUM 8.8*  --   --   --     Lipid Panel:     Component Value Date/Time   CHOL 129 05/27/2020 0943   TRIG 64 05/27/2020 0943   HDL 32 (L) 05/27/2020 0943   CHOLHDL 4.0 05/27/2020 0943   CHOLHDL 5.8 11/20/2007 0425   VLDL 10 11/20/2007 0425   LDLCALC 84 05/27/2020 0943   HgbA1c: No results found for: HGBA1C Urine Drug Screen:     Component Value Date/Time   LABOPIA NONE DETECTED 06/10/2020 1328   COCAINSCRNUR NONE DETECTED 06/10/2020 1328   LABBENZ NONE DETECTED 06/10/2020 1328   AMPHETMU NONE DETECTED 06/10/2020 1328   THCU NONE DETECTED 06/10/2020 1328   LABBARB NONE DETECTED 06/10/2020 1328    Alcohol Level     Component Value Date/Time   ETH <10 06/10/2020 0606    IMAGING  CT Code Stroke CTA Head W/WO contrast CT Code Stroke CTA Neck W/WO contrast CT Code Stroke Cerebral Perfusion with contrast 06/10/2020 IMPRESSION:  1. Poorly enhancing dolichoectatic Basilar Artery,  although the vessel does appear to remain patent. 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   2. Subsequently, there is confluent abnormal posterior circulation T-max >6s on CTP, with sparing the Left PCA territory on the basis of a fetal PCA origin on that side. No infarct core detected on CTP.  3. No CTA spot sign detected in the acute ventral pontine hemorrhage.  4. Bilateral calcified carotid artery atherosclerosis, 65-70% stenosis at the Left ICA bulb. No hemodynamically significant stenosis on the right. Dolichoectatic ICA siphons.  5. Aortic Atherosclerosis (ICD10-I70.0).   CT HEAD CODE STROKE WO CONTRAST 06/10/2020 IMPRESSION:  1. 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 No mass effect, extra-axial- or intraventricular extension of blood at this time.  2. 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   CT head  WO Contrast  06/11/2020 IMPRESSION: Unchanged small focus of hemorrhage in the brainstem.  MRI head WO Contrast 06/10/2020 IMPRESSION:  Negative for acute ischemic infarct  14 mm acute hemorrhage in the ventral pons without underlying mass or change from earlier today.  Numerous areas of chronic microhemorrhage in the brain bilaterally. Correlate with history of poorly controlled hypertension. If no such history is available, consider cerebral amyloid.  Chest Port 1 View 06/10/2020 IMPRESSION:  1. No acute cardiopulmonary abnormality.  2. Chronic thoracic Aortic Endograft.   Transthoracic Echocardiogram  06/10/2020 IMPRESSIONS  1. Left ventricular ejection fraction, by estimation, is 60 to 65%. The  left ventricle has normal function. The left ventricle has no regional  wall motion abnormalities. There is  moderate left ventricular hypertrophy.  Left ventricular diastolic function  could not be evaluated.  2. Right ventricular systolic function is normal. The right ventricular  size is normal. Tricuspid regurgitation signal is inadequate for assessing  PA pressure.  3. Left atrial size was mild to moderately dilated.  4. The mitral valve is normal in structure. Trivial mitral valve  regurgitation.  5. The aortic valve is tricuspid. There is mild calcification of the  aortic valve. There is mild thickening of the aortic valve. Aortic valve  regurgitation is mild. Mild aortic valve stenosis.  6. Aortic dilatation noted. There is mild dilatation of the ascending  aorta, measuring 42 mm.    ECG - atrial fibrillation - ventricular response 91BPM (See cardiology reading for complete details)   PHYSICAL EXAM  Temp:  [98.3 F (36.8 C)-98.9 F (37.2 C)] 98.9 F (37.2 C) (12/18 0400) Pulse Rate:  [80-111] 85 (12/18 0600) Resp:  [14-33] 23 (12/18 0600) BP: (111-158)/(61-131) 130/69 (12/18 0600) SpO2:  [91 %-97 %] 92 % (12/18 0600) Weight:  [96.2 kg] 96.2 kg (12/17 1747)  General - Well nourished, well developed, in no apparent distress, mildly lethargic.  Ophthalmologic - fundi not visualized due to noncooperation.  Cardiovascular - irregularly irregular heart rate and rhythm.  Neuro - awake, alert, orientated x3.  However severe dysarthria, no aphasia, able to name and repeat, follow simple commands.  Visual field full, bilateral gaze completed, however with left gaze, sustained nystagmus.  PERRL.  Right facial droop, tongue protrusion to the right.  Right upper extremity increased tone 0/5, right lower extremity 2 -/5 proximally, 0/5 distally.  Left upper lower extremity 5/5 left lower extremity at least 4/5.  Sensation symmetrical.  Left finger-to-nose ataxic. Grimaces to pain left upper and lower.    ASSESSMENT/PLAN Mr. Lou Loewe is a 79 y.o. male with history of  atrial  fibrillation (on Eliquis - but missed last evening dose), AAA s/p stent graft in 2006, CAD, shingles x 2, HLD, HTN, sleep apnea and vertigo presenting with severe Rt sided weakness, facial droop and severe dysarthria. He did not receive IV t-PA due to ICH.  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 No mass effect, extra-axial- or intraventricular extension of blood at this time. 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 Hemodynamically significant bilateral vertebral artery stenoses (bilateral distal V4 and also dominant Left vertebral origin). Bilateral calcified carotid artery atherosclerosis, 65-70% stenosis at the Left ICA bulb.  MRI head ventral pontine acute  hemorrhage 14 mm.  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  CT head repeat - Unchanged small focus of hemorrhage in the brainstem.  2D Echo - EF 60 - 65%. No cardiac source of emboli identified.   [employer] Virus 2 - negative  LDL - 84  HgbA1c - pending  UDS - neg  VTE prophylaxis - SCDs  Eliquis (apixaban) daily prior to admission, now on No antithrombotic  Ongoing aggressive stroke risk factor management  Therapy recommendations:  pending  Disposition:  Pending  Chronic afib  On eliquis PTA  Received Kcentra reversal   Now on no antithrombotics  Given MRI showing numerous chronic microhemorrhages bilaterally, concerning for hypertensive versus cerebral amyloid, likely not to be future anticoagulation candidate  Hypertension  Home BP meds: metoprolol ; amlodipine  Current BP meds: Cleviprex IV . SBP goal < 140 mm Hg . Add p.o. BP meds once p.o.  access . Long-term BP goal normotensive  Hyperlipidemia  Home Lipid lowering medication: none   LDL 84, goal < 70  Hold off statin given current ICH  Consider statin at discharge  Dysphagia  Did not pass swallow  NPO  IV fluid  Speech to follow  Other Stroke Risk Factors  Advanced age  Overweight, Body mass index is 27.23 kg/m., recommend weight loss, diet and exercise as appropriate   Coronary artery disease  Obstructive sleep apnea  AAA stat post stenting in 2006  Other Active Problems, Findings and Recommendations  Code status - Full Code  Aortic Atherosclerosis (ICD10-I70.0)   Hypokalemia - potassium - 3.4->3.3 - supplement - pending  Mild thrombocytopenia - platelets - 120  Hospital day # 1  Personally examined patient and images, and have participated in and made any corrections needed to history, physical, neuro exam,assessment and plan as stated above.  I have personally obtained the history, evaluated lab date, reviewed imaging studies and agree with radiology interpretations.    Naomie Dean, MD Stroke Neurology  I spent 35 minutes of face-to-face and non-face-to-face time with patient. This included prechart review, lab review, study review, order entry, electronic health record documentation, patient education on the different diagnostic and therapeutic options, counseling and coordination of care, risks and benefits of management, compliance, or risk factor reduction    To contact Stroke Continuity provider, please refer to [email or web address]. After hours, contact General Neurology

## 2020-06-11 NOTE — Progress Notes (Signed)
Modified Barium Swallow Progress Note  Patient Details  Name: [name] MRN: [id number] Date of Birth: 1941/01/20  Today's Date: 06/11/2020  Modified Barium Swallow completed.  Full report located under Chart Review in the Imaging Section.  Brief recommendations include the following:  Clinical Impression  Pt presents with moderate oropharyngeal dysphagia c/b decreased lingual strength and coordination, premature spillage, delayed swallo initiation, incomplete laryngeal closure and diminished sensation.  These deficits led to aspiration of thin and nectar thick liquid before and during the swallow with variable cough response.  Aspiration of thin liquid occurred at posterior trachea prior to the swallow 2/2 penetration of bolus through interarytenoid space which traveled on the vocal folds to the anterior portion of the trachea.  With nectar thick liquid by cup, initially only transient penetration was observed but as study progressed, pt aspirated a significant amount of bolus prior to the swallow.  By straw, there was penetration of nectar thick liquid to the level of the vocal folds with aspiration during the swallow.  Cough response was inconsistent and significantly delayed.  Cough was ineffective to clear aspiration and penetration whether cued or reflexive.  There was no penetration or aspiration of honey thick liquid or solids.  There was prolonged oral phase with puree and solid textuers which increased with complexity of solid. With pill simulation there was prompt oral transit of tablet with honey thick liquid and no penetration or aspiraiton.  Esophageal transit of tablet appears to be [facility] as it could not be located on esphageal sweep.     Recommend chopped/ground diet with honey thick liquids with aspiration precautions as listed below.   Swallow Evaluation Recommendations       SLP Diet Recommendations: Dysphagia 2 (Fine chop) solids;Honey thick liquids   Liquid Administration  via: Cup;No straw   Medication Administration: Whole meds with liquid   Supervision: Staff to assist with self feeding   Compensations:   Slow rate;  Small sips/bites;  Follow solids with liquid;  Lingual sweep for clearance of pocketing;  check for oral residue   Postural Changes: Seated upright at 90 degrees   Oral Care Recommendations: Oral care BID        Kerrie Pleasure, MA, CCC-SLP Acute Rehabilitation Services Office: [phone]; Pager (12/18): [phone] 06/11/2020,12:59 PM

## 2020-06-12 LAB — GLUCOSE, CAPILLARY
Glucose-Capillary: 100 mg/dL — ABNORMAL HIGH (ref 70–99)
Glucose-Capillary: 104 mg/dL — ABNORMAL HIGH (ref 70–99)
Glucose-Capillary: 104 mg/dL — ABNORMAL HIGH (ref 70–99)
Glucose-Capillary: 110 mg/dL — ABNORMAL HIGH (ref 70–99)
Glucose-Capillary: 129 mg/dL — ABNORMAL HIGH (ref 70–99)
Glucose-Capillary: 133 mg/dL — ABNORMAL HIGH (ref 70–99)
Glucose-Capillary: 135 mg/dL — ABNORMAL HIGH (ref 70–99)

## 2020-06-12 LAB — BASIC METABOLIC PANEL
Anion gap: 13 (ref 5–15)
BUN: 14 mg/dL (ref 8–23)
CO2: 20 mmol/L — ABNORMAL LOW (ref 22–32)
Calcium: 8 mg/dL — ABNORMAL LOW (ref 8.9–10.3)
Chloride: 109 mmol/L (ref 98–111)
Creatinine, Ser: 0.91 mg/dL (ref 0.61–1.24)
GFR, Estimated: >60 mL/min (ref >60)
Glucose, Bld: 118 mg/dL — ABNORMAL HIGH (ref 70–99)
Potassium: 3.6 mmol/L (ref 3.5–5.1)
Sodium: 142 mmol/L (ref 135–145)

## 2020-06-12 LAB — CBC
HCT: 43.2 % (ref 39.0–52.0)
Hemoglobin: 14.7 g/dL (ref 13.0–17.0)
MCH: 30.4 pg (ref 26.0–34.0)
MCHC: 34 g/dL (ref 30.0–36.0)
MCV: 89.4 fL (ref 80.0–100.0)
Platelets: 123 10*3/uL — ABNORMAL LOW (ref 150–400)
RBC: 4.83 MIL/uL (ref 4.22–5.81)
RDW: 13.5 % (ref 11.5–15.5)
WBC: 6.7 10*3/uL (ref 4.0–10.5)
nRBC: 0 % (ref 0.0–0.2)

## 2020-06-12 MED ORDER — LABETALOL HCL 5 MG/ML IV SOLN
10.0000 mg | INTRAVENOUS | Status: DC | PRN
  Administered 2020-06-12 (×2): 10 mg via INTRAVENOUS
  Filled 2020-06-12: qty 4

## 2020-06-12 MED ORDER — HYDRALAZINE HCL 20 MG/ML IJ SOLN: 20.0000 mg | INTRAMUSCULAR | Status: DC | PRN

## 2020-06-12 MED ORDER — METOPROLOL SUCCINATE ER 25 MG PO TB24: 25.0000 mg | ORAL_TABLET | Freq: Every day | ORAL | Status: DC

## 2020-06-12 NOTE — Progress Notes (Addendum)
Addendum 06/13/20 - added that pt can take pills whole in puree as he tolerated this previously  [name], MA CCC-SLP  Acute Rehabilitation Services Pager [phone] Office [phone]  Original note below by Elio Forget, SLP  Speech Language Pathology Treatment: Dysphagia  Patient Details Name: [name] MRN: [id number] DOB: 1941/05/28 Today's Date: 06/12/2020 Time: 8099-8338 SLP Time Calculation (min) (ACUTE ONLY): 15 min  Assessment / Plan / Recommendation Clinical Impression  Patient seen to address dysphagia goals. RN had paged SLP in AM as she was concerned with him coughing with breakfast meal. Patient was able to maintain adequate alertness for short period, however is very lethargic. Per RN and spouse, he had a busy day yesterday, with an MBS, and PT session and SLP suspects this is causing or at least contributing to his lethargy today. (not on any sedating meds) Patient accepted teaspoon sips of honey thick juice (approximately 8-10 sips) and teaspoon bites of puree (pudding) (approximately 4-5 1/2 teaspoons). He did exhibit delays in swallow initiation and had delayed cough two times, however his voice remained clear and coughing incidents were very brief. SLP spent some time discussing MBS results with patient's spouse. Recommendation is to continue on Dys 1, honey thick liquids but only give small amounts when patient is alert enough and carefully monitor his alertness when giving PO.'s    HPI HPI: 79 y.o. male with atrial fibrillation on Eliquis, presenting with an acute anterior midline pontine hemorrhage  Neuro report includes  severe dysarthria, right facial droop and right hemiplegia.      SLP Plan  Continue with current plan of care       Recommendations  Diet recommendations: Dysphagia 1 (puree);Honey-thick liquid Liquids provided via: Teaspoon;Cup Medication Administration: whole with puree Supervision: Full supervision/cueing for compensatory  strategies;Staff to assist with self feeding Compensations: Slow rate;Small sips/bites;Follow solids with liquid;Lingual sweep for clearance of pocketing Postural Changes and/or Swallow Maneuvers: Seated upright 90 degrees                Oral Care Recommendations: Oral care QID Follow up Recommendations: Inpatient Rehab SLP Visit Diagnosis: Dysphagia, oropharyngeal phase (R13.12) Plan: Continue with current plan of care       GO                Angela Nevin, MA, CCC-SLP Speech Therapy [facility] Acute Rehab

## 2020-06-12 NOTE — Progress Notes (Signed)
Inpatient Rehab Admissions:  Inpatient Rehab Consult received.  I met with patient at the bedside for rehabilitation assessment and to discuss goals and expectations of an inpatient rehab admission.  Pt acknowledged understanding.  Pt interested in pursuing CIR.  Pt gave permission for [facility] to contact wife, Thayer Headings.  Left message; awaiting return call.  Will continue to follow.  Signed: Gayland Curry, [place], [place] Admissions Coordinator [phone]

## 2020-06-12 NOTE — Progress Notes (Addendum)
STROKE TEAM PROGRESS NOTE   INTERVAL HISTORY CIR recommended. No improvement in exam. Passed MBS to dyphagia diet. Watch for aspiration. Speech is following.Off of cleviprex but BP borderline, we adjusted BP meds over the weekend may consider adjusting further Monday  OBJECTIVE Vitals:   06/12/20 1200 06/12/20 1230 06/12/20 1245 06/12/20 1513  BP: (!) 141/75 137/89 (!) 137/94   Pulse: 75 86 (!) 41   Resp: (!) 23 19 (!) 21   Temp:    98.3 F (36.8 C)  TempSrc:    Axillary  SpO2: 98% 98% 97%   Weight:      Height:        CBC:  Recent Labs  Lab 06/10/20 0606 06/10/20 0647 06/11/20 0634 06/12/20 0241  WBC 6.0  --  6.5 6.7  NEUTROABS 4.0  --   --   --   HGB 15.9   < > 15.3 14.7  HCT 47.0   < > 44.6 43.2  MCV 89.9  --  89.0 89.4  PLT 120*  --  128* 123*   < > = values in this interval not displayed.    Basic Metabolic Panel:  Recent Labs  Lab 06/11/20 0634 06/12/20 0241  NA 140 142  K 3.4* 3.6  CL 107 109  CO2 22 20*  GLUCOSE 131* 118*  BUN 16 14  CREATININE 1.09 0.91  CALCIUM 8.3* 8.0*    Lipid Panel:     Component Value Date/Time   CHOL 129 05/27/2020 0943   TRIG 64 05/27/2020 0943   HDL 32 (L) 05/27/2020 0943   CHOLHDL 4.0 05/27/2020 0943   CHOLHDL 5.8 11/20/2007 0425   VLDL 10 11/20/2007 0425   LDLCALC 84 05/27/2020 0943   HgbA1c:  Lab Results  Component Value Date   HGBA1C 6.1 (H) 06/11/2020   Urine Drug Screen:     Component Value Date/Time   LABOPIA NONE DETECTED 06/10/2020 1328   COCAINSCRNUR NONE DETECTED 06/10/2020 1328   LABBENZ NONE DETECTED 06/10/2020 1328   AMPHETMU NONE DETECTED 06/10/2020 1328   THCU NONE DETECTED 06/10/2020 1328   LABBARB NONE DETECTED 06/10/2020 1328    Alcohol Level     Component Value Date/Time   ETH <10 06/10/2020 0606    IMAGING  CT Code Stroke CTA Head W/WO contrast CT Code Stroke CTA Neck W/WO contrast CT Code Stroke Cerebral Perfusion with contrast 06/10/2020 IMPRESSION:  1. Poorly enhancing  dolichoectatic Basilar Artery, although the vessel does appear to remain patent. 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   2. Subsequently, there is confluent abnormal posterior circulation T-max >6s on CTP, with sparing the Left PCA territory on the basis of a fetal PCA origin on that side. No infarct core detected on CTP.  3. No CTA spot sign detected in the acute ventral pontine hemorrhage.  4. Bilateral calcified carotid artery atherosclerosis, 65-70% stenosis at the Left ICA bulb. No hemodynamically significant stenosis on the right. Dolichoectatic ICA siphons.  5. Aortic Atherosclerosis (ICD10-I70.0).   CT HEAD CODE STROKE WO CONTRAST 06/10/2020 IMPRESSION:  1. 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 No mass effect, extra-axial- or intraventricular extension of blood at this time.  2. 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   CT head WO Contrast  06/11/2020 IMPRESSION: Unchanged small focus of hemorrhage in the brainstem.  MRI head WO Contrast 06/10/2020 IMPRESSION:  Negative for acute ischemic infarct  14 mm acute hemorrhage in the ventral pons without underlying mass or change from earlier today.  Numerous areas of chronic microhemorrhage in the brain bilaterally. Correlate with history of poorly controlled hypertension. If no such history is available, consider cerebral amyloid.  Chest Port 1 View 06/10/2020 IMPRESSION:  1. No acute cardiopulmonary abnormality.  2. Chronic thoracic Aortic Endograft.   Transthoracic Echocardiogram  06/10/2020 IMPRESSIONS  1. Left ventricular ejection fraction, by estimation, is 60 to 65%. The  left ventricle has normal function. The left ventricle has no regional  wall motion  abnormalities. There is moderate left ventricular hypertrophy.  Left ventricular diastolic function  could not be evaluated.  2. Right ventricular systolic function is normal. The right ventricular  size is normal. Tricuspid regurgitation signal is inadequate for assessing  PA pressure.  3. Left atrial size was mild to moderately dilated.  4. The mitral valve is normal in structure. Trivial mitral valve  regurgitation.  5. The aortic valve is tricuspid. There is mild calcification of the  aortic valve. There is mild thickening of the aortic valve. Aortic valve  regurgitation is mild. Mild aortic valve stenosis.  6. Aortic dilatation noted. There is mild dilatation of the ascending  aorta, measuring 42 mm.    ECG - atrial fibrillation - ventricular response 91 BPM (See cardiology reading for complete details)   PHYSICAL EXAM  Temp:  [96.7 F (35.9 C)-98.6 F (37 C)] 98.3 F (36.8 C) (12/19 1513) Pulse Rate:  [41-117] 41 (12/19 1245) Resp:  [13-32] 21 (12/19 1245) BP: (116-169)/(58-97) 137/94 (12/19 1245) SpO2:  [93 %-100 %] 97 % (12/19 1245)  General - Well nourished, well developed, in no apparent distress, mildly lethargic.  Ophthalmologic - fundi not visualized due to noncooperation.  Cardiovascular - irregularly irregular heart rate and rhythm.  Neuro - awake, alert, orientated x3.  However severe dysarthria, no aphasia, able to name and repeat, follow simple commands.  Visual field full, bilateral gaze completed, however with left gaze, sustained nystagmus.  PERRL.  Right facial droop, tongue protrusion to the right.  Right upper extremity increased tone 0/5, right lower extremity 2 -/5 proximally, 0/5 distally.  Left upper lower extremity 5/5 left lower extremity at least 4/5.  Sensation symmetrical.  Left finger-to-nose ataxic. Grimaces to pain left upper and lower.    ASSESSMENT/PLAN [name] is a 79 y.o. male with history of  atrial fibrillation (on  Eliquis - but missed last evening dose), AAA s/p stent graft in 2006, CAD, shingles x 2, HLD, HTN, sleep apnea and vertigo presenting with severe Rt sided weakness, facial droop and severe dysarthria. He did not receive IV t-PA due to ICH.  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 No mass effect, extra-axial- or intraventricular extension of blood at this time. 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 Hemodynamically significant bilateral vertebral artery stenoses (bilateral distal V4 and also dominant Left vertebral origin). Bilateral calcified carotid artery atherosclerosis, 65-70% stenosis at the Left ICA bulb.  MRI head ventral pontine acute hemorrhage 14 mm.  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  CT head repeat - Unchanged small focus of  hemorrhage in the brainstem.  2D Echo - EF 60 - 65%. No cardiac source of emboli identified.   Sars Corona Virus 2 - negative  LDL - 84  HgbA1c - 6.1  UDS - neg  VTE prophylaxis - SCDs  Eliquis (apixaban) daily prior to admission, now on No antithrombotic  Ongoing aggressive stroke risk factor management. Given Amyloid and ICH, anticoagulation not recommended  Therapy recommendations: CIR recommended - Rehabilitation MD consult ordered  Disposition:  Pending  Chronic afib  On eliquis PTA  Received Kcentra reversal   Now on no antithrombotics  Given MRI showing numerous chronic microhemorrhages bilaterally, concerning for hypertensive versus cerebral amyloid, likely not to be future anticoagulation candidate  Hypertension  Home BP meds: metoprolol ; amlodipine  Current BP  meds: Cleviprex IV off - resume metoprolol (change to Toprol XL for once per day dosing) and amlodipine (12/19 - now able to take PO meds)  Off of cleviprex but BP borderline, we adjusted BP meds over the weekend may consider adjusting further Monday . SBP goal < 160 mm Hg . Add p.o. BP meds once p.o. access . Long-term BP goal normotensive  Hyperlipidemia  Home Lipid lowering medication: none   LDL 84, goal < 70  Hold off statin given current ICH  Consider statin at discharge  Dysphagia  Did not pass swallow  NPO -> 12/18 - SLP Diet Recommendations: Dysphagia 2 (Fine chop) solids;Honey thick liquids   IV fluid  Speech to follow  Other Stroke Risk Factors  Advanced age  Overweight, Body mass index is 27.23 kg/m., recommend weight loss, diet and exercise as appropriate   Coronary artery disease  Obstructive sleep apnea  AAA stat post stenting in 2006  Other Active Problems, Findings and Recommendations  Code status - Full Code  Aortic Atherosclerosis (ICD10-I70.0)   Hypokalemia - potassium - 3.4->3.3 - supplement ->3.6  Mild thrombocytopenia - platelets - 120->123  Hospital day # 2  Personally examined patient and images, and have participated in and made any corrections needed to history, physical, neuro exam,assessment and plan as stated above.  I have personally obtained the history, evaluated lab date, reviewed imaging studies and agree with radiology interpretations.    Naomie Dean, MD Stroke Neurology  I spent 15 minutes of face-to-face and non-face-to-face time with patient. This included prechart review, lab review, study review, order entry, electronic health record documentation, patient education on the different diagnostic and therapeutic options, counseling and coordination of care, risks and benefits of management, compliance, or risk factor reduction   To contact Stroke Continuity provider, please refer to [email or web address]. After hours, contact  General Neurology

## 2020-06-13 ENCOUNTER — Telehealth: Payer: Self-pay

## 2020-06-13 DIAGNOSIS — I613 Nontraumatic intracerebral hemorrhage in brain stem: Principal | ICD-10-CM

## 2020-06-13 LAB — GLUCOSE, CAPILLARY
Glucose-Capillary: 101 mg/dL — ABNORMAL HIGH (ref 70–99)
Glucose-Capillary: 106 mg/dL — ABNORMAL HIGH (ref 70–99)
Glucose-Capillary: 108 mg/dL — ABNORMAL HIGH (ref 70–99)
Glucose-Capillary: 130 mg/dL — ABNORMAL HIGH (ref 70–99)
Glucose-Capillary: 138 mg/dL — ABNORMAL HIGH (ref 70–99)
Glucose-Capillary: 92 mg/dL (ref 70–99)

## 2020-06-13 LAB — BASIC METABOLIC PANEL
Anion gap: 10 (ref 5–15)
BUN: 17 mg/dL (ref 8–23)
CO2: 22 mmol/L (ref 22–32)
Calcium: 8.1 mg/dL — ABNORMAL LOW (ref 8.9–10.3)
Chloride: 107 mmol/L (ref 98–111)
Creatinine, Ser: 0.99 mg/dL (ref 0.61–1.24)
GFR, Estimated: >60 mL/min (ref >60)
Glucose, Bld: 109 mg/dL — ABNORMAL HIGH (ref 70–99)
Potassium: 3.8 mmol/L (ref 3.5–5.1)
Sodium: 139 mmol/L (ref 135–145)

## 2020-06-13 LAB — CBC
HCT: 43.6 % (ref 39.0–52.0)
Hemoglobin: 14.5 g/dL (ref 13.0–17.0)
MCH: 30.1 pg (ref 26.0–34.0)
MCHC: 33.3 g/dL (ref 30.0–36.0)
MCV: 90.6 fL (ref 80.0–100.0)
Platelets: 123 10*3/uL — ABNORMAL LOW (ref 150–400)
RBC: 4.81 MIL/uL (ref 4.22–5.81)
RDW: 13.7 % (ref 11.5–15.5)
WBC: 6.8 10*3/uL (ref 4.0–10.5)
nRBC: 0 % (ref 0.0–0.2)

## 2020-06-13 LAB — TRIGLYCERIDES: Triglycerides: 99 mg/dL (ref <150)

## 2020-06-13 MED ORDER — METOPROLOL SUCCINATE ER 25 MG PO TB24
25.0000 mg | ORAL_TABLET | Freq: Every day | ORAL | Status: DC
  Administered 2020-06-13 – 2020-06-15 (×3): 25 mg via ORAL
  Filled 2020-06-13 (×4): qty 1

## 2020-06-13 MED ORDER — LABETALOL HCL 5 MG/ML IV SOLN
20.0000 mg | INTRAVENOUS | Status: DC | PRN
  Administered 2020-06-13 – 2020-06-14 (×4): 20 mg via INTRAVENOUS
  Administered 2020-06-14: 05:00:00 10 mg via INTRAVENOUS
  Administered 2020-06-15 – 2020-06-16 (×2): 20 mg via INTRAVENOUS
  Filled 2020-06-13 (×7): qty 4

## 2020-06-13 MED ORDER — PANTOPRAZOLE SODIUM 40 MG PO TBEC
40.0000 mg | DELAYED_RELEASE_TABLET | Freq: Every day | ORAL | Status: DC
  Administered 2020-06-13 – 2020-06-15 (×3): 40 mg via ORAL
  Filled 2020-06-13 (×3): qty 1

## 2020-06-13 MED ORDER — PANTOPRAZOLE SODIUM 40 MG PO PACK: 40.0000 mg | PACK | Freq: Every day | ORAL | Status: DC

## 2020-06-13 MED ORDER — METOPROLOL TARTRATE 25 MG PO TABS: 12.5000 mg | ORAL_TABLET | Freq: Two times a day (BID) | ORAL | Status: DC

## 2020-06-13 NOTE — Progress Notes (Addendum)
STROKE TEAM PROGRESS NOTE   INTERVAL HISTORY Patient in bed, neurological Lee stable.  Blood pressure adequately controlled.  Vital signs stable. Therapy evaluations recommend CIR  Patient able to transfer to Floor today. OBJECTIVE Vitals:   06/13/20 0730 06/13/20 0800 06/13/20 0900 06/13/20 0930  BP: (!) 147/97 (!) 171/117    Pulse: 91 89 92 86  Resp: (!) 21 (!) 24 (!) 28 (!) 25  Temp:  98 F (36.7 C)    TempSrc:  Oral    SpO2: 96% 95% 96% 95%  Weight:      Height:        CBC:  Recent Labs  Lab 06/10/20 0606 06/10/20 0647 06/12/20 0241 06/13/20 0603  WBC 6.0   < > 6.7 6.8  NEUTROABS 4.0  --   --   --   HGB 15.9   < > 14.7 14.5  HCT 47.0   < > 43.2 43.6  MCV 89.9   < > 89.4 90.6  PLT 120*   < > 123* 123*   < > = values in this interval not displayed.    Basic Metabolic Panel:  Recent Labs  Lab 06/12/20 0241 06/13/20 0603  NA 142 139  K 3.6 3.8  CL 109 107  CO2 20* 22  GLUCOSE 118* 109*  BUN 14 17  CREATININE 0.91 0.99  CALCIUM 8.0* 8.1*    Lipid Panel:     Component Value Date/Time   CHOL 129 05/27/2020 0943   TRIG 99 06/13/2020 0603   HDL 32 (L) 05/27/2020 0943   CHOLHDL 4.0 05/27/2020 0943   CHOLHDL 5.8 11/20/2007 0425   VLDL 10 11/20/2007 0425   LDLCALC 84 05/27/2020 0943   HgbA1c:  Lab Results  Component Value Date   HGBA1C 6.1 (H) 06/11/2020   Urine Drug Screen:     Component Value Date/Time   LABOPIA NONE DETECTED 06/10/2020 1328   COCAINSCRNUR NONE DETECTED 06/10/2020 1328   LABBENZ NONE DETECTED 06/10/2020 1328   AMPHETMU NONE DETECTED 06/10/2020 1328   THCU NONE DETECTED 06/10/2020 1328   LABBARB NONE DETECTED 06/10/2020 1328    Alcohol Level     Component Value Date/Time   ETH <10 06/10/2020 0606    IMAGING  CT Code Stroke CTA Head W/WO contrast CT Code Stroke CTA Neck W/WO contrast CT Code Stroke Cerebral Perfusion with contrast 06/10/2020 IMPRESSION:  1. Poorly enhancing dolichoectatic Basilar Artery, although the  vessel does appear to remain patent. 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   2. Subsequently, there is confluent abnormal posterior circulation T-max >6s on CTP, with sparing the Left PCA territory on the basis of a fetal PCA origin on that side. No infarct core detected on CTP.  3. No CTA spot sign detected in the acute ventral pontine hemorrhage.  4. Bilateral calcified carotid artery atherosclerosis, 65-70% stenosis at the Left ICA bulb. No hemodynamically significant stenosis on the right. Dolichoectatic ICA siphons.  5. Aortic Atherosclerosis (ICD10-I70.0).   CT HEAD CODE STROKE WO CONTRAST 06/10/2020 IMPRESSION:  1. 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 No mass effect, extra-axial- or intraventricular extension of blood at this time.  2. 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   CT head WO Contrast  06/11/2020 IMPRESSION: Unchanged small focus of hemorrhage in the brainstem.  MRI head WO Contrast  06/10/2020 IMPRESSION:  Negative for acute ischemic infarct  14 mm acute hemorrhage in the ventral pons without underlying mass or change from earlier today.  Numerous areas of chronic microhemorrhage in the brain bilaterally. Correlate with history of poorly controlled hypertension. If no such history is available, consider cerebral amyloid.  Chest Port 1 View 06/10/2020 IMPRESSION:  1. No acute cardiopulmonary abnormality.  2. Chronic thoracic Aortic Endograft.   Transthoracic Echocardiogram  06/10/2020 IMPRESSIONS  1. Left ventricular ejection fraction, by estimation, is 60 to 65%. The  left ventricle has normal function. The left ventricle has no regional  wall motion abnormalities. There is moderate left  ventricular hypertrophy.  Left ventricular diastolic function  could not be evaluated.  2. Right ventricular systolic function is normal. The right ventricular  size is normal. Tricuspid regurgitation signal is inadequate for assessing  PA pressure.  3. Left atrial size was mild to moderately dilated.  4. The mitral valve is normal in structure. Trivial mitral valve  regurgitation.  5. The aortic valve is tricuspid. There is mild calcification of the  aortic valve. There is mild thickening of the aortic valve. Aortic valve  regurgitation is mild. Mild aortic valve stenosis.  6. Aortic dilatation noted. There is mild dilatation of the ascending  aorta, measuring 42 mm.    ECG - atrial fibrillation - ventricular response 91 BPM (See cardiology reading for complete details)   PHYSICAL EXAM   Temp:  [97.5 F (36.4 C)-98.3 F (36.8 C)] 98 F (36.7 C) (12/20 0800) Pulse Rate:  [41-139] 86 (12/20 0930) Resp:  [16-28] 25 (12/20 0930) BP: (114-171)/(75-117) 171/117 (12/20 0800) SpO2:  [94 %-100 %] 95 % (12/20 0930) Pleasant elderly Caucasian male not in distress..  . Afebrile. Head is nontraumatic. Neck is supple without bruit.    Cardiac exam no murmur or gallop. Lungs are clear to auscultation. Distal pulses are well felt. Neurological Exam : Awake alert oriented to time place and person.  Severe dysarthria and can be understood with some difficulty.  Able to name repeat well.  Extraocular movements are full range without nystagmus.  Blinks to threat bilaterally.  Tongue midline.  Motor system exam shows dense right hemiplegia with 0/5 right upper and 2/5 right lower extremity strength.  Normal antigravity movements on the left side.  Sensation appears preserved bilaterally.  Gait not tested. ASSESSMENT/PLAN Mr. Josejuan Hoaglin is a 79 y.o. male with history of  atrial fibrillation (on Eliquis - but missed last evening dose), AAA s/p stent graft in 2006, CAD, shingles x 2, HLD, HTN, sleep  apnea and vertigo presenting with severe Rt sided weakness, facial droop and severe dysarthria. He did not receive IV t-PA due to ICH.  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 No mass effect, extra-axial- or intraventricular extension of blood at this time. 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 Hemodynamically significant bilateral vertebral artery stenoses (bilateral distal V4 and also dominant Left vertebral origin). Bilateral calcified carotid artery atherosclerosis, 65-70% stenosis at the Left ICA bulb.  MRI head ventral pontine acute hemorrhage 14 mm.  Numerous chronic microhemorrhage bilaterally likely hypertensive    CT head repeat - Unchanged small focus of hemorrhage in the brainstem.  2D Echo - EF  60 - 65%. No cardiac source of emboli identified.   Sars Corona Virus 2 - negative  LDL - 84  HgbA1c - 6.1  UDS - neg  VTE prophylaxis - SCDs  Eliquis (apixaban) daily prior to admission, now on No antithrombotic  Ongoing aggressive stroke risk factor management. Given Amyloid and ICH, anticoagulation not recommended  Therapy recommendations: CIR recommended - Rehabilitation MD consult ordered  Disposition:  Pending  Chronic afib  On eliquis PTA  Received Kcentra reversal   Now on no antithrombotics  Given MRI showing numerous chronic microhemorrhages bilaterally, concerning for hypertensive versus cerebral amyloid, likely not to be future anticoagulation candidate  Hypertension  Home BP meds: metoprolol ; amlodipine  Current BP meds:  metoprolol (change to Toprol XL for once per day dosing) and amlodipine (12/19 - now able to take PO meds)  Off of  cleviprex but BP borderline, we adjusted BP meds over the weekend may consider adjusting further Monday . SBP goal < 160 mm Hg . Long-term BP goal normotensive  Hyperlipidemia  Home Lipid lowering medication: none   LDL 84, goal < 70  Hold off statin given current ICH  Consider statin at discharge  Dysphagia  Did not pass swallow  NPO -> 12/18 - SLP Diet Recommendations: Dysphagia 2 (Fine chop) solids;Honey thick liquids   IV fluid  Speech to follow  Other Stroke Risk Factors  Advanced age  Coronary artery disease  Obstructive sleep apnea  AAA stat post stenting in 2006  Other Active Problems, Findings and Recommendations  Code status - Full Code  Aortic Atherosclerosis (ICD10-I70.0)   Hypokalemia - potassium - 3.4->3.3 - supplement ->3.6  Mild thrombocytopenia - platelets [facility] day # 3  Valentina Lucks, MSN, NP-C [facility] [phone] Continue strict control of blood pressure with systolic goal below 160.  Use as needed IV hydralazine and labetalol over the medication as well.  Mobilization out of bed.  Therapy consults.  Transfer to neurology floor bed.  Transfer to inpatient rehab over the next few days when bed available.  No family available at the bedside for discussion.  This patient is critically ill and at significant risk of neurological worsening, death and care requires constant monitoring of vital signs, hemodynamics,respiratory and cardiac monitoring, extensive review of multiple databases, frequent neurological assessment, discussion with family, other specialists and medical decision making of high complexity.I have made any additions or clarifications directly to the above note.This critical care time does not reflect procedure time, or teaching time or supervisory time of PA/NP/Med Resident etc but could involve care discussion time.  I spent 30 minutes of neurocritical care time  in the care of  this patient. Delia Heady, MD   To contact Stroke Continuity provider, please refer to [email or web address]. After hours, contact General Neurology

## 2020-06-13 NOTE — PMR Pre-admission (Signed)
PMR Admission Coordinator Pre-Admission Assessment  Patient: [name] is an 79 y.o., male MRN: [id number] DOB: 10-29-1940 Height: 6\' 2"  (188 cm) Weight: 96.2 kg              Insurance Information HMO:     PPO:      PCP:      IPA:      80/20: yes     OTHER:  PRIMARY: Medicare A & B      Policy#:      Subscriber: patient Benefits:  Phone #: verified online via [employer] on 06/13/20     Name:  Eff. Date: Part A & B effective 11/24/06     Deduct: $1,484      Out of Pocket Max: NA      Life Max: NA  CIR: 100% with Medicare approval      SNF: 100% (days 1-20), 80% (days 21-100) Outpatient: 80%     Co-Pay: 20% Home Health: 100%      Co-Pay:  DME: 80%     Co-Pay: 20% Providers: pt's choice  SECONDARY:       Policy#:       Phone#:   01/24/07:       Phone#:   The Artist Information Summary" for patients in Inpatient Rehabilitation Facilities with attached "Privacy Act Statement-Health Care Records" was provided and verbally reviewed with: Family  Emergency Contact Information Contact Information    Name Relation Home Work Mobile   [place] Spouse [phone]     [facility] Daughter   [phone]     Current Medical History  Patient Admitting Diagnosis: acute inferior pontine ICH  History of Present Illness:  79 y.o. right-handed male with history of atrial fibrillation maintained on Eliquis, AAA status post stent graft 2006, CAD status post stenting, hypertension, hyperlipidemia, OSA.  Independent prior to admission with intermittent use of straight point cane working as a 2007.  Presented 06/10/2020 with acute onset of right side weakness with dysarthria.  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  No mass-effect.  CT angiogram of head and neck bilateral calcified carotid artery atherosclerosis 65 to 70% stenosis of the left ICA bulb.  No hemodynamically significant stenosis on the right.  Suspect severely delayed  basilar artery perfusion on the basis of hemodynamically significant bilateral vertebral artery stenosis.  MRI shows 14 mm acute hemorrhage in the ventral pons without underlying mass or change from prior tracing.  Admission chemistries urine drug screen negative, troponin negative, MRSA PCR screening positive.  Echocardiogram with ejection fraction of 60 to 65% no wall motion abnormalities.  Placed on hypertonic saline 3%.  Eliquis has been discontinued due to ICH.  Cleviprex for blood pressure control.  Dysphagia #2 honey thick liquids.  .  Complete NIHSS TOTAL: 14 Glasgow Coma Scale Score: 15  Past Medical History  Past Medical History:  Diagnosis Date  . Abdominal aortic aneurysm [facility])    status post endoluminal stent graft at [facility] in 2006  . Abdominal aortic aneurysm (HCC) 2006  . Arthritis   . Coronary artery disease   . GERD (gastroesophageal reflux disease)   . History of shingles    TIMES 2  . Hyperlipidemia   . Hypertension   . MRSA (methicillin resistant Staphylococcus aureus) infection   . Numbness    right leg from knee down worse when standing   . PONV (postoperative nausea and vomiting)   . Sleep apnea    pt  scored 5 per Stop Bang tool at PAT visit 10/26/2015; [facility]  . Vertigo     Family History  family history includes Alzheimer's disease in his sister; Cancer in his sister; Heart failure in his maternal grandfather, maternal grandmother, paternal grandfather, and paternal grandmother.  Prior Rehab/Hospitalizations:  Has the patient had prior rehab or hospitalizations prior to admission? yes  Has the patient had major surgery during 100 days prior to admission? No  Current Medications   Current Facility-Administered Medications:  .  0.9 %  sodium chloride infusion, , Intravenous, Continuous, Marvel PlanXu, Jindong, MD, Last Rate: 50 mL/hr at 06/16/20 0035, New Bag at 06/16/20 0035 .  acetaminophen (TYLENOL) tablet 650 mg, 650 mg, Oral, Q4H  PRN, 650 mg at 06/15/20 0557 **OR** acetaminophen (TYLENOL) 160 MG/5ML solution 650 mg, 650 mg, Per Tube, Q4H PRN, 650 mg at 06/12/20 0555 **OR** acetaminophen (TYLENOL) suppository 650 mg, 650 mg, Rectal, Q4H PRN, Caryl PinaLindzen, Eric, MD .  amLODipine (NORVASC) tablet 10 mg, 10 mg, Oral, Daily, Rica Moteollins, Hunter J, MD, 10 mg at 06/16/20 1001 .  Chlorhexidine Gluconate Cloth 2 % PADS 6 each, 6 each, Topical, Daily, Marvel PlanXu, Jindong, MD, 6 each at 06/15/20 [id number] .  insulin aspart (novoLOG) injection 0-15 Units, 0-15 Units, Subcutaneous, Q4H, Collins, Loel DubonnetHunter J, MD, 2 Units at 06/16/20 0502 .  labetalol (NORMODYNE) injection 20 mg, 20 mg, Intravenous, Q2H PRN, Micki RileySethi, Pramod S, MD, 20 mg at 06/16/20 [id number] .  MEDLINE mouth rinse, 15 mL, Mouth Rinse, BID, Marvel PlanXu, Jindong, MD, 15 mL at 06/16/20 1002 .  metoprolol tartrate (LOPRESSOR) tablet 50 mg, 50 mg, Oral, BID, Biby, [name], NP .  pantoprazole (PROTONIX) EC tablet 40 mg, 40 mg, Oral, QHS, Micki RileySethi, Pramod S, MD, 40 mg at 06/15/20 2113 .  pneumococcal 23 valent vaccine (PNEUMOVAX-23) injection 0.5 mL, 0.5 mL, Intramuscular, Prior to discharge, Marvel PlanXu, Jindong, MD .  senna-docusate (Senokot-S) tablet 1 tablet, 1 tablet, Oral, BID, Micki RileySethi, Pramod S, MD, 1 tablet at 06/16/20 1001  Patients Current Diet:  Diet Order            DIET DYS 2 Room service appropriate? Yes; Fluid consistency: Honey Thick  Diet effective now                 Precautions / Restrictions Precautions Precautions: Fall Precaution Comments: R hemiplegia Restrictions Weight Bearing Restrictions: No   Has the patient had 2 or more falls or a fall with injury in the past year?Yes  Prior Activity Level Community (5-7x/wk): driving, working  Prior Functional Level Prior Function Level of Independence: Independent Comments: pt driving, working as a Retail buyerdeisel mechanic, independent with intermittent use of SPC  Self Care: Did the patient need help bathing, dressing, using the toilet or eating?   Independent  Indoor Mobility: Did the patient need assistance with walking from room to room (with or without device)? Independent  Stairs: Did the patient need assistance with internal or external stairs (with or without device)? Independent  Functional Cognition: Did the patient need help planning regular tasks such as shopping or remembering to take medications? Independent  Home Assistive Devices / Equipment Home Assistive Devices/Equipment: Eyeglasses Home Equipment: Walker - 4 wheels,Cane - single point,Shower seat  Prior Device Use: Indicate devices/aids used by the patient prior to current illness, exacerbation or injury? cane  Current Functional Level Cognition  Arousal/Alertness: Awake/alert Overall Cognitive Status: Impaired/Different from baseline Difficult to assess due to: Impaired communication Current Attention Level: Sustained Orientation Level: Oriented  to person,Oriented to place,Oriented to situation Following Commands: Follows one step commands consistently Safety/Judgement: Decreased awareness of safety,Decreased awareness of deficits General Comments: A&Ox4, tearful when discussing careeer, wife, and family due to new deficits. Follows commands with increased time and to the best of his abilities. Pt communication limited by dysarthric speech Attention: Sustained Sustained Attention: Appears intact Memory: Appears intact Awareness: Appears intact Behaviors: Lability    Extremity Assessment (includes Sensation/Coordination)  Upper Extremity Assessment: Defer to OT evaluation RUE Deficits / Details: flaccid RUE RUE Coordination: decreased fine motor,decreased gross motor  Lower Extremity Assessment: RLE deficits/detail RLE Deficits / Details: no active ROM or trace activation in RLE at this time RLE Sensation: decreased light touch RLE Coordination: decreased fine motor    ADLs  Overall ADL's : Needs assistance/impaired Eating/Feeding: NPO Grooming: Set  up,Bed level,Wash/dry face,Supervision/safety Upper Body Bathing: Maximal assistance,Sitting Lower Body Bathing: Total assistance,+2 for physical assistance,+2 for safety/equipment,Sit to/from stand,Bed level Upper Body Dressing : Maximal assistance,Sitting Upper Body Dressing Details (indicate cue type and reason): donning new gown Lower Body Dressing: Total assistance,+2 for safety/equipment,+2 for physical assistance,Sit to/from stand,Bed level Lower Body Dressing Details (indicate cue type and reason): assist for socks; maxA+2 for sit<>stand Toileting- Clothing Manipulation and Hygiene: Total assistance,+2 for physical assistance,+2 for safety/equipment,Sit to/from stand Toileting - Clothing Manipulation Details (indicate cue type and reason): assist to stand with PT/OT while RN assisting with pericare Functional mobility during ADLs: Maximal assistance,+2 for physical assistance,+2 for safety/equipment (sit<>stand at [facility])    Mobility  Overal bed mobility: Needs Assistance Bed Mobility: Supine to Sit,Sit to Supine,Rolling Rolling: Max assist Supine to sit: Max assist,HOB elevated Sit to supine: Max assist General bed mobility comments: max +2 for trunk and LE management, step-wise LE translation into and out of bed, boost up in bed upon return to supine. Increased time and effort.    Transfers  Overall transfer level: Needs assistance Equipment used: 1 person hand held assist Transfer via Lift Equipment: Stedy Transfers: Sit to/from Stand Sit to Stand: Total assist,+2 physical assistance General transfer comment: PT provides R knee block and BUE support, pt only able to come to 50% erect stance with PT support, incomplete knee and hip extension    Ambulation / Gait / Stairs / Wheelchair Mobility  Ambulation/Gait General Gait Details: NT- not placed in chair due to pt transfer orders    Posture / Balance Dynamic Sitting Balance Sitting balance - Comments: minA with RUE  support Balance Overall balance assessment: Needs assistance Sitting-balance support: Single extremity supported,Feet supported Sitting balance-Leahy Scale: Poor Sitting balance - Comments: minA with RUE support Postural control: Right lateral lean Standing balance support: Bilateral upper extremity supported Standing balance-Leahy Scale: Zero Standing balance comment: totalA to achieve 50% erect standing position    Special needs/care consideration Hgb A1c 6.1   Previous Home Environment  Living Arrangements: Spouse/significant other,Children  Lives With: Spouse,Son Available Help at Discharge: Family Type of Home: House Home Layout: One level Home Access: Stairs to enter Entrance Stairs-Rails: None Secretary/administrator of Steps: 2 Bathroom Shower/Tub: Engineer, manufacturing systems: Handicapped height Bathroom Accessibility: Yes How Accessible: Accessible via walker Home Care Services: No (wife receiving HH per pt report)  Discharge Living Setting Plans for Discharge Living Setting: Patient's home Type of Home at Discharge: House Discharge Home Layout: One level Discharge Home Access: Stairs to enter Entrance Stairs-Rails: None Entrance Stairs-Number of Steps: 2 Discharge Bathroom Shower/Tub: Tub/shower unit Discharge Bathroom Toilet: Handicapped height Discharge Bathroom Accessibility: Yes How Accessible:  Accessible via walker Does the patient have any problems obtaining your medications?: Yes (Describe) (pt reported he sometimes has trouble)  Social/Family/Support Systems Anticipated Caregiver: Finnis Colee, wife; Johnny Bridge, daughter; Marlinda Mike, son Anticipated Caregiver's Contact Information: Liborio Nixon: [phone] Ability/Limitations of Caregiver: Liborio Nixon can't physically help but children can physically help Caregiver Availability: 24/7 Discharge Plan Discussed with Primary Caregiver: Yes Is Caregiver In Agreement with Plan?: Yes Does Caregiver/Family have Issues with  Lodging/Transportation while Pt is in Rehab?: No  Goals Patient/Family Goal for Rehab: Min A PT/OT/ST Expected length of stay: 14-21 days Pt/Family Agrees to Admission and willing to participate: Yes Program Orientation Provided & Reviewed with Pt/Caregiver Including Roles  & Responsibilities: Yes  Decrease burden of Care through IP rehab admission: n/a  Possible need for SNF placement upon discharge:if patient does not reach 1 caregiver support level  Patient Condition: This patient's medical and functional status has changed since the consult dated: 06/13/2020 in which the Rehabilitation Physician determined and documented that the patient's condition is appropriate for intensive rehabilitative care in an inpatient rehabilitation facility. See "History of Present Illness" (above) for medical update. Functional changes are: overall max assist. Patient's medical and functional status update has been discussed with the Rehabilitation physician and patient remains appropriate for inpatient rehabilitation. Will admit to inpatient rehab today.  Preadmission Screen Completed By:  Clois Dupes, RN, 06/16/2020 12:01 PM ______________________________________________________________________   Discussed status with Dr. Carlis Abbott on 06/16/2020 at  1200 and received approval for admission today.  Admission Coordinator:  Clois Dupes, time 1200 Date 06/16/2020

## 2020-06-13 NOTE — Telephone Encounter (Signed)
[facility] patient assistance form completed and faxed for eliquis. 

## 2020-06-13 NOTE — Progress Notes (Signed)
Discussed pt with Marchelle Folks, RN. Pt was tolerating pills whole in puree up until yesterday when Elio Forget saw him and documented the recommendation pills crushed though pt still able to swallow pills whole. Will change recommendation to pills whole per tolerance. Also Marchelle Folks felt pt was lethargic yesterday and struggled with mastication of dys 2. If he is still unable to manipulate this texture advised Marchelle Folks to downgrade to puree (dys 1) per her judgement.  [name], MA CCC-SLP  Acute Rehabilitation Services Pager [phone] Office [phone]

## 2020-06-13 NOTE — Progress Notes (Signed)
Inpatient Rehab Admissions Coordinator:  Saw pt at bedside. Daughter, Johnny Bridge also present.  Explained CIR goals and expectations to her. She acknowledged understanding. She verifed that pt's wife, Liborio Nixon, is able to supervise pt but not physically assist pt. Johnny Bridge reported that she and other family members would be available to physically assist pt if needed.  Will continue to follow.   Wolfgang Phoenix, MS, CCC-SLP Admissions Coordinator [phone]

## 2020-06-13 NOTE — Progress Notes (Signed)
Inpatient Rehab Admissions Coordinator:  Received message from pt's wife.  Returned her call and left message with granddaughter, Carmin Muskrat.  Will continue to follow.   Wolfgang Phoenix, MS, CCC-SLP Admissions Coordinator [phone]

## 2020-06-13 NOTE — Consult Note (Signed)
Physical Medicine and Rehabilitation Consult Reason for Consult: Acute onset right side weakness and dysarthria Referring Physician: Dr. Lucia Gaskins   HPI: [name] is a 79 y.o. right-handed male with history of atrial fibrillation maintained on Eliquis, AAA status post stent graft 2006, CAD status post stenting, hypertension, hyperlipidemia, OSA.  Per chart review patient lives with spouse.  1 level home 2 steps to entry.  Independent prior to admission with intermittent use of straight point cane working as a Games developer.  Presented 06/10/2020 with acute onset of right side weakness with dysarthria.  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  No mass-effect.  CT angiogram of head and neck bilateral calcified carotid artery atherosclerosis 65 to 70% stenosis of the left ICA bulb.  No hemodynamically significant stenosis on the right.  Suspect severely delayed basilar artery perfusion on the basis of hemodynamically significant bilateral vertebral artery stenosis.  MRI shows 14 mm acute hemorrhage in the ventral pons without underlying mass or change from prior tracing.  Admission chemistries urine drug screen negative, troponin negative, MRSA PCR screening positive.  Echocardiogram with ejection fraction of 60 to 65% no wall motion abnormalities.  Placed on hypertonic saline 3%.  Eliquis has been discontinued due to ICH.  Cleviprex for blood pressure control.  Dysphagia #2 honey thick liquids.  Therapy evaluations completed with recommendations of physical medicine rehab consult.   Review of Systems  Constitutional: Negative for chills and fever.  HENT: Negative for hearing loss.   Eyes: Negative for blurred vision and double vision.  Respiratory: Negative for cough and shortness of breath.   Cardiovascular: Positive for palpitations and leg swelling. Negative for chest pain.  Gastrointestinal: Positive for constipation. Negative for heartburn, nausea and vomiting.        GERD  Genitourinary: Positive for urgency. Negative for dysuria, flank pain and hematuria.  Musculoskeletal: Positive for joint pain and myalgias.  Skin: Negative for rash.  Neurological: Positive for weakness.       Vertigo  All other systems reviewed and are negative.  Past Medical History:  Diagnosis Date  . Abdominal aortic aneurysm [facility])    status post endoluminal stent graft at [facility] in 2006  . Abdominal aortic aneurysm (HCC) 2006  . Arthritis   . Coronary artery disease   . GERD (gastroesophageal reflux disease)   . History of shingles    TIMES 2  . Hyperlipidemia   . Hypertension   . MRSA (methicillin resistant Staphylococcus aureus) infection   . Numbness    right leg from knee down worse when standing   . PONV (postoperative nausea and vomiting)   . Sleep apnea    pt scored 5 per Stop Bang tool at PAT visit 10/26/2015; results sent to [facility] NP  . Vertigo    Past Surgical History:  Procedure Laterality Date  . ABDOMINAL AORTIC ANEURYSM REPAIR W/ ENDOLUMINAL GRAFT     13 years ago   . ANGIOPLASTY    . branch stenting  06/08/2004   Dr. Lavonne Chick  . CARDIAC CATHETERIZATION  06/08/2004  . CARDIOVASCULAR STRESS TEST  02/14/2009  . I&D of left knee     . LUMBAR LAMINECTOMY/DECOMPRESSION MICRODISCECTOMY N/A 11/02/2015   Procedure: MICROLUMBAR DECOMPRESSION L3-L4, L4-L5, AND L5-S1;  Surgeon: Jene Every, MD;  Location: [facility]S;  Service: Orthopedics;  Laterality: N/A;  . PICC LINE PLACE PERIPHERAL ([facility] HX)    . TRANSTHORACIC ECHOCARDIOGRAM  06/07/2004   Family History  Problem Relation Age of Onset  . Alzheimer's disease Sister   . Heart failure Maternal Grandmother   . Heart failure Maternal Grandfather   . Heart failure Paternal Grandmother   . Heart failure Paternal Grandfather   . Cancer Sister    Social History:  reports that he has never smoked. He has never used smokeless tobacco. He reports that he does not drink alcohol and does not use  drugs. Allergies:  Allergies  Allergen Reactions  . Crestor [Rosuvastatin] Other (See Comments)    Severe pain  . Lipitor [Atorvastatin] Other (See Comments)    Severe pain   Medications Prior to Admission  Medication Sig Dispense Refill  . amLODipine (NORVASC) 10 MG tablet TAKE 1 TABLET(10 MG) BY MOUTH DAILY (Patient taking differently: Take 10 mg by mouth daily.) 90 tablet 3  . metoprolol tartrate (LOPRESSOR) 25 MG tablet TAKE 1 TABLET BY MOUTH DAILY (Patient taking differently: Take 25 mg by mouth daily.) 30 tablet 1  . pantoprazole (PROTONIX) 40 MG tablet TAKE 1 TABLET(40 MG) BY MOUTH DAILY (Patient taking differently: Take 40 mg by mouth daily. TAKE 1 TABLET(40 MG) BY MOUTH DAILY) 90 tablet 3    Home: Home Living Family/patient expects to be discharged to:: Private residence Living Arrangements: Spouse/significant other,Children Available Help at Discharge: Family Type of Home: House Home Access: Stairs to enter Secretary/administrator of Steps: 2 Entrance Stairs-Rails: None Home Layout: One level Bathroom Shower/Tub: Engineer, manufacturing systems: Handicapped height Bathroom Accessibility: Yes Home Equipment: Environmental consultant - 4 wheels,Cane - single point,Shower seat  Lives With: Spouse,Son  Functional History: Prior Function Level of Independence: Independent Comments: pt driving, working as a Retail buyer, independent with intermittent use of SPC Functional Status:  Mobility: Bed Mobility Overal bed mobility: Needs Assistance Bed Mobility: Supine to Sit,Sit to Supine Supine to sit: Max assist,+2 for physical assistance,+2 for safety/equipment Sit to supine: Max assist,+2 for physical assistance,+2 for safety/equipment General bed mobility comments: pt able to assist with moving LLE and reaching with LUE, requires assist for RLE managment, trunk and to significant assist to scoot hips towards EOB Transfers Overall transfer level: Needs assistance Equipment used: 2  person hand held assist Transfers: Sit to/from Stand Sit to Stand: Max assist,+2 physical assistance,+2 safety/equipment General transfer comment: via face to face and with R knee block; boosting/steadying assist throughout with cues/assist to faciliate trunk upright and hip extension; sit<>stand x2 from EOB while RN changing linens and assisting with pericare Ambulation/Gait General Gait Details: pt unable to take steps at this time    ADL: ADL Overall ADL's : Needs assistance/impaired Eating/Feeding: Sitting,Moderate assistance Grooming: Moderate assistance,Sitting Upper Body Bathing: Maximal assistance,Sitting Lower Body Bathing: Total assistance,+2 for physical assistance,+2 for safety/equipment,Sit to/from stand,Bed level Upper Body Dressing : Maximal assistance,Sitting Upper Body Dressing Details (indicate cue type and reason): donning new gown Lower Body Dressing: Total assistance,+2 for safety/equipment,+2 for physical assistance,Sit to/from stand,Bed level Lower Body Dressing Details (indicate cue type and reason): assist for socks; maxA+2 for sit<>stand Toileting- Clothing Manipulation and Hygiene: Total assistance,+2 for physical assistance,+2 for safety/equipment,Sit to/from stand Toileting - Clothing Manipulation Details (indicate cue type and reason): assist to stand with PT/OT while RN assisting with pericare Functional mobility during ADLs: Maximal assistance,+2 for physical assistance,+2 for safety/equipment (sit<>stand)  Cognition: Cognition Overall Cognitive Status: Impaired/Different from baseline Arousal/Alertness: Awake/alert Orientation Level: Oriented X4 Attention: Sustained Sustained Attention: Appears intact Memory: Appears intact Awareness: Appears intact Behaviors: Lability Cognition Arousal/Alertness: Awake/alert,Lethargic Behavior During Therapy: Flat affect Overall Cognitive  Status: Impaired/Different from baseline Area of Impairment:  Attention,Memory,Following commands,Awareness,Problem solving Current Attention Level: Sustained Memory: Decreased short-term memory Following Commands: Follows one step commands with increased time Awareness: Emergent Problem Solving: Slow processing,Decreased initiation,Requires verbal cues,Requires tactile cues General Comments: pt with dysarthric speech, aware of some of his deficits including R side weakness Difficult to assess due to: Impaired communication  Blood pressure (!) 159/88, pulse 78, temperature 98.3 F (36.8 C), temperature source Axillary, resp. rate (!) 25, height 6\' 2"  (1.88 m), weight 96.2 kg, SpO2 97 %. Physical Exam General: Alert and oriented x 2 (not date), No apparent distress HEENT: Head is normocephalic, atraumatic, PERRLA, EOMI, sclera anicteric, oral mucosa pink and moist, dentition intact, ext ear canals clear,  Neck: Supple without JVD or lymphadenopathy Heart: Reg rate and rhythm. No murmurs rubs or gallops Chest: Tachypneic Abdomen: Soft, non-tender, non-distended, bowel sounds positive. Extremities: No clubbing, cyanosis, or edema. Pulses are 2+ Skin: Clean and intact without signs of breakdown Neuro: Patient is alert in no acute distress.  Severely dysarthric but intelligible.  Follows simple commands. R facial droop. RUE 0/5 strength, RLE 2/5 proximally and 0/5 distally. LLE strength intact.  Psych: Pt's affect is appropriate. Pt is cooperative  Results for orders placed or performed during the hospital encounter of 06/10/20 (from the past 24 hour(s))  Glucose, capillary     Status: Abnormal   Collection Time: 06/12/20  7:23 AM  Result Value Ref Range   Glucose-Capillary 135 (H) 70 - 99 mg/dL  Glucose, capillary     Status: Abnormal   Collection Time: 06/12/20 11:16 AM  Result Value Ref Range   Glucose-Capillary 100 (H) 70 - 99 mg/dL  Glucose, capillary     Status: Abnormal   Collection Time: 06/12/20  3:12 PM  Result Value Ref Range    Glucose-Capillary 110 (H) 70 - 99 mg/dL  Glucose, capillary     Status: Abnormal   Collection Time: 06/12/20  7:48 PM  Result Value Ref Range   Glucose-Capillary 104 (H) 70 - 99 mg/dL  Glucose, capillary     Status: Abnormal   Collection Time: 06/12/20 11:38 PM  Result Value Ref Range   Glucose-Capillary 129 (H) 70 - 99 mg/dL  Glucose, capillary     Status: None   Collection Time: 06/13/20  3:52 AM  Result Value Ref Range   Glucose-Capillary 92 70 - 99 mg/dL   DG Swallowing Func-Speech Pathology  Result Date: 06/11/2020 Objective Swallowing Evaluation: Type of Study: Bedside Swallow Evaluation  Patient Details Name: [name] MRN: Earleen Reaper Date of Birth: 1941/02/27 Today's Date: 06/11/2020 Time: SLP Start Time (ACUTE ONLY): 1137 -SLP Stop Time (ACUTE ONLY): 1155 SLP Time Calculation (min) (ACUTE ONLY): 18 min Past Medical History: Past Medical History: Diagnosis Date . Abdominal aortic aneurysm [facility])   status post endoluminal stent graft at [facility] in 2006 . Abdominal aortic aneurysm (HCC) 2006 . Arthritis  . Coronary artery disease  . GERD (gastroesophageal reflux disease)  . History of shingles   TIMES 2 . Hyperlipidemia  . Hypertension  . MRSA (methicillin resistant Staphylococcus aureus) infection  . Numbness   right leg from knee down worse when standing  . PONV (postoperative nausea and vomiting)  . Sleep apnea   pt scored 5 per Stop Bang tool at PAT visit 10/26/2015; results sent to [facility] NP . Vertigo  Past Surgical History: Past Surgical History: Procedure Laterality Date . ABDOMINAL AORTIC ANEURYSM REPAIR W/ ENDOLUMINAL GRAFT    13  years ago  . ANGIOPLASTY   . branch stenting  06/08/2004  Dr. Lavonne Chick . CARDIAC CATHETERIZATION  06/08/2004 . CARDIOVASCULAR STRESS TEST  02/14/2009 . I&D of left knee    . LUMBAR LAMINECTOMY/DECOMPRESSION MICRODISCECTOMY N/A 11/02/2015  Procedure: MICROLUMBAR DECOMPRESSION L3-L4, L4-L5, AND L5-S1;  Surgeon: Jene Every, MD;  Location: [facility];   Service: Orthopedics;  Laterality: N/A; . PICC LINE PLACE PERIPHERAL ([facility] HX)   . TRANSTHORACIC ECHOCARDIOGRAM  06/07/2004 HPI: 79 y.o. male with atrial fibrillation on Eliquis, presenting with an acute anterior midline pontine hemorrhage  Neuro report includes  severe dysarthria, right facial droop and right hemiplegia.  Subjective: Pt awake, alert, pleasant, participative Assessment / Plan / Recommendation CHL IP CLINICAL IMPRESSIONS 06/11/2020 Clinical Impression Pt presents with moderate oropharyngeal dysphagia c/b decreased lingual strength and coordination, premature spillage, delayed swallo initiation, incomplete laryngeal closure and diminished sensation.  These deficits led to aspiration of thin and nectar thick liquid before and during the swallow with variable cough response.  Aspiration of thin liquid occurred at posterior trachea prior to the swallow 2/2 penetration of bolus through interarytenoid space which traveled on the vocal folds to the anterior portion of the trachea.  With nectar thick liquid by cup, initially only transient penetration was observed but as study progressed, pt aspirated a significant amount of bolus prior to the swallow.  By straw, there was penetration of nectar thick liquid to the level of the vocal folds with aspiration during the swallow.  Cough response was inconsistent and significantly delayed.  Cough was ineffective to clear aspiration and penetration whether cued or reflexive.  There was no penetration or aspiration of honey thick liquid or solids.  There was prolonged oral phase with puree and solid textuers which increased with complexity of solid. With pill simulation there was prompt oral transit of tablet with honey thick liquid and no penetration or aspiraiton.  Esophageal transit of tablet appears to be [facility] as it could not be located on esphageal sweep.   Recommend chopped/ground diet with honey thick liquids. SLP Visit Diagnosis Dysphagia, oropharyngeal phase  (R13.12) Attention and concentration deficit following -- Frontal lobe and executive function deficit following -- Impact on safety and function Moderate aspiration risk   CHL IP TREATMENT RECOMMENDATION 06/11/2020 Treatment Recommendations Therapy as outlined in treatment plan below   Prognosis 06/11/2020 Prognosis for Safe Diet Advancement Good Barriers to Reach Goals -- Barriers/Prognosis Comment -- CHL IP DIET RECOMMENDATION 06/11/2020 SLP Diet Recommendations Dysphagia 2 (Fine chop) solids;Honey thick liquids Liquid Administration via Cup;No straw Medication Administration Whole meds with liquid Compensations Slow rate;Small sips/bites;Follow solids with liquid;Lingual sweep for clearance of pocketing Postural Changes Seated upright at 90 degrees   CHL IP OTHER RECOMMENDATIONS 06/11/2020 Recommended Consults -- Oral Care Recommendations Oral care BID Other Recommendations --   CHL IP FOLLOW UP RECOMMENDATIONS 06/11/2020 Follow up Recommendations Inpatient Rehab   CHL IP FREQUENCY AND DURATION 06/11/2020 Speech Therapy Frequency (ACUTE ONLY) min 2x/week Treatment Duration 2 weeks      CHL IP ORAL PHASE 06/11/2020 Oral Phase Impaired Oral - Pudding Teaspoon -- Oral - Pudding Cup -- Oral - Honey Teaspoon -- Oral - Honey Cup Premature spillage Oral - Nectar Teaspoon -- Oral - Nectar Cup Premature spillage Oral - Nectar Straw Premature spillage Oral - Thin Teaspoon -- Oral - Thin Cup Premature spillage Oral - Thin Straw -- Oral - Puree Decreased bolus cohesion;Premature spillage Oral - Mech Soft Lingual/palatal residue;Lingual pumping Oral - Regular Decreased bolus cohesion;Premature spillage;Piecemeal  swallowing;Lingual/palatal residue;Lingual pumping;Reduced posterior propulsion Oral - Multi-Consistency -- Oral - Pill WFL Oral Phase - Comment --  CHL IP PHARYNGEAL PHASE 06/11/2020 Pharyngeal Phase Impaired Pharyngeal- Pudding Teaspoon -- Pharyngeal -- Pharyngeal- Pudding Cup -- Pharyngeal -- Pharyngeal- Honey  Teaspoon -- Pharyngeal -- Pharyngeal- Honey Cup WFL Pharyngeal -- Pharyngeal- Nectar Teaspoon -- Pharyngeal -- Pharyngeal- Nectar Cup Delayed swallow initiation-pyriform sinuses;Reduced airway/laryngeal closure;Penetration/Aspiration before swallow;Penetration/Aspiration during swallow;Moderate aspiration Pharyngeal Material enters airway, passes BELOW cords without attempt by patient to eject out (silent aspiration) Pharyngeal- Nectar Straw Delayed swallow initiation-pyriform sinuses;Reduced airway/laryngeal closure;Penetration/Aspiration before swallow;Penetration/Aspiration during swallow Pharyngeal Material enters airway, passes BELOW cords and not ejected out despite cough attempt by patient Pharyngeal- Thin Teaspoon -- Pharyngeal -- Pharyngeal- Thin Cup Delayed swallow initiation-pyriform sinuses;Penetration/Aspiration before swallow Pharyngeal Material enters airway, passes BELOW cords and not ejected out despite cough attempt by patient Pharyngeal- Thin Straw -- Pharyngeal -- Pharyngeal- Puree WFL Pharyngeal Material does not enter airway Pharyngeal- Mechanical Soft WFL Pharyngeal Material does not enter airway Pharyngeal- Regular WFL Pharyngeal Material does not enter airway Pharyngeal- Multi-consistency -- Pharyngeal -- Pharyngeal- Pill Delayed swallow initiation-vallecula Pharyngeal Material does not enter airway Pharyngeal Comment --  CHL IP CERVICAL ESOPHAGEAL PHASE 06/11/2020 Cervical Esophageal Phase WFL Pudding Teaspoon -- Pudding Cup -- Honey Teaspoon -- Honey Cup -- Nectar Teaspoon -- Nectar Cup -- Nectar Straw -- Thin Teaspoon -- Thin Cup -- Thin Straw -- Puree -- Mechanical Soft -- Regular -- Multi-consistency -- Pill -- Cervical Esophageal Comment -- Kerrie PleasureLeigh E Borum, MA, CCC-SLP Acute Rehabilitation Services Office: [phone] 06/11/2020, 1:02 PM                Assessment/Plan: Diagnosis: acute inferior pontine ICH 1. Does the need for close, 24 hr/day medical supervision in concert with  the patient's rehab needs make it unreasonable for this patient to be served in a less intensive setting? Yes 2. Co-Morbidities requiring supervision/potential complications: chronic afib, HTN, HLD, dysphagia, CAD, OSA, AAA s/p stenting in 2006, overweight (BMI 27.23) 3. Due to bladder management, bowel management, safety, skin/wound care, disease management, medication administration, pain management and patient education, does the patient require 24 hr/day rehab nursing? Yes 4. Does the patient require coordinated care of a physician, rehab nurse, therapy disciplines of PT, OT, SLP to address physical and functional deficits in the context of the above medical diagnosis(es)? Yes Addressing deficits in the following areas: balance, endurance, locomotion, strength, transferring, bowel/bladder control, bathing, dressing, feeding, grooming, toileting, cognition, speech, swallowing and psychosocial support 5. Can the patient actively participate in an intensive therapy program of at least 3 hrs of therapy per day at least 5 days per week? Yes 6. The potential for patient to make measurable gains while on inpatient rehab is excellent 7. Anticipated functional outcomes upon discharge from inpatient rehab are min assist  with PT, min assist with OT, min assist with SLP. 8. Estimated rehab length of stay to reach the above functional goals is: 2-3 weeks 9. Anticipated discharge destination: Home 10. Overall Rehab/Functional Prognosis: excellent  RECOMMENDATIONS: This patient's condition is appropriate for continued rehabilitative care in the following setting: CIR Patient has agreed to participate in recommended program. Yes Note that insurance prior authorization may be required for reimbursement for recommended care.  Comment:  1) Acute inferior parietal ICH: continue neurology monitoring, plan for transfer to floor today. 2) Left wrist weakness: consider wrist splint. 3) Left dorsiflexion weakness:  consider AFO 4) Impaired mobility and ADLs: admit to CIR once family support can  be confirmed, patient is medically stable, and bed is available.   Thank you for this consult. Admission coordinator to follow.   I have personally performed a face to face diagnostic evaluation, including, but not limited to relevant history and physical exam findings, of this patient and developed relevant assessment and plan.  Additionally, I have reviewed and concur with the physician assistant's documentation above.  Sula Soda, MD  Mcarthur Rossetti Angiulli, PA-C 06/13/2020

## 2020-06-13 NOTE — Progress Notes (Signed)
Inpatient Rehab Admissions Coordinator:  Pt's wife, Liborio Nixon returned call.  Explained CIR goals and expectations to her. She acknowledged understanding. She is interested in pt pursuing CIR.  She verified that she will be able to provide supervision for pt and has family that can provide physical assistance for pt.  Will continue to follow.   Wolfgang Phoenix, MS, CCC-SLP Admissions Coordinator [phone]

## 2020-06-14 LAB — GLUCOSE, CAPILLARY
Glucose-Capillary: 117 mg/dL — ABNORMAL HIGH (ref 70–99)
Glucose-Capillary: 95 mg/dL (ref 70–99)
Glucose-Capillary: 124 mg/dL — ABNORMAL HIGH (ref 70–99)
Glucose-Capillary: 124 mg/dL — ABNORMAL HIGH (ref 70–99)
Glucose-Capillary: 149 mg/dL — ABNORMAL HIGH (ref 70–99)

## 2020-06-14 LAB — CBC
HCT: 45.8 % (ref 39.0–52.0)
Hemoglobin: 15.1 g/dL (ref 13.0–17.0)
MCH: 30.4 pg (ref 26.0–34.0)
MCHC: 33 g/dL (ref 30.0–36.0)
MCV: 92.3 fL (ref 80.0–100.0)
Platelets: 140 10*3/uL — ABNORMAL LOW (ref 150–400)
RBC: 4.96 MIL/uL (ref 4.22–5.81)
RDW: 13.9 % (ref 11.5–15.5)
WBC: 7.3 10*3/uL (ref 4.0–10.5)
nRBC: 0 % (ref 0.0–0.2)

## 2020-06-14 LAB — BASIC METABOLIC PANEL
Anion gap: 9 (ref 5–15)
BUN: 16 mg/dL (ref 8–23)
CO2: 26 mmol/L (ref 22–32)
Calcium: 8.8 mg/dL — ABNORMAL LOW (ref 8.9–10.3)
Chloride: 106 mmol/L (ref 98–111)
Creatinine, Ser: 1.02 mg/dL (ref 0.61–1.24)
GFR, Estimated: >60 mL/min (ref >60)
Glucose, Bld: 118 mg/dL — ABNORMAL HIGH (ref 70–99)
Potassium: 4.1 mmol/L (ref 3.5–5.1)
Sodium: 141 mmol/L (ref 135–145)

## 2020-06-14 NOTE — Progress Notes (Signed)
Inpatient Rehabilitation Admissions Coordinator  I met with patient and his wife at bedside to discuss his progress. I await bed availability for a possible Cir admit this week.  Danne Baxter, RN, MSN Rehab Admissions Coordinator [phone] 06/14/2020 4:35 PM

## 2020-06-14 NOTE — Progress Notes (Signed)
Occupational Therapy Treatment Patient Details Name: [name] MRN: [id number] DOB: 1941/02/27 Today's Date: 06/14/2020    History of present illness The pt is a 79 yo male presenting with sudden onset R-sided weakness and dysarthria. 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 PMH includes: afib on Eliquis, AAA s/p stent graft in 2006, CAD s/p stent to LAD in 2005, GERD, HTN, HLD, vertigo, and OSA.   OT comments  Pt with gradual progress towards OT goals. He tolerated EOB activity and sit<>Stand trials via Stedy. Pt tolerating multiple sit<>stands in stedy; he continues to require two person assist for safe completion of mobility tasks given significant R side weakness. Pt intermittently tearful during session completion given current status, but overall remains very motivated to progress and work with therapies. VSS throughout. He will benefit from continued acute OT services and recommend continued therapy services at CIR level after discharge. Will follow.   Follow Up Recommendations  CIR    Equipment Recommendations  3 in 1 bedside commode;Other (comment) (to be further assessed in next venue)          Precautions / Restrictions Precautions Precautions: Fall Precaution Comments: R hemiplegia Restrictions Weight Bearing Restrictions: No       Mobility Bed Mobility Overal bed mobility: Needs Assistance Bed Mobility: Supine to Sit;Sit to Supine     Supine to sit: Max assist;+2 for physical assistance;+2 for safety/equipment Sit to supine: Max assist;+2 for physical assistance;+2 for safety/equipment   General bed mobility comments: max +2 for trunk and LE management, step-wise LE translation into and out of bed, boost up in bed upon return to supine. Increased time and effort.  Transfers Overall transfer level: Needs assistance Equipment used: Ambulation equipment used Transfers: Sit to/from Stand Sit to Stand: Max assist;+2 physical assistance;+2  safety/equipment         General transfer comment: Max +2 for power up, rise, hip extension via posterior facilitation, steadying, placement of RUE on stedy bar. x4 sit to stands from stedy seat, with mulitmodal cuing for upright chest, hip extension to reach full standing. Standing tolerance ~15 seconds before fatiguing. deferred OOB to recliner as pt tranferring floors shortly after session    Balance Overall balance assessment: Needs assistance Sitting-balance support: Feet supported Sitting balance-Leahy Scale: Poor Sitting balance - Comments: at least minA and up to maxA for posterior and R truncal support Postural control: Right lateral lean Standing balance support: Single extremity supported;Bilateral upper extremity supported Standing balance-Leahy Scale: Zero Standing balance comment: maxA+2                           ADL either performed or assessed with clinical judgement   ADL Overall ADL's : Needs assistance/impaired Eating/Feeding: NPO   Grooming: Set up;Bed level;Wash/dry face;Supervision/safety                               Functional mobility during ADLs: Maximal assistance;+2 for physical assistance;+2 for safety/equipment (sit<>stand at [facility])                         Cognition Arousal/Alertness: Awake/alert;Lethargic Behavior During Therapy: Flat affect Overall Cognitive Status: Difficult to assess Area of Impairment: Attention;Memory;Following commands;Awareness;Problem solving                   Current Attention Level: Sustained Memory: Decreased short-term memory Following Commands: Follows one step  commands with increased time   Awareness: Emergent Problem Solving: Slow processing;Decreased initiation;Requires verbal cues;Requires tactile cues General Comments: A&Ox4, tearful when discussing careeer, wife, and family due to new deficits. Follows commands with increased time and to the best of his abilities. Pt  communication limited by dysarthric speech        Exercises Exercises: General Upper Extremity General Exercises - Upper Extremity Shoulder Horizontal ABduction: PROM;Right;5 reps Shoulder Horizontal ADduction: PROM;Right;5 reps Elbow Flexion: PROM;Right;5 reps Elbow Extension: PROM;Right;5 reps Wrist Flexion: PROM;Right;5 reps Wrist Extension: PROM;Right;5 reps Digit Composite Flexion: PROM;Right;5 reps Composite Extension: PROM;Right;5 reps General Exercises - Lower Extremity Quad Sets: PROM;Right;5 reps;Supine Heel Slides: PROM;Right;5 reps;Supine Hip ABduction/ADduction: PROM;Right;5 reps;Supine   Shoulder Instructions       General Comments VSS on RA    Pertinent Vitals/ Pain       Pain Assessment: Faces Faces Pain Scale: Hurts a little bit Pain Location: RUE, during ROM (elbow especially) Pain Descriptors / Indicators: Discomfort;Grimacing Pain Intervention(s): Monitored during session;Limited activity within patient's tolerance  Home Living                                          Prior Functioning/Environment              Frequency  Min 2X/week        Progress Toward Goals  OT Goals(current goals can now be found in the care plan section)  Progress towards OT goals: Progressing toward goals  Acute Rehab OT Goals Patient Stated Goal: per spouse to increase his independence OT Goal Formulation: With patient/family Time For Goal Achievement: 06/25/20 Potential to Achieve Goals: Good ADL Goals Pt Will Perform Grooming: with min assist;sitting Pt Will Perform Upper Body Bathing: with min assist;sitting Pt Will Perform Upper Body Dressing: with min assist;sitting Pt Will Perform Lower Body Dressing: with mod assist;sit to/from stand Pt Will Transfer to Toilet: with mod assist;stand pivot transfer Pt Will Perform Toileting - Clothing Manipulation and hygiene: with mod assist;sit to/from stand Pt/caregiver will Perform Home Exercise  Program: Increased ROM;Increased strength;Right Upper extremity;With written HEP provided;With minimal assist Additional ADL Goal #1: Pt will maintain static sitting balance EOB >5 min with minguard assist as precursor to ADL. Additional ADL Goal #2: Pt will participate in further visual assessment.  Plan Discharge plan remains appropriate    Co-evaluation    PT/OT/SLP Co-Evaluation/Treatment: Yes Reason for Co-Treatment: Complexity of the patient's impairments (multi-system involvement);For patient/therapist safety;To address functional/ADL transfers PT goals addressed during session: Mobility/safety with mobility;Strengthening/ROM;Balance OT goals addressed during session: ADL's and self-care      AM-PAC OT "6 Clicks" Daily Activity     Outcome Measure   Help from another person eating meals?: A Lot Help from another person taking care of personal grooming?: A Lot Help from another person toileting, which includes using toliet, bedpan, or urinal?: Total Help from another person bathing (including washing, rinsing, drying)?: A Lot Help from another person to put on and taking off regular upper body clothing?: A Lot Help from another person to put on and taking off regular lower body clothing?: Total 6 Click Score: 10    End of Session    OT Visit Diagnosis: Other abnormalities of gait and mobility (R26.89);Hemiplegia and hemiparesis;Cognitive communication deficit (R41.841) Symptoms and signs involving cognitive functions: Nontraumatic intracerebral hemorrhage Hemiplegia - Right/Left: Right Hemiplegia - dominant/non-dominant: Dominant Hemiplegia - caused by: Nontraumatic  intracerebral hemorrhage   Activity Tolerance Patient tolerated treatment well   Patient Left in bed;with call bell/phone within reach;with bed alarm set;with family/visitor present   Nurse Communication Mobility status        Time: 1848-5927 OT Time Calculation (min): 35 min  Charges: OT General  Charges $OT Visit: 1 Visit OT Treatments $Therapeutic Activity: 8-22 mins  Marcy Siren, OT Acute Rehabilitation Services Pager [phone] Office [phone]    Orlando Penner 06/14/2020, 1:17 PM

## 2020-06-14 NOTE — Progress Notes (Signed)
  Speech Language Pathology Treatment: Dysphagia;Cognitive-Linquistic  Patient Details Name: [name] MRN: [id number] DOB: 1941-04-15 Today's Date: 06/14/2020 Time: 6440-3474 SLP Time Calculation (min) (ACUTE ONLY): 20 min  Assessment / Plan / Recommendation Clinical Impression  Today is pt's birthday and goals addressed included dysphagia and dysarthria. Mild right labial residue with honey thick and signs of possible decreased airway protection during swallow or after from residue as seen during MBS. He coughed after 3 trials honey thick with a delay of approximately 20 seconds. Suctioning obtained puree residue not seen in oral cavity. He masticated small piece of cracker with observation of residue but potential due to right weakness and likely decreased sensation. He stated he did not have significant difficulty masticating ground texture and did not feel as if he needed to downgrade to puree. Therapist will continue to monitor.  Strategies introduced to improve speech intelligibility emphasizing over articulation and increased volume. Pt imitated consonants in isolation and with vowels such as /j/, /t/ that were noted to be more difficult with mild approximation. Respiratory support suboptimal and he could not produce volitional cough. Expiratory muscle strength training may be considered to facilitate swallow.    HPI HPI: 79 y.o. male with atrial fibrillation on Eliquis, presenting with an acute anterior midline pontine hemorrhage  Neuro report includes  severe dysarthria, right facial droop and right hemiplegia.      SLP Plan  Continue with current plan of care       Recommendations  Diet recommendations: Dysphagia 2 (fine chop);Honey-thick liquid Liquids provided via: Cup Medication Administration: Crushed with puree Supervision: Staff to assist with self feeding;Full supervision/cueing for compensatory strategies Compensations: Slow rate;Small sips/bites;Follow solids with  liquid;Lingual sweep for clearance of pocketing Postural Changes and/or Swallow Maneuvers: Seated upright 90 degrees                Oral Care Recommendations: Oral care BID Follow up Recommendations: Inpatient Rehab SLP Visit Diagnosis: Dysphagia, unspecified (R13.10);Dysarthria and anarthria (R47.1) Plan: Continue with current plan of care       GO                [name] 06/14/2020, 3:03 PM   [name].Ed Nurse, children's [phone] Office [phone]

## 2020-06-14 NOTE — Progress Notes (Addendum)
STROKE TEAM PROGRESS NOTE   INTERVAL HISTORY Patient transferred to floor. Wife at bedside. No neuro changes.  Blood pressure adequately controlled.  Vital signs stable. Therapy evaluations recommend CIR but no bed available today Neurological exam is unchanged.  Blood pressure adequately controlled. OBJECTIVE Vitals:   06/14/20 0700 06/14/20 0800 06/14/20 0832 06/14/20 0900  BP: (!) 142/89 (!) 165/88 (!) 146/87 (!) 135/96  Pulse: 70 76 80 70  Resp: (!) 24 18 (!) 22 (!) 25  Temp:  98.7 F (37.1 C)    TempSrc:  Axillary    SpO2: 92% 97% 92% 96%  Weight:      Height:        CBC:  Recent Labs  Lab 06/10/20 0606 06/10/20 0647 06/13/20 0603 06/14/20 0247  WBC 6.0   < > 6.8 7.3  NEUTROABS 4.0  --   --   --   HGB 15.9   < > 14.5 15.1  HCT 47.0   < > 43.6 45.8  MCV 89.9   < > 90.6 92.3  PLT 120*   < > 123* 140*   < > = values in this interval not displayed.    Basic Metabolic Panel:  Recent Labs  Lab 06/13/20 0603 06/14/20 0247  NA 139 141  K 3.8 4.1  CL 107 106  CO2 22 26  GLUCOSE 109* 118*  BUN 17 16  CREATININE 0.99 1.02  CALCIUM 8.1* 8.8*    Lipid Panel:     Component Value Date/Time   CHOL 129 05/27/2020 0943   TRIG 99 06/13/2020 0603   HDL 32 (L) 05/27/2020 0943   CHOLHDL 4.0 05/27/2020 0943   CHOLHDL 5.8 11/20/2007 0425   VLDL 10 11/20/2007 0425   LDLCALC 84 05/27/2020 0943   HgbA1c:  Lab Results  Component Value Date   HGBA1C 6.1 (H) 06/11/2020   Urine Drug Screen:     Component Value Date/Time   LABOPIA NONE DETECTED 06/10/2020 1328   COCAINSCRNUR NONE DETECTED 06/10/2020 1328   LABBENZ NONE DETECTED 06/10/2020 1328   AMPHETMU NONE DETECTED 06/10/2020 1328   THCU NONE DETECTED 06/10/2020 1328   LABBARB NONE DETECTED 06/10/2020 1328    Alcohol Level     Component Value Date/Time   ETH <10 06/10/2020 0606    IMAGING  CT Code Stroke CTA Head W/WO contrast CT Code Stroke CTA Neck W/WO contrast CT Code Stroke Cerebral Perfusion with  contrast 06/10/2020 IMPRESSION:  1. Poorly enhancing dolichoectatic Basilar Artery, although the vessel does appear to remain patent. 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   2. Subsequently, there is confluent abnormal posterior circulation T-max >6s on CTP, with sparing the Left PCA territory on the basis of a fetal PCA origin on that side. No infarct core detected on CTP.  3. No CTA spot sign detected in the acute ventral pontine hemorrhage.  4. Bilateral calcified carotid artery atherosclerosis, 65-70% stenosis at the Left ICA bulb. No hemodynamically significant stenosis on the right. Dolichoectatic ICA siphons.  5. Aortic Atherosclerosis (ICD10-I70.0).   CT HEAD CODE STROKE WO CONTRAST 06/10/2020 IMPRESSION:  1. 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 No mass effect, extra-axial- or intraventricular extension of blood at this time.  2. 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   CT head WO Contrast  06/11/2020 IMPRESSION: Unchanged small  focus of hemorrhage in the brainstem.  MRI head WO Contrast 06/10/2020 IMPRESSION:  Negative for acute ischemic infarct  14 mm acute hemorrhage in the ventral pons without underlying mass or change from earlier today.  Numerous areas of chronic microhemorrhage in the brain bilaterally. Correlate with history of poorly controlled hypertension. If no such history is available, consider cerebral amyloid.  Chest Port 1 View 06/10/2020 IMPRESSION:  1. No acute cardiopulmonary abnormality.  2. Chronic thoracic Aortic Endograft.   Transthoracic Echocardiogram  06/10/2020 IMPRESSIONS  1. Left ventricular ejection fraction, by estimation, is 60 to 65%. The  left ventricle has normal  function. The left ventricle has no regional  wall motion abnormalities. There is moderate left ventricular hypertrophy.  Left ventricular diastolic function  could not be evaluated.  2. Right ventricular systolic function is normal. The right ventricular  size is normal. Tricuspid regurgitation signal is inadequate for assessing  PA pressure.  3. Left atrial size was mild to moderately dilated.  4. The mitral valve is normal in structure. Trivial mitral valve  regurgitation.  5. The aortic valve is tricuspid. There is mild calcification of the  aortic valve. There is mild thickening of the aortic valve. Aortic valve  regurgitation is mild. Mild aortic valve stenosis.  6. Aortic dilatation noted. There is mild dilatation of the ascending  aorta, measuring 42 mm.    ECG - atrial fibrillation - ventricular response 91 BPM (See cardiology reading for complete details)   PHYSICAL EXAM   Temp:  [97.9 F (36.6 C)-98.8 F (37.1 C)] 98.7 F (37.1 C) (12/21 0800) Pulse Rate:  [42-115] 70 (12/21 0900) Resp:  [16-27] 25 (12/21 0900) BP: (128-173)/(73-111) 135/96 (12/21 0900) SpO2:  [92 %-99 %] 96 % (12/21 0900) Pleasant elderly Caucasian male not in distress..  . Afebrile. Head is nontraumatic. Neck is supple without bruit.    Cardiac exam no murmur or gallop. Lungs are clear to auscultation. Distal pulses are well felt. Neurological Exam : Awake alert oriented to time place and person.  Severe dysarthria and can be understood with some difficulty.  Able to name repeat well.  Extraocular movements are full range without nystagmus.  Blinks to threat bilaterally.  Tongue midline.  Motor system exam shows dense right hemiplegia with 0/5 right upper and 2/5 right lower extremity strength.  Normal antigravity movements on the left side.  Sensation appears preserved bilaterally.  Gait not tested. ASSESSMENT/PLAN [name] is a 79 y.o. male with history of  atrial fibrillation (on Eliquis  - but missed last evening dose), AAA s/p stent graft in 2006, CAD, shingles x 2, HLD, HTN, sleep apnea and vertigo presenting with severe Rt sided weakness, facial droop and severe dysarthria. He did not receive IV t-PA due to ICH.  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 No mass effect, extra-axial- or intraventricular extension of blood at this time. 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 Hemodynamically significant bilateral vertebral artery stenoses (bilateral distal V4 and also dominant Left vertebral origin). Bilateral calcified carotid artery atherosclerosis, 65-70% stenosis at the Left ICA bulb.  MRI head ventral pontine acute hemorrhage 14 mm.  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  2D Echo - EF 60 - 65%. No cardiac source of emboli identified.   Sars Corona Virus 2 - negative  LDL - 84  HgbA1c - 6.1  UDS - neg  VTE prophylaxis - SCDs  Eliquis (apixaban) daily prior to admission, now on No antithrombotic  Ongoing aggressive stroke risk factor management. Given Amyloid and ICH, anticoagulation not recommended  Therapy recommendations: CIR recommended - Rehabilitation MD consult ordered  Disposition:  Pending  Chronic afib  On eliquis PTA  Received Kcentra reversal   Now on no antithrombotics  Given MRI showing numerous chronic microhemorrhages bilaterally, concerning for hypertensive versus cerebral amyloid, likely not to be future anticoagulation candidate  Hypertension  Home BP meds: metoprolol ; amlodipine  Current BP meds:  metoprolol (change  to Toprol XL for once per day dosing) and amlodipine (12/19 - now able to take PO meds)  Off of cleviprex but BP borderline, we adjusted BP meds over the weekend may consider adjusting further Monday . SBP goal < 160 mm Hg . Long-term BP goal normotensive  Hyperlipidemia  Home Lipid lowering medication: none   LDL 84, goal < 70  Hold off statin given current ICH  Consider statin at discharge  Dysphagia  Did not pass swallow  NPO -> 12/18 - SLP Diet Recommendations: Dysphagia 2 (Fine chop) solids;Honey thick liquids   IV fluid  Speech to follow  Other Stroke Risk Factors  Advanced age  Coronary artery disease  Obstructive sleep apnea  AAA stat post stenting in 2006  Other Active Problems, Findings and Recommendations  Code status - Full Code  Aortic Atherosclerosis (ICD10-I70.0)   Hypokalemia - potassium - 3.4->3.3 - supplement ->3.6  Mild thrombocytopenia - platelets [facility] day # 4  Valentina Lucks, MSN, NP-C [facility] [phone] Continue mobilization out of bed and ongoing therapy consults.  Patient medically stable.  Transfer to inpatient rehab for the next few days when bed is available.  Discussed with patient and wife and answered questions.  Greater than 50% time during this 25-minute visit was spent on counseling and coordination of care about his hemorrhage and discussion with care team impression. Delia Heady, MD To contact Stroke Continuity provider, please refer to [email or web address]. After hours, contact General Neurology

## 2020-06-14 NOTE — Progress Notes (Signed)
Patient with transfer orders to 3W.  Report given to [facility]. Patient transferred to unit at this time without complications. Wife at bedside and brought all patient belongings to 8W.

## 2020-06-14 NOTE — Progress Notes (Signed)
Physical Therapy Treatment Patient Details Name: [name] MRN: [id number] DOB: 08-25-1940 Today's Date: 06/14/2020    History of Present Illness The pt is a 79 yo male presenting with sudden onset R-sided weakness and dysarthria. 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 PMH includes: afib on Eliquis, AAA s/p stent graft in 2006, CAD s/p stent to LAD in 2005, GERD, HTN, HLD, vertigo, and OSA.    PT Comments    Pt resting upon arrival to room, wakes with PT speaking. PT and OT saw pt together to progress mobility and maximize therapeutic effect during session. Pt tolerating multiple transfer attempts today, aided by stedy and multimodal facilitation to reach full upright standing. Pt continuing to require max +2 assist, but tolerance is improving. No AROM appreciated from RLE today, ROM WFL. PT to continue to follow acutely.    Follow Up Recommendations  CIR     Equipment Recommendations   (defer to post acute)    Recommendations for Other Services       Precautions / Restrictions Precautions Precautions: Fall Precaution Comments: R hemiplegia Restrictions Weight Bearing Restrictions: No    Mobility  Bed Mobility Overal bed mobility: Needs Assistance Bed Mobility: Supine to Sit;Sit to Supine     Supine to sit: Max assist;+2 for physical assistance;+2 for safety/equipment Sit to supine: Max assist;+2 for physical assistance;+2 for safety/equipment   General bed mobility comments: max +2 for trunk and LE management, step-wise LE translation into and out of bed, boost up in bed upon return to supine. Increased time and effort.  Transfers Overall transfer level: Needs assistance Equipment used: 2 person hand held assist Transfers: Sit to/from Stand Sit to Stand: Max assist;+2 physical assistance;+2 safety/equipment         General transfer comment: Max +2 for power up, rise, hip extension via posterior facilitation, steadying, placement of RUE on stedy bar. x4  sit to stands from stedy seat, with mulitmodal cuing for upright chest, hip extension to reach full standing. Standing tolerance ~15 seconds before fatiguing.  Ambulation/Gait             General Gait Details: NT- not placed in chair due to pt transfer orders   Stairs             Wheelchair Mobility    Modified Rankin (Stroke Patients Only) Modified Rankin (Stroke Patients Only) Pre-Morbid Rankin Score: No symptoms Modified Rankin: Severe disability     Balance Overall balance assessment: Needs assistance Sitting-balance support: Feet supported Sitting balance-Leahy Scale: Poor Sitting balance - Comments: at least minA and up to maxA for posterior and R truncal support Postural control: Right lateral lean Standing balance support: Single extremity supported;Bilateral upper extremity supported Standing balance-Leahy Scale: Zero Standing balance comment: maxA+2                            Cognition Arousal/Alertness: Awake/alert;Lethargic Behavior During Therapy: Flat affect Overall Cognitive Status: Difficult to assess Area of Impairment: Attention;Memory;Following commands;Awareness;Problem solving                   Current Attention Level: Sustained Memory: Decreased short-term memory Following Commands: Follows one step commands with increased time   Awareness: Emergent Problem Solving: Slow processing;Decreased initiation;Requires verbal cues;Requires tactile cues General Comments: A&Ox4, tearful when discussing careeer, wife, and family due to new deficits. Follows commands with increased time and to the best of his abilities. Pt communication limited by dysarthric speech  Exercises General Exercises - Lower Extremity Quad Sets: PROM;Right;5 reps;Supine Heel Slides: PROM;Right;5 reps;Supine Hip ABduction/ADduction: PROM;Right;5 reps;Supine    General Comments General comments (skin integrity, edema, etc.): VSS      Pertinent  Vitals/Pain Pain Assessment: Faces Faces Pain Scale: Hurts a little bit Pain Location: RUE, during ROM (elbow especially) Pain Descriptors / Indicators: Discomfort;Grimacing Pain Intervention(s): Limited activity within patient's tolerance;Monitored during session    Home Living                      Prior Function            PT Goals (current goals can now be found in the care plan section) Acute Rehab PT Goals Patient Stated Goal: per spouse to increase his independence PT Goal Formulation: With patient/family Time For Goal Achievement: 06/25/20 Potential to Achieve Goals: Fair Progress towards PT goals: Progressing toward goals    Frequency    Min 4X/week      PT Plan Current plan remains appropriate    Co-evaluation PT/OT/SLP Co-Evaluation/Treatment: Yes Reason for Co-Treatment: For patient/therapist safety;To address functional/ADL transfers;Complexity of the patient's impairments (multi-system involvement) PT goals addressed during session: Mobility/safety with mobility;Strengthening/ROM;Balance        AM-PAC PT "6 Clicks" Mobility   Outcome Measure  Help needed turning from your back to your side while in a flat bed without using bedrails?: A Lot Help needed moving from lying on your back to sitting on the side of a flat bed without using bedrails?: A Lot Help needed moving to and from a bed to a chair (including a wheelchair)?: Total Help needed standing up from a chair using your arms (e.g., wheelchair or bedside chair)?: A Lot Help needed to walk in hospital room?: Total Help needed climbing 3-5 steps with a railing? : Total 6 Click Score: 9    End of Session   Activity Tolerance: Patient tolerated treatment well Patient left: in bed;with call bell/phone within reach;with bed alarm set;with nursing/sitter in room;with family/visitor present Nurse Communication: Mobility status PT Visit Diagnosis: Unsteadiness on feet (R26.81);Other abnormalities  of gait and mobility (R26.89);Muscle weakness (generalized) (M62.81);Hemiplegia and hemiparesis Hemiplegia - Right/Left: Right Hemiplegia - dominant/non-dominant: Dominant Hemiplegia - caused by: Nontraumatic intracerebral hemorrhage     Time: 9628-3662 PT Time Calculation (min) (ACUTE ONLY): 33 min  Charges:  $Therapeutic Activity: 8-22 mins                     Marye Round, PT Acute Rehabilitation Services Pager [phone]  Office [phone]   Daurice Ovando E Christain [place] 06/14/2020, 10:01 AM

## 2020-06-14 NOTE — H&P (Signed)
Physical Medicine and Rehabilitation Admission H&P    Chief Complaint  Patient presents with  . Code Stroke  : HPI: [name] is a 79 year old right-handed male with history of atrial fibrillation maintained on Eliquis, AAA status post stent graft 2006, CAD status post stenting, hypertension hyperlipidemia and OSA.  Per chart review lives with spouse 1 level home 2 steps to entry.  Independent prior to admission with intermittent use of a straight point cane working as a Games developer.  Presented 06/10/2020 with acute onset of right side weakness and dysarthria.  Admission chemistries unremarkable except potassium 3.4, glucose 128, hemoglobin A1c 6.1 troponin negative, alcohol/urine drug screen negative.  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  No mass-effect.  CT angiogram of head and neck bilateral calcified carotid artery atherosclerosis 65 to 70% stenosis of the left ICA bulb.  No hemodynamically significant stenosis on the right.  Suspect severely delayed basilar artery perfusion on the basis of hemodynamically significant bilateral vertebral artery stenosis.  MRI showed 14 mm acute hemorrhage in the left ventral pons without underlying mass or change from prior tracing.  Admission chemistries urine drug screen negative troponin negative MRSA PCR screening positive.  Echocardiogram with ejection fraction 60 to 65% no wall motion abnormalities.  Initially placed on hypertonic saline 3%.  Eliquis has been discontinued due to ICH.  Cleviprex initiated for blood pressure control.  Dysphagia #2 honey thick liquids.  Due to patient's acute right side weakness dysarthria he was admitted for a comprehensive rehab program.  Review of Systems  Constitutional: Negative for chills and fever.  HENT: Negative for hearing loss.   Eyes: Negative for blurred vision and double vision.  Respiratory: Negative for cough and shortness of breath.   Cardiovascular: Positive for  palpitations and leg swelling.  Gastrointestinal: Positive for constipation. Negative for heartburn, nausea and vomiting.       GERD  Genitourinary: Positive for urgency. Negative for dysuria, flank pain and hematuria.  Musculoskeletal: Positive for joint pain and myalgias.  Neurological:       Vertigo  All other systems reviewed and are negative.  Past Medical History:  Diagnosis Date  . Abdominal aortic aneurysm [facility])    status post endoluminal stent graft at [facility] in 2006  . Abdominal aortic aneurysm (HCC) 2006  . Arthritis   . Coronary artery disease   . GERD (gastroesophageal reflux disease)   . History of shingles    TIMES 2  . Hyperlipidemia   . Hypertension   . MRSA (methicillin resistant Staphylococcus aureus) infection   . Numbness    right leg from knee down worse when standing   . PONV (postoperative nausea and vomiting)   . Sleep apnea    pt scored 5 per Stop Bang tool at PAT visit 10/26/2015; results sent to [facility] NP  . Vertigo    Past Surgical History:  Procedure Laterality Date  . ABDOMINAL AORTIC ANEURYSM REPAIR W/ ENDOLUMINAL GRAFT     13 years ago   . ANGIOPLASTY    . branch stenting  06/08/2004   Dr. Lavonne Chick  . CARDIAC CATHETERIZATION  06/08/2004  . CARDIOVASCULAR STRESS TEST  02/14/2009  . I&D of left knee     . LUMBAR LAMINECTOMY/DECOMPRESSION MICRODISCECTOMY N/A 11/02/2015   Procedure: MICROLUMBAR DECOMPRESSION L3-L4, L4-L5, AND L5-S1;  Surgeon: Jene Every, MD;  Location: [facility] ORS;  Service: Orthopedics;  Laterality: N/A;  . PICC LINE PLACE PERIPHERAL ([facility] HX)    .  TRANSTHORACIC ECHOCARDIOGRAM  06/07/2004   Family History  Problem Relation Age of Onset  . Alzheimer's disease Sister   . Heart failure Maternal Grandmother   . Heart failure Maternal Grandfather   . Heart failure Paternal Grandmother   . Heart failure Paternal Grandfather   . Cancer Sister    Social History:  reports that he has never smoked. He has never used  smokeless tobacco. He reports that he does not drink alcohol and does not use drugs. Allergies:  Allergies  Allergen Reactions  . Crestor [Rosuvastatin] Other (See Comments)    Severe pain  . Lipitor [Atorvastatin] Other (See Comments)    Severe pain   Medications Prior to Admission  Medication Sig Dispense Refill  . amLODipine (NORVASC) 10 MG tablet TAKE 1 TABLET(10 MG) BY MOUTH DAILY (Patient taking differently: Take 10 mg by mouth daily.) 90 tablet 3  . metoprolol tartrate (LOPRESSOR) 25 MG tablet TAKE 1 TABLET BY MOUTH DAILY (Patient taking differently: Take 25 mg by mouth daily.) 30 tablet 1  . pantoprazole (PROTONIX) 40 MG tablet TAKE 1 TABLET(40 MG) BY MOUTH DAILY (Patient taking differently: Take 40 mg by mouth daily. TAKE 1 TABLET(40 MG) BY MOUTH DAILY) 90 tablet 3    Drug Regimen Review Drug regimen was reviewed and remains appropriate with no significant issues identified  Home: Home Living Family/patient expects to be discharged to:: Private residence Living Arrangements: Spouse/significant other,Children Available Help at Discharge: Family Type of Home: House Home Access: Stairs to enter Secretary/administrator of Steps: 2 Entrance Stairs-Rails: None Home Layout: One level Bathroom Shower/Tub: Engineer, manufacturing systems: Handicapped height Bathroom Accessibility: Yes Home Equipment: Environmental consultant - 4 wheels,Cane - single point,Shower seat  Lives With: Spouse,Son   Functional History: Prior Function Level of Independence: Independent Comments: pt driving, working as a Retail buyer, independent with intermittent use of SPC  Functional Status:  Mobility: Bed Mobility Overal bed mobility: Needs Assistance Bed Mobility: Supine to Sit,Sit to Supine Supine to sit: Max assist,+2 for physical assistance,+2 for safety/equipment Sit to supine: Max assist,+2 for physical assistance,+2 for safety/equipment General bed mobility comments: max +2 for trunk and LE  management, step-wise LE translation into and out of bed, boost up in bed upon return to supine. Increased time and effort. Transfers Overall transfer level: Needs assistance Equipment used: Ambulation equipment used Transfer via Lift Equipment: Stedy Transfers: Sit to/from Stand Sit to Stand: Max assist,+2 physical assistance,+2 safety/equipment General transfer comment: Max +2 for power up, rise, hip extension via posterior facilitation, steadying, placement of RUE on stedy bar. x4 sit to stands from stedy seat, with mulitmodal cuing for upright chest, hip extension to reach full standing. Standing tolerance ~15 seconds before fatiguing. deferred OOB to recliner as pt tranferring floors shortly after session Ambulation/Gait General Gait Details: NT- not placed in chair due to pt transfer orders    ADL: ADL Overall ADL's : Needs assistance/impaired Eating/Feeding: NPO Grooming: Set up,Bed level,Wash/dry face,Supervision/safety Upper Body Bathing: Maximal assistance,Sitting Lower Body Bathing: Total assistance,+2 for physical assistance,+2 for safety/equipment,Sit to/from stand,Bed level Upper Body Dressing : Maximal assistance,Sitting Upper Body Dressing Details (indicate cue type and reason): donning new gown Lower Body Dressing: Total assistance,+2 for safety/equipment,+2 for physical assistance,Sit to/from stand,Bed level Lower Body Dressing Details (indicate cue type and reason): assist for socks; maxA+2 for sit<>stand Toileting- Clothing Manipulation and Hygiene: Total assistance,+2 for physical assistance,+2 for safety/equipment,Sit to/from stand Toileting - Clothing Manipulation Details (indicate cue type and reason): assist to stand with PT/OT while  RN assisting with pericare Functional mobility during ADLs: Maximal assistance,+2 for physical assistance,+2 for safety/equipment ([facility]  Cognition: Cognition Overall Cognitive Status: Difficult to  assess Arousal/Alertness: Awake/alert Orientation Level: Oriented X4 Attention: Sustained Sustained Attention: Appears intact Memory: Appears intact Awareness: Appears intact Behaviors: Lability Cognition Arousal/Alertness: Awake/alert,Lethargic Behavior During Therapy: Flat affect Overall Cognitive Status: Difficult to assess Area of Impairment: Attention,Memory,Following commands,Awareness,Problem solving Current Attention Level: Sustained Memory: Decreased short-term memory Following Commands: Follows one step commands with increased time Awareness: Emergent Problem Solving: Slow processing,Decreased initiation,Requires verbal cues,Requires tactile cues General Comments: A&Ox4, tearful when discussing careeer, wife, and family due to new deficits. Follows commands with increased time and to the best of his abilities. Pt communication limited by dysarthric speech Difficult to assess due to: Impaired communication  Physical Exam: Blood pressure (!) 151/97, pulse 74, temperature 97.9 F (36.6 C), temperature source Oral, resp. rate (!) 25, height 6\' 2"  (1.88 m), weight 96.2 kg, SpO2 97 %. Physical Exam General: Alert, No apparent distress HEENT: Head is normocephalic, atraumatic, PERRLA, EOMI, sclera anicteric, oral mucosa pink and moist, dentition intact, ext ear canals clear,  Neck: Supple without JVD or lymphadenopathy Heart: Reg rate and rhythm. No murmurs rubs or gallops Chest: CTA bilaterally without wheezes, rales, or rhonchi; no distress Abdomen: Soft, non-tender, non-distended, bowel sounds positive. Extremities: No clubbing, cyanosis, or edema. Pulses are 2+ Skin: Clean and intact without signs of breakdown Neuro: Patient is alert makes eye contact with examiner.  Severely dysarthric but intelligible.  Follows commands.  Right facial droop. RUE 0/5, RLE 2/5, left side 4+/5.  Psych: Pt's affect is appropriate. Pt is cooperative   Results for orders placed or performed  during the hospital encounter of 06/10/20 (from the past 48 hour(s))  Glucose, capillary     Status: Abnormal   Collection Time: 06/12/20  3:12 PM  Result Value Ref Range   Glucose-Capillary 110 (H) 70 - 99 mg/dL    Comment: Glucose reference range applies only to samples taken after fasting for at least 8 hours.  Glucose, capillary     Status: Abnormal   Collection Time: 06/12/20  7:48 PM  Result Value Ref Range   Glucose-Capillary 104 (H) 70 - 99 mg/dL    Comment: Glucose reference range applies only to samples taken after fasting for at least 8 hours.  Glucose, capillary     Status: Abnormal   Collection Time: 06/12/20 11:38 PM  Result Value Ref Range   Glucose-Capillary 129 (H) 70 - 99 mg/dL    Comment: Glucose reference range applies only to samples taken after fasting for at least 8 hours.  Glucose, capillary     Status: None   Collection Time: 06/13/20  3:52 AM  Result Value Ref Range   Glucose-Capillary 92 70 - 99 mg/dL    Comment: Glucose reference range applies only to samples taken after fasting for at least 8 hours.  CBC     Status: Abnormal   Collection Time: 06/13/20  6:03 AM  Result Value Ref Range   WBC 6.8 4.0 - 10.5 K/uL   RBC 4.81 4.22 - 5.81 MIL/uL   Hemoglobin 14.5 13.0 - 17.0 g/dL   HCT 16.143.6 09.639.0 - 04.552.0 %   MCV 90.6 80.0 - 100.0 fL   MCH 30.1 26.0 - 34.0 pg   MCHC 33.3 30.0 - 36.0 g/dL   RDW 40.913.7 81.111.5 - 91.415.5 %   Platelets 123 (L) 150 - 400 K/uL    Comment: REPEATED TO  VERIFY   nRBC 0.0 0.0 - 0.2 %    Comment: Performed at [facility], [address], [place], [place] [address]  Basic metabolic panel     Status: Abnormal   Collection Time: 06/13/20  6:03 AM  Result Value Ref Range   Sodium 139 135 - 145 mmol/L   Potassium 3.8 3.5 - 5.1 mmol/L   Chloride 107 98 - 111 mmol/L   CO2 22 22 - 32 mmol/L   Glucose, Bld 109 (H) 70 - 99 mg/dL    Comment: Glucose reference range applies only to samples taken after fasting for at least 8 hours.   BUN 17 8  - 23 mg/dL   Creatinine, Ser 7.12 0.61 - 1.24 mg/dL   Calcium 8.1 (L) 8.9 - 10.3 mg/dL   GFR, Estimated >45 >80 mL/min    Comment: (NOTE) Calculated using the CKD-EPI Creatinine Equation (2021)    Anion gap 10 5 - 15    Comment: Performed at [facility], [address], [place], [place] [address]  Triglycerides     Status: None   Collection Time: 06/13/20  6:03 AM  Result Value Ref Range   Triglycerides 99 <150 mg/dL    Comment: Performed at [facility], [address] [place], [place] [address]  Glucose, capillary     Status: Abnormal   Collection Time: 06/13/20  8:17 AM  Result Value Ref Range   Glucose-Capillary 108 (H) 70 - 99 mg/dL    Comment: Glucose reference range applies only to samples taken after fasting for at least 8 hours.  Glucose, capillary     Status: Abnormal   Collection Time: 06/13/20 11:42 AM  Result Value Ref Range   Glucose-Capillary 130 (H) 70 - 99 mg/dL    Comment: Glucose reference range applies only to samples taken after fasting for at least 8 hours.  Glucose, capillary     Status: Abnormal   Collection Time: 06/13/20  4:34 PM  Result Value Ref Range   Glucose-Capillary 101 (H) 70 - 99 mg/dL    Comment: Glucose reference range applies only to samples taken after fasting for at least 8 hours.  Glucose, capillary     Status: Abnormal   Collection Time: 06/13/20  7:50 PM  Result Value Ref Range   Glucose-Capillary 138 (H) 70 - 99 mg/dL    Comment: Glucose reference range applies only to samples taken after fasting for at least 8 hours.  Glucose, capillary     Status: Abnormal   Collection Time: 06/13/20 11:35 PM  Result Value Ref Range   Glucose-Capillary 106 (H) 70 - 99 mg/dL    Comment: Glucose reference range applies only to samples taken after fasting for at least 8 hours.  CBC     Status: Abnormal   Collection Time: 06/14/20  2:47 AM  Result Value Ref Range   WBC 7.3 4.0 - 10.5 K/uL   RBC 4.96 4.22 - 5.81 MIL/uL   Hemoglobin  15.1 13.0 - 17.0 g/dL   HCT 39.7 67.3 - 41.9 %   MCV 92.3 80.0 - 100.0 fL   MCH 30.4 26.0 - 34.0 pg   MCHC 33.0 30.0 - 36.0 g/dL   RDW 37.9 02.4 - 09.7 %   Platelets 140 (L) 150 - 400 K/uL   nRBC 0.0 0.0 - 0.2 %    Comment: Performed at [facility], [address], [place], [place] [address]  Basic metabolic panel  Status: Abnormal   Collection Time: 06/14/20  2:47 AM  Result Value Ref Range   Sodium 141 135 - 145 mmol/L   Potassium 4.1 3.5 - 5.1 mmol/L   Chloride 106 98 - 111 mmol/L   CO2 26 22 - 32 mmol/L   Glucose, Bld 118 (H) 70 - 99 mg/dL    Comment: Glucose reference range applies only to samples taken after fasting for at least 8 hours.   BUN 16 8 - 23 mg/dL   Creatinine, Ser 4.09 0.61 - 1.24 mg/dL   Calcium 8.8 (L) 8.9 - 10.3 mg/dL   GFR, Estimated >81 >19 mL/min    Comment: (NOTE) Calculated using the CKD-EPI Creatinine Equation (2021)    Anion gap 9 5 - 15    Comment: Performed at [facility], [address], [place], [place] [address]  Glucose, capillary     Status: Abnormal   Collection Time: 06/14/20  3:48 AM  Result Value Ref Range   Glucose-Capillary 117 (H) 70 - 99 mg/dL    Comment: Glucose reference range applies only to samples taken after fasting for at least 8 hours.  Glucose, capillary     Status: None   Collection Time: 06/14/20  7:41 AM  Result Value Ref Range   Glucose-Capillary 95 70 - 99 mg/dL    Comment: Glucose reference range applies only to samples taken after fasting for at least 8 hours.  Glucose, capillary     Status: Abnormal   Collection Time: 06/14/20 12:30 PM  Result Value Ref Range   Glucose-Capillary 124 (H) 70 - 99 mg/dL    Comment: Glucose reference range applies only to samples taken after fasting for at least 8 hours.   Comment 1 Notify RN    Comment 2 Document in Chart    No results found.     Medical Problem List and Plan: 1.  Right side weakness with dysphagia as well as dysarthria secondary to acute  inferior pontine hemorrhage likely due to hypertension in the setting of Eliquis use as well significant bilateral vertebral artery stenosis  -patient may shower  -ELOS/Goals: 14-21 days 2.  Antithrombotics: -DVT/anticoagulation: SCDs  -antiplatelet therapy: N/A 3. Pain Management: Continue Tylenol as needed 4. Mood: Provide emotional support  -antipsychotic agents: N/A 5. Neuropsych: This patient is capable of making decisions on his own behalf. 6. Skin/Wound Care: Routine skin checks 7. Fluids/Electrolytes/Nutrition: Routine INO's with follow-up chemistries 8.  Dysphagia.  Dysphagia #2 honey thick liquids.  Monitor hydration.  Follow-up speech therapy 9.  Atrial fibrillation.  Eliquis discontinued due to ICH.  Cardiac rate controlled.  Continue beta-blocker 10.  Hypertension.  Norvasc 10 mg daily. Currently elevated. Increase Lopressor to  TID. Monitor with increased mobility 11.  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  Contact precautions 12.  Prediabetes.  Hemoglobin A1c 6.1.  SSI. CBGs ranging from 98 to 133. Provide dietary education.  13.  GERD.  Continue Protonix  I have personally performed a face to face diagnostic evaluation, including, but not limited to relevant history and physical exam findings, of this patient and developed relevant assessment and plan.  Additionally, I have reviewed and concur with the physician assistant's documentation above.  Sula Soda, MD  Mcarthur Rossetti Angiulli, PA-C 06/14/2020

## 2020-06-15 DIAGNOSIS — I610 Nontraumatic intracerebral hemorrhage in hemisphere, subcortical: Secondary | ICD-10-CM

## 2020-06-15 LAB — GLUCOSE, CAPILLARY
Glucose-Capillary: 115 mg/dL — ABNORMAL HIGH (ref 70–99)
Glucose-Capillary: 128 mg/dL — ABNORMAL HIGH (ref 70–99)
Glucose-Capillary: 132 mg/dL — ABNORMAL HIGH (ref 70–99)
Glucose-Capillary: 137 mg/dL — ABNORMAL HIGH (ref 70–99)
Glucose-Capillary: 169 mg/dL — ABNORMAL HIGH (ref 70–99)
Glucose-Capillary: 89 mg/dL (ref 70–99)
Glucose-Capillary: 98 mg/dL (ref 70–99)

## 2020-06-15 LAB — BASIC METABOLIC PANEL
Anion gap: 9 (ref 5–15)
BUN: 15 mg/dL (ref 8–23)
CO2: 22 mmol/L (ref 22–32)
Calcium: 8.7 mg/dL — ABNORMAL LOW (ref 8.9–10.3)
Chloride: 107 mmol/L (ref 98–111)
Creatinine, Ser: 1.01 mg/dL (ref 0.61–1.24)
GFR, Estimated: >60 mL/min (ref >60)
Glucose, Bld: 111 mg/dL — ABNORMAL HIGH (ref 70–99)
Potassium: 3.8 mmol/L (ref 3.5–5.1)
Sodium: 138 mmol/L (ref 135–145)

## 2020-06-15 LAB — CBC
HCT: 42.9 % (ref 39.0–52.0)
Hemoglobin: 14.9 g/dL (ref 13.0–17.0)
MCH: 30.8 pg (ref 26.0–34.0)
MCHC: 34.7 g/dL (ref 30.0–36.0)
MCV: 88.8 fL (ref 80.0–100.0)
Platelets: 149 10*3/uL — ABNORMAL LOW (ref 150–400)
RBC: 4.83 MIL/uL (ref 4.22–5.81)
RDW: 13.6 % (ref 11.5–15.5)
WBC: 8.2 10*3/uL (ref 4.0–10.5)
nRBC: 0 % (ref 0.0–0.2)

## 2020-06-15 MED ORDER — METOPROLOL TARTRATE 25 MG PO TABS
25.0000 mg | ORAL_TABLET | Freq: Two times a day (BID) | ORAL | Status: DC
  Administered 2020-06-15 – 2020-06-16 (×2): 25 mg via ORAL
  Filled 2020-06-15 (×2): qty 1

## 2020-06-15 NOTE — Progress Notes (Signed)
  Speech Language Pathology Treatment: Dysphagia;Cognitive-Linquistic  Patient Details Name: [name] MRN: [id number] DOB: [birth year] Today's Date: 06/15/2020 Time: 0932-6712 SLP Time Calculation (min) (ACUTE ONLY): 28 min  Assessment / Plan / Recommendation Clinical Impression   Pt was sleeping upon arrival but awakened to voice and modifications to the environment to maximize alertness (lights on and repositioned to sitting upright in bed).  Pt's RN reports that pt has been coughing after ~5-6 bites/sips during meals.  During presentations of honey thick liquids and pureed textures pt had no overt s/s of aspiration but had noticeable anterior loss of all boluses due to decreased posterior propulsion, poor labial seal, and mouth breathing during PO intake.  Recommend that pt remain on his currently prescribed diet.  SLP introduced EMST device to address cough strength and breath support for speech.  Pt was able to return demonstration of device at 9 cm H2O after moderate instructional cues with no overt s/s of distress or appreciable changes in vital signs.  During conversations with SLP, pt needed mod verbal cues to slow rate, increase vocal intensity, and overarticulate to achieve intelligibility at the word-short phrase level.  Pt was left in bed with bed alarm set and call bell within reach.  Continue per current plan of care.     HPI HPI: 79 y.o. male with atrial fibrillation on Eliquis, presenting with an acute anterior midline pontine hemorrhage  Neuro report includes  severe dysarthria, right facial droop and right hemiplegia.      SLP Plan  Continue with current plan of care       Recommendations  Diet recommendations: Dysphagia 2 (fine chop);Honey-thick liquid Liquids provided via: Cup Medication Administration: Crushed with puree Supervision: Full supervision/cueing for compensatory strategies Compensations: Slow rate;Small sips/bites;Follow solids with liquid;Lingual sweep  for clearance of pocketing Postural Changes and/or Swallow Maneuvers: Seated upright 90 degrees                Oral Care Recommendations: Oral care BID Follow up Recommendations: Inpatient Rehab SLP Visit Diagnosis: Dysarthria and anarthria (R47.1);Dysphagia, oropharyngeal phase (R13.12) Plan: Continue with current plan of care       GO                PageMelanee Spry 06/15/2020, 11:43 AM

## 2020-06-15 NOTE — Progress Notes (Signed)
Physical Therapy Treatment Patient Details Name: [name] MRN: [id number] DOB: 1940/08/14 Today's Date: 06/15/2020    History of Present Illness The pt is a 79 yo male presenting with sudden onset R-sided weakness and dysarthria. 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 PMH includes: afib on Eliquis, AAA s/p stent graft in 2006, CAD s/p stent to LAD in 2005, GERD, HTN, HLD, vertigo, and OSA.    PT Comments    Pt tolerates treatment well but continues to demonstrate profound R sided weakness. Pt requires significant physical assistance to perform bed mobility, and is unable to maintain a full erect standing position with one person assistance at this time despite 4 transfer attempts. Pt participates well in HEP and PT provides education on the need for continued attempts of exercise with the R side to provide further opportunity for motor learning and neuroplasticity. PT continues to recommend CIR placement at this time.   Follow Up Recommendations  CIR     Equipment Recommendations  Wheelchair (measurements PT);Wheelchair cushion (measurements PT);Hospital bed (mechanical lift)    Recommendations for Other Services       Precautions / Restrictions Precautions Precautions: Fall Precaution Comments: R hemiplegia Restrictions Weight Bearing Restrictions: No    Mobility  Bed Mobility Overal bed mobility: Needs Assistance Bed Mobility: Supine to Sit;Sit to Supine;Rolling Rolling: Max assist   Supine to sit: Max assist;HOB elevated Sit to supine: Max assist      Transfers Overall transfer level: Needs assistance Equipment used: 1 person hand held assist Transfers: Sit to/from Stand Sit to Stand: Total assist;+2 physical assistance         General transfer comment: PT provides R knee block and BUE support, pt only able to come to 50% erect stance with PT support, incomplete knee and hip extension  Ambulation/Gait                 Stairs              Wheelchair Mobility    Modified Rankin (Stroke Patients Only) Modified Rankin (Stroke Patients Only) Pre-Morbid Rankin Score: No symptoms Modified Rankin: Severe disability     Balance Overall balance assessment: Needs assistance Sitting-balance support: Single extremity supported;Feet supported Sitting balance-Leahy Scale: Poor Sitting balance - Comments: minA with RUE support   Standing balance support: Bilateral upper extremity supported Standing balance-Leahy Scale: Zero Standing balance comment: totalA to achieve 50% erect standing position                            Cognition Arousal/Alertness: Awake/alert (awakens with mobility, initially lethargic) Behavior During Therapy: WFL for tasks assessed/performed Overall Cognitive Status: Impaired/Different from baseline Area of Impairment: Attention;Following commands;Safety/judgement;Awareness;Problem solving                   Current Attention Level: Sustained   Following Commands: Follows one step commands consistently Safety/Judgement: Decreased awareness of safety;Decreased awareness of deficits Awareness: Emergent Problem Solving: Slow processing;Requires verbal cues;Requires tactile cues        Exercises General Exercises - Lower Extremity Ankle Circles/Pumps: AROM;Left;10 reps Straight Leg Raises: AROM;Left;10 reps    General Comments General comments (skin integrity, edema, etc.): VSS on RA      Pertinent Vitals/Pain Pain Assessment: Faces Faces Pain Scale: No hurt    Home Living                      Prior Function  PT Goals (current goals can now be found in the care plan section) Acute Rehab PT Goals Patient Stated Goal: per spouse to increase his independence Progress towards PT goals: Progressing toward goals    Frequency    Min 4X/week      PT Plan Current plan remains appropriate    Co-evaluation              AM-PAC PT "6 Clicks"  Mobility   Outcome Measure  Help needed turning from your back to your side while in a flat bed without using bedrails?: A Lot Help needed moving from lying on your back to sitting on the side of a flat bed without using bedrails?: A Lot Help needed moving to and from a bed to a chair (including a wheelchair)?: Total Help needed standing up from a chair using your arms (e.g., wheelchair or bedside chair)?: Total Help needed to walk in hospital room?: Total Help needed climbing 3-5 steps with a railing? : Total 6 Click Score: 8    End of Session   Activity Tolerance: Patient tolerated treatment well Patient left: in bed;with call bell/phone within reach;with bed alarm set Nurse Communication: Mobility status;Need for lift equipment PT Visit Diagnosis: Unsteadiness on feet (R26.81);Other abnormalities of gait and mobility (R26.89);Muscle weakness (generalized) (M62.81);Hemiplegia and hemiparesis Hemiplegia - Right/Left: Right Hemiplegia - dominant/non-dominant: Dominant Hemiplegia - caused by: Nontraumatic intracerebral hemorrhage     Time: 2831-5176 PT Time Calculation (min) (ACUTE ONLY): 23 min  Charges:  $Therapeutic Exercise: 8-22 mins $Therapeutic Activity: 8-22 mins                     Arlyss Gandy, PT, DPT Acute Rehabilitation Pager: [phone]    Arlyss Gandy 06/15/2020, 1:46 PM

## 2020-06-15 NOTE — Progress Notes (Signed)
STROKE TEAM PROGRESS NOTE   INTERVAL HISTORY Patient is sitting up in bed.. No neuro changes.  Blood pressure adequately controlled.  Vital signs stable. Therapy evaluations recommend CIR but no bed available today Neurological exam is unchanged.  Blood pressure adequately controlled. OBJECTIVE Vitals:   06/15/20 0417 06/15/20 0418 06/15/20 0500 06/15/20 0852  BP: (!) 159/99 (!) 177/105 122/83 (!) 146/77  Pulse: 92   91  Resp: 18   20  Temp: 99.3 F (37.4 C)   98 F (36.7 C)  TempSrc: Oral   Oral  SpO2: 93%   96%  Weight:      Height:        CBC:  Recent Labs  Lab 06/10/20 0606 06/10/20 0647 06/14/20 0247 06/15/20 0232  WBC 6.0   < > 7.3 8.2  NEUTROABS 4.0  --   --   --   HGB 15.9   < > 15.1 14.9  HCT 47.0   < > 45.8 42.9  MCV 89.9   < > 92.3 88.8  PLT 120*   < > 140* 149*   < > = values in this interval not displayed.    Basic Metabolic Panel:  Recent Labs  Lab 06/14/20 0247 06/15/20 0232  NA 141 138  K 4.1 3.8  CL 106 107  CO2 26 22  GLUCOSE 118* 111*  BUN 16 15  CREATININE 1.02 1.01  CALCIUM 8.8* 8.7*    Lipid Panel:     Component Value Date/Time   CHOL 129 05/27/2020 0943   TRIG 99 06/13/2020 0603   HDL 32 (L) 05/27/2020 0943   CHOLHDL 4.0 05/27/2020 0943   CHOLHDL 5.8 11/20/2007 0425   VLDL 10 11/20/2007 0425   LDLCALC 84 05/27/2020 0943   HgbA1c:  Lab Results  Component Value Date   HGBA1C 6.1 (H) 06/11/2020   Urine Drug Screen:     Component Value Date/Time   LABOPIA NONE DETECTED 06/10/2020 1328   COCAINSCRNUR NONE DETECTED 06/10/2020 1328   LABBENZ NONE DETECTED 06/10/2020 1328   AMPHETMU NONE DETECTED 06/10/2020 1328   THCU NONE DETECTED 06/10/2020 1328   LABBARB NONE DETECTED 06/10/2020 1328    Alcohol Level     Component Value Date/Time   ETH <10 06/10/2020 0606    IMAGING  CT Code Stroke CTA Head W/WO contrast CT Code Stroke CTA Neck W/WO contrast CT Code Stroke Cerebral Perfusion with  contrast 06/10/2020 IMPRESSION:  1. Poorly enhancing dolichoectatic Basilar Artery, although the vessel does appear to remain patent. 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   2. Subsequently, there is confluent abnormal posterior circulation T-max >6s on CTP, with sparing the Left PCA territory on the basis of a fetal PCA origin on that side. No infarct core detected on CTP.  3. No CTA spot sign detected in the acute ventral pontine hemorrhage.  4. Bilateral calcified carotid artery atherosclerosis, 65-70% stenosis at the Left ICA bulb. No hemodynamically significant stenosis on the right. Dolichoectatic ICA siphons.  5. Aortic Atherosclerosis (ICD10-I70.0).   CT HEAD CODE STROKE WO CONTRAST 06/10/2020 IMPRESSION:  1. 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 No mass effect, extra-axial- or intraventricular extension of blood at this time.  2. 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   CT head WO Contrast  06/11/2020 IMPRESSION: Unchanged small focus of  hemorrhage in the brainstem.  MRI head WO Contrast 06/10/2020 IMPRESSION:  Negative for acute ischemic infarct  14 mm acute hemorrhage in the ventral pons without underlying mass or change from earlier today.  Numerous areas of chronic microhemorrhage in the brain bilaterally. Correlate with history of poorly controlled hypertension. If no such history is available, consider cerebral amyloid.  Chest Port 1 View 06/10/2020 IMPRESSION:  1. No acute cardiopulmonary abnormality.  2. Chronic thoracic Aortic Endograft.   Transthoracic Echocardiogram  06/10/2020 IMPRESSIONS  1. Left ventricular ejection fraction, by estimation, is 60 to 65%. The  left ventricle has normal  function. The left ventricle has no regional  wall motion abnormalities. There is moderate left ventricular hypertrophy.  Left ventricular diastolic function  could not be evaluated.  2. Right ventricular systolic function is normal. The right ventricular  size is normal. Tricuspid regurgitation signal is inadequate for assessing  PA pressure.  3. Left atrial size was mild to moderately dilated.  4. The mitral valve is normal in structure. Trivial mitral valve  regurgitation.  5. The aortic valve is tricuspid. There is mild calcification of the  aortic valve. There is mild thickening of the aortic valve. Aortic valve  regurgitation is mild. Mild aortic valve stenosis.  6. Aortic dilatation noted. There is mild dilatation of the ascending  aorta, measuring 42 mm.    ECG - atrial fibrillation - ventricular response 91 BPM (See cardiology reading for complete details)   PHYSICAL EXAM   Temp:  [97.5 F (36.4 C)-99.3 F (37.4 C)] 98 F (36.7 C) (12/22 0852) Pulse Rate:  [72-92] 91 (12/22 0852) Resp:  [18-25] 20 (12/22 0852) BP: (112-177)/(77-105) 146/77 (12/22 0852) SpO2:  [93 %-97 %] 96 % (12/22 0852) Pleasant elderly Caucasian male not in distress..  . Afebrile. Head is nontraumatic. Neck is supple without bruit.    Cardiac exam no murmur or gallop. Lungs are clear to auscultation. Distal pulses are well felt. Neurological Exam : Awake alert oriented to time place and person.  Severe dysarthria and can be understood with some difficulty.  Able to name repeat well.  Extraocular movements are full range without nystagmus.  Blinks to threat bilaterally.  Tongue midline.  Motor system exam shows dense right hemiplegia with 0/5 right upper and 2/5 right lower extremity strength.  Normal antigravity movements on the left side.  Sensation appears preserved bilaterally.  Gait not tested. ASSESSMENT/PLAN Mr. Dorse Locy is a 79 y.o. male with history of  atrial fibrillation (on Eliquis -  but missed last evening dose), AAA s/p stent graft in 2006, CAD, shingles x 2, HLD, HTN, sleep apnea and vertigo presenting with severe Rt sided weakness, facial droop and severe dysarthria. He did not receive IV t-PA due to ICH.  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 No mass effect, extra-axial- or intraventricular extension of blood at this time. 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 Hemodynamically significant bilateral vertebral artery stenoses (bilateral distal V4 and also dominant Left vertebral origin). Bilateral calcified carotid artery atherosclerosis, 65-70% stenosis at the Left ICA bulb.  MRI head ventral pontine acute hemorrhage 14 mm.  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  2D Echo - EF 60 - 65%. No cardiac source of emboli identified.   Sars Corona Virus 2 - negative  LDL - 84  HgbA1c - 6.1  UDS - neg  VTE prophylaxis - SCDs  Eliquis (apixaban) daily prior to admission, now on No antithrombotic  Ongoing aggressive stroke risk factor management. Given Amyloid and ICH, anticoagulation not recommended  Therapy recommendations: CIR recommended -await bed  Disposition:  Pending  Chronic afib  On eliquis PTA  Received Kcentra reversal   Now on no antithrombotics  Given MRI showing numerous chronic microhemorrhages bilaterally, concerning for hypertensive versus cerebral amyloid, likely not to be future anticoagulation candidate  Hypertension  Home BP meds: metoprolol ; amlodipine  Current BP meds:  metoprolol (change to Toprol XL for once per  day dosing) and amlodipine (12/19 - now able to take PO meds)  Off of cleviprex but BP borderline, we adjusted BP meds over the weekend may consider adjusting further Monday . SBP goal < 160 mm Hg . Long-term BP goal normotensive  Hyperlipidemia  Home Lipid lowering medication: none   LDL 84, goal < 70  Hold off statin given current ICH  Consider statin at discharge  Dysphagia  Did not pass swallow  NPO -> 12/18 - SLP Diet Recommendations: Dysphagia 2 (Fine chop) solids;Honey thick liquids   IV fluid  Speech to follow  Other Stroke Risk Factors  Advanced age  Coronary artery disease  Obstructive sleep apnea  AAA stat post stenting in 2006  Other Active Problems, Findings and Recommendations  Code status - Full Code  Aortic Atherosclerosis (ICD10-I70.0)   Hypokalemia - potassium - 3.4->3.3 - supplement ->3.6  Mild thrombocytopenia - platelets - 120->123  Hospital day # 5   Continue mobilization out of bed and ongoing therapy consults.  Patient medically stable.  Transfer to inpatient rehab for the next few days when bed is available.  Discussed with patient, rehab coordinator and  answered questions.    Delia Heady, MD To contact Stroke Continuity provider, please refer to [email or web address]. After hours, contact General Neurology

## 2020-06-16 ENCOUNTER — Encounter (HOSPITAL_COMMUNITY): Payer: Self-pay | Admitting: Neurology

## 2020-06-16 ENCOUNTER — Other Ambulatory Visit: Payer: Self-pay

## 2020-06-16 ENCOUNTER — Encounter (HOSPITAL_COMMUNITY): Payer: Self-pay | Admitting: Physical Medicine & Rehabilitation

## 2020-06-16 ENCOUNTER — Inpatient Hospital Stay (HOSPITAL_COMMUNITY)
Admission: RE | Admit: 2020-06-16 | Discharge: 2020-07-08 | DRG: 057 | Disposition: A | Discharge status: Skilled Nursing Facility | Payer: Medicare Other | Source: Intra-hospital | Attending: Physical Medicine & Rehabilitation | Admitting: Physical Medicine & Rehabilitation

## 2020-06-16 DIAGNOSIS — R471 Dysarthria and anarthria: Secondary | ICD-10-CM | POA: Diagnosis present

## 2020-06-16 DIAGNOSIS — I700 Atherosclerosis of aorta: Secondary | ICD-10-CM

## 2020-06-16 DIAGNOSIS — Z8249 Family history of ischemic heart disease and other diseases of the circulatory system: Secondary | ICD-10-CM | POA: Diagnosis not present

## 2020-06-16 DIAGNOSIS — R7303 Prediabetes: Secondary | ICD-10-CM | POA: Diagnosis present

## 2020-06-16 DIAGNOSIS — I619 Nontraumatic intracerebral hemorrhage, unspecified: Secondary | ICD-10-CM | POA: Diagnosis present

## 2020-06-16 DIAGNOSIS — I610 Nontraumatic intracerebral hemorrhage in hemisphere, subcortical: Secondary | ICD-10-CM

## 2020-06-16 DIAGNOSIS — G4733 Obstructive sleep apnea (adult) (pediatric): Secondary | ICD-10-CM

## 2020-06-16 DIAGNOSIS — K219 Gastro-esophageal reflux disease without esophagitis: Secondary | ICD-10-CM | POA: Diagnosis present

## 2020-06-16 DIAGNOSIS — Z820 Family history of epilepsy and other diseases of the nervous system: Secondary | ICD-10-CM | POA: Diagnosis not present

## 2020-06-16 DIAGNOSIS — G8191 Hemiplegia, unspecified affecting right dominant side: Secondary | ICD-10-CM

## 2020-06-16 DIAGNOSIS — I10 Essential (primary) hypertension: Secondary | ICD-10-CM

## 2020-06-16 DIAGNOSIS — I613 Nontraumatic intracerebral hemorrhage in brain stem: Secondary | ICD-10-CM | POA: Diagnosis not present

## 2020-06-16 DIAGNOSIS — B9562 Methicillin resistant Staphylococcus aureus infection as the cause of diseases classified elsewhere: Secondary | ICD-10-CM | POA: Diagnosis present

## 2020-06-16 DIAGNOSIS — I69151 Hemiplegia and hemiparesis following nontraumatic intracerebral hemorrhage affecting right dominant side: Principal | ICD-10-CM

## 2020-06-16 DIAGNOSIS — I69122 Dysarthria following nontraumatic intracerebral hemorrhage: Secondary | ICD-10-CM | POA: Diagnosis not present

## 2020-06-16 DIAGNOSIS — E785 Hyperlipidemia, unspecified: Secondary | ICD-10-CM | POA: Diagnosis present

## 2020-06-16 DIAGNOSIS — R1312 Dysphagia, oropharyngeal phase: Secondary | ICD-10-CM | POA: Diagnosis present

## 2020-06-16 DIAGNOSIS — Z955 Presence of coronary angioplasty implant and graft: Secondary | ICD-10-CM

## 2020-06-16 DIAGNOSIS — I69391 Dysphagia following cerebral infarction: Secondary | ICD-10-CM

## 2020-06-16 DIAGNOSIS — R1310 Dysphagia, unspecified: Secondary | ICD-10-CM | POA: Diagnosis present

## 2020-06-16 DIAGNOSIS — I4891 Unspecified atrial fibrillation: Secondary | ICD-10-CM | POA: Diagnosis present

## 2020-06-16 DIAGNOSIS — Z20822 Contact with and (suspected) exposure to covid-19: Secondary | ICD-10-CM | POA: Diagnosis present

## 2020-06-16 DIAGNOSIS — I6503 Occlusion and stenosis of bilateral vertebral arteries: Secondary | ICD-10-CM | POA: Diagnosis present

## 2020-06-16 DIAGNOSIS — I2510 Atherosclerotic heart disease of native coronary artery without angina pectoris: Secondary | ICD-10-CM | POA: Diagnosis present

## 2020-06-16 DIAGNOSIS — Z8614 Personal history of Methicillin resistant Staphylococcus aureus infection: Secondary | ICD-10-CM

## 2020-06-16 DIAGNOSIS — D696 Thrombocytopenia, unspecified: Secondary | ICD-10-CM

## 2020-06-16 LAB — GLUCOSE, CAPILLARY
Glucose-Capillary: 133 mg/dL — ABNORMAL HIGH (ref 70–99)
Glucose-Capillary: 98 mg/dL (ref 70–99)
Glucose-Capillary: 131 mg/dL — ABNORMAL HIGH (ref 70–99)
Glucose-Capillary: 108 mg/dL — ABNORMAL HIGH (ref 70–99)

## 2020-06-16 MED ORDER — INSULIN ASPART 100 UNIT/ML ~~LOC~~ SOLN
0.0000 [IU] | SUBCUTANEOUS | Status: DC
  Administered 2020-06-17: 17:00:00 3 [IU] via SUBCUTANEOUS
  Administered 2020-06-17 – 2020-06-18 (×2): 2 [IU] via SUBCUTANEOUS
  Administered 2020-06-18: 17:00:00 3 [IU] via SUBCUTANEOUS
  Administered 2020-06-18: 2 [IU] via SUBCUTANEOUS
  Administered 2020-06-18 – 2020-06-19 (×3): 3 [IU] via SUBCUTANEOUS
  Administered 2020-06-19 – 2020-06-20 (×6): 2 [IU] via SUBCUTANEOUS
  Administered 2020-06-20 (×3): 3 [IU] via SUBCUTANEOUS
  Administered 2020-06-21: 21:00:00 2 [IU] via SUBCUTANEOUS
  Administered 2020-06-21 (×2): 3 [IU] via SUBCUTANEOUS
  Administered 2020-06-21 (×2): 2 [IU] via SUBCUTANEOUS
  Administered 2020-06-21 – 2020-06-22 (×2): 3 [IU] via SUBCUTANEOUS
  Administered 2020-06-22: 13:00:00 2 [IU] via SUBCUTANEOUS
  Administered 2020-06-22 – 2020-06-23 (×6): 3 [IU] via SUBCUTANEOUS
  Administered 2020-06-23: 09:00:00 2 [IU] via SUBCUTANEOUS
  Administered 2020-06-23: 3 [IU] via SUBCUTANEOUS
  Administered 2020-06-24: 2 [IU] via SUBCUTANEOUS
  Administered 2020-06-24: 3 [IU] via SUBCUTANEOUS
  Administered 2020-06-24: 2 [IU] via SUBCUTANEOUS
  Administered 2020-06-24 – 2020-06-25 (×4): 3 [IU] via SUBCUTANEOUS
  Administered 2020-06-25 (×2): 2 [IU] via SUBCUTANEOUS
  Administered 2020-06-25 – 2020-06-26 (×4): 3 [IU] via SUBCUTANEOUS
  Administered 2020-06-26: 2 [IU] via SUBCUTANEOUS
  Administered 2020-06-26 – 2020-06-27 (×2): 3 [IU] via SUBCUTANEOUS
  Administered 2020-06-27 – 2020-06-28 (×2): 2 [IU] via SUBCUTANEOUS
  Administered 2020-06-28 (×3): 3 [IU] via SUBCUTANEOUS
  Administered 2020-06-28: 2 [IU] via SUBCUTANEOUS
  Administered 2020-06-29 (×2): 3 [IU] via SUBCUTANEOUS
  Administered 2020-06-29: 2 [IU] via SUBCUTANEOUS
  Administered 2020-06-30: 3 [IU] via SUBCUTANEOUS
  Administered 2020-06-30 (×3): 2 [IU] via SUBCUTANEOUS
  Administered 2020-06-30 – 2020-07-02 (×5): 3 [IU] via SUBCUTANEOUS

## 2020-06-16 MED ORDER — METOPROLOL TARTRATE 50 MG PO TABS: 50.0000 mg | ORAL_TABLET | Freq: Two times a day (BID) | ORAL | Status: DC

## 2020-06-16 MED ORDER — ACETAMINOPHEN 160 MG/5ML PO SOLN
650.0000 mg | ORAL | Status: DC | PRN
  Administered 2020-06-25 – 2020-07-05 (×4): 650 mg
  Filled 2020-06-16 (×6): qty 20.3

## 2020-06-16 MED ORDER — SODIUM CHLORIDE 0.9 % IV SOLN: 50.0000 mL | INTRAVENOUS | 0 refills | Status: DC

## 2020-06-16 MED ORDER — ACETAMINOPHEN 650 MG RE SUPP: 650.0000 mg | RECTAL | Status: DC | PRN

## 2020-06-16 MED ORDER — SENNOSIDES-DOCUSATE SODIUM 8.6-50 MG PO TABS: 1.0000 | ORAL_TABLET | Freq: Two times a day (BID) | ORAL | Status: DC

## 2020-06-16 MED ORDER — METOPROLOL TARTRATE 50 MG PO TABS
50.0000 mg | ORAL_TABLET | Freq: Three times a day (TID) | ORAL | Status: DC
  Administered 2020-06-16 – 2020-06-17 (×4): 50 mg via ORAL
  Filled 2020-06-16 (×4): qty 1

## 2020-06-16 MED ORDER — INSULIN ASPART 100 UNIT/ML ~~LOC~~ SOLN: 0.0000 [IU] | SUBCUTANEOUS | 11 refills | Status: DC

## 2020-06-16 MED ORDER — SENNOSIDES-DOCUSATE SODIUM 8.6-50 MG PO TABS
1.0000 | ORAL_TABLET | Freq: Two times a day (BID) | ORAL | Status: DC
  Administered 2020-06-16 – 2020-06-17 (×3): 1 via ORAL
  Filled 2020-06-16 (×3): qty 1

## 2020-06-16 MED ORDER — ACETAMINOPHEN 325 MG PO TABS
650.0000 mg | ORAL_TABLET | ORAL | Status: DC | PRN
  Administered 2020-06-28 – 2020-07-03 (×2): 650 mg via ORAL
  Filled 2020-06-16 (×3): qty 2

## 2020-06-16 MED ORDER — PANTOPRAZOLE SODIUM 40 MG PO TBEC: 40.0000 mg | DELAYED_RELEASE_TABLET | Freq: Every day | ORAL | Status: DC

## 2020-06-16 MED ORDER — PANTOPRAZOLE SODIUM 40 MG PO TBEC
40.0000 mg | DELAYED_RELEASE_TABLET | Freq: Every day | ORAL | Status: DC
  Administered 2020-06-16 – 2020-06-17 (×2): 40 mg via ORAL
  Filled 2020-06-16 (×2): qty 1

## 2020-06-16 MED ORDER — AMLODIPINE BESYLATE 10 MG PO TABS
10.0000 mg | ORAL_TABLET | Freq: Every day | ORAL | Status: DC
  Administered 2020-06-17: 08:00:00 10 mg via ORAL
  Filled 2020-06-16: qty 1

## 2020-06-16 NOTE — Discharge Summary (Addendum)
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 Active Problems:   HYPERTENSION, BENIGN SYSTEMIC   Coronary atherosclerosis   Thoracic aortic aneurysm [facility]) s/p stent 2006   Hyperlipidemia   Atrial fibrillation (HCC)   Dysphagia due to recent stroke   OSA (obstructive sleep apnea)   Aortic atherosclerosis (HCC)   Thrombocytopenia (HCC)   Medications to be continued on Rehab Allergies as of 06/16/2020       Reactions   Crestor [rosuvastatin] Other (See Comments)   Severe pain   Lipitor [atorvastatin] Other (See Comments)   Severe pain        Medication List     TAKE these medications    amLODipine 10 MG tablet Commonly known as: NORVASC TAKE 1 TABLET(10 MG) BY MOUTH DAILY What changed: See the new instructions.   insulin aspart 100 UNIT/ML injection Commonly known as: novoLOG Inject 0-15 Units into the skin every 4 (four) hours.   metoprolol tartrate 50 MG tablet Commonly known as: LOPRESSOR Take 1 tablet (50 mg total) by mouth 2 (two) times daily. What changed:  medication strength how much to take when to take this   pantoprazole 40 MG tablet Commonly known as: PROTONIX Take 1 tablet (40 mg total) by mouth at bedtime. What changed:  how much to take how to take this when to take this additional instructions   senna-docusate 8.6-50 MG tablet Commonly known as: Senokot-S Take 1 tablet by mouth 2 (two) times daily.   sodium chloride 0.9 % infusion Inject 50 mLs into the vein continuous.        LABORATORY STUDIES CBC    Component Value Date/Time   WBC 8.2  06/15/2020 0232   RBC 4.83 06/15/2020 0232   HGB 14.9 06/15/2020 0232   HCT 42.9 06/15/2020 0232   PLT 149 (L) 06/15/2020 0232   MCV 88.8 06/15/2020 0232   MCH 30.8 06/15/2020 0232   MCHC 34.7 06/15/2020 0232   RDW 13.6 06/15/2020 0232   LYMPHSABS 1.3 06/10/2020 0606   MONOABS 0.6 06/10/2020 0606   EOSABS 0.0 06/10/2020 0606   BASOSABS 0.0 06/10/2020 0606   CMP    Component Value Date/Time   NA 138 06/15/2020 0232   NA 142 08/26/2019 1117   K 3.8 06/15/2020 0232   CL 107 06/15/2020 0232   CO2 22 06/15/2020 0232   GLUCOSE 111 (H) 06/15/2020 0232   BUN 15 06/15/2020 0232   BUN 15 08/26/2019 1117   CREATININE 1.01 06/15/2020 0232   CREATININE 1.32 (H) 10/11/2015 1444   CALCIUM 8.7 (L) 06/15/2020 0232   PROT 6.6 06/10/2020 0606   PROT 6.7 05/27/2020 0943   ALBUMIN 3.0 (L) 06/10/2020 0606   ALBUMIN 3.8 05/27/2020 0943   AST 31 06/10/2020 0606   ALT 23 06/10/2020 0606   ALKPHOS 58 06/10/2020 0606   BILITOT 1.5 (H) 06/10/2020 0606   BILITOT 0.7 05/27/2020 0943   GFRNONAA >60 06/15/2020 0232   GFRAA 57 (L) 11/23/2019 1849   COAGS Lab Results  Component Value Date   INR 1.2 06/10/2020   INR 1.02 11/02/2015  INR 1.01 10/11/2015   Lipid Panel    Component Value Date/Time   CHOL 129 05/27/2020 0943   TRIG 99 06/13/2020 0603   HDL 32 (L) 05/27/2020 0943   CHOLHDL 4.0 05/27/2020 0943   CHOLHDL 5.8 11/20/2007 0425   VLDL 10 11/20/2007 0425   LDLCALC 84 05/27/2020 0943   HgbA1C  Lab Results  Component Value Date   HGBA1C 6.1 (H) 06/11/2020   Urinalysis    Component Value Date/Time   COLORURINE YELLOW 06/10/2020 1328   APPEARANCEUR CLEAR 06/10/2020 1328   LABSPEC 1.036 (H) 06/10/2020 1328   PHURINE 5.0 06/10/2020 1328   GLUCOSEU NEGATIVE 06/10/2020 1328   HGBUR NEGATIVE 06/10/2020 1328   BILIRUBINUR NEGATIVE 06/10/2020 1328   KETONESUR NEGATIVE 06/10/2020 1328   PROTEINUR NEGATIVE 06/10/2020 1328   NITRITE NEGATIVE 06/10/2020 1328   LEUKOCYTESUR NEGATIVE  06/10/2020 1328   Urine Drug Screen     Component Value Date/Time   LABOPIA NONE DETECTED 06/10/2020 1328   COCAINSCRNUR NONE DETECTED 06/10/2020 1328   LABBENZ NONE DETECTED 06/10/2020 1328   AMPHETMU NONE DETECTED 06/10/2020 1328   THCU NONE DETECTED 06/10/2020 1328   LABBARB NONE DETECTED 06/10/2020 1328    Alcohol Level    Component Value Date/Time   ETH <10 06/10/2020 0606    SIGNIFICANT DIAGNOSTIC STUDIES CT Code Stroke CTA Head W/WO contrast  Result Date: 06/10/2020 CLINICAL DATA:  80 year old male code stroke presentation with right side weakness and slurred speech. 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 EXAM: CT ANGIOGRAPHY HEAD AND NECK CT PERFUSION BRAIN TECHNIQUE: Multidetector CT imaging of the head and neck was performed using the standard protocol during bolus administration of intravenous contrast. Multiplanar CT image reconstructions and MIPs were obtained to evaluate the vascular anatomy. Carotid stenosis measurements (when applicable) are obtained utilizing NASCET criteria, using the distal internal carotid diameter as the denominator. Multiphase CT imaging of the brain was performed following IV bolus contrast injection. Subsequent parametric perfusion maps were calculated using RAPID software. CONTRAST:  64mL OMNIPAQUE IOHEXOL 350 MG/ML SOLN COMPARISON:  Plain head CT 0614 hours today. FINDINGS: CT Brain Perfusion Findings: CBF (<30%) Volume: None Perfusion (Tmax>6.0s) volume: , predominantly in the cerebellar hemispheres and right PCA territory, see below. Mismatch Volume: Infarction Location:No core infarct detected. 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 Carious dentition. Ankylosis continues into the upper thoracic spine. No acute osseous abnormality identified. Upper chest: Negative. Other neck: No acute finding. Aortic arch: Tortuous aortic arch with calcified  atherosclerosis. Three vessel arch configuration. Right carotid system: Brachiocephalic tortuosity and calcified plaque without stenosis. Tortuous proximal right CCA without stenosis. Calcified distal right CCA and right ICA origin and bulb plaque. Stenosis is up to 50 % with respect to the distal vessel at the distal right ICA bulb. Left carotid system: Calcified plaque at the left CCA origin without stenosis. Tortuous proximal left CCA. Mild calcified plaque at the distal left CCA and left ICA origin. Moderate calcified plaque at the distal ICA bulb with stenosis up to 65-70 % with respect to the distal vessel (series 5, image 109). The left ICA remains patent to the skull base. Vertebral arteries: Tortuous proximal right subclavian artery with abundant calcified plaque. The right vertebral artery origin remains patent without stenosis. Non dominant right vertebral artery is patent through the V2 segment with only mild plaque. Decreasing enhancement of the right vertebral artery V3 segment which is calcified to the skull base, although without  high-grade stenosis. See intracranial findings below. Tortuous and calcified proximal left subclavian artery without significant stenosis. However, calcified plaque at the left vertebral artery origin results in high-grade stenosis (series 8, image 181). The left vertebral artery is dominant. Tortuous left V1 segment. Additional calcified left V2 segment plaque although without high-grade stenosis. Decreasing distal left vertebral artery enhancement compared to the carotids. V3 calcified plaque, although no additional high-grade stenosis to the skull base. CTA HEAD Posterior circulation: Non dominant distal right vertebral artery with calcified but patent early origin of the right PICA. Distal to PICA V4 calcified plaque results in severe stenoses (series 6, image 319, 315) but the right vertebral remains patent to the vertebrobasilar junction. Dominant left V4 segment is  heavily calcified and also demonstrates moderate to severe stenosis on series 6, image 310. But the vessel remains patent to the vertebrobasilar junction. Dolichoectatic and calcified basilar artery with considerably decreased enhancement compared to the ICA siphons, although the vessel does appear to remain patent (mid basilar lumen 111 Hounsfield units versus noncontrast CT 55 Hounsfield units earlier today). The basilar tip appears patent as well. PCA origins appear patent. SCA and AICA vessels not well visualized. There is a fetal type left PCA. Left PCA branches appear patent with mild to moderate irregularity. Asymmetrically diminished right PCA enhancement although no definite right PCA P1 or P2 occlusion. No CTA spot sign is evident in the ventral pontine hemorrhage. Anterior circulation: Dolichoectatic and calcified ICA siphons are patent with considerably more enhancement than the basilar artery. No ICA siphon stenosis despite the atherosclerosis. Patent carotid termini, MCA and ACA origins. Tortuous A1 segments. Normal anterior communicating artery. Bilateral ACA branches are within normal limits. Left MCA M1 segment and bifurcation are patent without stenosis. Right MCA M1 segment and bifurcation are patent without stenosis. There is mild calcified bilateral M1 atherosclerosis. No MCA branch occlusion is identified. Venous sinuses: Early contrast timing, not evaluated. Anatomic variants: Dominant left vertebral artery. Fetal type left PCA origin. Review of the MIP images confirms the above findings IMPRESSION: 1. Poorly enhancing dolichoectatic Basilar Artery, although the vessel does appear to remain patent. 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 This was communicated to Dr. Otelia Limes at [phone] hours by text page via the [facility] messaging system. 2.  Subsequently, there is confluent abnormal posterior circulation T-max >6s on CTP, with sparing the Left PCA territory on the basis of a fetal PCA origin on that side. No infarct core detected on CTP. 3. No CTA spot sign detected in the acute ventral pontine hemorrhage. 4. Bilateral calcified carotid artery atherosclerosis, 65-70% stenosis at the Left ICA bulb. No hemodynamically significant stenosis on the right. Dolichoectatic ICA siphons. 5.  Aortic Atherosclerosis (ICD10-I70.0). Electronically Signed   By: Odessa Fleming M.D.   On: 06/10/2020 07:27   CT HEAD WO CONTRAST  Result Date: 06/11/2020 CLINICAL DATA:  Stroke follow-up EXAM: CT HEAD WITHOUT CONTRAST TECHNIQUE: Contiguous axial images were obtained from the base of the skull through the vertex without intravenous contrast. COMPARISON:  06/10/2020 FINDINGS: Brain: Small focus of hemorrhage in the brainstem is unchanged. There is periventricular hypoattenuation compatible with chronic microvascular disease. Generalized volume loss. Vascular: Vertebral and carotid atherosclerotic calcification. Skull: Normal Sinuses/Orbits: There is no paranasal sinus fluid level or advanced mucosal thickening. There is no mastoid or middle ear effusion. The orbits are normal. Other: None IMPRESSION: Unchanged small  focus of hemorrhage in the brainstem. Electronically Signed   By: Deatra RobinsonKevin  Herman M.D.   On: 06/11/2020 05:16   CT Code Stroke CTA Neck W/WO contrast  Result Date: 06/10/2020 CLINICAL DATA:  79 year old male code stroke presentation with right side weakness and slurred speech. 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 EXAM: CT ANGIOGRAPHY HEAD AND NECK CT PERFUSION BRAIN TECHNIQUE: Multidetector CT imaging of the head and neck was performed using the standard protocol during bolus administration of intravenous contrast. Multiplanar CT image reconstructions and MIPs were obtained to evaluate the vascular anatomy. Carotid stenosis measurements (when applicable)  are obtained utilizing NASCET criteria, using the distal internal carotid diameter as the denominator. Multiphase CT imaging of the brain was performed following IV bolus contrast injection. Subsequent parametric perfusion maps were calculated using RAPID software. CONTRAST:  95mL OMNIPAQUE IOHEXOL 350 MG/ML SOLN COMPARISON:  Plain head CT 0614 hours today. FINDINGS: CT Brain Perfusion Findings: CBF (<30%) Volume: None Perfusion (Tmax>6.0s) volume: 124mL, predominantly in the cerebellar hemispheres and right PCA territory, see below. Mismatch Volume: 124mL Infarction Location:No core infarct detected. 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 Carious dentition. Ankylosis continues into the upper thoracic spine. No acute osseous abnormality identified. Upper chest: Negative. Other neck: No acute finding. Aortic arch: Tortuous aortic arch with calcified atherosclerosis. Three vessel arch configuration. Right carotid system: Brachiocephalic tortuosity and calcified plaque without stenosis. Tortuous proximal right CCA without stenosis. Calcified distal right CCA and right ICA origin and bulb plaque. Stenosis is up to 50 % with respect to the distal vessel at the distal right ICA bulb. Left carotid system: Calcified plaque at the left CCA origin without stenosis. Tortuous proximal left CCA. Mild calcified plaque at the distal left CCA and left ICA origin. Moderate calcified plaque at the distal ICA bulb with stenosis up to 65-70 % with respect to the distal vessel (series 5, image 109). The left ICA remains patent to the skull base. Vertebral arteries: Tortuous proximal right subclavian artery with abundant calcified plaque. The right vertebral artery origin remains patent without stenosis. Non dominant right vertebral artery is patent through the V2 segment with only mild plaque. Decreasing enhancement of the right vertebral artery V3 segment  which is calcified to the skull base, although without high-grade stenosis. See intracranial findings below. Tortuous and calcified proximal left subclavian artery without significant stenosis. However, calcified plaque at the left vertebral artery origin results in high-grade stenosis (series 8, image 181). The left vertebral artery is dominant. Tortuous left V1 segment. Additional calcified left V2 segment plaque although without high-grade stenosis. Decreasing distal left vertebral artery enhancement compared to the carotids. V3 calcified plaque, although no additional high-grade stenosis to the skull base. CTA HEAD Posterior circulation: Non dominant distal right vertebral artery with calcified but patent early origin of the right PICA. Distal to PICA V4 calcified plaque results in severe stenoses (series 6, image 319, 315) but the right vertebral remains patent to the vertebrobasilar junction. Dominant left V4 segment is heavily calcified and also demonstrates moderate to severe stenosis on series 6, image 310. But the vessel remains patent to the vertebrobasilar junction. Dolichoectatic and calcified basilar artery with considerably decreased enhancement compared to the ICA siphons, although the vessel does appear to remain patent (mid basilar lumen 111 Hounsfield units versus noncontrast CT 55 Hounsfield units earlier today). The basilar tip appears patent as well. PCA origins appear patent. SCA and AICA vessels not well visualized. There  is a fetal type left PCA. Left PCA branches appear patent with mild to moderate irregularity. Asymmetrically diminished right PCA enhancement although no definite right PCA P1 or P2 occlusion. No CTA spot sign is evident in the ventral pontine hemorrhage. Anterior circulation: Dolichoectatic and calcified ICA siphons are patent with considerably more enhancement than the basilar artery. No ICA siphon stenosis despite the atherosclerosis. Patent carotid termini, MCA and ACA  origins. Tortuous A1 segments. Normal anterior communicating artery. Bilateral ACA branches are within normal limits. Left MCA M1 segment and bifurcation are patent without stenosis. Right MCA M1 segment and bifurcation are patent without stenosis. There is mild calcified bilateral M1 atherosclerosis. No MCA branch occlusion is identified. Venous sinuses: Early contrast timing, not evaluated. Anatomic variants: Dominant left vertebral artery. Fetal type left PCA origin. Review of the MIP images confirms the above findings IMPRESSION: 1. Poorly enhancing dolichoectatic Basilar Artery, although the vessel does appear to remain patent. 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 This was communicated to Dr. Otelia Limes at [phone] hours by text page via the [facility] messaging system. 2. Subsequently, there is confluent abnormal posterior circulation T-max >6s on CTP, with sparing the Left PCA territory on the basis of a fetal PCA origin on that side. No infarct core detected on CTP. 3. No CTA spot sign detected in the acute ventral pontine hemorrhage. 4. Bilateral calcified carotid artery atherosclerosis, 65-70% stenosis at the Left ICA bulb. No hemodynamically significant stenosis on the right. Dolichoectatic ICA siphons. 5.  Aortic Atherosclerosis (ICD10-I70.0). Electronically Signed   By: Odessa Fleming M.D.   On: 06/10/2020 07:27   MR BRAIN WO CONTRAST  Result Date: 06/10/2020 CLINICAL DATA:  Stroke.  Intracranial hemorrhage. EXAM: MRI HEAD WITHOUT CONTRAST TECHNIQUE: Multiplanar, multiecho pulse sequences of the brain and surrounding structures were obtained without intravenous contrast. COMPARISON:  CT head 06/10/2020 FINDINGS: Brain: Negative for acute ischemic infarction. Mild chronic microvascular ischemic change in the white matter bilaterally. 14 mm hemorrhage in the  ventral pons as noted on CT. No interval change. No underlying mass. Numerous other areas of chronic microhemorrhage throughout the cerebellum and cerebral hemispheres bilaterally as well as in the thalamus bilaterally and right basal ganglia. Mild atrophy without hydrocephalus or mass lesion. Vascular: Dolichoectasia of the distal vertebral arteries and basilar artery. Ectatic and tortuous internal carotid arteries bilaterally. Normal arterial flow voids. Skull and upper cervical spine: No acute skeletal abnormality. C1-2 arthropathy with pannus posterior to the dens without significant spinal stenosis. Sinuses/Orbits: Paranasal sinuses clear.  Negative orbit Other: None IMPRESSION: Negative for acute ischemic infarct 14 mm acute hemorrhage in the ventral pons without underlying mass or change from earlier today. Numerous areas of chronic microhemorrhage in the brain bilaterally. Correlate with history of poorly controlled hypertension. If no such history is available, consider cerebral amyloid. Electronically Signed   By: Marlan Palau M.D.   On: 06/10/2020 13:21   CT Code Stroke Cerebral Perfusion with contrast  Result Date: 06/10/2020 CLINICAL DATA:  79 year old male code stroke presentation with right side weakness and slurred speech. 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 EXAM: CT ANGIOGRAPHY HEAD AND NECK CT PERFUSION BRAIN TECHNIQUE: Multidetector CT imaging of the head and neck was performed using the standard protocol during bolus administration of intravenous contrast. Multiplanar CT image reconstructions and MIPs were obtained to evaluate the vascular anatomy. Carotid stenosis measurements (when applicable) are  obtained utilizing NASCET criteria, using the distal internal carotid diameter as the denominator. Multiphase CT imaging of the brain was performed following IV bolus contrast injection. Subsequent parametric perfusion maps were calculated using RAPID software. CONTRAST:  95mL OMNIPAQUE  IOHEXOL 350 MG/ML SOLN COMPARISON:  Plain head CT 0614 hours today. FINDINGS: CT Brain Perfusion Findings: CBF (<30%) Volume: None Perfusion (Tmax>6.0s) volume: , predominantly in the cerebellar hemispheres and right PCA territory, see below. Mismatch Volume: Infarction Location:No core infarct detected. 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 Carious dentition. Ankylosis continues into the upper thoracic spine. No acute osseous abnormality identified. Upper chest: Negative. Other neck: No acute finding. Aortic arch: Tortuous aortic arch with calcified atherosclerosis. Three vessel arch configuration. Right carotid system: Brachiocephalic tortuosity and calcified plaque without stenosis. Tortuous proximal right CCA without stenosis. Calcified distal right CCA and right ICA origin and bulb plaque. Stenosis is up to 50 % with respect to the distal vessel at the distal right ICA bulb. Left carotid system: Calcified plaque at the left CCA origin without stenosis. Tortuous proximal left CCA. Mild calcified plaque at the distal left CCA and left ICA origin. Moderate calcified plaque at the distal ICA bulb with stenosis up to 65-70 % with respect to the distal vessel (series 5, image 109). The left ICA remains patent to the skull base. Vertebral arteries: Tortuous proximal right subclavian artery with abundant calcified plaque. The right vertebral artery origin remains patent without stenosis. Non dominant right vertebral artery is patent through the V2 segment with only mild plaque. Decreasing enhancement of the right vertebral artery V3 segment which is calcified to the skull base, although without high-grade stenosis. See intracranial findings below. Tortuous and calcified proximal left subclavian artery without significant stenosis. However, calcified plaque at the left vertebral artery origin results in high-grade stenosis  (series 8, image 181). The left vertebral artery is dominant. Tortuous left V1 segment. Additional calcified left V2 segment plaque although without high-grade stenosis. Decreasing distal left vertebral artery enhancement compared to the carotids. V3 calcified plaque, although no additional high-grade stenosis to the skull base. CTA HEAD Posterior circulation: Non dominant distal right vertebral artery with calcified but patent early origin of the right PICA. Distal to PICA V4 calcified plaque results in severe stenoses (series 6, image 319, 315) but the right vertebral remains patent to the vertebrobasilar junction. Dominant left V4 segment is heavily calcified and also demonstrates moderate to severe stenosis on series 6, image 310. But the vessel remains patent to the vertebrobasilar junction. Dolichoectatic and calcified basilar artery with considerably decreased enhancement compared to the ICA siphons, although the vessel does appear to remain patent (mid basilar lumen 111 Hounsfield units versus noncontrast CT 55 Hounsfield units earlier today). The basilar tip appears patent as well. PCA origins appear patent. SCA and AICA vessels not well visualized. There is a fetal type left PCA. Left PCA branches appear patent with mild to moderate irregularity. Asymmetrically diminished right PCA enhancement although no definite right PCA P1 or P2 occlusion. No CTA spot sign is evident in the ventral pontine hemorrhage. Anterior circulation: Dolichoectatic and calcified ICA siphons are patent with considerably more enhancement than the basilar artery. No ICA siphon stenosis despite the atherosclerosis. Patent carotid termini, MCA and ACA origins. Tortuous A1 segments. Normal anterior communicating artery. Bilateral ACA branches are within normal limits. Left MCA M1 segment and bifurcation are patent without stenosis. Right MCA M1 segment and bifurcation are patent  without stenosis. There is mild calcified bilateral M1  atherosclerosis. No MCA branch occlusion is identified. Venous sinuses: Early contrast timing, not evaluated. Anatomic variants: Dominant left vertebral artery. Fetal type left PCA origin. Review of the MIP images confirms the above findings IMPRESSION: 1. Poorly enhancing dolichoectatic Basilar Artery, although the vessel does appear to remain patent. 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 This was communicated to Dr. Otelia Limes at [phone] hours by text page via the [facility] messaging system. 2. Subsequently, there is confluent abnormal posterior circulation T-max >6s on CTP, with sparing the Left PCA territory on the basis of a fetal PCA origin on that side. No infarct core detected on CTP. 3. No CTA spot sign detected in the acute ventral pontine hemorrhage. 4. Bilateral calcified carotid artery atherosclerosis, 65-70% stenosis at the Left ICA bulb. No hemodynamically significant stenosis on the right. Dolichoectatic ICA siphons. 5.  Aortic Atherosclerosis (ICD10-I70.0). Electronically Signed   By: Odessa Fleming M.D.   On: 06/10/2020 07:27   Chest Port 1 View  Result Date: 06/10/2020 CLINICAL DATA:  79 year old male code stroke presentation, pontine hemorrhage and decreased posterior circulation perfusion. EXAM: PORTABLE CHEST 1 VIEW COMPARISON:  CT, CTA and CTP head today. Chest radiographs 06/28/2017 and earlier. FINDINGS: Portable AP semi upright view at 0730 hours. Mildly lower lung volumes. Stable cardiac size and mediastinal contours. Chronic thoracic aortic endograft. Allowing for portable technique the lungs are clear. No pneumothorax or pleural effusion. No acute osseous abnormality identified. IMPRESSION: 1.  No acute cardiopulmonary abnormality. 2. Chronic thoracic Aortic Endograft. Electronically Signed   By: Odessa Fleming M.D.   On: 06/10/2020 07:51   DG  Swallowing Func-Speech Pathology  Result Date: 06/11/2020 Objective Swallowing Evaluation: Type of Study: Bedside Swallow Evaluation  Patient Details Name: Lynn Sissel MRN: [id number] Date of Birth: 1941-03-27 Today's Date: 06/11/2020 Time: SLP Start Time (ACUTE ONLY): 1137 -SLP Stop Time (ACUTE ONLY): 1155 SLP Time Calculation (min) (ACUTE ONLY): 18 min Past Medical History: Past Medical History: Diagnosis Date  Abdominal aortic aneurysm (HCC)   status post endoluminal stent graft at [facility] in 2006  Abdominal aortic aneurysm (HCC) 2006  Arthritis   Coronary artery disease   GERD (gastroesophageal reflux disease)   History of shingles   TIMES 2  Hyperlipidemia   Hypertension   MRSA (methicillin resistant Staphylococcus aureus) infection   Numbness   right leg from knee down worse when standing   PONV (postoperative nausea and vomiting)   Sleep apnea   pt scored 5 per Stop Bang tool at PAT visit 10/26/2015; results sent to Mauricio Po NP  Vertigo  Past Surgical History: Past Surgical History: Procedure Laterality Date  ABDOMINAL AORTIC ANEURYSM REPAIR W/ ENDOLUMINAL GRAFT    13 years ago   ANGIOPLASTY    branch stenting  06/08/2004  Dr. Lavonne Chick  CARDIAC CATHETERIZATION  06/08/2004  CARDIOVASCULAR STRESS TEST  02/14/2009  I&D of left knee     LUMBAR LAMINECTOMY/DECOMPRESSION MICRODISCECTOMY N/A 11/02/2015  Procedure: MICROLUMBAR DECOMPRESSION L3-L4, L4-L5, AND L5-S1;  Surgeon: Jene Every, MD;  Location: [facility];  Service: Orthopedics;  Laterality: N/A;  PICC LINE PLACE PERIPHERAL ([facility] HX)    TRANSTHORACIC ECHOCARDIOGRAM  06/07/2004 HPI: 79 y.o. male with atrial fibrillation on Eliquis, presenting with an acute anterior midline pontine hemorrhage  Neuro report includes  severe dysarthria, right facial droop and right hemiplegia.  Subjective: Pt awake, alert, pleasant, participative Assessment / Plan / Recommendation CHL IP CLINICAL IMPRESSIONS 06/11/2020 Clinical Impression Pt presents with moderate  oropharyngeal dysphagia c/b decreased lingual strength and coordination, premature spillage, delayed swallo initiation, incomplete laryngeal closure and diminished sensation.  These deficits led to aspiration of thin and nectar thick liquid before and during the swallow with variable cough response.  Aspiration of thin liquid occurred at posterior trachea prior to the swallow 2/2 penetration of bolus through interarytenoid space which traveled on the vocal folds to the anterior portion of the trachea.  With nectar thick liquid by cup, initially only transient penetration was observed but as study progressed, pt aspirated a significant amount of bolus prior to the swallow.  By straw, there was penetration of nectar thick liquid to the level of the vocal folds with aspiration during the swallow.  Cough response was inconsistent and significantly delayed.  Cough was ineffective to clear aspiration and penetration whether cued or reflexive.  There was no penetration or aspiration of honey thick liquid or solids.  There was prolonged oral phase with puree and solid textuers which increased with complexity of solid. With pill simulation there was prompt oral transit of tablet with honey thick liquid and no penetration or aspiraiton.  Esophageal transit of tablet appears to be [facility] as it could not be located on esphageal sweep.   Recommend chopped/ground diet with honey thick liquids. SLP Visit Diagnosis Dysphagia, oropharyngeal phase (R13.12) Attention and concentration deficit following -- Frontal lobe and executive function deficit following -- Impact on safety and function Moderate aspiration risk   CHL IP TREATMENT RECOMMENDATION 06/11/2020 Treatment Recommendations Therapy as outlined in treatment plan below   Prognosis 06/11/2020 Prognosis for Safe Diet Advancement Good Barriers to Reach Goals -- Barriers/Prognosis Comment -- CHL IP DIET RECOMMENDATION 06/11/2020 SLP Diet Recommendations Dysphagia 2 (Fine chop)  solids;Honey thick liquids Liquid Administration via Cup;No straw Medication Administration Whole meds with liquid Compensations Slow rate;Small sips/bites;Follow solids with liquid;Lingual sweep for clearance of pocketing Postural Changes Seated upright at 90 degrees   CHL IP OTHER RECOMMENDATIONS 06/11/2020 Recommended Consults -- Oral Care Recommendations Oral care BID Other Recommendations --   CHL IP FOLLOW UP RECOMMENDATIONS 06/11/2020 Follow up Recommendations Inpatient Rehab   CHL IP FREQUENCY AND DURATION 06/11/2020 Speech Therapy Frequency (ACUTE ONLY) min 2x/week Treatment Duration 2 weeks      CHL IP ORAL PHASE 06/11/2020 Oral Phase Impaired Oral - Pudding Teaspoon -- Oral - Pudding Cup -- Oral - Honey Teaspoon -- Oral - Honey Cup Premature spillage Oral - Nectar Teaspoon -- Oral - Nectar Cup Premature spillage Oral - Nectar Straw Premature spillage Oral - Thin Teaspoon -- Oral - Thin Cup Premature spillage Oral - Thin Straw -- Oral - Puree Decreased bolus cohesion;Premature spillage Oral - Mech Soft Lingual/palatal residue;Lingual pumping Oral - Regular Decreased bolus cohesion;Premature spillage;Piecemeal swallowing;Lingual/palatal residue;Lingual pumping;Reduced posterior propulsion Oral - Multi-Consistency -- Oral - Pill WFL Oral Phase - Comment --  CHL IP PHARYNGEAL PHASE 06/11/2020 Pharyngeal Phase Impaired Pharyngeal- Pudding Teaspoon -- Pharyngeal -- Pharyngeal- Pudding Cup -- Pharyngeal -- Pharyngeal- Honey Teaspoon -- Pharyngeal -- Pharyngeal- Honey Cup WFL Pharyngeal -- Pharyngeal- Nectar Teaspoon -- Pharyngeal -- Pharyngeal- Nectar Cup Delayed swallow initiation-pyriform sinuses;Reduced airway/laryngeal closure;Penetration/Aspiration before swallow;Penetration/Aspiration during swallow;Moderate aspiration Pharyngeal Material enters airway, passes BELOW cords without attempt by patient to eject out (silent aspiration) Pharyngeal- Nectar Straw Delayed swallow initiation-pyriform sinuses;Reduced  airway/laryngeal closure;Penetration/Aspiration before swallow;Penetration/Aspiration during swallow Pharyngeal Material enters airway, passes BELOW cords and  not ejected out despite cough attempt by patient Pharyngeal- Thin Teaspoon -- Pharyngeal -- Pharyngeal- Thin Cup Delayed swallow initiation-pyriform sinuses;Penetration/Aspiration before swallow Pharyngeal Material enters airway, passes BELOW cords and not ejected out despite cough attempt by patient Pharyngeal- Thin Straw -- Pharyngeal -- Pharyngeal- Puree WFL Pharyngeal Material does not enter airway Pharyngeal- Mechanical Soft WFL Pharyngeal Material does not enter airway Pharyngeal- Regular WFL Pharyngeal Material does not enter airway Pharyngeal- Multi-consistency -- Pharyngeal -- Pharyngeal- Pill Delayed swallow initiation-vallecula Pharyngeal Material does not enter airway Pharyngeal Comment --  CHL IP CERVICAL ESOPHAGEAL PHASE 06/11/2020 Cervical Esophageal Phase WFL Pudding Teaspoon -- Pudding Cup -- Honey Teaspoon -- Honey Cup -- Nectar Teaspoon -- Nectar Cup -- Nectar Straw -- Thin Teaspoon -- Thin Cup -- Thin Straw -- Puree -- Mechanical Soft -- Regular -- Multi-consistency -- Pill -- Cervical Esophageal Comment -- Kerrie Pleasure, MA, CCC-SLP Acute Rehabilitation Services Office: [phone] 06/11/2020, 1:02 PM              ECHOCARDIOGRAM COMPLETE  Result Date: 06/10/2020    ECHOCARDIOGRAM REPORT   Patient Name:   STEPEHN ECKARD Date of Exam: 06/10/2020 Medical Rec #:  [id number]   Height:       74.0 in Accession #:    [id number]  Weight:       218.3 lb Date of Birth:  01/30/1941  BSA:          2.256 m Patient Age:    78 years    BP:           135/91 mmHg Patient Gender: M           HR:           82 bpm. Exam Location:  Inpatient Procedure: 2D Echo, Cardiac Doppler and Color Doppler Indications:    Stroke  History:        Patient has prior history of Echocardiogram examinations, most                 recent 09/14/2019. Arrythmias:Atrial  Fibrillation; Risk                 Factors:Dyslipidemia and Hypertension.  Sonographer:    Ross Ludwig RDCS (AE) Referring Phys: [id number] [facility]  Sonographer Comments: No subcostal window. IMPRESSIONS  1. Left ventricular ejection fraction, by estimation, is 60 to 65%. The left ventricle has normal function. The left ventricle has no regional wall motion abnormalities. There is moderate left ventricular hypertrophy. Left ventricular diastolic function  could not be evaluated.  2. Right ventricular systolic function is normal. The right ventricular size is normal. Tricuspid regurgitation signal is inadequate for assessing PA pressure.  3. Left atrial size was mild to moderately dilated.  4. The mitral valve is normal in structure. Trivial mitral valve regurgitation.  5. The aortic valve is tricuspid. There is mild calcification of the aortic valve. There is mild thickening of the aortic valve. Aortic valve regurgitation is mild. Mild aortic valve stenosis.  6. Aortic dilatation noted. There is mild dilatation of the ascending aorta, measuring 42 mm. Comparison(s): No significant change from prior study. Conclusion(s)/Recommendation(s): No intracardiac source of embolism detected on this transthoracic study. A transesophageal echocardiogram is recommended to exclude cardiac source of embolism if clinically indicated. FINDINGS  Left Ventricle: Left ventricular ejection fraction, by estimation, is 60 to 65%. The left ventricle has normal function. The left ventricle has no regional wall motion abnormalities. The left ventricular internal cavity size was normal in size. There  is  moderate left ventricular hypertrophy. Left ventricular diastolic function could not be evaluated due to atrial fibrillation. Left ventricular diastolic function could not be evaluated. Right Ventricle: The right ventricular size is normal. Right vetricular wall thickness was not well visualized. Right ventricular systolic function is normal.  Tricuspid regurgitation signal is inadequate for assessing PA pressure. Left Atrium: Left atrial size was mild to moderately dilated. Right Atrium: Right atrial size was normal in size. Pericardium: There is no evidence of pericardial effusion. Presence of pericardial fat pad. Mitral Valve: The mitral valve is normal in structure. Trivial mitral valve regurgitation. Tricuspid Valve: The tricuspid valve is normal in structure. Tricuspid valve regurgitation is trivial. No evidence of tricuspid stenosis. Aortic Valve: The aortic valve is tricuspid. There is mild calcification of the aortic valve. There is mild thickening of the aortic valve. Aortic valve regurgitation is mild. Aortic regurgitation PHT measures 604 msec. Mild aortic stenosis is present. Aortic valve mean gradient measures 7.7 mmHg. Aortic valve peak gradient measures 13.9 mmHg. Aortic valve area, by VTI measures 1.95 cm. Pulmonic Valve: The pulmonic valve was not well visualized. Pulmonic valve regurgitation is not visualized. Aorta: Aortic dilatation noted. There is mild dilatation of the ascending aorta, measuring 42 mm. Venous: The inferior vena cava was not well visualized. IAS/Shunts: The atrial septum is grossly normal.  LEFT VENTRICLE PLAX 2D LVIDd:         4.80 cm LVIDs:         3.10 cm LV PW:         1.70 cm LV IVS:        1.90 cm LVOT diam:     2.20 cm LV SV:         58 LV SV Index:   26 LVOT Area:     3.80 cm  RIGHT VENTRICLE RV Basal diam:  2.90 cm RV S prime:     8.81 cm/s TAPSE (M-mode): 1.4 cm LEFT ATRIUM             Index       RIGHT ATRIUM           Index LA diam:        4.80 cm 2.13 cm/m  RA Area:     15.40 cm LA Vol (A2C):   70.0 ml 31.03 ml/m RA Volume:   28.90 ml  12.81 ml/m LA Vol (A4C):   66.8 ml 29.61 ml/m LA Biplane Vol: 69.9 ml 30.99 ml/m  AORTIC VALVE AV Area (Vmax):    1.74 cm AV Area (Vmean):   1.78 cm AV Area (VTI):     1.95 cm AV Vmax:           186.33 cm/s AV Vmean:          128.667 cm/s AV VTI:             0.299 m AV Peak Grad:      13.9 mmHg AV Mean Grad:      7.7 mmHg LVOT Vmax:         85.50 cm/s LVOT Vmean:        60.267 cm/s LVOT VTI:          0.153 m LVOT/AV VTI ratio: 0.51 AI PHT:            604 msec  AORTA Ao Root diam: 3.90 cm Ao Asc diam:  4.20 cm  SHUNTS Systemic VTI:  0.15 m Systemic Diam: 2.20 cm Jodelle Red MD Electronically signed by Hughie Closs  Cristal Deer MD Signature Date/Time: 06/10/2020/8:24:31 PM    Final    CT HEAD CODE STROKE WO CONTRAST  Result Date: 06/10/2020 CLINICAL DATA:  Code stroke. 79 year old male with right side weakness and slurred speech. EXAM: CT HEAD WITHOUT CONTRAST TECHNIQUE: Contiguous axial images were obtained from the base of the skull through the vertex without intravenous contrast. COMPARISON:  Head CT 06/11/2014 [facility] FINDINGS: Brain: Generalized cerebral volume loss since 2015. Mild ex vacuo ventricular enlargement now. Oval up to 13 mm hyperdensity throughout the ventral lower pons best seen on sagittal image 31 of series 8 most resembles an acute intra-axial hemorrhage (series 4, image 11). No significant edema or regional mass effect at this time. Estimated blood volume is 1 mL. No extra-axial or intraventricular blood identified. No posterior fossa mass effect. Supratentorial Patchy and confluent bilateral cerebral white matter hypodensity and heterogeneity in the bilateral deep gray nuclei, including a chronic left thalamic lacune which was present in 2015 (series 4, image 18). No cortically based acute infarct identified. 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 Skull: No acute osseous abnormality identified. Sinuses/Orbits: Visualized paranasal sinuses and mastoids are stable and well pneumatized. Other: No acute orbit or scalp soft tissue finding. ASPECTS [facility] Stroke Program Early CT Score) Total score (0-10 with 10 being normal): 10, but note acute brainstem hemorrhage. IMPRESSION: 1.  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 No mass effect, extra-axial- or intraventricular extension of blood at this time. 2. 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 3. These results were communicated to Dr. Otelia Limes at 6:25 am on 06/10/2020 by text page via the [facility] messaging system. Electronically Signed   By: Odessa Fleming M.D.   On: 06/10/2020 06:26       HISTORY OF PRESENT ILLNESS Haji Delaine is an 79 y.o. male with a history of atrial fibrillation (on Eliquis), AAA s/p stent graft in 2006, CAD, shingles x 2, HLD, HTN, sleep apnea and vertigo who presents from home as a Code Stroke for acute onset of right sided weakness. LKW 06/10/2020 at 11 PM when he went to bed. At 5 AM he tried to get out of bed and fell. Wife noted that he was not acting right and that his speech was abnormal. She called EMS. On arrival to the ED he had severe weakness of his RUE and RLE, with facial droop and severe dysarthria. His last dose of Eliquis was at 8 AM yesterday; he missed his evening dose.  CT head shows an acute hemorrhage in the ventral pons. ICH score: 1   HOSPITAL COURSE Mr. Prem Coykendall is a 79 y.o. male with history of  atrial fibrillation (on Eliquis - but missed last evening dose), AAA s/p stent graft in 2006, CAD, shingles x 2, HLD, HTN, sleep apnea and vertigo presenting with severe Rt sided weakness, facial droop and severe dysarthria.    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 No mass effect, extra-axial- or intraventricular extension of blood at this time. 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 Hemodynamically significant bilateral vertebral artery stenoses (bilateral distal V4 and also dominant Left vertebral origin). Bilateral calcified carotid artery atherosclerosis, 65-70% stenosis at the  Left ICA bulb. MRI head ventral pontine acute hemorrhage 14 mm.  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 2D Echo - EF 60 - 65%. No cardiac source of emboli identified.  Sars Corona Virus 2 - negative LDL - 84 HgbA1c - 6.1 UDS - neg VTE prophylaxis - SCDs Eliquis (apixaban) daily prior to admission, now on No antithrombotic Ongoing aggressive stroke risk factor management. Given Amyloid and ICH, anticoagulation not recommended Therapy recommendations: CIR Disposition:  Pending   Chronic afib On eliquis PTA Received Kcentra reversal  Now on no antithrombotics Given MRI showing numerous chronic microhemorrhages bilaterally, concerning for hypertensive versus cerebral amyloid, likely not to be future anticoagulation candidate   Hypertension Home BP meds: metoprolol ; amlodipine Treated w/ Cleviprex, now off BP slightly elevated Current BP meds:  Metoprolol 25 bid->50 bid and amlodipine 10  SBP goal < 160 mm Hg Long-term BP goal normotensive   Hyperlipidemia Home Lipid lowering medication: none  LDL 84, goal < 70 Hold off statin given current ICH Consider statin at discharge   Dysphagia Did not pass swallow NPO -> 12/18 - SLP Diet Recommendations: Dysphagia 2 (Fine chop) solids;Honey thick liquids  IVF @ 50 Speech to follow   Other Stroke Risk Factors Advanced age Coronary artery disease Obstructive sleep apnea AAA stat post stenting in 2006   Other Active Problems, Findings and Recommendations Code status - Full Code Aortic Atherosclerosis (ICD10-I70.0)  Hypokalemia - resolved Mild thrombocytopenia - platelets - 149    DISCHARGE EXAM Blood pressure (!)  156/91, pulse 77, temperature 98.9 F (37.2 C), temperature source Axillary, resp. rate 20, height 6\' 2"  (1.88 m), weight 96.2 kg, SpO2 94 %. Pleasant elderly Caucasian male not in distress..  . Afebrile. Head is nontraumatic. Neck is supple without bruit.    Cardiac exam no murmur or gallop. Lungs are clear to auscultation. Distal pulses are well felt. Neurological Exam : Awake alert oriented to time place and person.  Severe dysarthria and can be understood with some difficulty.  Able to name repeat well.  Extraocular movements are full range without nystagmus.  Blinks to threat bilaterally.  Tongue midline.  Motor system exam shows dense right hemiplegia with 0/5 right upper and 2/5 right lower extremity strength.  Normal antigravity movements on the left side.  Sensation appears preserved bilaterally.  Gait not tested.  Discharge Diet  Dysphagia 2 Honey thick liquids  DISCHARGE PLAN Disposition:  Transfer to [facility] for ongoing PT, OT and ST Due to hemorrhage and risk of bleeding, do not take aspirin, aspirin-containing medications, or ibuprofen products  Recommend ongoing stroke risk factor control by Primary Care Physician at time of discharge from inpatient rehabilitation. Follow-up PCP Mauricio Po F, NP in 2 weeks following discharge from rehab. Follow-up in [facility] in 4 weeks following discharge from rehab, office to schedule an appointment.   35 minutes were spent preparing discharge.  Annie Main, MSN, APRN, ANVP-BC, AGPCNP-BC Advanced Practice Stroke Nurse [facility] See Amion for Schedule & Pager information 06/16/2020 2:42 PM    I have personally obtained history,examined this patient, reviewed notes, independently viewed imaging studies, participated in medical decision making and plan of care.ROS completed by me personally and pertinent positives fully documented  I have made any additions or  clarifications directly to the above note. Agree with note above. Consider possible participation in ASPIRE trial for stroke  prevention  Delia Heady, MD Medical Director [facility] Pager: [phone] 06/16/2020 4:10 PM

## 2020-06-16 NOTE — Progress Notes (Signed)
Inpatient Rehabilitation Admissions Coordinator  I have CIR bed to admit patient to today. I met with patient at bedside with his grandson, contacted his wife by phone as well as Dr. Leonie Man. We will arrange admit to Cir today.  Danne Baxter, RN, MSN Rehab Admissions Coordinator [phone] 06/16/2020 11:59 AM

## 2020-06-16 NOTE — H&P (Signed)
Physical Medicine and Rehabilitation Admission H&P  CC: [facility]  HPI: Khyre Germond is a 79 year old right-handed male with history of atrial fibrillation maintained on Eliquis, AAA status post stent graft 2006, CAD status post stenting, hypertension hyperlipidemia and OSA.  Per chart review lives with spouse 1 level home 2 steps to entry.  Independent prior to admission with intermittent use of a straight point cane working as a Games developer.  Presented 06/10/2020 with acute onset of right side weakness and dysarthria.  Admission chemistries unremarkable except potassium 3.4, glucose 128, hemoglobin A1c 6.1 troponin negative, alcohol/urine drug screen negative.  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  No mass-effect.  CT angiogram of head and neck bilateral calcified carotid artery atherosclerosis 65 to 70% stenosis of the left ICA bulb.  No hemodynamically significant stenosis on the right.  Suspect severely delayed basilar artery perfusion on the basis of hemodynamically significant bilateral vertebral artery stenosis.  MRI showed 14 mm acute hemorrhage in the left ventral pons without underlying mass or change from prior tracing.  Admission chemistries urine drug screen negative troponin negative MRSA PCR screening positive.  Echocardiogram with ejection fraction 60 to 65% no wall motion abnormalities.  Initially placed on hypertonic saline 3%.  Eliquis has been discontinued due to ICH.  Cleviprex initiated for blood pressure control.  Dysphagia #2 honey thick liquids.  Due to patient's acute right side weakness dysarthria he was admitted for a comprehensive rehab program.  Review of Systems  Constitutional: Negative for chills and fever.  HENT: Negative for hearing loss.   Eyes: Negative for blurred vision and double vision.  Respiratory: Negative for cough and shortness of breath.   Cardiovascular: Positive for palpitations and leg swelling.  Gastrointestinal:  Positive for constipation. Negative for heartburn, nausea and vomiting.       GERD  Genitourinary: Positive for urgency. Negative for dysuria, flank pain and hematuria.  Musculoskeletal: Positive for joint pain and myalgias.  Neurological:       Vertigo  All other systems reviewed and are negative.  Past Medical History:  Diagnosis Date  . Abdominal aortic aneurysm [facility])    status post endoluminal stent graft at [facility] in 2006  . Abdominal aortic aneurysm (HCC) 2006  . Arthritis   . Coronary artery disease   . GERD (gastroesophageal reflux disease)   . History of shingles    TIMES 2  . Hyperlipidemia   . Hypertension   . MRSA (methicillin resistant Staphylococcus aureus) infection   . Numbness    right leg from knee down worse when standing   . PONV (postoperative nausea and vomiting)   . Sleep apnea    pt scored 5 per Stop Bang tool at PAT visit 10/26/2015; results sent to [facility] NP  . Vertigo    Past Surgical History:  Procedure Laterality Date  . ABDOMINAL AORTIC ANEURYSM REPAIR W/ ENDOLUMINAL GRAFT     13 years ago   . ANGIOPLASTY    . branch stenting  06/08/2004   Dr. Lavonne Chick  . CARDIAC CATHETERIZATION  06/08/2004  . CARDIOVASCULAR STRESS TEST  02/14/2009  . I&D of left knee     . LUMBAR LAMINECTOMY/DECOMPRESSION MICRODISCECTOMY N/A 11/02/2015   Procedure: MICROLUMBAR DECOMPRESSION L3-L4, L4-L5, AND L5-S1;  Surgeon: Jene Every, MD;  Location: [facility];  Service: Orthopedics;  Laterality: N/A;  . PICC LINE PLACE PERIPHERAL ([facility] HX)    . TRANSTHORACIC ECHOCARDIOGRAM  06/07/2004   Family History  Problem Relation Age of Onset  . Alzheimer's disease Sister   . Heart failure Maternal Grandmother   . Heart failure Maternal Grandfather   . Heart failure Paternal Grandmother   . Heart failure Paternal Grandfather   . Cancer Sister    Social History:  reports that he has never smoked. He has never used smokeless tobacco. He reports that he does not  drink alcohol and does not use drugs. Allergies:  Allergies  Allergen Reactions  . Crestor [Rosuvastatin] Other (See Comments)    Severe pain  . Lipitor [Atorvastatin] Other (See Comments)    Severe pain   Medications Prior to Admission  Medication Sig Dispense Refill  . amLODipine (NORVASC) 10 MG tablet TAKE 1 TABLET(10 MG) BY MOUTH DAILY (Patient taking differently: Take 10 mg by mouth daily.) 90 tablet 3  . insulin aspart (NOVOLOG) 100 UNIT/ML injection Inject 0-15 Units into the skin every 4 (four) hours. 10 mL 11  . metoprolol tartrate (LOPRESSOR) 50 MG tablet Take 1 tablet (50 mg total) by mouth 2 (two) times daily.    . pantoprazole (PROTONIX) 40 MG tablet Take 1 tablet (40 mg total) by mouth at bedtime.    . senna-docusate (SENOKOT-S) 8.6-50 MG tablet Take 1 tablet by mouth 2 (two) times daily.    . sodium chloride 0.9 % infusion Inject 50 mLs into the vein continuous.  0    Drug Regimen Review Drug regimen was reviewed and remains appropriate with no significant issues identified    Home: Home Living Family/patient expects to be discharged to:: Private residence Living Arrangements: Spouse/significant other,Children Available Help at Discharge: Family Type of Home: House Home Access: Stairs to enter Secretary/administrator of Steps: 2 Entrance Stairs-Rails: None Home Layout: One level Bathroom Shower/Tub: Engineer, manufacturing systems: Handicapped height Bathroom Accessibility: Yes Home Equipment: Environmental consultant - 4 wheels,Cane - single point,Shower seat  Lives With: Spouse,Son   Functional History: Prior Function Level of Independence: Independent Comments: pt driving, working as a Retail buyer, independent with intermittent use of SPC  Functional Status:  Mobility: Bed Mobility Overal bed mobility: Needs Assistance Bed Mobility: Supine to Sit,Sit to Supine Supine to sit: Max assist,+2 for physical assistance,+2 for safety/equipment Sit to supine: Max assist,+2  for physical assistance,+2 for safety/equipment General bed mobility comments: max +2 for trunk and LE management, step-wise LE translation into and out of bed, boost up in bed upon return to supine. Increased time and effort. Transfers Overall transfer level: Needs assistance Equipment used: Ambulation equipment used Transfer via Lift Equipment: Stedy Transfers: Sit to/from Stand Sit to Stand: Max assist,+2 physical assistance,+2 safety/equipment General transfer comment: Max +2 for power up, rise, hip extension via posterior facilitation, steadying, placement of RUE on stedy bar. x4 sit to stands from stedy seat, with mulitmodal cuing for upright chest, hip extension to reach full standing. Standing tolerance ~15 seconds before fatiguing. deferred OOB to recliner as pt tranferring floors shortly after session Ambulation/Gait General Gait Details: NT- not placed in chair due to pt transfer orders  ADL: ADL Overall ADL's : Needs assistance/impaired Eating/Feeding: NPO Grooming: Set up,Bed level,Wash/dry face,Supervision/safety Upper Body Bathing: Maximal assistance,Sitting Lower Body Bathing: Total assistance,+2 for physical assistance,+2 for safety/equipment,Sit to/from stand,Bed level Upper Body Dressing : Maximal assistance,Sitting Upper Body Dressing Details (indicate cue type and reason): donning new gown Lower Body Dressing: Total assistance,+2 for safety/equipment,+2 for physical assistance,Sit to/from stand,Bed level Lower Body Dressing Details (indicate cue type and reason): assist for socks; maxA+2 for sit<>stand Toileting- Clothing  Manipulation and Hygiene: Total assistance,+2 for physical assistance,+2 for safety/equipment,Sit to/from stand Toileting - Clothing Manipulation Details (indicate cue type and reason): assist to stand with PT/OT while RN assisting with pericare Functional mobility during ADLs: Maximal assistance,+2 for physical assistance,+2 for safety/equipment  (sit<>stand at [facility])  Cognition: Cognition Overall Cognitive Status: Difficult to assess Arousal/Alertness: Awake/alert Orientation Level: Oriented X4 Attention: Sustained Sustained Attention: Appears intact Memory: Appears intact Awareness: Appears intact Behaviors: Lability Cognition Arousal/Alertness: Awake/alert,Lethargic Behavior During Therapy: Flat affect Overall Cognitive Status: Difficult to assess Area of Impairment: Attention,Memory,Following commands,Awareness,Problem solving Current Attention Level: Sustained Memory: Decreased short-term memory Following Commands: Follows one step commands with increased time Awareness: Emergent Problem Solving: Slow processing,Decreased initiation,Requires verbal cues,Requires tactile cues General Comments: A&Ox4, tearful when discussing careeer, wife, and family due to new deficits. Follows commands with increased time and to the best of his abilities. Pt communication limited by dysarthric speech Difficult to assess due to: Impaired communication  Physical Exam: Blood pressure (!) 152/89, pulse 88, resp. rate (!) 24, weight 93.8 kg, SpO2 95 %. Physical Exam General: Alert, No apparent distress HEENT: Head is normocephalic, atraumatic, PERRLA, EOMI, sclera anicteric, oral mucosa pink and moist, dentition intact, ext ear canals clear,  Neck: Supple without JVD or lymphadenopathy Heart: Reg rate and rhythm. No murmurs rubs or gallops Chest: CTA bilaterally without wheezes, rales, or rhonchi; no distress Abdomen: Soft, non-tender, non-distended, bowel sounds positive. Extremities: No clubbing, cyanosis, or edema. Pulses are 2+ Skin: Clean and intact without signs of breakdown Neuro: Patient is alert makes eye contact with examiner.  Severely dysarthric but intelligible.  Follows commands.  Right facial droop. RUE 0/5, RLE 2/5, left side 4+/5.  Psych: Pt's affect is appropriate. Pt is cooperative   Results for orders placed or  performed during the hospital encounter of 06/10/20 (from the past 48 hour(s))  Glucose, capillary     Status: Abnormal   Collection Time: 06/14/20  9:06 PM  Result Value Ref Range   Glucose-Capillary 149 (H) 70 - 99 mg/dL    Comment: Glucose reference range applies only to samples taken after fasting for at least 8 hours.  Glucose, capillary     Status: None   Collection Time: 06/15/20 12:15 AM  Result Value Ref Range   Glucose-Capillary 89 70 - 99 mg/dL    Comment: Glucose reference range applies only to samples taken after fasting for at least 8 hours.   Comment 1 Notify RN    Comment 2 Document in Chart   CBC     Status: Abnormal   Collection Time: 06/15/20  2:32 AM  Result Value Ref Range   WBC 8.2 4.0 - 10.5 K/uL   RBC 4.83 4.22 - 5.81 MIL/uL   Hemoglobin 14.9 13.0 - 17.0 g/dL   HCT 16.1 09.6 - 04.5 %   MCV 88.8 80.0 - 100.0 fL   MCH 30.8 26.0 - 34.0 pg   MCHC 34.7 30.0 - 36.0 g/dL   RDW 40.9 81.1 - 91.4 %   Platelets 149 (L) 150 - 400 K/uL   nRBC 0.0 0.0 - 0.2 %    Comment: Performed at [facility], [address] [place], [place] [address]  Basic metabolic panel     Status: Abnormal   Collection Time: 06/15/20  2:32 AM  Result Value Ref Range   Sodium 138 135 - 145 mmol/L   Potassium 3.8 3.5 - 5.1 mmol/L   Chloride 107 98 - 111 mmol/L   CO2 22  22 - 32 mmol/L   Glucose, Bld 111 (H) 70 - 99 mg/dL    Comment: Glucose reference range applies only to samples taken after fasting for at least 8 hours.   BUN 15 8 - 23 mg/dL   Creatinine, Ser 7.20 0.61 - 1.24 mg/dL   Calcium 8.7 (L) 8.9 - 10.3 mg/dL   GFR, Estimated >94 >70 mL/min    Comment: (NOTE) Calculated using the CKD-EPI Creatinine Equation (2021)    Anion gap 9 5 - 15    Comment: Performed at [facility], [address], [place], [place] [address]  Glucose, capillary     Status: Abnormal   Collection Time: 06/15/20  3:47 AM  Result Value Ref Range   Glucose-Capillary 132 (H) 70 - 99 mg/dL     Comment: Glucose reference range applies only to samples taken after fasting for at least 8 hours.   Comment 1 Notify RN    Comment 2 Document in Chart   Glucose, capillary     Status: Abnormal   Collection Time: 06/15/20  9:20 AM  Result Value Ref Range   Glucose-Capillary 128 (H) 70 - 99 mg/dL    Comment: Glucose reference range applies only to samples taken after fasting for at least 8 hours.  Glucose, capillary     Status: Abnormal   Collection Time: 06/15/20 12:36 PM  Result Value Ref Range   Glucose-Capillary 169 (H) 70 - 99 mg/dL    Comment: Glucose reference range applies only to samples taken after fasting for at least 8 hours.   Comment 1 Notify RN    Comment 2 Document in Chart   Glucose, capillary     Status: Abnormal   Collection Time: 06/15/20  4:05 PM  Result Value Ref Range   Glucose-Capillary 137 (H) 70 - 99 mg/dL    Comment: Glucose reference range applies only to samples taken after fasting for at least 8 hours.   Comment 1 Notify RN    Comment 2 Document in Chart   Glucose, capillary     Status: None   Collection Time: 06/15/20  7:24 PM  Result Value Ref Range   Glucose-Capillary 98 70 - 99 mg/dL    Comment: Glucose reference range applies only to samples taken after fasting for at least 8 hours.  Glucose, capillary     Status: Abnormal   Collection Time: 06/15/20 11:46 PM  Result Value Ref Range   Glucose-Capillary 115 (H) 70 - 99 mg/dL    Comment: Glucose reference range applies only to samples taken after fasting for at least 8 hours.  Glucose, capillary     Status: Abnormal   Collection Time: 06/16/20  4:03 AM  Result Value Ref Range   Glucose-Capillary 133 (H) 70 - 99 mg/dL    Comment: Glucose reference range applies only to samples taken after fasting for at least 8 hours.  Glucose, capillary     Status: None   Collection Time: 06/16/20  8:34 AM  Result Value Ref Range   Glucose-Capillary 98 70 - 99 mg/dL    Comment: Glucose reference range applies  only to samples taken after fasting for at least 8 hours.   Comment 1 Notify RN    Comment 2 Document in Chart   Glucose, capillary     Status: Abnormal   Collection Time: 06/16/20 11:19 AM  Result Value Ref Range   Glucose-Capillary 131 (H) 70 - 99 mg/dL    Comment: Glucose reference range applies  only to samples taken after fasting for at least 8 hours.   Comment 1 Notify RN    Comment 2 Document in Chart    No results found.     Medical Problem List and Plan: 1.  Right side weakness with dysphagia as well as dysarthria secondary to acute inferior pontine hemorrhage likely due to hypertension in the setting of Eliquis use as well significant bilateral vertebral artery stenosis  -patient may shower  -ELOS/Goals: 14-21 days 2.  Antithrombotics: -DVT/anticoagulation: SCDs  -antiplatelet therapy: N/A 3. Pain Management: Continue Tylenol as needed 4. Mood: Provide emotional support  -antipsychotic agents: N/A 5. Neuropsych: This patient is capable of making decisions on his own behalf. 6. Skin/Wound Care: Routine skin checks 7. Fluids/Electrolytes/Nutrition: Routine INO's with follow-up chemistries 8.  Dysphagia.  Dysphagia #2 honey thick liquids.  Monitor hydration.  Follow-up speech therapy 9.  Atrial fibrillation.  Eliquis discontinued due to ICH.  Cardiac rate controlled.  Continue beta-blocker 10.  Hypertension.  Norvasc 10 mg daily. Currently elevated. Increase Lopressor to 25mg  TID. Monitor with increased mobility 11.  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  Contact precautions 12.  Prediabetes.  Hemoglobin A1c 6.1.  SSI. CBGs ranging from 98 to 133. Provide dietary education.  13.  GERD.  Continue Protonix  I have personally performed a face to face diagnostic evaluation, including, but not limited to relevant history and physical exam findings, of this patient and developed relevant assessment and plan.  Additionally, I have reviewed and concur with the physician assistant's  documentation above.  Sula SodaKrutika Neala Miggins, MD  Mcarthur Rossettianiel J Angiulli, PA-C 06/16/2020

## 2020-06-16 NOTE — Progress Notes (Signed)
[name], Sun Wilensky G, RN  Rehab Admission Coordinator  Physical Medicine and Rehabilitation  PMR Pre-admission      Signed  Date of Service:  06/13/2020  1:20 PM      Related encounter: ED to Hosp-Admission (Current) from 06/10/2020 in [facility]       Signed          Show:Clear all [x] Manual[x] Template[x] Copied  Added by: [x] [name], Gavin Berman G, RN[x] [name], [name], CCC-SLP   [] Hover for details  PMR Admission Coordinator Pre-Admission Assessment   Patient: [name] is an 79 y.o., male MRN: [id number] DOB: [birth year] Height: 6\' 2"  (188 cm) Weight: 96.2 kg                                                                                                                                                  Insurance Information HMO:     PPO:      PCP:      IPA:      80/20: yes     OTHER:  PRIMARY: Medicare A & B      Policy#: [id number]      Subscriber: patient Benefits:  Phone #: verified online via [employer] on 06/13/20     Name:  Eff. Date: Part A & B effective 11/24/06     Deduct: $1,484      Out of Pocket Max: NA      Life Max: NA  CIR: 100% with Medicare approval      SNF: 100% (days 1-20), 80% (days 21-100) Outpatient: 80%     Co-Pay: 20% Home Health: 100%      Co-Pay:  DME: 80%     Co-Pay: 20% Providers: pt's choice  SECONDARY:       Policy#:       Phone#:    Artistinancial Counselor:       Phone#:    The Engineer, materials"Data Collection Information Summary" for patients in Inpatient Rehabilitation Facilities with attached "Privacy Act Statement-Health Care Records" was provided and verbally reviewed with: Family   Emergency Contact Information         Contact Information     Name Relation Home Work Mobile    [name],[name] [phone]        [facility],[name]     [phone]       Current Medical History  Patient Admitting Diagnosis: acute inferior pontine ICH   History of Present Illness:  79 y.o. right-handed male with history of  atrial fibrillation maintained on Eliquis, AAA status post stent graft 2006, CAD status post stenting, hypertension, hyperlipidemia, OSA.  Independent prior to admission with intermittent use of straight point cane working as a Games developerdiesel mechanic.  Presented 06/10/2020 with acute onset of right side weakness with dysarthria.  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  No mass-effect.  CT  angiogram of head and neck bilateral calcified carotid artery atherosclerosis 65 to 70% stenosis of the left ICA bulb.  No hemodynamically significant stenosis on the right.  Suspect severely delayed basilar artery perfusion on the basis of hemodynamically significant bilateral vertebral artery stenosis.  MRI shows 14 mm acute hemorrhage in the ventral pons without underlying mass or change from prior tracing.  Admission chemistries urine drug screen negative, troponin negative, MRSA PCR screening positive.  Echocardiogram with ejection fraction of 60 to 65% no wall motion abnormalities.  Placed on hypertonic saline 3%.  Eliquis has been discontinued due to ICH.  Cleviprex for blood pressure control.  Dysphagia #2 honey thick liquids.  .   Complete NIHSS TOTAL: 14 Glasgow Coma Scale Score: 15   Past Medical History      Past Medical History:  Diagnosis Date  . Abdominal aortic aneurysm [facility])      status post endoluminal stent graft at [facility] in 2006  . Abdominal aortic aneurysm (HCC) 2006  . Arthritis    . Coronary artery disease    . GERD (gastroesophageal reflux disease)    . History of shingles      TIMES 2  . Hyperlipidemia    . Hypertension    . MRSA (methicillin resistant Staphylococcus aureus) infection    . Numbness      right leg from knee down worse when standing   . PONV (postoperative nausea and vomiting)    . Sleep apnea      pt scored 5 per Stop Bang tool at PAT visit 10/26/2015; results sent to [facility] NP  . Vertigo        Family History  family history includes  Alzheimer's disease in his sister; Cancer in his sister; Heart failure in his maternal grandfather, maternal grandmother, paternal grandfather, and paternal grandmother.   Prior Rehab/Hospitalizations:  Has the patient had prior rehab or hospitalizations prior to admission? yes   Has the patient had major surgery during 100 days prior to admission? No   Current Medications    Current Facility-Administered Medications:  .  0.9 %  sodium chloride infusion, , Intravenous, Continuous, Marvel Plan, MD, Last Rate: 50 mL/hr at 06/16/20 0035, New Bag at 06/16/20 0035 .  acetaminophen (TYLENOL) tablet 650 mg, 650 mg, Oral, Q4H PRN, 650 mg at 06/15/20 0557 **OR** acetaminophen (TYLENOL) 160 MG/5ML solution 650 mg, 650 mg, Per Tube, Q4H PRN, 650 mg at 06/12/20 0555 **OR** acetaminophen (TYLENOL) suppository 650 mg, 650 mg, Rectal, Q4H PRN, Caryl Pina, MD .  amLODipine (NORVASC) tablet 10 mg, 10 mg, Oral, Daily, Rica Mote, MD, 10 mg at 06/16/20 1001 .  Chlorhexidine Gluconate Cloth 2 % PADS 6 each, 6 each, Topical, Daily, Marvel Plan, MD, 6 each at 06/15/20 [phone] .  insulin aspart (novoLOG) injection 0-15 Units, 0-15 Units, Subcutaneous, Q4H, Collins, Loel Dubonnet, MD, 2 Units at 06/16/20 0502 .  labetalol (NORMODYNE) injection 20 mg, 20 mg, Intravenous, Q2H PRN, Micki Riley, MD, 20 mg at 06/16/20 2951 .  MEDLINE mouth rinse, 15 mL, Mouth Rinse, BID, Marvel Plan, MD, 15 mL at 06/16/20 1002 .  metoprolol tartrate (LOPRESSOR) tablet 50 mg, 50 mg, Oral, BID, Biby, [name], NP .  pantoprazole (PROTONIX) EC tablet 40 mg, 40 mg, Oral, QHS, Micki Riley, MD, 40 mg at 06/15/20 2113 .  pneumococcal 23 valent vaccine (PNEUMOVAX-23) injection 0.5 mL, 0.5 mL, Intramuscular, Prior to discharge, Marvel Plan, MD .  senna-docusate (Senokot-S) tablet 1 tablet,  1 tablet, Oral, BID, Micki Riley, MD, 1 tablet at 06/16/20 1001   Patients Current Diet:     Diet Order                      DIET DYS 2 Room  service appropriate? Yes; Fluid consistency: Honey Thick  Diet effective now                      Precautions / Restrictions Precautions Precautions: Fall Precaution Comments: R hemiplegia Restrictions Weight Bearing Restrictions: No    Has the patient had 2 or more falls or a fall with injury in the past year?Yes   Prior Activity Level Community (5-7x/wk): driving, working   Prior Functional Level Prior Function Level of Independence: Independent Comments: pt driving, working as a Retail buyer, independent with intermittent use of SPC   Self Care: Did the patient need help bathing, dressing, using the toilet or eating?  Independent   Indoor Mobility: Did the patient need assistance with walking from room to room (with or without device)? Independent   Stairs: Did the patient need assistance with internal or external stairs (with or without device)? Independent   Functional Cognition: Did the patient need help planning regular tasks such as shopping or remembering to take medications? Independent   Home Assistive Devices / Equipment Home Assistive Devices/Equipment: Eyeglasses Home Equipment: Walker - 4 wheels,Cane - single point,Shower seat   Prior Device Use: Indicate devices/aids used by the patient prior to current illness, exacerbation or injury? cane   Current Functional Level Cognition   Arousal/Alertness: Awake/alert Overall Cognitive Status: Impaired/Different from baseline Difficult to assess due to: Impaired communication Current Attention Level: Sustained Orientation Level: Oriented to person,Oriented to place,Oriented to situation Following Commands: Follows one step commands consistently Safety/Judgement: Decreased awareness of safety,Decreased awareness of deficits General Comments: A&Ox4, tearful when discussing careeer, [name], and family due to new deficits. Follows commands with increased time and to the best of his abilities. Pt communication  limited by dysarthric speech Attention: Sustained Sustained Attention: Appears intact Memory: Appears intact Awareness: Appears intact Behaviors: Lability    Extremity Assessment (includes Sensation/Coordination)   Upper Extremity Assessment: Defer to OT evaluation RUE Deficits / Details: flaccid RUE RUE Coordination: decreased fine motor,decreased gross motor  Lower Extremity Assessment: RLE deficits/detail RLE Deficits / Details: no active ROM or trace activation in RLE at this time RLE Sensation: decreased light touch RLE Coordination: decreased fine motor     ADLs   Overall ADL's : Needs assistance/impaired Eating/Feeding: NPO Grooming: Set up,Bed level,Wash/dry face,Supervision/safety Upper Body Bathing: Maximal assistance,Sitting Lower Body Bathing: Total assistance,+2 for physical assistance,+2 for safety/equipment,Sit to/from stand,Bed level Upper Body Dressing : Maximal assistance,Sitting Upper Body Dressing Details (indicate cue type and reason): donning new gown Lower Body Dressing: Total assistance,+2 for safety/equipment,+2 for physical assistance,Sit to/from stand,Bed level Lower Body Dressing Details (indicate cue type and reason): assist for socks; maxA+2 for sit<>stand Toileting- Clothing Manipulation and Hygiene: Total assistance,+2 for physical assistance,+2 for safety/equipment,Sit to/from stand Toileting - Clothing Manipulation Details (indicate cue type and reason): assist to stand with PT/OT while RN assisting with pericare Functional mobility during ADLs: Maximal assistance,+2 for physical assistance,+2 for safety/equipment (sit<>stand at [facility])     Mobility   Overal bed mobility: Needs Assistance Bed Mobility: Supine to Sit,Sit to Supine,Rolling Rolling: Max assist Supine to sit: Max assist,HOB elevated Sit to supine: Max assist General bed mobility comments: max +2 for trunk and LE  management, step-wise LE translation into and out of bed, boost up in bed  upon return to supine. Increased time and effort.     Transfers   Overall transfer level: Needs assistance Equipment used: 1 person hand held assist Transfer via Lift Equipment: Stedy Transfers: Sit to/from Stand Sit to Stand: Total assist,+2 physical assistance General transfer comment: PT provides R knee block and BUE support, pt only able to come to 50% erect stance with PT support, incomplete knee and hip extension     Ambulation / Gait / Stairs / Wheelchair Mobility   Ambulation/Gait General Gait Details: NT- not placed in chair due to pt transfer orders     Posture / Balance Dynamic Sitting Balance Sitting balance - Comments: minA with RUE support Balance Overall balance assessment: Needs assistance Sitting-balance support: Single extremity supported,Feet supported Sitting balance-Leahy Scale: Poor Sitting balance - Comments: minA with RUE support Postural control: Right lateral lean Standing balance support: Bilateral upper extremity supported Standing balance-Leahy Scale: Zero Standing balance comment: totalA to achieve 50% erect standing position     Special needs/care consideration Hgb A1c 6.1    Previous Home Environment  Living Arrangements: Werner/significant other,Children  Lives With: Werner,[name] Available Help at Discharge: Family Type of Home: House Home Layout: One level Home Access: Stairs to enter Entrance Stairs-Rails: None Secretary/administrator of Steps: 2 Bathroom Shower/Tub: Engineer, manufacturing systems: Handicapped height Bathroom Accessibility: Yes How Accessible: Accessible via walker Home Care Services: No ([name] receiving HH per pt report)   Discharge Living Setting Plans for Discharge Living Setting: Patient's home Type of Home at Discharge: House Discharge Home Layout: One level Discharge Home Access: Stairs to enter Entrance Stairs-Rails: None Entrance Stairs-Number of Steps: 2 Discharge Bathroom Shower/Tub: Tub/shower  unit Discharge Bathroom Toilet: Handicapped height Discharge Bathroom Accessibility: Yes How Accessible: Accessible via walker Does the patient have any problems obtaining your medications?: Yes (Describe) (pt reported he sometimes has trouble)   Social/Family/Support Systems Anticipated Caregiver: [name], [name]; [name], Werner; [name], [name] Anticipated Caregiver's Contact Information: Liborio Nixon: [phone] Ability/Limitations of Caregiver: Liborio Nixon can't physically help but children can physically help Caregiver Availability: 24/7 Discharge Plan Discussed with Primary Caregiver: Yes Is Caregiver In Agreement with Plan?: Yes Does Caregiver/Family have Issues with Lodging/Transportation while Pt is in Rehab?: No   Goals Patient/Family Goal for Rehab: Min A PT/OT/ST Expected length of stay: 14-21 days Pt/Family Agrees to Admission and willing to participate: Yes Program Orientation Provided & Reviewed with Pt/Caregiver Including Roles  & Responsibilities: Yes   Decrease burden of Care through IP rehab admission: n/a   Possible need for SNF placement upon discharge:if patient does not reach 1 caregiver support level   Patient Condition: This patient's medical and functional status has changed since the consult dated: 06/13/2020 in which the Rehabilitation Physician determined and documented that the patient's condition is appropriate for intensive rehabilitative care in an inpatient rehabilitation facility. See "History of Present Illness" (above) for medical update. Functional changes are: overall max assist. Patient's medical and functional status update has been discussed with the Rehabilitation physician and patient remains appropriate for inpatient rehabilitation. Will admit to inpatient rehab today.   Preadmission Screen Completed By:  Clois Dupes, RN, 06/16/2020 12:01 PM ______________________________________________________________________   Discussed status with Dr.  Carlis Abbott on 06/16/2020 at  1200 and received approval for admission today.   Admission Coordinator:  Clois Dupes, time 1200 Date 06/16/2020             Cosigned by: Carlis Abbott,  Drema Pry, MD at 06/16/2020 12:06 PM    Revision History                                  Note Details  Author Gavin Brooking, RN File Time 06/16/2020 12:01 PM  Author Type Rehab Admission Coordinator Status Signed  Last Editor Gavin Brooking, RN Service Physical Medicine and Rehabilitation

## 2020-06-16 NOTE — TOC Transition Note (Signed)
Transition of Care [facility]) - CM/SW Discharge Note   Patient Details  Name: [name] MRN: [id number] Date of Birth: November 30, 1940  Transition of Care [facility] CM/SW Contact:  Kermit Balo, RN Phone Number: 06/16/2020, 1:03 PM   Clinical Narrative:    Pt discharging to CIR today. CM signing off.    Final next level of care: IP Rehab Facility Barriers to Discharge: No Barriers Identified   Patient Goals and CMS Choice        Discharge Placement                       Discharge Plan and Services                                     Social Determinants of Health (SDOH) Interventions     Readmission Risk Interventions No flowsheet data found.

## 2020-06-16 NOTE — Progress Notes (Signed)
STROKE TEAM PROGRESS NOTE   INTERVAL HISTORY Patient is sitting up in bed. His grandson is at the bedside. He remains his severe dysarthria and hemiplegia which is unchanged. Vital signs are stable. He has approval for inpatient rehab bed today and will be transferred.  OBJECTIVE Vitals:   06/16/20 0838 06/16/20 0952 06/16/20 1001 06/16/20 1108  BP: (!) 161/100 (!) 157/96  (!) 156/91  Pulse: 97  85 77  Resp: 20   20  Temp: 98.4 F (36.9 C)   98.9 F (37.2 C)  TempSrc: Oral   Axillary  SpO2: 93%   94%  Weight:      Height:       CBC:  Recent Labs  Lab 06/10/20 0606 06/10/20 0647 06/14/20 0247 06/15/20 0232  WBC 6.0   < > 7.3 8.2  NEUTROABS 4.0  --   --   --   HGB 15.9   < > 15.1 14.9  HCT 47.0   < > 45.8 42.9  MCV 89.9   < > 92.3 88.8  PLT 120*   < > 140* 149*   < > = values in this interval not displayed.   Basic Metabolic Panel:  Recent Labs  Lab 06/14/20 0247 06/15/20 0232  NA 141 138  K 4.1 3.8  CL 106 107  CO2 26 22  GLUCOSE 118* 111*  BUN 16 15  CREATININE 1.02 1.01  CALCIUM 8.8* 8.7*   Lipid Panel:     Component Value Date/Time   CHOL 129 05/27/2020 0943   TRIG 99 06/13/2020 0603   HDL 32 (L) 05/27/2020 0943   CHOLHDL 4.0 05/27/2020 0943   CHOLHDL 5.8 11/20/2007 0425   VLDL 10 11/20/2007 0425   LDLCALC 84 05/27/2020 0943   HgbA1c:  Lab Results  Component Value Date   HGBA1C 6.1 (H) 06/11/2020   Urine Drug Screen:     Component Value Date/Time   LABOPIA NONE DETECTED 06/10/2020 1328   COCAINSCRNUR NONE DETECTED 06/10/2020 1328   LABBENZ NONE DETECTED 06/10/2020 1328   AMPHETMU NONE DETECTED 06/10/2020 1328   THCU NONE DETECTED 06/10/2020 1328   LABBARB NONE DETECTED 06/10/2020 1328    Alcohol Level     Component Value Date/Time   ETH <10 06/10/2020 0606    IMAGING  CT Code Stroke CTA Head W/WO contrast CT Code Stroke CTA Neck W/WO contrast CT Code Stroke Cerebral Perfusion with contrast 06/10/2020 IMPRESSION:  1. Poorly  enhancing dolichoectatic Basilar Artery, although the vessel does appear to remain patent. 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   2. Subsequently, there is confluent abnormal posterior circulation T-max >6s on CTP, with sparing the Left PCA territory on the basis of a fetal PCA origin on that side. No infarct core detected on CTP.  3. No CTA spot sign detected in the acute ventral pontine hemorrhage.  4. Bilateral calcified carotid artery atherosclerosis, 65-70% stenosis at the Left ICA bulb. No hemodynamically significant stenosis on the right. Dolichoectatic ICA siphons.  5. Aortic Atherosclerosis (ICD10-I70.0).   CT HEAD CODE STROKE WO CONTRAST 06/10/2020 IMPRESSION:  1. 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 No mass effect, extra-axial- or intraventricular extension of blood at this time.  2. 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   CT head WO Contrast  06/11/2020 IMPRESSION: Unchanged small focus of  hemorrhage in the brainstem.  MRI head WO Contrast 06/10/2020 IMPRESSION:  Negative for acute ischemic infarct  14 mm acute hemorrhage in the ventral pons without underlying mass or change from earlier today.  Numerous areas of chronic microhemorrhage in the brain bilaterally. Correlate with history of poorly controlled hypertension. If no such history is available, consider cerebral amyloid.  Chest Port 1 View 06/10/2020 IMPRESSION:  1. No acute cardiopulmonary abnormality.  2. Chronic thoracic Aortic Endograft.   Transthoracic Echocardiogram  06/10/2020 IMPRESSIONS  1. Left ventricular ejection fraction, by estimation, is 60 to 65%. The  left ventricle has normal function. The left ventricle has no regional   wall motion abnormalities. There is moderate left ventricular hypertrophy.  Left ventricular diastolic function  could not be evaluated.  2. Right ventricular systolic function is normal. The right ventricular  size is normal. Tricuspid regurgitation signal is inadequate for assessing  PA pressure.  3. Left atrial size was mild to moderately dilated.  4. The mitral valve is normal in structure. Trivial mitral valve  regurgitation.  5. The aortic valve is tricuspid. There is mild calcification of the  aortic valve. There is mild thickening of the aortic valve. Aortic valve  regurgitation is mild. Mild aortic valve stenosis.  6. Aortic dilatation noted. There is mild dilatation of the ascending  aorta, measuring 42 mm.   ECG - atrial fibrillation - ventricular response 91 BPM (See cardiology reading for complete details)   PHYSICAL EXAM     Temp:  [98.1 F (36.7 C)-98.9 F (37.2 C)] 98.9 F (37.2 C) (12/23 1108) Pulse Rate:  [77-97] 77 (12/23 1108) Resp:  [18-20] 20 (12/23 1108) BP: (135-161)/(91-100) 156/91 (12/23 1108) SpO2:  [90 %-94 %] 94 % (12/23 1108) Pleasant elderly Caucasian male not in distress..  . Afebrile. Head is nontraumatic. Neck is supple without bruit.    Cardiac exam no murmur or gallop. Lungs are clear to auscultation. Distal pulses are well felt. Neurological Exam : Awake alert oriented to time place and person.  Severe dysarthria and can be understood with some difficulty.  Able to name repeat well.  Extraocular movements are full range without nystagmus.  Blinks to threat bilaterally.  Tongue midline.  Motor system exam shows dense right hemiplegia with 0/5 right upper and 2/5 right lower extremity strength.  Normal antigravity movements on the left side.  Sensation appears preserved bilaterally.  Gait not tested.   ASSESSMENT/PLAN [name] is a 79 y.o. male with history of  atrial fibrillation (on Eliquis - but missed last evening dose), AAA s/p  stent graft in 2006, CAD, shingles x 2, HLD, HTN, sleep apnea and vertigo presenting with severe Rt sided weakness, facial droop and severe dysarthria. He did not receive IV t-PA due to ICH.  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 No mass effect, extra-axial- or intraventricular extension of blood at this time. 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 Hemodynamically significant bilateral vertebral artery stenoses (bilateral distal V4 and also dominant Left vertebral origin). Bilateral calcified carotid artery atherosclerosis, 65-70% stenosis at the Left ICA bulb.  MRI head ventral pontine acute hemorrhage 14 mm.  Numerous chronic microhemorrhage bilaterally likely hypertensive    CT head repeat - Unchanged  small focus of hemorrhage in the brainstem.  2D Echo - EF 60 - 65%. No cardiac source of emboli identified.   Sars Corona Virus 2 - negative  LDL - 84  HgbA1c - 6.1  UDS - neg  VTE prophylaxis - SCDs  Eliquis (apixaban) daily prior to admission, now on No antithrombotic  Ongoing aggressive stroke risk factor management. Given Amyloid and ICH, anticoagulation not recommended  Therapy recommendations: CIR-await bed, medically ready once bed available  Disposition:  Pending  Chronic afib  On eliquis PTA  Received Kcentra reversal   Now on no antithrombotics  Given MRI showing numerous chronic microhemorrhages bilaterally, concerning for hypertensive versus cerebral amyloid, likely not to be future anticoagulation candidate  Hypertension  Home BP meds: metoprolol ; amlodipine  Treated w/ Cleviprex, now off  BP slightly elevated  Current BP meds:   Metoprolol 25 bid->50 bid and amlodipine 10  . SBP goal < 160 mm Hg . Long-term BP goal normotensive  Hyperlipidemia  Home Lipid lowering medication: none   LDL 84, goal < 70  Hold off statin given current ICH  Consider statin at discharge  Dysphagia  Did not pass swallow  NPO -> 12/18 - SLP Diet Recommendations: Dysphagia 2 (Fine chop) solids;Honey thick liquids   IVF @ 50  Speech to follow  Other Stroke Risk Factors  Advanced age  Coronary artery disease  Obstructive sleep apnea  AAA stat post stenting in 2006  Other Active Problems, Findings and Recommendations  Code status - Full Code  Aortic Atherosclerosis (ICD10-I70.0)   Hypokalemia - resolved  Mild thrombocytopenia - platelets - 149  Hospital day # 6 May consider possible participation in ASPIRE stroke study for patients with atrial fibrillation and intracerebral hemorrhage on anticoagulation. Will discuss with family to see if they're interested Delia Heady, MD To contact Stroke Continuity provider, please refer to [email or web address]. After hours, contact General Neurology

## 2020-06-16 NOTE — Progress Notes (Signed)
[name], MD  Physician  Physical Medicine and Rehabilitation  Consult Note      Signed  Date of Service:  06/13/2020  5:52 AM      Related encounter: ED to Hosp-Admission (Current) from 06/10/2020 in [facility]       Signed      Expand All Collapse All     Show:Clear all [x] Manual[x] Template[] Copied  Added by: [x] Angiulli, , PA-C[x] Raulkar, , MD   [] Hover for details           Physical Medicine and Rehabilitation Consult Reason for Consult: Acute onset right side weakness and dysarthria Referring Physician: Dr. Mcarthur Rossetti     HPI: [name] is a 79 y.o. right-handed male with history of atrial fibrillation maintained on Eliquis, AAA status post stent graft 2006, CAD status post stenting, hypertension, hyperlipidemia, OSA.  Per chart review patient lives with spouse.  1 level home 2 steps to entry.  Independent prior to admission with intermittent use of straight point cane working as a [employer].  Presented 06/10/2020 with acute onset of right side weakness with dysarthria.  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  No mass-effect.  CT angiogram of head and neck bilateral calcified carotid artery atherosclerosis 65 to 70% stenosis of the left ICA bulb.  No hemodynamically significant stenosis on the right.  Suspect severely delayed basilar artery perfusion on the basis of hemodynamically significant bilateral vertebral artery stenosis.  MRI shows 14 mm acute hemorrhage in the ventral pons without underlying mass or change from prior tracing.  Admission chemistries urine drug screen negative, troponin negative, MRSA PCR screening positive.  Echocardiogram with ejection fraction of 60 to 65% no wall motion abnormalities.  Placed on hypertonic saline 3%.  Eliquis has been discontinued due to ICH.  Cleviprex for blood pressure control.  Dysphagia #2 honey thick liquids.  Therapy evaluations completed with  recommendations of physical medicine rehab consult.     Review of Systems  Constitutional: Negative for chills and fever.  HENT: Negative for hearing loss.   Eyes: Negative for blurred vision and double vision.  Respiratory: Negative for cough and shortness of breath.   Cardiovascular: Positive for palpitations and leg swelling. Negative for chest pain.  Gastrointestinal: Positive for constipation. Negative for heartburn, nausea and vomiting.       GERD  Genitourinary: Positive for urgency. Negative for dysuria, flank pain and hematuria.  Musculoskeletal: Positive for joint pain and myalgias.  Skin: Negative for rash.  Neurological: Positive for weakness.       Vertigo  All other systems reviewed and are negative.   Past Medical History:  Diagnosis Date  . Abdominal aortic aneurysm [facility])      status post endoluminal stent graft at [facility] in 2006  . Abdominal aortic aneurysm (HCC) 2006  . Arthritis    . Coronary artery disease    . GERD (gastroesophageal reflux disease)    . History of shingles      TIMES 2  . Hyperlipidemia    . Hypertension    . MRSA (methicillin resistant Staphylococcus aureus) infection    . Numbness      right leg from knee down worse when standing   . PONV (postoperative nausea and vomiting)    . Sleep apnea      pt scored 5 per Stop Bang tool at PAT visit 10/26/2015; results sent to [facility] NP  . Vertigo  Past Surgical History:  Procedure Laterality Date  . ABDOMINAL AORTIC ANEURYSM REPAIR W/ ENDOLUMINAL GRAFT        13 years ago   . ANGIOPLASTY      . branch stenting   06/08/2004    Dr. Lavonne Chick  . CARDIAC CATHETERIZATION   06/08/2004  . CARDIOVASCULAR STRESS TEST   02/14/2009  . I&D of left knee       . LUMBAR LAMINECTOMY/DECOMPRESSION MICRODISCECTOMY N/A 11/02/2015    Procedure: MICROLUMBAR DECOMPRESSION L3-L4, L4-L5, AND L5-S1;  Surgeon: Jene Every, MD;  Location: [facility];  Service: Orthopedics;  Laterality: N/A;  .  PICC LINE PLACE PERIPHERAL ([facility] HX)      . TRANSTHORACIC ECHOCARDIOGRAM   06/07/2004         Family History  Problem Relation Age of Onset  . Alzheimer's disease Sister    . Heart failure Maternal Grandmother    . Heart failure Maternal Grandfather    . Heart failure Paternal Grandmother    . Heart failure Paternal Grandfather    . Cancer Sister      Social History:  reports that he has never smoked. He has never used smokeless tobacco. He reports that he does not drink alcohol and does not use drugs. Allergies:       Allergies  Allergen Reactions  . Crestor [Rosuvastatin] Other (See Comments)      Severe pain  . Lipitor [Atorvastatin] Other (See Comments)      Severe pain          Medications Prior to Admission  Medication Sig Dispense Refill  . amLODipine (NORVASC) 10 MG tablet TAKE 1 TABLET(10 MG) BY MOUTH DAILY (Patient taking differently: Take 10 mg by mouth daily.) 90 tablet 3  . metoprolol tartrate (LOPRESSOR) 25 MG tablet TAKE 1 TABLET BY MOUTH DAILY (Patient taking differently: Take 25 mg by mouth daily.) 30 tablet 1  . pantoprazole (PROTONIX) 40 MG tablet TAKE 1 TABLET(40 MG) BY MOUTH DAILY (Patient taking differently: Take 40 mg by mouth daily. TAKE 1 TABLET(40 MG) BY MOUTH DAILY) 90 tablet 3      Home: Home Living Family/patient expects to be discharged to:: Private residence Living Arrangements: Spouse/significant other,Children Available Help at Discharge: Family Type of Home: House Home Access: Stairs to enter Secretary/administrator of Steps: 2 Entrance Stairs-Rails: None Home Layout: One level Bathroom Shower/Tub: Engineer, manufacturing systems: Handicapped height Bathroom Accessibility: Yes Home Equipment: Environmental consultant - 4 wheels,Cane - single point,Shower seat  Lives With: Spouse,Son  Functional History: Prior Function Level of Independence: Independent Comments: pt driving, working as a Retail buyer, independent with intermittent use of  SPC Functional Status:  Mobility: Bed Mobility Overal bed mobility: Needs Assistance Bed Mobility: Supine to Sit,Sit to Supine Supine to sit: Max assist,+2 for physical assistance,+2 for safety/equipment Sit to supine: Max assist,+2 for physical assistance,+2 for safety/equipment General bed mobility comments: pt able to assist with moving LLE and reaching with LUE, requires assist for RLE managment, trunk and to significant assist to scoot hips towards EOB Transfers Overall transfer level: Needs assistance Equipment used: 2 person hand held assist Transfers: Sit to/from Stand Sit to Stand: Max assist,+2 physical assistance,+2 safety/equipment General transfer comment: via face to face and with R knee block; boosting/steadying assist throughout with cues/assist to faciliate trunk upright and hip extension; sit<>stand x2 from EOB while RN changing linens and assisting with pericare Ambulation/Gait General Gait Details: pt unable to take steps at this time   ADL: ADL Overall  ADL's : Needs assistance/impaired Eating/Feeding: Sitting,Moderate assistance Grooming: Moderate assistance,Sitting Upper Body Bathing: Maximal assistance,Sitting Lower Body Bathing: Total assistance,+2 for physical assistance,+2 for safety/equipment,Sit to/from stand,Bed level Upper Body Dressing : Maximal assistance,Sitting Upper Body Dressing Details (indicate cue type and reason): donning new gown Lower Body Dressing: Total assistance,+2 for safety/equipment,+2 for physical assistance,Sit to/from stand,Bed level Lower Body Dressing Details (indicate cue type and reason): assist for socks; maxA+2 for sit<>stand Toileting- Clothing Manipulation and Hygiene: Total assistance,+2 for physical assistance,+2 for safety/equipment,Sit to/from stand Toileting - Clothing Manipulation Details (indicate cue type and reason): assist to stand with PT/OT while RN assisting with pericare Functional mobility during ADLs: Maximal  assistance,+2 for physical assistance,+2 for safety/equipment (sit<>stand)   Cognition: Cognition Overall Cognitive Status: Impaired/Different from baseline Arousal/Alertness: Awake/alert Orientation Level: Oriented X4 Attention: Sustained Sustained Attention: Appears intact Memory: Appears intact Awareness: Appears intact Behaviors: Lability Cognition Arousal/Alertness: Awake/alert,Lethargic Behavior During Therapy: Flat affect Overall Cognitive Status: Impaired/Different from baseline Area of Impairment: Attention,Memory,Following commands,Awareness,Problem solving Current Attention Level: Sustained Memory: Decreased short-term memory Following Commands: Follows one step commands with increased time Awareness: Emergent Problem Solving: Slow processing,Decreased initiation,Requires verbal cues,Requires tactile cues General Comments: pt with dysarthric speech, aware of some of his deficits including R side weakness Difficult to assess due to: Impaired communication   Blood pressure (!) 159/88, pulse 78, temperature 98.3 F (36.8 C), temperature source Axillary, resp. rate (!) 25, height  (1.88 m), weight 96.2 kg, SpO2 97 %. Physical Exam General: Alert and oriented x 2 (not date), No apparent distress HEENT: Head is normocephalic, atraumatic, PERRLA, EOMI, sclera anicteric, oral mucosa pink and moist, dentition intact, ext ear canals clear,  Neck: Supple without JVD or lymphadenopathy Heart: Reg rate and rhythm. No murmurs rubs or gallops Chest: Tachypneic Abdomen: Soft, non-tender, non-distended, bowel sounds positive. Extremities: No clubbing, cyanosis, or edema. Pulses are 2+ Skin: Clean and intact without signs of breakdown Neuro: Patient is alert in no acute distress.  Severely dysarthric but intelligible.  Follows simple commands. R facial droop. RUE 0/5 strength, RLE 2/5 proximally and 0/5 distally. LLE strength intact.  Psych: Pt's affect is appropriate. Pt is  cooperative   Lab Results Last 24 Hours       Results for orders placed or performed during the hospital encounter of 06/10/20 (from the past 24 hour(s))  Glucose, capillary     Status: Abnormal    Collection Time: 06/12/20  7:23 AM  Result Value Ref Range    Glucose-Capillary 135 (H) 70 - 99 mg/dL  Glucose, capillary     Status: Abnormal    Collection Time: 06/12/20 11:16 AM  Result Value Ref Range    Glucose-Capillary 100 (H) 70 - 99 mg/dL  Glucose, capillary     Status: Abnormal    Collection Time: 06/12/20  3:12 PM  Result Value Ref Range    Glucose-Capillary 110 (H) 70 - 99 mg/dL  Glucose, capillary     Status: Abnormal    Collection Time: 06/12/20  7:48 PM  Result Value Ref Range    Glucose-Capillary 104 (H) 70 - 99 mg/dL  Glucose, capillary     Status: Abnormal    Collection Time: 06/12/20 11:38 PM  Result Value Ref Range    Glucose-Capillary 129 (H) 70 - 99 mg/dL  Glucose, capillary     Status: None    Collection Time: 06/13/20  3:52 AM  Result Value Ref Range    Glucose-Capillary 92 70 - 99 mg/dL  Imaging Results (Last 48 hours)  DG Swallowing Func-Speech Pathology   Result Date: 06/11/2020 Objective Swallowing Evaluation: Type of Study: Bedside Swallow Evaluation  Patient Details Name: Andren Bethea MRN: [id number] Date of Birth: [birth year] Today's Date: 06/11/2020 Time: SLP Start Time (ACUTE ONLY): 1137 -SLP Stop Time (ACUTE ONLY): 1155 SLP Time Calculation (min) (ACUTE ONLY): 18 min Past Medical History: Past Medical History: Diagnosis Date . Abdominal aortic aneurysm [facility])   status post endoluminal stent graft at [facility] in 2006 . Abdominal aortic aneurysm (HCC) 2006 . Arthritis  . Coronary artery disease  . GERD (gastroesophageal reflux disease)  . History of shingles   TIMES 2 . Hyperlipidemia  . Hypertension  . MRSA (methicillin resistant Staphylococcus aureus) infection  . Numbness   right leg from knee down worse when standing  . PONV (postoperative  nausea and vomiting)  . Sleep apnea   pt scored 5 per Stop Bang tool at PAT visit 10/26/2015; results sent to [facility] NP . Vertigo  Past Surgical History: Past Surgical History: Procedure Laterality Date . ABDOMINAL AORTIC ANEURYSM REPAIR W/ ENDOLUMINAL GRAFT    13 years ago  . ANGIOPLASTY   . branch stenting  06/08/2004  Dr. Lavonne Chick . CARDIAC CATHETERIZATION  06/08/2004 . CARDIOVASCULAR STRESS TEST  02/14/2009 . I&D of left knee    . LUMBAR LAMINECTOMY/DECOMPRESSION MICRODISCECTOMY N/A 11/02/2015  Procedure: MICROLUMBAR DECOMPRESSION L3-L4, L4-L5, AND L5-S1;  Surgeon: Jene Every, MD;  Location: [facility];  Service: Orthopedics;  Laterality: N/A; . PICC LINE PLACE PERIPHERAL ([facility] HX)   . TRANSTHORACIC ECHOCARDIOGRAM  06/07/2004 HPI: 79 y.o. male with atrial fibrillation on Eliquis, presenting with an acute anterior midline pontine hemorrhage  Neuro report includes  severe dysarthria, right facial droop and right hemiplegia.  Subjective: Pt awake, alert, pleasant, participative Assessment / Plan / Recommendation CHL IP CLINICAL IMPRESSIONS 06/11/2020 Clinical Impression Pt presents with moderate oropharyngeal dysphagia c/b decreased lingual strength and coordination, premature spillage, delayed swallo initiation, incomplete laryngeal closure and diminished sensation.  These deficits led to aspiration of thin and nectar thick liquid before and during the swallow with variable cough response.  Aspiration of thin liquid occurred at posterior trachea prior to the swallow 2/2 penetration of bolus through interarytenoid space which traveled on the vocal folds to the anterior portion of the trachea.  With nectar thick liquid by cup, initially only transient penetration was observed but as study progressed, pt aspirated a significant amount of bolus prior to the swallow.  By straw, there was penetration of nectar thick liquid to the level of the vocal folds with aspiration during the swallow.  Cough response was  inconsistent and significantly delayed.  Cough was ineffective to clear aspiration and penetration whether cued or reflexive.  There was no penetration or aspiration of honey thick liquid or solids.  There was prolonged oral phase with puree and solid textuers which increased with complexity of solid. With pill simulation there was prompt oral transit of tablet with honey thick liquid and no penetration or aspiraiton.  Esophageal transit of tablet appears to be [facility] as it could not be located on esphageal sweep.   Recommend chopped/ground diet with honey thick liquids. SLP Visit Diagnosis Dysphagia, oropharyngeal phase (R13.12) Attention and concentration deficit following -- Frontal lobe and executive function deficit following -- Impact on safety and function Moderate aspiration risk   CHL IP TREATMENT RECOMMENDATION 06/11/2020 Treatment Recommendations Therapy as outlined in treatment plan below   Prognosis 06/11/2020 Prognosis for Safe Diet  Advancement Good Barriers to Reach Goals -- Barriers/Prognosis Comment -- CHL IP DIET RECOMMENDATION 06/11/2020 SLP Diet Recommendations Dysphagia 2 (Fine chop) solids;Honey thick liquids Liquid Administration via Cup;No straw Medication Administration Whole meds with liquid Compensations Slow rate;Small sips/bites;Follow solids with liquid;Lingual sweep for clearance of pocketing Postural Changes Seated upright at 90 degrees   CHL IP OTHER RECOMMENDATIONS 06/11/2020 Recommended Consults -- Oral Care Recommendations Oral care BID Other Recommendations --   CHL IP FOLLOW UP RECOMMENDATIONS 06/11/2020 Follow up Recommendations Inpatient Rehab   CHL IP FREQUENCY AND DURATION 06/11/2020 Speech Therapy Frequency (ACUTE ONLY) min 2x/week Treatment Duration 2 weeks      CHL IP ORAL PHASE 06/11/2020 Oral Phase Impaired Oral - Pudding Teaspoon -- Oral - Pudding Cup -- Oral - Honey Teaspoon -- Oral - Honey Cup Premature spillage Oral - Nectar Teaspoon -- Oral - Nectar Cup Premature  spillage Oral - Nectar Straw Premature spillage Oral - Thin Teaspoon -- Oral - Thin Cup Premature spillage Oral - Thin Straw -- Oral - Puree Decreased bolus cohesion;Premature spillage Oral - Mech Soft Lingual/palatal residue;Lingual pumping Oral - Regular Decreased bolus cohesion;Premature spillage;Piecemeal swallowing;Lingual/palatal residue;Lingual pumping;Reduced posterior propulsion Oral - Multi-Consistency -- Oral - Pill WFL Oral Phase - Comment --  CHL IP PHARYNGEAL PHASE 06/11/2020 Pharyngeal Phase Impaired Pharyngeal- Pudding Teaspoon -- Pharyngeal -- Pharyngeal- Pudding Cup -- Pharyngeal -- Pharyngeal- Honey Teaspoon -- Pharyngeal -- Pharyngeal- Honey Cup WFL Pharyngeal -- Pharyngeal- Nectar Teaspoon -- Pharyngeal -- Pharyngeal- Nectar Cup Delayed swallow initiation-pyriform sinuses;Reduced airway/laryngeal closure;Penetration/Aspiration before swallow;Penetration/Aspiration during swallow;Moderate aspiration Pharyngeal Material enters airway, passes BELOW cords without attempt by patient to eject out (silent aspiration) Pharyngeal- Nectar Straw Delayed swallow initiation-pyriform sinuses;Reduced airway/laryngeal closure;Penetration/Aspiration before swallow;Penetration/Aspiration during swallow Pharyngeal Material enters airway, passes BELOW cords and not ejected out despite cough attempt by patient Pharyngeal- Thin Teaspoon -- Pharyngeal -- Pharyngeal- Thin Cup Delayed swallow initiation-pyriform sinuses;Penetration/Aspiration before swallow Pharyngeal Material enters airway, passes BELOW cords and not ejected out despite cough attempt by patient Pharyngeal- Thin Straw -- Pharyngeal -- Pharyngeal- Puree WFL Pharyngeal Material does not enter airway Pharyngeal- Mechanical Soft WFL Pharyngeal Material does not enter airway Pharyngeal- Regular WFL Pharyngeal Material does not enter airway Pharyngeal- Multi-consistency -- Pharyngeal -- Pharyngeal- Pill Delayed swallow initiation-vallecula Pharyngeal Material  does not enter airway Pharyngeal Comment --  CHL IP CERVICAL ESOPHAGEAL PHASE 06/11/2020 Cervical Esophageal Phase WFL Pudding Teaspoon -- Pudding Cup -- Honey Teaspoon -- Honey Cup -- Nectar Teaspoon -- Nectar Cup -- Nectar Straw -- Thin Teaspoon -- Thin Cup -- Thin Straw -- Puree -- Mechanical Soft -- Regular -- Multi-consistency -- Pill -- Cervical Esophageal Comment -- Kerrie PleasureLeigh E Borum, MA, CCC-SLP Acute Rehabilitation Services Office: [phone] 06/11/2020, 1:02 PM                    Assessment/Plan: Diagnosis: acute inferior pontine ICH 1. Does the need for close, 24 hr/day medical supervision in concert with the patient's rehab needs make it unreasonable for this patient to be served in a less intensive setting? Yes 2. Co-Morbidities requiring supervision/potential complications: chronic afib, HTN, HLD, dysphagia, CAD, OSA, AAA s/p stenting in 2006, overweight (BMI 27.23) 3. Due to bladder management, bowel management, safety, skin/wound care, disease management, medication administration, pain management and patient education, does the patient require 24 hr/day rehab nursing? Yes 4. Does the patient require coordinated care of a physician, rehab nurse, therapy disciplines of PT, OT, SLP to address physical and functional deficits in the context of the above  medical diagnosis(es)? Yes Addressing deficits in the following areas: balance, endurance, locomotion, strength, transferring, bowel/bladder control, bathing, dressing, feeding, grooming, toileting, cognition, speech, swallowing and psychosocial support 5. Can the patient actively participate in an intensive therapy program of at least 3 hrs of therapy per day at least 5 days per week? Yes 6. The potential for patient to make measurable gains while on inpatient rehab is excellent 7. Anticipated functional outcomes upon discharge from inpatient rehab are min assist  with PT, min assist with OT, min assist with SLP. 8. Estimated rehab length of  stay to reach the above functional goals is: 2-3 weeks 9. Anticipated discharge destination: Home 10. Overall Rehab/Functional Prognosis: excellent   RECOMMENDATIONS: This patient's condition is appropriate for continued rehabilitative care in the following setting: CIR Patient has agreed to participate in recommended program. Yes Note that insurance prior authorization may be required for reimbursement for recommended care.   Comment:  1) Acute inferior parietal ICH: continue neurology monitoring, plan for transfer to floor today. 2) Left wrist weakness: consider wrist splint. 3) Left dorsiflexion weakness: consider AFO 4) Impaired mobility and ADLs: admit to CIR once family support can be confirmed, patient is medically stable, and bed is available.    Thank you for this consult. Admission coordinator to follow.    I have personally performed a face to face diagnostic evaluation, including, but not limited to relevant history and physical exam findings, of this patient and developed relevant assessment and plan.  Additionally, I have reviewed and concur with the physician assistant's documentation above.   Sula Soda, MD   Charlton Amor, PA-C 06/13/2020          Revision History                        Routing History              Note Details  Author [name], MD File Time 06/13/2020 12:32 PM  Author Type Physician Status Signed  Last Editor [name], MD Service Physical Medicine and Rehabilitation

## 2020-06-16 NOTE — Progress Notes (Signed)
Inpatient Rehabilitation Medication Review by a Pharmacist  A complete drug regimen review was completed for this patient to identify any potential clinically significant medication issues.  Clinically significant medication issues were identified:  No  Check AMION for pharmacist assigned to patient if future medication questions/issues arise during this admission.  Time spent performing this drug regimen review (minutes):  15  Vicki Mallet, PharmD, BCPS, [facility] Clinical Pharmacist 06/16/2020 4:36 PM

## 2020-06-17 ENCOUNTER — Inpatient Hospital Stay (HOSPITAL_COMMUNITY): Payer: Medicare Other | Admitting: Speech Pathology

## 2020-06-17 ENCOUNTER — Inpatient Hospital Stay (HOSPITAL_COMMUNITY): Payer: Medicare Other | Admitting: Occupational Therapy

## 2020-06-17 ENCOUNTER — Inpatient Hospital Stay (HOSPITAL_COMMUNITY): Payer: Medicare Other

## 2020-06-17 ENCOUNTER — Inpatient Hospital Stay (HOSPITAL_COMMUNITY): Payer: Medicare Other | Admitting: Physical Therapy

## 2020-06-17 DIAGNOSIS — I613 Nontraumatic intracerebral hemorrhage in brain stem: Secondary | ICD-10-CM

## 2020-06-17 LAB — COMPREHENSIVE METABOLIC PANEL
ALT: 36 U/L (ref 0–44)
AST: 45 U/L — ABNORMAL HIGH (ref 15–41)
Albumin: 2.5 g/dL — ABNORMAL LOW (ref 3.5–5.0)
Alkaline Phosphatase: 71 U/L (ref 38–126)
Anion gap: 11 (ref 5–15)
BUN: 19 mg/dL (ref 8–23)
CO2: 22 mmol/L (ref 22–32)
Calcium: 8.8 mg/dL — ABNORMAL LOW (ref 8.9–10.3)
Chloride: 103 mmol/L (ref 98–111)
Creatinine, Ser: 1.05 mg/dL (ref 0.61–1.24)
GFR, Estimated: >60 mL/min (ref >60)
Glucose, Bld: 122 mg/dL — ABNORMAL HIGH (ref 70–99)
Potassium: 4.1 mmol/L (ref 3.5–5.1)
Sodium: 136 mmol/L (ref 135–145)
Total Bilirubin: 1.7 mg/dL — ABNORMAL HIGH (ref 0.3–1.2)
Total Protein: 6.5 g/dL (ref 6.5–8.1)

## 2020-06-17 LAB — CBC WITH DIFFERENTIAL/PLATELET
Abs Immature Granulocytes: 0.11 10*3/uL — ABNORMAL HIGH (ref 0.00–0.07)
Basophils Absolute: 0 10*3/uL (ref 0.0–0.1)
Basophils Relative: 0 %
Eosinophils Absolute: 0 10*3/uL (ref 0.0–0.5)
Eosinophils Relative: 0 %
HCT: 47.7 % (ref 39.0–52.0)
Hemoglobin: 16 g/dL (ref 13.0–17.0)
Immature Granulocytes: 1 %
Lymphocytes Relative: 7 %
Lymphs Abs: 0.8 10*3/uL (ref 0.7–4.0)
MCH: 30.2 pg (ref 26.0–34.0)
MCHC: 33.5 g/dL (ref 30.0–36.0)
MCV: 90 fL (ref 80.0–100.0)
Monocytes Absolute: 0.7 10*3/uL (ref 0.1–1.0)
Monocytes Relative: 6 %
Neutro Abs: 9.3 10*3/uL — ABNORMAL HIGH (ref 1.7–7.7)
Neutrophils Relative %: 86 %
Platelets: 169 10*3/uL (ref 150–400)
RBC: 5.3 MIL/uL (ref 4.22–5.81)
RDW: 13.4 % (ref 11.5–15.5)
WBC: 10.9 10*3/uL — ABNORMAL HIGH (ref 4.0–10.5)
nRBC: 0 % (ref 0.0–0.2)

## 2020-06-17 LAB — GLUCOSE, CAPILLARY
Glucose-Capillary: 111 mg/dL — ABNORMAL HIGH (ref 70–99)
Glucose-Capillary: 116 mg/dL — ABNORMAL HIGH (ref 70–99)
Glucose-Capillary: 117 mg/dL — ABNORMAL HIGH (ref 70–99)
Glucose-Capillary: 120 mg/dL — ABNORMAL HIGH (ref 70–99)
Glucose-Capillary: 144 mg/dL — ABNORMAL HIGH (ref 70–99)
Glucose-Capillary: 178 mg/dL — ABNORMAL HIGH (ref 70–99)

## 2020-06-17 LAB — MAGNESIUM
Magnesium: 1.6 mg/dL — ABNORMAL LOW (ref 1.7–2.4)
Magnesium: 1.6 mg/dL — ABNORMAL LOW (ref 1.7–2.4)

## 2020-06-17 LAB — PHOSPHORUS
Phosphorus: 3.1 mg/dL (ref 2.5–4.6)
Phosphorus: 2.9 mg/dL (ref 2.5–4.6)

## 2020-06-17 MED ORDER — OSMOLITE 1.5 CAL PO LIQD
1000.0000 mL | ORAL | Status: DC
  Administered 2020-06-17 – 2020-06-22 (×6): 1000 mL
  Filled 2020-06-17: qty 1000

## 2020-06-17 MED ORDER — PROSOURCE TF PO LIQD: 45.0000 mL | Freq: Two times a day (BID) | ORAL | Status: DC

## 2020-06-17 MED ORDER — VITAL HIGH PROTEIN PO LIQD
1000.0000 mL | ORAL | Status: DC
  Filled 2020-06-17: qty 1000

## 2020-06-17 MED ORDER — PROSOURCE TF PO LIQD
45.0000 mL | Freq: Three times a day (TID) | ORAL | Status: DC
  Administered 2020-06-17 – 2020-07-08 (×59): 45 mL
  Filled 2020-06-17 (×57): qty 45

## 2020-06-17 MED ORDER — FREE WATER
150.0000 mL | Status: DC
  Administered 2020-06-17 – 2020-07-07 (×111): 150 mL

## 2020-06-17 NOTE — Evaluation (Signed)
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 Abdominal aortic aneurysm [facility])    status post endoluminal stent graft at [facility] in 2006  . Abdominal aortic aneurysm ([place]) 2006  . Arthritis   . Coronary artery disease   . GERD (gastroesophageal reflux disease)   . History of shingles    TIMES 2  . Hyperlipidemia   . Hypertension   . MRSA (methicillin resistant Staphylococcus aureus) infection   . Numbness    right leg from knee down worse when standing   . PONV (postoperative nausea and vomiting)   . Sleep apnea    pt scored 5 per Stop Bang tool at PAT visit 10/26/2015; results sent to [facility] NP  . Vertigo    Past Surgical History:  Past Surgical History:  Procedure Laterality Date  . ABDOMINAL AORTIC ANEURYSM REPAIR W/ ENDOLUMINAL GRAFT     13 years ago   . ANGIOPLASTY    . branch stenting  06/08/2004   Dr. Janene Madeira  . CARDIAC CATHETERIZATION  06/08/2004  . CARDIOVASCULAR STRESS TEST  02/14/2009  . I&D of left knee     . LUMBAR LAMINECTOMY/DECOMPRESSION MICRODISCECTOMY N/A 11/02/2015   Procedure: MICROLUMBAR DECOMPRESSION L3-L4, L4-L5, AND L5-S1;  Surgeon: Susa Day, MD;  Location: [facility]S;  Service: Orthopedics;  Laterality: N/A;  . PICC LINE PLACE PERIPHERAL ([place] [facility])    . TRANSTHORACIC ECHOCARDIOGRAM  06/07/2004    Assessment & Plan Clinical Impression:  [name] is a 79 year old right-handed male with history of atrial fibrillation  maintained on Eliquis, AAA status post stent graft 2006, CAD status post stenting, hypertension hyperlipidemia and OSA.  Per chart review lives with spouse 1 level home 2 steps to entry.  Independent prior to admission with intermittent use of a straight point cane working as a Engineer, building services.  Presented 06/10/2020 with acute onset of right side weakness and dysarthria.  Admission chemistries unremarkable except potassium 3.4, glucose 128, hemoglobin A1c 6.1 troponin negative, alcohol/urine drug screen negative.  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  No mass-effect.  CT angiogram of head and neck bilateral calcified carotid artery atherosclerosis 65 to 70% stenosis of the left ICA bulb.  No hemodynamically significant stenosis on the right.  Suspect severely delayed basilar artery perfusion on the basis of hemodynamically significant bilateral vertebral artery stenosis.  MRI showed 14 mm acute hemorrhage in the left ventral pons without underlying mass or change from prior tracing.  Admission chemistries urine drug screen negative troponin negative MRSA PCR screening positive.  Echocardiogram with ejection fraction 60 to 65% no wall motion abnormalities.  Initially placed on hypertonic saline 3%.  Eliquis has been discontinued due to [place].  Cleviprex initiated for blood pressure control.  Dysphagia #2 honey thick liquids.  Due to patient's acute right side weakness dysarthria he was admitted for a comprehensive rehab program. Patient transferred to CIR on 06/16/2020 .   Patient currently requires total with mobility secondary to muscle weakness and muscle paralysis, decreased cardiorespiratoy endurance, abnormal tone  and unbalanced muscle activation, decreased visual perceptual skills and decreased sitting balance, decreased standing balance, decreased postural control, hemiplegia and decreased balance strategies.  Prior to hospitalization, patient was modified independent  with mobility  and lived with Spouse,Son in a House home.  Home access is 2Stairs to enter.  Patient will benefit from skilled PT intervention to maximize safe functional mobility, minimize fall risk and decrease caregiver burden for planned discharge home with 24 hour assist.  Anticipate patient will benefit from follow up [facility] at discharge.  PT - End of Session Activity Tolerance: Tolerates 30+ min activity with multiple rests Endurance Deficit: Yes Endurance Deficit Description: frequent rest breaks, fatigues quickly PT Assessment Rehab Potential (ACUTE/IP ONLY): Fair PT Barriers to Discharge: Decreased caregiver support;Home environment access/layout;Incontinence;Lack of/limited family support PT Patient demonstrates impairments in the following area(s): Balance;Edema;Endurance;Motor;Perception;Safety;Sensory PT Transfers Functional Problem(s): Bed Mobility;Bed to Chair;Car;Furniture;Floor PT Locomotion Functional Problem(s): Ambulation;Wheelchair Mobility;Stairs PT Plan PT Intensity: Minimum of 1-2 x/day ,45 to 90 minutes PT Frequency: 5 out of 7 days PT Duration Estimated Length of Stay: 28-30 days PT Treatment/Interventions: Ambulation/gait training;Balance/vestibular training;Cognitive remediation/compensation;Community reintegration;Discharge planning;Disease management/prevention;DME/adaptive equipment instruction;Functional electrical stimulation;Functional mobility training;Neuromuscular re-education;Pain management;Patient/family education;Psychosocial support;Splinting/orthotics;Stair training;Therapeutic Activities;Therapeutic Exercise;UE/LE Strength taining/ROM;UE/LE Coordination activities;Visual/perceptual remediation/compensation;Wheelchair propulsion/positioning PT Transfers Anticipated Outcome(s): min A PT Locomotion Anticipated Outcome(s): min A with LRAD PT Recommendation Follow Up Recommendations: Home health PT;24 hour supervision/assistance Patient destination: Home Equipment  Recommended: Wheelchair (measurements);Wheelchair cushion (measurements) Equipment Details: TBD pending progress   PT Evaluation Precautions/Restrictions Precautions Precautions: Fall Precaution Comments: R hemiplegia with subluxation Restrictions Weight Bearing Restrictions: No General PT Amount of Missed Time (min): 15 Minutes PT Missed Treatment Reason: Patient fatigue  Home Living/Prior Functioning Home Living Available Help at Discharge: Family Type of Home: House Home Access: Stairs to enter Technical brewer of Steps: 2 Entrance Stairs-Rails: None Home Layout: One level Additional Comments: per chart  Lives With: Spouse;Son Prior Function Level of Independence: Independent with gait;Independent with transfers;Requires assistive device for independence (occasional SPC use)  Able to Take Stairs?: Yes Driving: Yes Vocation: Full time employment Comments: pt driving, working as a Theatre stage manager, independent with intermittent use of [place] Vision/Perception  Vision - History Patient Visual Report: Diplopia;Blurring of vision Perception Perception: Impaired Inattention/Neglect: Impaired-to be further tested in functional context Praxis Praxis: Intact  Cognition Overall Cognitive Status: Difficult to assess Arousal/Alertness: Awake/alert Orientation Level: Oriented X4 Attention: Sustained Sustained Attention: Appears intact Memory: Appears intact Awareness: Appears intact Problem Solving: Appears intact Safety/Judgment: Appears intact Sensation Sensation Light Touch: Appears Intact Proprioception: Impaired by gross assessment Coordination Gross Motor Movements are Fluid and Coordinated: No Fine Motor Movements are Fluid and Coordinated: No Coordination and Movement Description: dense RUE hemiplegia - some movement in RLE Motor  Motor Motor: Hemiplegia;Abnormal tone Motor - Skilled Clinical Observations: very hypotonic   Trunk/Postural Assessment   Cervical Assessment Cervical Assessment: Exceptions to [facility] (limited ROM) Thoracic Assessment Thoracic Assessment: Exceptions to [facility] (rounded shoulders) Lumbar Assessment Lumbar Assessment: Exceptions to [facility] (posterior pelvic tilt) Postural Control Postural Control: Deficits on evaluation Trunk Control: impaired with lateral and posterior lean Righting Reactions: delayed/insufficient Protective Responses: delayed/insufficient Postural Limitations: impaired  Balance Balance Balance Assessed: Yes Static Sitting Balance Static Sitting - Balance Support: No upper extremity supported;Feet supported Static Sitting - Level of Assistance: 3: Mod assist Dynamic Sitting Balance Dynamic Sitting - Balance Support: No upper extremity supported;Feet supported;During functional activity Dynamic Sitting - Level of Assistance: 2: Max assist Extremity Assessment      RLE Assessment RLE Assessment:  Exceptions to [facility] General Strength Comments: 0/5 when tested but can move slightly in functional context LLE Assessment LLE Assessment: Within Functional Limits General Strength Comments: 4/5 grossly  Care Tool Care Tool Bed Mobility Roll left and right activity   Roll left and right assist level: Moderate Assistance - Patient 50 - 74%    Sit to lying activity   Sit to lying assist level: 2 Helpers    Lying to sitting edge of bed activity   Lying to sitting edge of bed assist level: 2 Helpers     Care Tool Transfers Sit to stand transfer   Sit to stand assist level: 2 Helpers    Chair/bed transfer Chair/bed transfer activity did not occur: Safety/medical concerns       Toilet transfer Toilet transfer activity did not occur: Safety/medical concerns      Scientist, product/process development transfer activity did not occur: Safety/medical concerns        Care Tool Locomotion Ambulation Ambulation activity did not occur: Safety/medical concerns        Walk 10 feet activity Walk 10 feet activity did not  occur: Safety/medical concerns       Walk 50 feet with 2 turns activity Walk 50 feet with 2 turns activity did not occur: Safety/medical concerns      Walk 150 feet activity Walk 150 feet activity did not occur: Safety/medical concerns      Walk 10 feet on uneven surfaces activity Walk 10 feet on uneven surfaces activity did not occur: Safety/medical concerns      Stairs Stair activity did not occur: Safety/medical concerns        Walk up/down 1 step activity Walk up/down 1 step or curb (drop down) activity did not occur: Safety/medical concerns     Walk up/down 4 steps activity did not occuR: Safety/medical concerns  Walk up/down 4 steps activity      Walk up/down 12 steps activity Walk up/down 12 steps activity did not occur: Safety/medical concerns      Pick up small objects from floor Pick up small object from the floor (from standing position) activity did not occur: Safety/medical concerns      Wheelchair Will patient use wheelchair at discharge?: Yes Type of Wheelchair:  (TBD) Wheelchair activity did not occur: Safety/medical concerns      Wheel 50 feet with 2 turns activity Wheelchair 50 feet with 2 turns activity did not occur: Safety/medical concerns    Wheel 150 feet activity Wheelchair 150 feet activity did not occur: Safety/medical concerns      Refer to Care Plan for Long Term Goals  SHORT TERM GOAL WEEK 1 PT Short Term Goal 1 (Week 1): Pt will maintain static sitting balance with min A x 5 min PT Short Term Goal 2 (Week 1): Pt will perform least restrictive transfer OOB with assist x 2 PT Short Term Goal 3 (Week 1): Pt will initiate w/c mobility PT Short Term Goal 4 (Week 1): Pt will tolerate sitting OOB x 1 hour  Recommendations for other services: None   Skilled Therapeutic Intervention Evaluation completed (see details above and below) with education on PT POC and goals and individual treatment initiated with focus on functional bed mobility and  transfer assessment as well as setting pt up with appropriate equipment to be used during rehab stay. Pt received seated in bed, agreeable to PT session. No complaints of pain. Set pt up with TIS chair with Roho cushion. Pt appears lethargic throughout session but reports  he does not feel fatigued. Use of RUE sling with mobility. Supine to sit with assist x 2 for LE management and trunk control. Pt initially with R lateral lean, unable to correct without mod to max A. Sit to stand with max to total A x 2 to stedy. Pt unable to maintain upright trunk in standing, requires mod to max A x 2 for upright posture. Sitting balance EOB for short durations with close CGA but then pt has lateral and/or posterior LOB requiring mod to max A to correct. With onset of fatigue pt exhibits loss of balance to the L laterally. Pt returned to supine with assist x 2 for LE management and trunk control. Attempted to engage patient in LE therex at bed level, pt falling asleep with eyes closed. Pt left seated in bed with needs in reach, bed alarm in place at end of session. Pt missed 15 min of scheduled therapy evaluation due to fatigue.  Mobility Bed Mobility Bed Mobility: Rolling Right;Rolling Left;Supine to Sit;Sit to Supine Rolling Right: Moderate Assistance - Patient 50-74% Rolling Left: Moderate Assistance - Patient 50-74% Supine to Sit: 2 Helpers Sit to Supine: 2 Helpers Transfers Transfers: Sit to Stand Sit to Stand: Dependent - mechanical lift Transfer via Lift Equipment: Runner, broadcasting/film/video Mobility: No   Discharge Criteria: Patient will be discharged from PT if patient refuses treatment 3 consecutive times without medical reason, if treatment goals not met, if there is a change in medical status, if patient makes no progress towards goals or if patient is discharged from hospital.  The above assessment, treatment plan, treatment alternatives and goals were discussed and mutually  agreed upon: by patient    Excell Seltzer, PT, DPT 06/17/2020, 5:12 PM

## 2020-06-17 NOTE — Procedures (Signed)
Cortrak  Person Inserting Tube:  Braheem Tomasik C, RD Tube Type:  Cortrak - 43 inches Tube Location:  Right nare Initial Placement:  Stomach Secured by: Bridle Technique Used to Measure Tube Placement:  Documented cm marking at nare/ corner of mouth Cortrak Secured At:  71 cm    Cortrak Tube Team Note:  Consult received to place a Cortrak feeding tube.   No x-ray is required. RN may begin using tube.    If the tube becomes dislodged please keep the tube and contact the Cortrak team at [email or web address] (password [email or web address]) for replacement.  If after hours and replacement cannot be delayed, place a NG tube and confirm placement with an abdominal x-ray.    Karmello Abercrombie P., RD, LDN, CNSC See AMiON for contact information    

## 2020-06-17 NOTE — Progress Notes (Signed)
Initial Nutrition Assessment  DOCUMENTATION CODES:   Not applicable  INTERVENTION:   -Initiate Osmolite 1.5 @ 20 ml/hr via cortrak tube and increase by 10 ml every 8 hours to goal rate of 60 ml/hr.   45 ml Prosource TF TID   150 ml free water flush every 4 hours  Tube feeding regimen provides 2280 kcal (100% of needs), 123 grams of protein, and 1097 ml of H2O. Total free water: 1997 ml daily  -Monitor Mg, K, and Phos daily and replete as needed due to high refeeding risk   NUTRITION DIAGNOSIS:   Inadequate oral intake related to inability to eat,dysphagia as evidenced by NPO status.  GOAL:   Patient will meet greater than or equal to 90% of their needs  MONITOR:   Diet advancement,Labs,Weight trends,TF tolerance,I & O's  REASON FOR ASSESSMENT:   Consult Enteral/tube feeding initiation and management  ASSESSMENT:   [name] is a 79 year old right-handed male with history of atrial fibrillation maintained on Eliquis, AAA status post stent graft 2006, CAD status post stenting, hypertension hyperlipidemia and OSA.  Pt admitted with right side weakness with dysphagia as well as dysarthria secondary to acute inferior pontine hemorrhage likely due to hypertension in the setting of Eliquis use as well significant bilateral vertebral artery stenosis.   Reviewed I/O's: 0 ml x 24 hours  Spoke with pt wife at bedside, who reports pt has had minimal intake over the past week secondary to dysphagia. 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 Per pt wife, "she always made sure that he ate when he was there". Pt wife endorses that pt has had decreased oral intake over the past several years and favorite foods include tomato sandwiches and sodas. He consumes 3 meals and a snack daily.   Pt wife endorses progressive weight loss over the past several years. She reports his UBW is around 226#. Per wt hx, pt has experienced a 9.8% wt loss over the past 6 months.    Cortrak being placed at time of visit (tip of tube in stomach). RD explained to wife how pt would receive nutrition at this time and rationale for NPO status. Discussed that pt could still receive TF support when advanced to diet and that tube would be removed when pt is able to demonstrate sufficient PO intake. Pt wife very appreciative of explanation and RD visit.   Labs reviewed: CBGS: 108-116.   NUTRITION - FOCUSED PHYSICAL EXAM:  Flowsheet Row Most Recent Value  Orbital Region No depletion  Upper Arm Region No depletion  Thoracic and Lumbar Region No depletion  Buccal Region No depletion  Temple Region Mild depletion  Clavicle Bone Region No depletion  Clavicle and Acromion Bone Region No depletion  Scapular Bone Region No depletion  Dorsal Hand Mild depletion  Patellar Region No depletion  Anterior Thigh Region No depletion  Posterior Calf Region No depletion  Edema (RD Assessment) None  Hair Reviewed  Eyes Reviewed  Mouth Reviewed  Skin Reviewed  Nails Reviewed       Diet Order:   Diet Order            Diet NPO time specified  Diet effective now                 EDUCATION NEEDS:   Education needs have been addressed  Skin:  Skin Assessment: Reviewed RN Assessment  Last BM:  06/17/20  Height:   Ht Readings from Last 1 Encounters:  06/16/20  6\' 2"  (1.88 m)    Weight:   Wt Readings from Last 1 Encounters:  06/16/20 93.8 kg    Ideal Body Weight:  86.4 kg  BMI:  Body mass index is 26.55 kg/m.  Estimated Nutritional Needs:   Kcal:  2150-2350  Protein:  115-130 grams  Fluid:  > 2 L    06-02-1990, RD, LDN, CDCES Registered Dietitian II Certified Diabetes Care and Education Specialist Please refer to [facility] for RD and/or RD on-call/weekend/after hours pager

## 2020-06-17 NOTE — Evaluation (Signed)
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 Abdominal aortic aneurysm [facility])    status post endoluminal stent graft at [facility] in 2006  . Abdominal aortic aneurysm ([place]) 2006  . Arthritis   . Coronary artery disease   . GERD (gastroesophageal reflux disease)   . History of shingles    TIMES 2  . Hyperlipidemia   . Hypertension   . MRSA (methicillin resistant Staphylococcus aureus) infection   . Numbness    right leg from knee down worse when standing   . PONV (postoperative nausea and vomiting)   . Sleep apnea    pt scored 5 per Stop Bang tool at PAT visit 10/26/2015; results sent to [facility] NP  . Vertigo    Past Surgical History:  Past Surgical History:  Procedure Laterality Date  . ABDOMINAL AORTIC ANEURYSM REPAIR W/ ENDOLUMINAL GRAFT     13 years ago   . ANGIOPLASTY    . branch stenting  06/08/2004   Dr. Janene Madeira  . CARDIAC CATHETERIZATION  06/08/2004  . CARDIOVASCULAR STRESS TEST  02/14/2009  . I&D of left knee     . LUMBAR LAMINECTOMY/DECOMPRESSION MICRODISCECTOMY N/A 11/02/2015   Procedure: MICROLUMBAR DECOMPRESSION L3-L4, L4-L5, AND L5-S1;  Surgeon: Susa Day, MD;  Location: [facility];  Service: Orthopedics;  Laterality: N/A;  . PICC LINE PLACE PERIPHERAL ([place] HX)    . TRANSTHORACIC ECHOCARDIOGRAM  06/07/2004    Assessment & Plan Clinical Impression: [name] is a 79 year old  right-handed male with history of atrial fibrillation maintained on Eliquis, AAA status post stent graft 2006, CAD status post stenting, hypertension hyperlipidemia and OSA.  Per chart review lives with spouse 1 level home 2 steps to entry.  Independent prior to admission with intermittent use of a straight point cane working as a Engineer, building services.  Presented 06/10/2020 with acute onset of right side weakness and dysarthria.  Admission chemistries unremarkable except potassium 3.4, glucose 128, hemoglobin A1c 6.1 troponin negative, alcohol/urine drug screen negative.  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  No mass-effect.  CT angiogram of head and neck bilateral calcified carotid artery atherosclerosis 65 to 70% stenosis of the left ICA bulb.  No hemodynamically significant stenosis on the right.  Suspect severely delayed basilar artery perfusion on the basis of hemodynamically significant bilateral vertebral artery stenosis.  MRI showed 14 mm acute hemorrhage in the left ventral pons without underlying mass or change from prior tracing.  Admission chemistries urine drug screen negative troponin negative MRSA PCR screening positive.  Echocardiogram with ejection fraction 60 to 65% no wall motion abnormalities.  Initially placed on hypertonic saline 3%.  Eliquis has been discontinued due to [place].  Cleviprex initiated for blood pressure control.  Dysphagia #2 honey thick liquids.  Due to patient's acute right side weakness dysarthria he was admitted for a comprehensive rehab program.  Patient transferred to CIR on 06/16/2020 .    Patient currently requires total with basic self-care skills  secondary to muscle weakness, decreased cardiorespiratoy endurance, abnormal tone, decreased visual acuity, decreased visual perceptual skills and decreased visual motor skills, decreased attention to right, delayed processing and decreased sitting balance, decreased standing balance, decreased postural  control and hemiplegia.  Prior to hospitalization, patient was fully independent.  Patient will benefit from skilled intervention to increase independence with basic self-care skills prior to discharge home with care partner.  Anticipate patient will require minimal physical assistance and follow up home health.  OT - End of Session Endurance Deficit: Yes OT Assessment Rehab Potential (ACUTE ONLY): Good OT Patient demonstrates impairments in the following area(s): Balance;Cognition;Edema;Endurance;Motor;Nutrition;Perception;Sensory;Skin Integrity;Vision OT Basic ADL's Functional Problem(s): Eating;Grooming;Bathing;Dressing;Toileting OT Transfers Functional Problem(s): Toilet;Tub/Shower OT Additional Impairment(s): Fuctional Use of Upper Extremity OT Plan OT Intensity: Minimum of 1-2 x/day, 45 to 90 minutes OT Frequency: 5 out of 7 days OT Duration/Estimated Length of Stay: 28-30 days OT Treatment/Interventions: Balance/vestibular training;Cognitive remediation/compensation;Discharge planning;Functional mobility training;DME/adaptive equipment instruction;Functional electrical stimulation;Neuromuscular re-education;Patient/family education;Psychosocial support;Therapeutic Activities;Splinting/orthotics;Skin care/wound managment;Self Care/advanced ADL retraining;Therapeutic Exercise;UE/LE Strength taining/ROM;UE/LE Coordination activities;Visual/perceptual remediation/compensation OT Self Feeding Anticipated Outcome(s): supervision OT Basic Self-Care Anticipated Outcome(s): MIn A OT Toileting Anticipated Outcome(s): Min A OT Bathroom Transfers Anticipated Outcome(s): Min A OT Recommendation Patient destination: Home Follow Up Recommendations: Home health OT Equipment Recommended: To be determined   OT Evaluation Precautions/Restrictions  Precautions Precaution Comments: R hemiplegia with subluxation Restrictions Weight Bearing Restrictions: No Pain Pain Assessment Pain Scale:  0-10 Pain Score: 0-No pain Home Living/Prior Functioning Home Living Family/patient expects to be discharged to:: Private residence Living Arrangements: Spouse/significant other,Children Available Help at Discharge: Family Type of Home: House Home Access: Stairs to enter Technical brewer of Steps: 2 Entrance Stairs-Rails: None Home Layout: One level Bathroom Shower/Tub: Chiropodist: Handicapped height  Lives With: Spouse,Son Prior Function Level of Independence: Independent with basic ADLs,Independent with homemaking with ambulation,Independent with gait,Independent with transfers,Requires assistive device for independence (used SPC)  Able to Take Stairs?: Yes Driving: Yes Vocation: Full time employment Comments: pt driving, working as a Theatre stage manager, independent with intermittent use of SPC Vision Baseline Vision/History: Wears glasses Wears Glasses: Reading only Patient Visual Report: Blurring of vision Vision Assessment?: Vision impaired- to be further tested in functional context Additional Comments: pt reports diplopia and blurry vision, will need continued assessment Perception  Perception: Impaired Inattention/Neglect: Impaired-to be further tested in functional context (appears to have decreased attention to R - difficult to fully assess this session) Praxis Praxis: Intact (to be assessed further, but appears to be intact) Cognition Overall Cognitive Status:  (severe dysarthria) Arousal/Alertness: Awake/alert Orientation Level: Person;Place;Situation Person: Oriented Place: Oriented Situation: Oriented Year: 2021 Month: December Day of Week: Correct Memory: Appears intact Immediate Memory Recall: Sock;Blue;Bed Memory Recall Sock: Without Cue Memory Recall Blue: Without Cue Memory Recall Bed: Not able to recall Attention: Sustained Sustained Attention: Appears intact Awareness: Appears intact Problem Solving: Appears  intact Behaviors: Lability Safety/Judgment: Appears intact Sensation Sensation Light Touch: Appears Intact Hot/Cold: Not tested Proprioception: Impaired by gross assessment Stereognosis: Impaired by gross assessment Coordination Gross Motor Movements are Fluid and Coordinated: No Fine Motor Movements are Fluid and Coordinated: No Coordination and Movement Description: dense RUE hemiplegia - some movement in RLE Motor  Motor Motor: Hemiplegia;Abnormal tone Motor - Skilled Clinical Observations: very hypotonic  Trunk/Postural Assessment  Cervical Assessment Cervical Assessment: Exceptions to [facility] (limited PROM) Postural Control Postural Control:  (unable to assess posture in sitting, as no +2 available this session)  Balance Balance Balance Assessed:  (unable to assess  in OT session as no +2 available) Extremity/Trunk Assessment RUE Assessment RUE Assessment: Exceptions to [facility] Passive Range of Motion (PROM) Comments: limited elbow flexion/extension - significant edema Active Range of Motion (AROM) Comments: 0 General Strength Comments: 0/5 RUE Body System: Neuro Brunstrum levels for arm and hand: Arm;Hand Brunstrum level for arm: Stage I Presynergy Brunstrum level for hand: Stage I Flaccidity    Care Tool Care Tool Self Care Eating   Eating Assist Level: Total Assistance - Patient < 25% (PO trial with SLP)    Oral Care    Oral Care Assist Level: Moderate Assistance - Patient 50 - 74%    Bathing   Body parts bathed by patient: Chest;Abdomen;Face Body parts bathed by helper: Right arm;Left arm;Front perineal area;Buttocks;Right upper leg;Left upper leg;Right lower leg;Left lower leg   Assist Level: Maximal Assistance - Patient 24 - 49%    Upper Body Dressing(including orthotics)   What is the patient wearing?: Pull over shirt   Assist Level: Total Assistance - Patient < 25%    Lower Body Dressing (excluding footwear)   What is the patient wearing?: Incontinence  brief;Pants Assist for lower body dressing: Total Assistance - Patient < 25%    Putting on/Taking off footwear   What is the patient wearing?: Non-skid slipper socks Assist for footwear: Total Assistance - Patient < 25%       Care Tool Toileting Toileting activity   Assist for toileting: Dependent - Patient 0%     Care Tool Bed Mobility Roll left and right activity   Roll left and right assist level: Moderate Assistance - Patient 50 - 74%    Sit to lying activity Sit to lying activity did not occur: Safety/medical concerns      Lying to sitting edge of bed activity Lying to sitting edge of bed activity did not occur: Safety/medical concerns       Care Tool Transfers Sit to stand transfer Sit to stand activity did not occur: Safety/medical concerns      Chair/bed transfer Chair/bed transfer activity did not occur: Safety/medical concerns       Toilet transfer Toilet transfer activity did not occur: Safety/medical concerns       Care Tool Cognition Expression of Ideas and Wants Expression of Ideas and Wants: Some difficulty - exhibits some difficulty with expressing needs and ideas (e.g, some words or finishing thoughts) or speech is not clear   Understanding Verbal and Non-Verbal Content Understanding Verbal and Non-Verbal Content: Usually understands - understands most conversations, but misses some part/intent of message. Requires cues at times to understand   Memory/Recall Ability *first 3 days only Memory/Recall Ability *first 3 days only: Current season;That he or she is in a hospital/hospital unit    Refer to Care Plan for [place] 1 OT Short Term Goal 1 (Week 1): Pt will be able to sit to EOB with mod A of 1. OT Short Term Goal 2 (Week 1): Pt will be able to maintain sitting balance EOB with mod A of 1. OT Short Term Goal 3 (Week 1): Pt will bathe UB with mod A. OT Short Term Goal 4 (Week 1): Pt will don shirt with mod A. OT Short  Term Goal 5 (Week 1): Pt will be able to find items in his R visual field with min cues.  Recommendations for other services: None    Skilled Therapeutic Intervention ADL ADL Eating: Maximal assistance Grooming: Maximal assistance Upper Body Bathing: Maximal  assistance Where Assessed-Upper Body Bathing: Bed level Lower Body Bathing: Dependent Where Assessed-Lower Body Bathing: Bed level Upper Body Dressing: Maximal assistance Where Assessed-Upper Body Dressing: Bed level Lower Body Dressing: Dependent Where Assessed-Lower Body Dressing: Bed level Toileting: Not assessed Toilet Transfer: Not assessed    Pt seen for initial evaluation which was quite limited today as pt received in bed with a brief filled with soft bowel movement and no +2 A available.  The initial part of the session was used cleansing patient which took quite a while as the bowel had seeped all around his perineal area.  Pt was alert most of the session and did answer questions fairly well even though he has severe dysarthria.  He was able to follow basic directions too.  His wife arrived part of the way through the session.  Pt has a very heavy, flaccid arm with edema around elbow. Discussed this with MD.  Pt will need a sling to support arm during mobility. Pt worked on rolling in bed, bridging with mod A.  Due to heavy limbs and no extra physical assist at this session, did not attempt to sit patient up or transfer him.  Discussed with wife and pt, planned OT POC, ELOS, goals.  Discussed with PT the need for pt to have a TIS wc and for her to try to find +2 A for her session.  Pt resting in bed with all needs met and spouse in the room.    Discharge Criteria: Patient will be discharged from OT if patient refuses treatment 3 consecutive times without medical reason, if treatment goals not met, if there is a change in medical status, if patient makes no progress towards goals or if patient is discharged from hospital.  The  above assessment, treatment plan, treatment alternatives and goals were discussed and mutually agreed upon: by patient and by family  Flay Ghosh 06/17/2020, 12:55 PM

## 2020-06-17 NOTE — Progress Notes (Signed)
Inpatient Rehabilitation  Patient information reviewed and entered into eRehab system by Dariya Gainer M. Gabrian Hoque, M.A., CCC/SLP, PPS Coordinator.  Information including medical coding, functional ability and quality indicators will be reviewed and updated through discharge.    

## 2020-06-17 NOTE — Progress Notes (Addendum)
[facility] PHYSICAL MEDICINE & REHABILITATION PROGRESS NOTE   Subjective/Complaints:  Lethargic, unable to take po.  DIscussed with nsg as well as admitting MD who saw pt again this am and indicated no sig change in mental status   Wife feels speech more slurred and more lethargic than yesterday  Speech eval in progress. Discussed need for NPO status   Pt denies RUE pain, OT concerned about R elbow swelling   ROS- neg CP , SOB, N/V/D  Objective:   No results found. Recent Labs    06/15/20 0232 06/17/20 0457  WBC 8.2 10.9*  HGB 14.9 16.0  HCT 42.9 47.7  PLT 149* 169   Recent Labs    06/15/20 0232 06/17/20 0457  NA 138 136  K 3.8 4.1  CL 107 103  CO2 22 22  GLUCOSE 111* 122*  BUN 15 19  CREATININE 1.01 1.05  CALCIUM 8.7* 8.8*   No intake or output data in the 24 hours ending 06/17/20 7510      Physical Exam: Vital Signs Blood pressure (!) 156/107, pulse 91, temperature 98 F (36.7 C), temperature source Oral, resp. rate 20, height 6\' 2"  (1.88 m), weight 93.8 kg, SpO2 94 %.   General: No acute distress Mood and affect are appropriate Heart: Regular rate and rhythm no rubs murmurs or extra sounds Lungs: Clear to auscultation, breathing unlabored, no rales or wheezes Abdomen: Positive bowel sounds, soft nontender to palpation, nondistended Extremities: No clubbing, cyanosis, or edema Skin: No evidence of breakdown, no evidence of rash Neurologic: severe dysarthria, follows simple commnads, food bolus sits in oral cavity unabe to cough or spit.  , motor strength is 5/5 in left deltoid, bicep, tricep, grip, hip flexor, knee extensors, ankle dorsiflexor and plantar flexor 0/5 right UW, trace R hip/knee ext synergy  Sensory exam normal sensation to light touch and proprioception in bilateral upper and lower extremities  Musculoskeletal: RIght knee valgus deformity, No pain with Right shoulder, elbow , wrist  ROM, mod pitting edema Left elbow, ecchymosis from BP  cuff R forearm    Assessment/Plan: 1. Functional deficits which require 3+ hours per day of interdisciplinary therapy in a comprehensive inpatient rehab setting.  Physiatrist is providing close team supervision and 24 hour management of active medical problems listed below.  Physiatrist and rehab team continue to assess barriers to discharge/monitor patient progress toward functional and medical goals  Care Tool:  Bathing              Bathing assist       Upper Body Dressing/Undressing Upper body dressing   What is the patient wearing?: Hospital gown only    Upper body assist      Lower Body Dressing/Undressing Lower body dressing            Lower body assist       Toileting Toileting    Toileting assist       Transfers Chair/bed transfer  Transfers assist           Locomotion Ambulation   Ambulation assist              Walk 10 feet activity   Assist           Walk 50 feet activity   Assist           Walk 150 feet activity   Assist           Walk 10 feet on uneven surface  activity   Assist  Wheelchair     Assist               Wheelchair 50 feet with 2 turns activity    Assist            Wheelchair 150 feet activity     Assist          Blood pressure (!) 156/107, pulse 91, temperature 98 F (36.7 C), temperature source Oral, resp. rate 20, height 6\' 2"  (1.88 m), weight 93.8 kg, SpO2 94 %.    Medical Problem List and Plan: 1.  Right side weakness with dysphagia as well as dysarthria secondary to acute inferior pontine hemorrhage likely due to hypertension in the setting of Eliquis use as well significant bilateral vertebral artery stenosis             -patient may shower             -ELOS/Goals: 14-21 days [place] swallow dysarthria and mentation repeat CT head 2.  Antithrombotics: -DVT/anticoagulation: SCDs             -antiplatelet therapy: N/A 3. Pain  Management: Continue Tylenol as needed 4. Mood: Provide emotional support             -antipsychotic agents: N/A 5. Neuropsych: This patient is capable of making decisions on his own behalf. 6. Skin/Wound Care: Routine skin checks 7. Fluids/Electrolytes/Nutrition: Routine INO's with follow-up chemistries 8.  Dysphagia. Speech on acute noted lethargy but continued Dysphagia #2 honey thick liquids.Needs to be NPO order cortrak, IVF until cortrak feeding tube is placed  9.  Atrial fibrillation.  Eliquis discontinued due to ICH.  Cardiac rate controlled.  Continue beta-blocker Normal Ej fx 10.  Hypertension.  Norvasc 10 mg daily. Currently elevated. Increase Lopressor to 25mg  TID. Monitor with increased mobility 11.  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  Contact precautions 12.  Prediabetes.  Hemoglobin A1c 6.1.  SSI. CBGs ranging from 98 to 133. Provide dietary education.  13.  GERD.  Continue Protonix  LOS: 1 days A FACE TO FACE EVALUATION WAS PERFORMED  06/17/2020, 8:21 AM

## 2020-06-17 NOTE — Progress Notes (Signed)
Orthopedic Tech Progress Note Patient Details:  [name] 1940/07/28 [id number] Dropped off ARM SLING for patient. Notes says to be worn when out of bed so I asked the secretary to let the RN know. Ortho Devices Type of Ortho Device: Arm sling Ortho Device/Splint Location: RUE Ortho Device/Splint Interventions: Other (comment)   Post Interventions Patient Tolerated: Other (comment) Instructions Provided: Other (comment)   Donald Pore 06/17/2020, 12:06 PM

## 2020-06-17 NOTE — Progress Notes (Signed)
Inpatient Rehabilitation Care Coordinator Assessment and Plan Patient Details  Name: [name] MRN: [id number] Date of Birth: 1940-11-19  Today's Date: 06/17/2020  Hospital Problems: Principal Problem:   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  Past Medical History:  Past Medical History:  Diagnosis Date  . Abdominal aortic aneurysm [facility])    status post endoluminal stent graft at [facility] in 2006  . Abdominal aortic aneurysm ([facility]) 2006  . Arthritis   . Coronary artery disease   . GERD (gastroesophageal reflux disease)   . History of shingles    TIMES 2  . Hyperlipidemia   . Hypertension   . MRSA (methicillin resistant Staphylococcus aureus) infection   . Numbness    right leg from knee down worse when standing   . PONV (postoperative nausea and vomiting)   . Sleep apnea    pt scored 5 per Stop Bang tool at PAT visit 10/26/2015; results sent to [facility] NP  . Vertigo    Past Surgical History:  Past Surgical History:  Procedure Laterality Date  . ABDOMINAL AORTIC ANEURYSM REPAIR W/ ENDOLUMINAL GRAFT     13 years ago   . ANGIOPLASTY    . branch stenting  06/08/2004   Dr. Lavonne Chick  . CARDIAC CATHETERIZATION  06/08/2004  . CARDIOVASCULAR STRESS TEST  02/14/2009  . I&D of left knee     . LUMBAR LAMINECTOMY/DECOMPRESSION MICRODISCECTOMY N/A 11/02/2015   Procedure: MICROLUMBAR DECOMPRESSION L3-L4, L4-L5, AND L5-S1;  Surgeon: Jene Every, MD;  Location: [facility];  Service: Orthopedics;  Laterality: N/A;  . PICC LINE PLACE PERIPHERAL ([facility] HX)    . TRANSTHORACIC ECHOCARDIOGRAM  06/07/2004   Social History:  reports that he has never smoked. He has never used smokeless tobacco. He reports that he does not drink alcohol and does not use drugs.  Family / Support Systems Marital Status: Married Patient Roles: Spouse,Parent Spouse/Significant Other: Liborio Nixon Children: Johnny Bridge Anticipated Caregiver: Spouse and Children Ability/Limitations of  Caregiver: Spouse cnnot physcially assit Caregiver Availability: 24/7  Social History Preferred language: English Religion:  Read: Yes Write: Yes Employment Status: Employed Paramedic)   Abuse/Neglect Abuse/Neglect Assessment Can Be Completed: Yes Physical Abuse: Denies Verbal Abuse: Denies Sexual Abuse: Denies Exploitation of patient/patient's resources: Denies Self-Neglect: Denies  Emotional Status    Patient / Family Perceptions, Expectations & Goals Premorbid pt/family roles/activities: Independent, driving, working Pt/family expectations/goals: Physicist, medical: None Premorbid Home Care/DME Agencies: Other (Comment) ([place], Single [employer], Information systems manager) Transportation available at discharge: Family able to transport  Discharge Planning Living Arrangements: Spouse/significant other,Children Support Systems: Spouse/significant other,Children Type of Residence: Private residence (1 level home, 2 steps to enter (no railings)) Insurance Resources: Electrical engineer Resources: Employment Financial Screen Referred: No Living Expenses: Own Money Management: Patient,Spouse Does the patient have any problems obtaining your medications?: No Home Management: Previously Indpendent Patient/Family Preliminary Plans: Spouse able to assit with medications Care Coordinator Barriers to Discharge: Decreased caregiver support Care Coordinator Anticipated Follow Up Needs: HH/OP,SNF DC Planning Additional Notes/Comments: SNF if does not reach caregiver support Expected length of stay: 14-21 Days  Clinical Impression SW entered patient room, patient asleep. Spouse not at bedside (was at bedside per son-made attempt to call spouse). Sw will follow up with family on Monday.  Andria Rhein 06/17/2020, 12:22 PM

## 2020-06-17 NOTE — Evaluation (Signed)
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 Abdominal aortic aneurysm [facility])    status post endoluminal stent graft at [facility] in 2006  . Abdominal aortic aneurysm ([place]) 2006  . Arthritis   . Coronary artery disease   . GERD (gastroesophageal reflux disease)   . History of shingles    TIMES 2  . Hyperlipidemia   . Hypertension   . MRSA (methicillin resistant Staphylococcus aureus) infection   . Numbness    right leg from knee down worse when standing   . PONV (postoperative nausea and vomiting)   . Sleep apnea    pt scored 5 per Stop Bang tool at PAT visit 10/26/2015; results sent to [facility] NP  . Vertigo    Past Surgical History:  Past Surgical History:  Procedure Laterality Date  . ABDOMINAL AORTIC ANEURYSM REPAIR W/ ENDOLUMINAL GRAFT     13 years ago   . ANGIOPLASTY    . branch stenting  06/08/2004   Dr. Janene Madeira  . CARDIAC CATHETERIZATION  06/08/2004  . CARDIOVASCULAR STRESS TEST  02/14/2009  . I&D of left knee     . LUMBAR LAMINECTOMY/DECOMPRESSION MICRODISCECTOMY N/A 11/02/2015   Procedure: MICROLUMBAR DECOMPRESSION L3-L4, L4-L5, AND L5-S1;  Surgeon: Susa Day, MD;  Location: [facility] ORS;  Service: Orthopedics;  Laterality: N/A;  . PICC LINE PLACE PERIPHERAL ([place] HX)    . TRANSTHORACIC ECHOCARDIOGRAM  06/07/2004    Assessment / Plan / Recommendation Clinical Impression   HPI: [name] is a 79 year old right-handed male with history of atrial fibrillation maintained on Eliquis, AAA status  post stent graft 2006, CAD status post stenting, hypertension hyperlipidemia and OSA.  Per chart review lives with spouse 1 level home 2 steps to entry.  Independent prior to admission with intermittent use of a straight point cane working as a Engineer, building services.  Presented 06/10/2020 with acute onset of right side weakness and dysarthria.  Admission chemistries unremarkable except potassium 3.4, glucose 128, hemoglobin A1c 6.1 troponin negative, alcohol/urine drug screen negative.  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  No mass-effect.  CT angiogram of head and neck bilateral calcified carotid artery atherosclerosis 65 to 70% stenosis of the left ICA bulb.  No hemodynamically significant stenosis on the right.  Suspect severely delayed basilar artery perfusion on the basis of hemodynamically significant bilateral vertebral artery stenosis.  MRI showed 14 mm acute hemorrhage in the left ventral pons without underlying mass or change from prior tracing.  Admission chemistries urine drug screen negative troponin negative MRSA PCR screening positive.  Echocardiogram with ejection fraction 60 to 65% no wall motion abnormalities.  Initially placed on hypertonic saline 3%.  Eliquis has been discontinued due to [place].  Cleviprex initiated for blood pressure control.  Dysphagia #2 honey thick liquids.  Due to patient's acute right side weakness dysarthria he was admitted for a comprehensive rehab program 06/16/20 and SLP evaluations were administered 06/17/20 with results as follows:  Bedside swallow evaluation: Pt presents with s/sx severe oropharyngeal dysphagia this morning. Per MD report, pt with significant  coughing and difficulty orally manipulating his dysphagia 2 (minced/ground) and honey thick liquid texture breakfast, and he ordered pt NPO. When discussing this with pt, he endorsed difficulties and reported he "just couldn't get anything to go down". Pt also with moderate dried secretions and  some loose PO residue on his tongue - thorough oral care via suction toothbrushes provided. During very conservative trials of honey thick water, pt exhibited delayed cough and wet/gurgly vocal quality X1. He also demonstrated very weak oral manipulation and suspect difficulty with AP transit of bolus via tsp. Tsp delivery did minimize anterior loss, but Min labial residue still occurred when pt attempted to transit bolus posteriorly. Would recommend pt continue NPO for now with Cortrak for temporary means of nutrition. Will continue to assess readiness for repeat MBSS to instrumentally assess least restrictive PO diet.   Cognitive-linguistic evaluation: Pt presents with severe dysarthria, although during oral mech exam he appeared to have adequate facial musculature sensation, significant weakness and flaccidity present. Pt also with new breath sounds that suspect is due to flaccid velum - wife at beside reports this is new, and also endorses worsening of dysarthria and fatigue this morning. MD and RN made aware of these reported acute changes in speech, swallow, and fatigue this AM. Pt's attention, basic safety awareness, and problem solving were [facility] during very basic functional tasks today, however, due to his fatigue and time constraints, standardized evaluation not administered. ST will continue to diagnostic treatment of semi-complex to complex cognitive tests in future sessions.   Recommend pt receive skilled ST to address dysphagia, dysarthria, and continued diagnostic treatment of cognition while inpatient in order to ensure diet safety and efficiency as well as maximize functional communication and functional independence prior to discharge.   Skilled Therapeutic Interventions          Bedside swallow and cognitive-linguistic evaluations were administered and results were reviewed with pt (please see above for details regarding the results).   SLP Assessment  Patient will need skilled [facility] during CIR admission    Recommendations  SLP Diet Recommendations: NPO Medication Administration: Via alternative means Oral Care Recommendations: Oral care QID Patient destination: Home Follow up Recommendations: Home Health SLP;24 hour supervision/assistance Equipment Recommended: None recommended by SLP    SLP Frequency 3 to 5 out of 7 days   SLP Duration  SLP Intensity  SLP Treatment/Interventions 4 weeks  Minumum of 1-2 x/day, 30 to 90 minutes  Dysphagia/aspiration precaution training;Patient/family education;Therapeutic Activities;Cueing hierarchy;Functional tasks;Speech/Language facilitation    Pain Pain Assessment Pain Scale: 0-10 Pain Score: 0-No pain  Prior Functioning    SLP Evaluation Cognition Overall Cognitive Status: Difficult to assess Arousal/Alertness: Awake/alert Orientation Level: Oriented X4 Attention: Sustained Sustained Attention: Appears intact Memory: Appears intact Awareness: Appears intact Problem Solving: Appears intact Behaviors: Lability Safety/Judgment: Appears intact  Comprehension Auditory Comprehension Overall Auditory Comprehension: Appears within functional limits for tasks assessed Commands: Within Functional Limits Conversation: Simple Visual Recognition/Discrimination Discrimination: Within Function Limits Reading Comprehension Reading Status: Not tested Expression Expression Primary Mode of Expression: Verbal Verbal Expression Overall Verbal Expression: Appears within functional limits for tasks assessed Written Expression Written Expression: Not tested Oral Motor Oral Motor/Sensory Function Overall Oral Motor/Sensory Function: Severe impairment Facial ROM: Reduced right;Suspected CN VII (facial) dysfunction Facial Symmetry: Abnormal symmetry right;Suspected CN VII (facial) dysfunction Facial Strength: Reduced right;Suspected CN VII (facial) dysfunction Facial Sensation: Within  Functional Limits Lingual ROM: Reduced right;Suspected CN XII (hypoglossal) dysfunction Lingual Symmetry: Abnormal symmetry right;Suspected CN XII (  hypoglossal) dysfunction Lingual Strength: Reduced;Suspected CN XII (hypoglossal) dysfunction Lingual Sensation: Within Functional Limits Velum: Suspected CN X (Vagus) dysfunction Mandible: Within Functional Limits Motor Speech Overall Motor Speech: Impaired Respiration: Impaired Level of Impairment: Phrase Phonation: Normal Resonance: Hypernasality Articulation: Impaired Level of Impairment: Word Intelligibility: Intelligibility reduced Word: 50-74% accurate Phrase: 50-74% accurate Sentence: 25-49% accurate Conversation: 25-49% accurate Motor Planning: Witnin functional limits Motor Speech Errors: Aware Interfering Components: Inadequate dentition Effective Techniques: Over-articulate;Increased vocal intensity  Care Tool Care Tool Cognition Expression of Ideas and Wants Expression of Ideas and Wants: Some difficulty - exhibits some difficulty with expressing needs and ideas (e.g, some words or finishing thoughts) or speech is not clear   Understanding Verbal and Non-Verbal Content Understanding Verbal and Non-Verbal Content: Usually understands - understands most conversations, but misses some part/intent of message. Requires cues at times to understand   Memory/Recall Ability *first 3 days only Memory/Recall Ability *first 3 days only: Current season;That he or she is in a hospital/hospital unit     PMSV Assessment  PMSV Trial Intelligibility: Intelligibility reduced Word: 50-74% accurate Phrase: 50-74% accurate Sentence: 25-49% accurate Conversation: 25-49% accurate  Bedside Swallowing Assessment General Date of Onset: 06/10/20 Previous Swallow Assessment: BSE 12/17, MBS 12/18 Diet Prior to this Study: NPO Temperature Spikes Noted: No Respiratory Status: Room air History of Recent Intubation: No Behavior/Cognition:  Cooperative;Pleasant mood;Lethargic/Drowsy Oral Cavity - Dentition: Missing dentition;Poor condition Self-Feeding Abilities: Needs assist Vision: Impaired for self-feeding Patient Positioning: Upright in bed Baseline Vocal Quality: Normal Volitional Cough: Weak Volitional Swallow: Unable to elicit  Oral Care Assessment Does patient have any of the following "high(er) risk" factors?: Saliva - insufficient, absent Does patient have any of the following "at risk" factors?: Other - dysphagia;Saliva - thick, dry mouth;Nutritional status - inadequate;Tongue - coated;Lips - dry, cracked Patient is HIGH RISK: Non-ventilated: Order set for Adult Oral Care Protocol initiated - "High Risk Patients - Non-Ventilated" option selected  (see row information) Patient is AT RISK: Order set for Adult Oral Care Protocol initiated -  "At Risk Patients" option selected (see row information) Ice Chips Ice chips: Not tested Thin Liquid Thin Liquid: Not tested Nectar Thick Nectar Thick Liquid: Not tested Honey Thick Honey Thick Liquid: Impaired Presentation: Spoon Oral Phase Impairments: Reduced labial seal;Reduced lingual movement/coordination;Poor awareness of bolus Oral Phase Functional Implications: Prolonged oral transit Pharyngeal Phase Impairments: Wet Vocal Quality;Throat Clearing - Delayed Puree Puree: Not tested Solid Solid: Not tested BSE Assessment Risk for Aspiration Impact on safety and function: Moderate aspiration risk Other Related Risk Factors: History of GERD;Lethargy  Short Term Goals: Week 1: SLP Short Term Goal 1 (Week 1): Pt will participate in PO trials with improvements in ability orally manipulate bolus and reduction in overt s/sx aspriation to demosntrate readiness for repeat MBSS to determine least restrictive PO diet. SLP Short Term Goal 2 (Week 1): Pt will utilize compensatory strategies to increase speech intelligibility to ~75% at the word and phrase level with Mod A  cues. SLP Short Term Goal 3 (Week 1): Pt will participate in formal cognitive evaluation to further assess cognitive function with semi-complex to complex tasks.  Refer to Care Plan for Long Term Goals  Recommendations for other services: None   Discharge Criteria: Patient will be discharged from SLP if patient refuses treatment 3 consecutive times without medical reason, if treatment goals not met, if there is a change in medical status, if patient makes no progress towards goals or if patient is discharged from hospital.  The above  assessment, treatment plan, treatment alternatives and goals were discussed and mutually agreed upon: by patient and his wife  Arbutus Leas 06/17/2020, 12:14 PM

## 2020-06-17 NOTE — Progress Notes (Signed)
Inpatient Rehabilitation Center Individual Statement of Services  Patient Name:  [name]  Date:  06/17/2020  Welcome to the Inpatient Rehabilitation Center.  Our goal is to provide you with an individualized program based on your diagnosis and situation, designed to meet your specific needs.  With this comprehensive rehabilitation program, you will be expected to participate in at least 3 hours of rehabilitation therapies Monday-Friday, with modified therapy programming on the weekends.  Your rehabilitation program will include the following services:  Physical Therapy (PT), Occupational Therapy (OT), Speech Therapy (ST), 24 hour per day rehabilitation nursing, Therapeutic Recreaction (TR), Neuropsychology, Care Coordinator, Rehabilitation Medicine, Nutrition Services, Pharmacy Services and Other  Weekly team conferences will be held on Wednesday to discuss your progress.  Your Inpatient Rehabilitation Care Coordinator will talk with you frequently to get your input and to update you on team discussions.  Team conferences with you and your family in attendance may also be held.  Expected length of stay: 14-21 Days   Overall anticipated outcome: Min A  Depending on your progress and recovery, your program may change. Your Inpatient Rehabilitation Care Coordinator will coordinate services and will keep you informed of any changes. Your Inpatient Rehabilitation Care Coordinator's name and contact numbers are listed  below.  The following services may also be recommended but are not provided by the Inpatient Rehabilitation Center:    Home Health Rehabiltiation Services  Outpatient Rehabilitation Services    Arrangements will be made to provide these services after discharge if needed.  Arrangements include referral to agencies that provide these services.  Your insurance has been verified to be:  Medicare A&B Your primary doctor is:  Mauricio Po, NP  Pertinent information will be shared  with your doctor and your insurance company.  Inpatient Rehabilitation Care Coordinator:  Lavera Guise, [place] [phone] or [phone]  Information discussed with and copy given to patient by: Andria Rhein, 06/17/2020, 10:44 AM

## 2020-06-18 LAB — GLUCOSE, CAPILLARY
Glucose-Capillary: 119 mg/dL — ABNORMAL HIGH (ref 70–99)
Glucose-Capillary: 130 mg/dL — ABNORMAL HIGH (ref 70–99)
Glucose-Capillary: 142 mg/dL — ABNORMAL HIGH (ref 70–99)
Glucose-Capillary: 155 mg/dL — ABNORMAL HIGH (ref 70–99)
Glucose-Capillary: 160 mg/dL — ABNORMAL HIGH (ref 70–99)
Glucose-Capillary: 183 mg/dL — ABNORMAL HIGH (ref 70–99)

## 2020-06-18 LAB — PHOSPHORUS
Phosphorus: 2.6 mg/dL (ref 2.5–4.6)
Phosphorus: 2.7 mg/dL (ref 2.5–4.6)

## 2020-06-18 LAB — MAGNESIUM
Magnesium: 1.7 mg/dL (ref 1.7–2.4)
Magnesium: 1.6 mg/dL — ABNORMAL LOW (ref 1.7–2.4)

## 2020-06-18 MED ORDER — CHLORHEXIDINE GLUCONATE 0.12 % MT SOLN
15.0000 mL | Freq: Two times a day (BID) | OROMUCOSAL | Status: DC
  Administered 2020-06-18 – 2020-07-08 (×39): 15 mL via OROMUCOSAL
  Filled 2020-06-18 (×40): qty 15

## 2020-06-18 MED ORDER — ORAL CARE MOUTH RINSE
15.0000 mL | Freq: Two times a day (BID) | OROMUCOSAL | Status: DC
  Administered 2020-06-18 – 2020-07-08 (×37): 15 mL via OROMUCOSAL

## 2020-06-18 MED ORDER — SENNOSIDES 8.8 MG/5ML PO SYRP
5.0000 mL | ORAL_SOLUTION | Freq: Two times a day (BID) | ORAL | Status: DC
  Administered 2020-06-18 – 2020-06-29 (×21): 5 mL via ORAL
  Filled 2020-06-18 (×26): qty 5

## 2020-06-18 MED ORDER — AMLODIPINE BESYLATE 10 MG PO TABS
10.0000 mg | ORAL_TABLET | Freq: Every day | ORAL | Status: DC
  Administered 2020-06-18 – 2020-07-08 (×19): 10 mg
  Filled 2020-06-18 (×21): qty 1

## 2020-06-18 MED ORDER — ORAL CARE MOUTH RINSE: 15.0000 mL | Freq: Two times a day (BID) | OROMUCOSAL | Status: DC

## 2020-06-18 MED ORDER — PANTOPRAZOLE SODIUM 40 MG PO PACK
40.0000 mg | PACK | Freq: Every day | ORAL | Status: DC
  Administered 2020-06-18 – 2020-07-07 (×19): 40 mg
  Filled 2020-06-18 (×16): qty 20

## 2020-06-18 MED ORDER — METOPROLOL TARTRATE 50 MG PO TABS
50.0000 mg | ORAL_TABLET | Freq: Three times a day (TID) | ORAL | Status: DC
  Administered 2020-06-18 – 2020-07-08 (×59): 50 mg
  Filled 2020-06-18 (×59): qty 1

## 2020-06-18 NOTE — Progress Notes (Signed)
Received patient asleep he will open his eyes when his name is called but goes back to sleep.Meds given thru tube.

## 2020-06-18 NOTE — Progress Notes (Signed)
Wife called re: result of CT scan. MD made aware and he said result looks good ; no new stroke and no change. MD said ok to give results to wife. RN called wife back.

## 2020-06-18 NOTE — Progress Notes (Signed)
PHYSICAL MEDICINE & REHABILITATION PROGRESS NOTE   Subjective/Complaints:  Reviewed CT head as well as CBC BMET,   ROS- neg CP , SOB, N/V/D  Objective:   CT HEAD WO CONTRAST  Result Date: 06/17/2020 CLINICAL DATA:  Stroke follow-up. Worsened swallowing and dysarthria. EXAM: CT HEAD WITHOUT CONTRAST TECHNIQUE: Contiguous axial images were obtained from the base of the skull through the vertex without intravenous contrast. COMPARISON:  06/11/2020 FINDINGS: Brain: A 1.5 cm hemorrhage in the ventral pons has not significantly changed in size but demonstrates mildly decreased density consistent with evolving blood products. There is only mild surrounding edema. No new intracranial hemorrhage, acute infarct, mass, midline shift, or extra-axial fluid collection is identified. 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 Patchy hypodensities in the cerebral white matter bilaterally are unchanged and nonspecific but compatible with moderate chronic small vessel ischemic disease. There is moderate cerebral atrophy. Vascular: Calcified atherosclerosis at the skull base. Generalized intracranial arterial dolichoectasia. Skull: No fracture or suspicious osseous lesion. Sinuses/Orbits: Paranasal sinuses and mastoid air cells are clear. Unremarkable orbits. Other: None. IMPRESSION: 1. Unchanged size of pontine hemorrhage. 2. No evidence of new intracranial abnormality. 3. Moderate chronic small vessel ischemic disease. Electronically Signed   By: Sebastian Ache M.D.   On: 06/17/2020 19:41   Recent Labs    06/17/20 0457  WBC 10.9*  HGB 16.0  HCT 47.7  PLT 169   Recent Labs    06/17/20 0457  NA 136  K 4.1  CL 103  CO2 22  GLUCOSE 122*  BUN 19  CREATININE 1.05  CALCIUM 8.8*    Intake/Output Summary (Last 24 hours) at 06/18/2020 1018 Last data filed at 06/18/2020 0300 Gross per 24 hour  Intake 680 ml  Output --  Net 680 ml        Physical Exam: Vital  Signs Blood pressure (!) 142/85, pulse 75, temperature 99 F (37.2 C), temperature source Oral, resp. rate 20, height 6\' 2"  (1.88 m), weight 94.7 kg, SpO2 94 %.   General: No acute distress Mood and affect are appropriate Heart: Regular rate and rhythm no rubs murmurs or extra sounds Lungs: Clear to auscultation, breathing unlabored, no rales or wheezes Abdomen: Positive bowel sounds, soft nontender to palpation, nondistended Extremities: No clubbing, cyanosis, or edema Skin: No evidence of breakdown, no evidence of rash Neurologic: severe dysarthria,lethargic but awakens to voice   follows simple commnads, food bolus sits in oral cavity unabe to cough or spit.  , motor strength is 5/5 in left deltoid, bicep, tricep, grip, hip flexor, knee extensors, ankle dorsiflexor and plantar flexor 0/5 right UW, trace R hip/knee ext synergy  Sensory exam normal sensation to light touch and proprioception in bilateral upper and lower extremities  Musculoskeletal: RIght knee valgus deformity, No pain with Right shoulder, elbow , wrist  ROM, mod pitting edema Left elbow, ecchymosis from BP cuff R forearm    Assessment/Plan: 1. Functional deficits which require 3+ hours per day of interdisciplinary therapy in a comprehensive inpatient rehab setting.  Physiatrist is providing close team supervision and 24 hour management of active medical problems listed below.  Physiatrist and rehab team continue to assess barriers to discharge/monitor patient progress toward functional and medical goals  Care Tool:  Bathing    Body parts bathed by patient: Chest,Abdomen,Face   Body parts bathed by helper: Right arm,Left arm,Front perineal area,Buttocks,Right upper leg,Left upper leg,Right lower leg,Left lower leg     Bathing assist Assist  Level: Maximal Assistance - Patient 24 - 49%     Upper Body Dressing/Undressing Upper body dressing   What is the patient wearing?: Pull over shirt    Upper body assist  Assist Level: Total Assistance - Patient < 25%    Lower Body Dressing/Undressing Lower body dressing      What is the patient wearing?: Underwear/pull up,Pants     Lower body assist Assist for lower body dressing: Total Assistance - Patient < 25%     Toileting Toileting    Toileting assist Assist for toileting: Dependent - Patient 0%     Transfers Chair/bed transfer  Transfers assist  Chair/bed transfer activity did not occur: Safety/medical concerns        Locomotion Ambulation   Ambulation assist   Ambulation activity did not occur: Safety/medical concerns          Walk 10 feet activity   Assist  Walk 10 feet activity did not occur: Safety/medical concerns        Walk 50 feet activity   Assist Walk 50 feet with 2 turns activity did not occur: Safety/medical concerns         Walk 150 feet activity   Assist Walk 150 feet activity did not occur: Safety/medical concerns         Walk 10 feet on uneven surface  activity   Assist Walk 10 feet on uneven surfaces activity did not occur: Safety/medical concerns         Wheelchair     Assist Will patient use wheelchair at discharge?: Yes Type of Wheelchair:  (TBD) Wheelchair activity did not occur: Safety/medical concerns         Wheelchair 50 feet with 2 turns activity    Assist    Wheelchair 50 feet with 2 turns activity did not occur: Safety/medical concerns       Wheelchair 150 feet activity     Assist  Wheelchair 150 feet activity did not occur: Safety/medical concerns       Blood pressure (!) 142/85, pulse 75, temperature 99 F (37.2 C), temperature source Oral, resp. rate 20, height 6\' 2"  (1.88 m), weight 94.7 kg, SpO2 94 %.    Medical Problem List and Plan: 1.  Right side weakness with dysphagia as well as dysarthria secondary to acute inferior pontine hemorrhage likely due to hypertension in the setting of Eliquis use as well significant bilateral  vertebral artery stenosis             -patient may shower             -ELOS/Goals: 14-21 days CT head shows no new infarcts or bleeds , unchanged size of left pontine hemorrhage  2.  Antithrombotics: -DVT/anticoagulation: SCDs             -antiplatelet therapy: N/A 3. Pain Management: Continue Tylenol as needed 4. Mood: Provide emotional support             -antipsychotic agents: N/A 5. Neuropsych: This patient is capable of making decisions on his own behalf. 6. Skin/Wound Care: Routine skin checks 7. Fluids/Electrolytes/Nutrition: Routine INO's with follow-up chemistries, BMET normal 12/24 8.  Dysphagia. Speech on acute noted lethargy but continued Dysphagia #2 honey thick liquids.Needs to be NPO order cortrak, IVF until cortrak feeding tube is placed  9.  Atrial fibrillation.  Eliquis discontinued due to ICH.  Cardiac rate controlled.  Continue beta-blocker Normal Ej fx 10.  Hypertension.  Norvasc 10 mg daily. Currently elevated. Increase Lopressor to  25mg  TID. Monitor with increased mobility 11.  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  Contact precautions 12.  Prediabetes.  Hemoglobin A1c 6.1.  SSI. CBGs ranging from 98 to 133. Provide dietary education.  13.  GERD.  Continue Protonix  LOS: 2 days A FACE TO FACE EVALUATION WAS PERFORMED  06/18/2020, 10:18 AM

## 2020-06-18 NOTE — Progress Notes (Signed)
Patient arrived to unit via hospital bed with transfer nurse and tech. Patient presents sleep but arousable.

## 2020-06-18 NOTE — IPOC Note (Signed)
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     Functional Problem List: Nursing Bladder,Bowel,Sensory,Motor,Medication Management,Skin Integrity,Pain,Safety  PT Balance,Edema,Endurance,Motor,Perception,Safety,Sensory  OT Balance,Cognition,Edema,Endurance,Motor,Nutrition,Perception,Sensory,Skin Integrity,Vision  SLP Linguistic,Nutrition  TR         Basic ADL's: OT Eating,Grooming,Bathing,Dressing,Toileting     Advanced  ADL's: OT       Transfers: PT Bed Mobility,Bed to [place]  OT Toilet,Tub/Shower     Locomotion: PT Ambulation,Wheelchair Mobility,Stairs     Additional Impairments: OT Fuctional Use of Upper Extremity  SLP Swallowing,Communication expression    TR      Anticipated Outcomes Item Anticipated Outcome  Self Feeding supervision  Swallowing  Supervision   Basic self-care  MIn A  Toileting  Min A   Bathroom Transfers Min A  Bowel/Bladder  patient will have elimination needs met  Transfers  min A  Locomotion  min A with LRAD  Communication  Supervision  Cognition     Pain  Pain will be managed at an acceptable level  Safety/Judgment  patient will have no falls with injury while on CIR   Therapy Plan: PT Intensity: Minimum of 1-2 x/day ,45 to 90 minutes PT Frequency: 5 out of 7 days PT Duration Estimated Length of Stay: 28-30 days OT Intensity: Minimum of 1-2 x/day, 45 to 90 minutes OT Frequency: 5 out of 7 days OT Duration/Estimated Length of Stay: 28-30 days SLP Intensity: Minumum of 1-2 x/day, 30 to 90 minutes SLP Frequency: 3 to 5 out of 7 days SLP Duration/Estimated Length of Stay: 4 weeks   Due to the current state of emergency, patients may not be receiving their 3-hours of Medicare-mandated  therapy.   Team Interventions: Nursing Interventions Dysphagia/Aspiration Precaution Training  PT interventions Ambulation/gait training,Balance/vestibular training,Cognitive remediation/compensation,Community reintegration,Discharge planning,Disease management/prevention,DME/adaptive equipment instruction,Functional electrical stimulation,Functional mobility training,Neuromuscular re-education,Pain management,Patient/family education,Psychosocial support,Splinting/orthotics,Stair training,Therapeutic Activities,Therapeutic Exercise,UE/LE Strength taining/ROM,UE/LE Coordination activities,Visual/perceptual remediation/compensation,Wheelchair propulsion/positioning  OT Interventions Balance/vestibular training,Cognitive remediation/compensation,Discharge planning,Functional mobility training,DME/adaptive equipment instruction,Functional electrical stimulation,Neuromuscular re-education,Patient/family education,Psychosocial support,Therapeutic Activities,Splinting/orthotics,Skin care/wound managment,Self Care/advanced ADL retraining,Therapeutic Exercise,UE/LE Strength taining/ROM,UE/LE Coordination activities,Visual/perceptual remediation/compensation  SLP Interventions Dysphagia/aspiration precaution training,Patient/family education,Therapeutic Activities,Cueing hierarchy,Functional tasks,Speech/Language facilitation  TR Interventions    SW/CM Interventions Discharge Planning,Psychosocial Support,Patient/Family Education   Barriers to Discharge MD  Medical stability and Nutritional means  Nursing Nutrition means    PT Decreased caregiver support,Home environment access/layout,Incontinence,Lack of/limited family support    OT      SLP      SW Decreased caregiver support     Team Discharge Planning: Destination: PT-Home ,OT- Home , SLP-Home Projected Follow-up: PT-Home health PT,24 hour supervision/assistance, OT-  Home health OT, SLP-Home Health SLP,24 hour supervision/assistance Projected  Equipment Needs: PT-Wheelchair (measurements),Wheelchair cushion (measurements), OT- To be determined, SLP-None recommended by SLP Equipment Details: PT-TBD pending progress, OT-  Patient/family involved in discharge planning: PT- Patient,  OT-Patient,Family member/caregiver, SLP-Patient,Family member/caregiver  MD ELOS: 25-30d Medical Rehab Prognosis:  Good Assessment:   79 year old right-handed male with history of atrial fibrillation maintained on Eliquis, AAA status post stent graft 2006, CAD status post stenting, hypertension hyperlipidemia and OSA.  Per chart review lives with spouse 1 level home 2 steps to entry.  Independent prior to admission with intermittent use of a straight point cane working as a Engineer, building services.  Presented 06/10/2020 with acute onset of right side weakness and dysarthria.  Admission chemistries unremarkable except potassium  3.4, glucose 128, hemoglobin A1c 6.1 troponin negative, alcohol/urine drug screen negative.  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  No mass-effect.  CT angiogram of head and neck bilateral calcified carotid artery atherosclerosis 65 to 70% stenosis of the left ICA bulb.  No hemodynamically significant stenosis on the right.  Suspect severely delayed basilar artery perfusion on the basis of hemodynamically significant bilateral vertebral artery stenosis.  MRI showed 14 mm acute hemorrhage in the left ventral pons without underlying mass or change from prior tracing.  Admission chemistries urine drug screen negative troponin negative MRSA PCR screening positive.  Echocardiogram with ejection fraction 60 to 65% no wall motion abnormalities.  Initially placed on hypertonic saline 3%.  Eliquis has been discontinued due to [place].  Cleviprex initiated for blood pressure control.  Dysphagia #2 honey thick liquids.  Due to patient's acute right side weakness dysarthria he was admitted for a comprehensive rehab program.   Now requiring 24/7  Rehab RN,MD, as well as CIR level PT, OT and SLP.  Treatment team will focus on ADLs and mobility with goals set at [facility]   See Team Conference Notes for weekly updates to the plan of care

## 2020-06-18 NOTE — Plan of Care (Signed)
?  Problem: RH BOWEL ELIMINATION ?Goal: RH STG MANAGE BOWEL WITH ASSISTANCE ?Description: STG Manage Bowel with mod I  Assistance. ?Outcome: Not Progressing;  incontinence ?  ?Problem: RH BLADDER ELIMINATION ?Goal: RH STG MANAGE BLADDER WITH ASSISTANCE ?Description: STG Manage Bladder With  mod I Assistance ?Outcome: Not Progressing; incontinence ?  ?

## 2020-06-19 ENCOUNTER — Inpatient Hospital Stay (HOSPITAL_COMMUNITY): Payer: Medicare Other | Admitting: Physical Therapy

## 2020-06-19 ENCOUNTER — Inpatient Hospital Stay (HOSPITAL_COMMUNITY): Payer: Medicare Other | Admitting: Occupational Therapy

## 2020-06-19 ENCOUNTER — Inpatient Hospital Stay (HOSPITAL_COMMUNITY): Payer: Medicare Other

## 2020-06-19 LAB — GLUCOSE, CAPILLARY
Glucose-Capillary: 118 mg/dL — ABNORMAL HIGH (ref 70–99)
Glucose-Capillary: 131 mg/dL — ABNORMAL HIGH (ref 70–99)
Glucose-Capillary: 136 mg/dL — ABNORMAL HIGH (ref 70–99)
Glucose-Capillary: 143 mg/dL — ABNORMAL HIGH (ref 70–99)
Glucose-Capillary: 146 mg/dL — ABNORMAL HIGH (ref 70–99)
Glucose-Capillary: 153 mg/dL — ABNORMAL HIGH (ref 70–99)

## 2020-06-19 NOTE — Plan of Care (Signed)
?  Problem: RH BOWEL ELIMINATION ?Goal: RH STG MANAGE BOWEL WITH ASSISTANCE ?Description: STG Manage Bowel with mod I  Assistance. ?Outcome: Not Progressing;  incontinence ?  ?Problem: RH BLADDER ELIMINATION ?Goal: RH STG MANAGE BLADDER WITH ASSISTANCE ?Description: STG Manage Bladder With  mod I Assistance ?Outcome: Not Progressing; incontinence ?  ?

## 2020-06-19 NOTE — Progress Notes (Signed)
Physical Therapy Session Note  Patient Details  Name: [name] MRN: [id number] Date of Birth: Oct 09, 1940  Today's Date: 06/19/2020 PT Individual Time: 2446-9507 PT Individual Time Calculation (min): 59 min   Short Term Goals: Week 1:  PT Short Term Goal 1 (Week 1): Pt will maintain static sitting balance with min A x 5 min PT Short Term Goal 2 (Week 1): Pt will perform least restrictive transfer OOB with assist x 2 PT Short Term Goal 3 (Week 1): Pt will initiate w/c mobility PT Short Term Goal 4 (Week 1): Pt will tolerate sitting OOB x 1 hour  Skilled Therapeutic Interventions/Progress Updates:   Pt received supine in bed and agreeable to PT.  PT obtained SB to allow safe transfer to [place]. Supine>sit transfer with max assist and max cues for midline and use of LUE to stabilize trunk with Support on foot board. SB transfer to [facility] with total A +2 with max cues for improved anterior weight shift to lift gluteal surface off SB and protect skin. Pt transported to rehab gym in [facility]. PT adjusted BLE foot rests to improved pressure relief on ischial tuberosities as well as sacrum. Sit<>stand at rail in hall x 2 with max-total A to stabilize the RLE as well as perform lateral weight shift to the R. Cues for attention to mirror for visual feedback to improve midline orientation as well as improve trunk extension. Pt able to tolerate up to 30 sec in standing eachbout with prolonged therapeutic rest breaks following each transfer.  Pt returned to room and performed SB transfer to bed with Total A +2. Sit>supine completed with total A +2 and left supine in bed with call bell in reach and all needs met.         Therapy Documentation Precautions:  Precautions Precautions: Fall Precaution Comments: R hemiplegia with subluxation Restrictions Weight Bearing Restrictions: No Pain: denies   Therapy/Group: Individual Therapy  Lorie Phenix 06/19/2020, 2:10 PM

## 2020-06-19 NOTE — Progress Notes (Signed)
Occupational Therapy Session Note  Patient Details  Name: [name] MRN: [id number] Date of Birth: Apr 06, 1941  Today's Date: 06/19/2020 OT Individual Time: 1002-1056 OT Individual Time Calculation (min): 54 min   Short Term Goals: Week 1:  OT Short Term Goal 1 (Week 1): Pt will be able to sit to EOB with mod A of 1. OT Short Term Goal 2 (Week 1): Pt will be able to maintain sitting balance EOB with mod A of 1. OT Short Term Goal 3 (Week 1): Pt will bathe UB with mod A. OT Short Term Goal 4 (Week 1): Pt will don shirt with mod A. OT Short Term Goal 5 (Week 1): Pt will be able to find items in his R visual field with min cues.  Skilled Therapeutic Interventions/Progress Updates:    Pt greeted in bed with no c/o pain. Per RN, pt more alert today. Pt reported he already completed bathing with nursing staff earlier and that brief was clean. RN and NT confirmed that this was true. Oral care also already completed per RN. Pt agreeable to get dressed during session. Started with applying lotion to lower legs, pt able to tolerate figure 4 position with the Rt LE for hip stretch given total facilitation. Pt able to assist with doffing gripper sock and applying lotion onto the affected LE. Decreased hip flexibility noted on the Lt side with pt unable to assist with doffing his sock. Hemi strategies utilized for donning his pants after Teds were donned given Total A. Pt able to assist with threading pants onto both legs but still needed Max A. With Rt LE stabilized, pt able to bridge in order for OT and RT to elevate pants a little over hips. Pt ultimately needing to roll and +2 to fully elevate pants. Heavy +2 assist for supine<sit with pt exhibiting strong posterior/Lt lean initially. OT sat in a chair across from pt and he placed his Lt arm onto the armrest. Worked on anterior weight shifting/upright neutral midline by mirroring therapist's posture given verbal and manual cuing. Pt able to maintain static  sitting balance for close supervision-CGA for at max 5 minutes with his Rt/Lt hands beside him on the mattress. Note that at times he would suddenly lose his balance Lt, Rt, or posteriorly. Able to assist in balance recovery given cues and mod manual assist. +2 present for safety for duration of pt sitting EOB for safety. Pt completed grooming tasks with Max-Total A and Max A to don overhead shirt + arm sling. Pt returned to bed with +2 assist and +2 for boosting pt up. Positioned him in sidelying position on the Lt side for pressure relief. Left him with all needs within reach and bed alarm set. Tx focus placed on sitting balance, ADL retraining, functional cognition, and activity tolerance.   Note that pt was A+Ox4 today. When we talked about his family, pt also became very teary so emotional support was provided.     Therapy Documentation Precautions:  Precautions Precautions: Fall Precaution Comments: R hemiplegia with subluxation Restrictions Weight Bearing Restrictions: No ADL: ADL Eating: Maximal assistance Grooming: Maximal assistance Upper Body Bathing: Maximal assistance Where Assessed-Upper Body Bathing: Bed level Lower Body Bathing: Dependent Where Assessed-Lower Body Bathing: Bed level Upper Body Dressing: Maximal assistance Where Assessed-Upper Body Dressing: Bed level Lower Body Dressing: Dependent Where Assessed-Lower Body Dressing: Bed level Toileting: Not assessed Toilet Transfer: Not assessed     Therapy/Group: Individual Therapy  [name] A [name] 06/19/2020, 2:28 PM

## 2020-06-19 NOTE — Progress Notes (Signed)
Speech Language Pathology Daily Session Note  Patient Details  Name: [name] MRN: [id number] Date of Birth: 13-Jan-1941  Today's Date: 06/19/2020 SLP Individual Time: 1116-1200 SLP Individual Time Calculation (min): 44 min  Short Term Goals: Week 1: SLP Short Term Goal 1 (Week 1): Pt will participate in PO trials with improvements in ability orally manipulate bolus and reduction in overt s/sx aspriation to demosntrate readiness for repeat MBSS to determine least restrictive PO diet. SLP Short Term Goal 2 (Week 1): Pt will utilize compensatory strategies to increase speech intelligibility to ~75% at the word and phrase level with Mod A cues. SLP Short Term Goal 3 (Week 1): Pt will demonstrate recall of novel and daily information with mod A verbal cues. SLP Short Term Goal 4 (Week 1): Pt will demonstrate sustained attention and alertness in 10 minute intervals with min A verbal cues for redirection.  Skilled Therapeutic Interventions:Skilled ST services focused on swallow and cognitive skills. SLP facilitated continued assessment of cognitive linguistic skills, administered SLUMS. Pt scored 12 out 30, with, limited ability to preform clock drawing task and noted deficits in short term recall and sustained attention. Pt expressed continued fatigue and reduced alertness intermittently throughout session. Pt demonstrated good awareness of general deficits and appropriate word finding abilities at the word/phrase level. SLP provided oral care prior to x2 TSP honey thick liquid trials. Pt demonstrated mild anterior spillage, lingual manipulation appeared minimal and swallow appeared mildly delayed. Pt demonstrated immediate cough with trials with productive cough. SLP suctioned secretions out of oral cavity. Pt was left in room with call bell within reach and bed alarm set. SLP recommends to continue skilled services. SLP added memory and attention goals at this time, continue cognitive linguistic  assessment is recommended.       Pain Pain Assessment Pain Score: 0-No pain  Therapy/Group: Individual Therapy  Rohan Juenger  [facility] 06/19/2020, 3:50 PM

## 2020-06-19 NOTE — Progress Notes (Signed)
[place] PHYSICAL MEDICINE & REHABILITATION PROGRESS NOTE   Subjective/Complaints:  Pt somnolent and severely dysarthric, unchanged   ROS- neg CP , SOB, N/V/D  Objective:   CT HEAD WO CONTRAST  Result Date: 06/17/2020 CLINICAL DATA:  Stroke follow-up. Worsened swallowing and dysarthria. EXAM: CT HEAD WITHOUT CONTRAST TECHNIQUE: Contiguous axial images were obtained from the base of the skull through the vertex without intravenous contrast. COMPARISON:  06/11/2020 FINDINGS: Brain: A 1.5 cm hemorrhage in the ventral pons has not significantly changed in size but demonstrates mildly decreased density consistent with evolving blood products. There is only mild surrounding edema. No new intracranial hemorrhage, acute infarct, mass, midline shift, or extra-axial fluid collection is identified. 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 Patchy hypodensities in the cerebral white matter bilaterally are unchanged and nonspecific but compatible with moderate chronic small vessel ischemic disease. There is moderate cerebral atrophy. Vascular: Calcified atherosclerosis at the skull base. Generalized intracranial arterial dolichoectasia. Skull: No fracture or suspicious osseous lesion. Sinuses/Orbits: Paranasal sinuses and mastoid air cells are clear. Unremarkable orbits. Other: None. IMPRESSION: 1. Unchanged size of pontine hemorrhage. 2. No evidence of new intracranial abnormality. 3. Moderate chronic small vessel ischemic disease. Electronically Signed   By: Sebastian Ache M.D.   On: 06/17/2020 19:41   Recent Labs    06/17/20 0457  WBC 10.9*  HGB 16.0  HCT 47.7  PLT 169   Recent Labs    06/17/20 0457  NA 136  K 4.1  CL 103  CO2 22  GLUCOSE 122*  BUN 19  CREATININE 1.05  CALCIUM 8.8*   No intake or output data in the 24 hours ending 06/19/20 0843      Physical Exam: Vital Signs Blood pressure (!) 142/75, pulse 75, temperature 98.6 F (37 C), temperature  source Oral, resp. rate 20, height 6\' 2"  (1.88 m), weight 94.4 kg, SpO2 93 %.  General: No acute distress Mood and affect are appropriate Heart: Regular rate and rhythm no rubs murmurs or extra sounds Lungs: Clear to auscultation, breathing unlabored, no rales or wheezes Abdomen: Positive bowel sounds, soft nontender to palpation, nondistended Extremities: No clubbing, cyanosis, or edema Skin: No evidence of breakdown, no evidence of rash    , motor strength is 5/5 in left deltoid, bicep, tricep, grip, hip flexor, knee extensors, ankle dorsiflexor and plantar flexor 0/5 right UW, trace R hip/knee ext synergy  Sensory exam normal sensation to light touch and proprioception in bilateral upper and lower extremities  Musculoskeletal: RIght knee valgus deformity, No pain with Right shoulder, elbow , wrist  ROM, mod pitting edema Left elbow, ecchymosis from BP cuff R forearm    Assessment/Plan: 1. Functional deficits which require 3+ hours per day of interdisciplinary therapy in a comprehensive inpatient rehab setting.  Physiatrist is providing close team supervision and 24 hour management of active medical problems listed below.  Physiatrist and rehab team continue to assess barriers to discharge/monitor patient progress toward functional and medical goals  Care Tool:  Bathing    Body parts bathed by patient: Chest,Abdomen,Face   Body parts bathed by helper: Right arm,Left arm,Front perineal area,Buttocks,Right upper leg,Left upper leg,Right lower leg,Left lower leg     Bathing assist Assist Level: Total Assistance - Patient < 25%     Upper Body Dressing/Undressing Upper body dressing   What is the patient wearing?: Hospital gown only    Upper body assist Assist Level: Total Assistance - Patient < 25%  Lower Body Dressing/Undressing Lower body dressing      What is the patient wearing?: Incontinence brief     Lower body assist Assist for lower body dressing: Total  Assistance - Patient < 25%     Toileting Toileting    Toileting assist Assist for toileting: Dependent - Patient 0%     Transfers Chair/bed transfer  Transfers assist  Chair/bed transfer activity did not occur: Safety/medical concerns        Locomotion Ambulation   Ambulation assist   Ambulation activity did not occur: Safety/medical concerns          Walk 10 feet activity   Assist  Walk 10 feet activity did not occur: Safety/medical concerns        Walk 50 feet activity   Assist Walk 50 feet with 2 turns activity did not occur: Safety/medical concerns         Walk 150 feet activity   Assist Walk 150 feet activity did not occur: Safety/medical concerns         Walk 10 feet on uneven surface  activity   Assist Walk 10 feet on uneven surfaces activity did not occur: Safety/medical concerns         Wheelchair     Assist Will patient use wheelchair at discharge?: Yes Type of Wheelchair:  (TBD) Wheelchair activity did not occur: Safety/medical concerns         Wheelchair 50 feet with 2 turns activity    Assist    Wheelchair 50 feet with 2 turns activity did not occur: Safety/medical concerns       Wheelchair 150 feet activity     Assist  Wheelchair 150 feet activity did not occur: Safety/medical concerns       Blood pressure (!) 142/75, pulse 75, temperature 98.6 F (37 C), temperature source Oral, resp. rate 20, height 6\' 2"  (1.88 m), weight 94.4 kg, SpO2 93 %.    Medical Problem List and Plan: 1.  Right side weakness with dysphagia as well as dysarthria secondary to acute inferior pontine hemorrhage likely due to hypertension in the setting of Eliquis use as well significant bilateral vertebral artery stenosis             -patient may shower             -ELOS/Goals: 14-21 days CT head shows no new infarcts or bleeds , unchanged size of left pontine hemorrhage  2.  Antithrombotics: -DVT/anticoagulation: SCDs              -antiplatelet therapy: N/A 3. Pain Management: Continue Tylenol as needed 4. Mood: Provide emotional support             -antipsychotic agents: N/A 5. Neuropsych: This patient is capable of making decisions on his own behalf. 6. Skin/Wound Care: Routine skin checks 7. Fluids/Electrolytes/Nutrition: Routine INO's with follow-up chemistries, BMET normal 12/24 8.  Dysphagia. Speech on acute noted lethargy but continued Dysphagia #2 honey thick liquids.Needs to be NPO has cortrak, d/c IVF 9.  Atrial fibrillation.  Eliquis discontinued due to ICH.  Cardiac rate controlled.  Continue beta-blocker Normal Ej fx 10.  Hypertension.  Norvasc 10 mg daily. Currently elevated. Increase Lopressor to 25mg  TID. Monitor with increased mobility 11.  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  Contact precautions 12.  Prediabetes.  Hemoglobin A1c 6.1.  SSI. CBGs ranging from 98 to 133. Provide dietary education.  13.  GERD.  Continue Protonix  LOS: 3 days A FACE TO FACE EVALUATION WAS PERFORMED  [name] [name] 06/19/2020, 8:43 AM

## 2020-06-19 NOTE — Plan of Care (Signed)
  Problem: RH Memory Goal: LTG Patient will use memory compensatory aids to (SLP) Description: LTG:  Patient will use memory compensatory aids to recall biographical/new, daily complex information with cues (SLP) Outcome: Not Applicable   Problem: RH Attention Goal: LTG Patient will demonstrate this level of attention during functional activites (SLP) Description: LTG:  Patient will will demonstrate this level of attention during functional activites (SLP) Flowsheets (Taken 06/19/2020 1535) Patient will demonstrate during cognitive/linguistic activities the attention type of: Sustained Patient will demonstrate this level of attention during cognitive/linguistic activities in: Controlled LTG: Patient will demonstrate this level of attention during cognitive/linguistic activities with assistance of (SLP): Supervision Number of minutes patient will demonstrate attention during cognitive/linguistic activities: 30 minutes   Problem: RH Memory Goal: LTG Patient will demonstrate ability for day to day (SLP) Description: LTG:   Patient will demonstrate ability for day to day recall/carryover during cognitive/linguistic activities with assist  (SLP) Flowsheets (Taken 06/19/2020 1536) LTG: Patient will demonstrate ability for day to day recall:  Daily complex information  New information LTG: Patient will demonstrate ability for day to day recall/carryover during cognitive/linguistic activities with assist (SLP): Supervision

## 2020-06-20 ENCOUNTER — Inpatient Hospital Stay (HOSPITAL_COMMUNITY): Payer: Medicare Other

## 2020-06-20 ENCOUNTER — Inpatient Hospital Stay (HOSPITAL_COMMUNITY): Payer: Medicare Other | Admitting: Occupational Therapy

## 2020-06-20 LAB — GLUCOSE, CAPILLARY
Glucose-Capillary: 118 mg/dL — ABNORMAL HIGH (ref 70–99)
Glucose-Capillary: 144 mg/dL — ABNORMAL HIGH (ref 70–99)
Glucose-Capillary: 147 mg/dL — ABNORMAL HIGH (ref 70–99)
Glucose-Capillary: 159 mg/dL — ABNORMAL HIGH (ref 70–99)
Glucose-Capillary: 159 mg/dL — ABNORMAL HIGH (ref 70–99)
Glucose-Capillary: 164 mg/dL — ABNORMAL HIGH (ref 70–99)

## 2020-06-20 MED ORDER — LISINOPRIL 2.5 MG PO TABS
2.5000 mg | ORAL_TABLET | Freq: Every day | ORAL | Status: DC
  Administered 2020-06-20 – 2020-06-28 (×8): 2.5 mg via ORAL
  Filled 2020-06-20 (×9): qty 1

## 2020-06-20 MED ORDER — MAGNESIUM GLUCONATE 500 MG PO TABS
250.0000 mg | ORAL_TABLET | Freq: Every day | ORAL | Status: DC
  Administered 2020-06-20 – 2020-06-28 (×8): 250 mg via ORAL
  Filled 2020-06-20 (×12): qty 1

## 2020-06-20 NOTE — Progress Notes (Signed)
Patient ID: [name], male   DOB: [birth year], 79 y.o.   MRN: [id number]   SW spoke with patient spouse   Lavera Guise, [place]

## 2020-06-20 NOTE — Progress Notes (Signed)
Speech Language Pathology Daily Session Note  Patient Details  Name: [name] MRN: [id number] Date of Birth: May 02, 1941  Today's Date: 06/20/2020   SESSION 1: SLP Individual Time: 8502-7741 SLP Individual Time Calculation (min): 58 min   SESSION 2: SLP Individual Time: 2878-6767 SLP Individual Time Calculation (min): 27 min   Short Term Goals: Week 1: SLP Short Term Goal 1 (Week 1): Pt will participate in PO trials with improvements in ability orally manipulate bolus and reduction in overt s/sx aspriation to demosntrate readiness for repeat MBSS to determine least restrictive PO diet. SLP Short Term Goal 2 (Week 1): Pt will utilize compensatory strategies to increase speech intelligibility to ~75% at the word and phrase level with Mod A cues. SLP Short Term Goal 3 (Week 1): Pt will demonstrate recall of novel and daily information with mod A verbal cues. SLP Short Term Goal 4 (Week 1): Pt will demonstrate sustained attention and alertness in 10 minute intervals with min A verbal cues for redirection.  Skilled Therapeutic Interventions: Session 1: Skilled ST skilled focused on swallow and cognitive skills. Pt continues to demonstrate continued fatigue, benefits from frequent rest breaks. SLP raising HOB 90 degrees and provided oral care prior to 3x tsp honey thick liquid. Pt demonstrate no swallow initiation this date despite max multimodal cues (dry spoon, max verbal cues, tactile stim). Pt states "it just won't go", unable to elicit voluntary swallow to assist with clearing oral and pharyngeal residuals of HTL. After ~8 minutes, pt states "I finally swallowed!" followed by wet coughing episode. Secretions and remaining residue suctioned out of oral cavity. SLP facilitated memory task to increase recall and carryover of daily information related to hospital stay/current medical status by providing mod A verbal cues for 60% accuracy. Pt does benefit from extra time and repetition to increase  accuracy of recall of novel information. Pt was left in room with call bell within reach and bed alarm set, cont ST POC.   Session 2: Pt more somnolent than AM session, sitting up in [facility] and agreeable to participate in cognitive skills treatment with frequent rest breaks. SLP facilitating word retention task by providing mod A verbal cues, with focus on "grouping" compensatory memory strategy, to increase functional recall. Pt would benefit from ongoing education/training in compensatory memory strategies. Pt left up in wheelchair with chair belt alarm on and all needs within reach. Cont ST POC.   Pain Pain Assessment Pain Scale: 0-10 Pain Score: 0-No pain  Therapy/Group: Individual Therapy  Tacey Ruiz 06/20/2020, 10:02 AM

## 2020-06-20 NOTE — Progress Notes (Addendum)
[place] PHYSICAL MEDICINE & REHABILITATION PROGRESS NOTE   Subjective/Complaints: Patient continues to be somnolent.  Tolerated PT well today Denies pain BP elevated  ROS- Unable to provide today due to somnolence.   Objective:   No results found. No results for input(s): WBC, HGB, HCT, PLT in the last 72 hours. No results for input(s): NA, K, CL, CO2, GLUCOSE, BUN, CREATININE, CALCIUM in the last 72 hours.  Intake/Output Summary (Last 24 hours) at 06/20/2020 1242 Last data filed at 06/20/2020 0700 Gross per 24 hour  Intake 0 ml  Output --  Net 0 ml        Physical Exam: Vital Signs Blood pressure (!) 157/98, pulse 83, temperature 98.2 F (36.8 C), temperature source Oral, resp. rate 19, height 6\' 2"  (1.88 m), weight 96.1 kg, SpO2 95 %. Gen: no distress, normal appearing HEENT: oral mucosa pink and moist, NCAT Cardio: Reg rate Chest: normal effort, normal rate of breathing Abd: soft, non-distended Motor strength is 5/5 in left deltoid, bicep, tricep, grip, hip flexor, knee extensors, ankle dorsiflexor and plantar flexor 0/5 right UW, trace R hip/knee ext synergy  Sensory exam normal sensation to light touch and proprioception in bilateral upper and lower extremities Musculoskeletal: Right knee valgus deformity, No pain with Right shoulder, elbow , wrist  ROM, mod pitting edema Left elbow, ecchymosis from BP cuff R forearm  Assessment/Plan: 1. Functional deficits which require 3+ hours per day of interdisciplinary therapy in a comprehensive inpatient rehab setting.  Physiatrist is providing close team supervision and 24 hour management of active medical problems listed below.  Physiatrist and rehab team continue to assess barriers to discharge/monitor patient progress toward functional and medical goals  Care Tool:  Bathing    Body parts bathed by patient: Chest,Abdomen,Face   Body parts bathed by helper: Right arm,Left arm,Front perineal area,Buttocks,Right  upper leg,Left upper leg,Right lower leg,Left lower leg     Bathing assist Assist Level: Total Assistance - Patient < 25%     Upper Body Dressing/Undressing Upper body dressing   What is the patient wearing?: Hospital gown only    Upper body assist Assist Level: Total Assistance - Patient < 25%    Lower Body Dressing/Undressing Lower body dressing      What is the patient wearing?: Incontinence brief     Lower body assist Assist for lower body dressing: 2 Helpers     Toileting Toileting    Toileting assist Assist for toileting: Dependent - Patient 0%     Transfers Chair/bed transfer  Transfers assist  Chair/bed transfer activity did not occur: Safety/medical concerns        Locomotion Ambulation   Ambulation assist   Ambulation activity did not occur: Safety/medical concerns          Walk 10 feet activity   Assist  Walk 10 feet activity did not occur: Safety/medical concerns        Walk 50 feet activity   Assist Walk 50 feet with 2 turns activity did not occur: Safety/medical concerns         Walk 150 feet activity   Assist Walk 150 feet activity did not occur: Safety/medical concerns         Walk 10 feet on uneven surface  activity   Assist Walk 10 feet on uneven surfaces activity did not occur: Safety/medical concerns         Wheelchair     Assist Will patient use wheelchair at discharge?: Yes Type of Wheelchair: Manual (TBD)  Wheelchair activity did not occur: Safety/medical concerns         Wheelchair 50 feet with 2 turns activity    Assist    Wheelchair 50 feet with 2 turns activity did not occur: Safety/medical concerns       Wheelchair 150 feet activity     Assist  Wheelchair 150 feet activity did not occur: Safety/medical concerns       Blood pressure (!) 157/98, pulse 83, temperature 98.2 F (36.8 C), temperature source Oral, resp. rate 19, height 6\' 2"  (1.88 m), weight 96.1 kg, SpO2 95  %.    Medical Problem List and Plan: 1.  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  Antithrombotics: -DVT/anticoagulation: SCDs             -antiplatelet therapy: N/A 3. Pain Management: Continue Tylenol as needed. Therapy notes reviewed- denies pain during therapy sessions.  4. Mood: Provide emotional support             -antipsychotic agents: N/A 5. Neuropsych: This patient is capable of making decisions on his own behalf. 6. Skin/Wound Care: Routine skin checks 7. Fluids/Electrolytes/Nutrition: Routine INO's with follow-up chemistries, BMET normal 12/24 8.  Dysphagia. Speech on acute noted lethargy but continued Dysphagia #2 honey thick liquids.Needs to be NPO has cortrak, d/c IVF 9.  Atrial fibrillation.  Eliquis discontinued due to ICH.  Cardiac rate controlled.  Continue beta-blocker Normal Ej fx 10.  Hypertension.  Norvasc 10 mg daily. Currently elevated. Increase Lopressor to 25mg  TID. Start Lisinopril 2.5mg  on 12/27. Cr reviewed and normal. Monitor with increased mobility 11.  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  Contact precautions 12.  Prediabetes.  Hemoglobin A1c 6.1.  SSI. CBGs ranging from 118 to 159: monitor AC/HS. Provide dietary education.  13.  GERD.  Continue Protonix 14. Hypomagnesemia: Star magnesium gluconate 250mg  daily  LOS: 4 days A FACE TO FACE EVALUATION WAS PERFORMED  [name] 06/20/2020, 12:42 PM

## 2020-06-20 NOTE — Progress Notes (Signed)
Physical Therapy Session Note  Patient Details  Name: [name] MRN: [id number] Date of Birth: 02-Apr-1941  Today's Date: 06/20/2020 PT Individual Time: 1106-1205 PT Individual Time Calculation (min): 59 min   Short Term Goals: Week 1:  PT Short Term Goal 1 (Week 1): Pt will maintain static sitting balance with min A x 5 min PT Short Term Goal 2 (Week 1): Pt will perform least restrictive transfer OOB with assist x 2 PT Short Term Goal 3 (Week 1): Pt will initiate w/c mobility PT Short Term Goal 4 (Week 1): Pt will tolerate sitting OOB x 1 hour Skilled Therapeutic Interventions/Progress Updates: Pt presented in bed awake and agreeable to therapy. Pt denies pain during session. Pt noted to be incontinent of bladder through to sheets. Performed rolling L/R with maxA to change brief and perform peri-care total A. After new brief donned pt performed additional rolling with pt initiating reach with LUE for bedrail when rolling to R to allow PTA to don pants total A. Performed supine to sit maxA (+2 present for safety) with pt requiring modA progressing to minA to maintain midline orientation. Performed SB transfer maxA x 2 to TIS. Pt then transported to ortho gym and participated in reaching activity with LUE L/anterior/R with pt requiring assist to return to midline. With fatigue pt noted to have increased R lateral lean with difficulty correcting without assist. Pt transported to day room and participated in [place] x2 with PTA providing maxA for reciprocal activity and to push through with LLE for glute activation. Pt transported back to room at end of session with PTA then providing suction to mouth as thick secretions noted once mask removed. Pt left in TIS at end of session with belt alarm on, call bell within reach and current needs met.       Therapy Documentation Precautions:  Precautions Precautions: Fall Precaution Comments: R hemiplegia with  subluxation Restrictions Weight Bearing Restrictions: No General:   Vital Signs:  Pain: Pain Assessment Pain Scale: 0-10 Pain Score: 0-No pain Mobility:   Locomotion :    Trunk/Postural Assessment :    Balance:   Exercises:   Other Treatments:      Therapy/Group: Individual Therapy  Adelaine Roppolo 06/20/2020, 12:29 PM

## 2020-06-20 NOTE — Plan of Care (Signed)
Max assist with adls 

## 2020-06-20 NOTE — Progress Notes (Signed)
Patient ID: [name], male   DOB: 1941/02/19, 79 y.o.   MRN: [id number]  SW made attempt to call patient spouse again, no answer. Left VM  Lavera Guise, [place] [phone]

## 2020-06-20 NOTE — Progress Notes (Signed)
Occupational Therapy Session Note  Patient Details  Name: [name] MRN: [id number] Date of Birth: [birth year]  Today's Date: 06/20/2020 OT Individual Time: 1402-1430 OT Individual Time Calculation (min): 28 min   Short Term Goals: Week 1:  OT Short Term Goal 1 (Week 1): Pt will be able to sit to EOB with mod A of 1. OT Short Term Goal 2 (Week 1): Pt will be able to maintain sitting balance EOB with mod A of 1. OT Short Term Goal 3 (Week 1): Pt will bathe UB with mod A. OT Short Term Goal 4 (Week 1): Pt will don shirt with mod A. OT Short Term Goal 5 (Week 1): Pt will be able to find items in his R visual field with min cues.  Skilled Therapeutic Interventions/Progress Updates:    Pt greeted in the w/c, asleep and appearing exhausted. He nodded when asked if he wanted to return to bed. +2 for placement of maxisling where pt was then transferred via lift back to bed. +2 for doffing soiled pants, hygiene, and donning clean brief. Pt able to reach for the bedrail on his Rt side with cues but needed Max-Total facilitation for rolling in both Rt>Lt directions. Suction performed to mouth due to thick secretions. Also noticed purple spotting around periarea. Notified RN of secretions of purple spotting. Left pt in care of RN.   Therapy Documentation Precautions:  Precautions Precautions: Fall Precaution Comments: R hemiplegia with subluxation Restrictions Weight Bearing Restrictions: No Vital Signs: Therapy Vitals Pulse Rate: (!) 58 Resp: 20 BP: 118/79 Patient Position (if appropriate): Lying Oxygen Therapy SpO2: 94 % O2 Device: Room Air Pain: pt with no s/s pain during tx Pain Assessment Pain Scale: 0-10 Pain Score: 0-No pain ADL: ADL Eating: Maximal assistance Grooming: Maximal assistance Upper Body Bathing: Maximal assistance Where Assessed-Upper Body Bathing: Bed level Lower Body Bathing: Dependent Where Assessed-Lower Body Bathing: Bed level Upper Body Dressing: Maximal  assistance Where Assessed-Upper Body Dressing: Bed level Lower Body Dressing: Dependent Where Assessed-Lower Body Dressing: Bed level Toileting: Not assessed Toilet Transfer: Not assessed     Therapy/Group: Individual Therapy  Jeanenne Licea A Lismary Kiehn 06/20/2020, 4:03 PM

## 2020-06-21 ENCOUNTER — Inpatient Hospital Stay (HOSPITAL_COMMUNITY): Payer: Medicare Other | Admitting: Speech Pathology

## 2020-06-21 ENCOUNTER — Inpatient Hospital Stay (HOSPITAL_COMMUNITY): Payer: Medicare Other | Admitting: Physical Therapy

## 2020-06-21 ENCOUNTER — Inpatient Hospital Stay (HOSPITAL_COMMUNITY): Payer: Medicare Other | Admitting: Occupational Therapy

## 2020-06-21 LAB — GLUCOSE, CAPILLARY
Glucose-Capillary: 129 mg/dL — ABNORMAL HIGH (ref 70–99)
Glucose-Capillary: 138 mg/dL — ABNORMAL HIGH (ref 70–99)
Glucose-Capillary: 152 mg/dL — ABNORMAL HIGH (ref 70–99)
Glucose-Capillary: 164 mg/dL — ABNORMAL HIGH (ref 70–99)
Glucose-Capillary: 174 mg/dL — ABNORMAL HIGH (ref 70–99)
Glucose-Capillary: 180 mg/dL — ABNORMAL HIGH (ref 70–99)

## 2020-06-21 NOTE — Progress Notes (Signed)
Pt slept most of the night. No problems noted.

## 2020-06-21 NOTE — Progress Notes (Signed)
Physical Therapy Session Note  Patient Details  Name: [name] MRN: [id number] Date of Birth: 10/04/1940  Today's Date: 06/21/2020 PT Individual Time: 0240-9735 PT Individual Time Calculation (min): 70 min   Short Term Goals: Week 1:  PT Short Term Goal 1 (Week 1): Pt will maintain static sitting balance with min A x 5 min PT Short Term Goal 2 (Week 1): Pt will perform least restrictive transfer OOB with assist x 2 PT Short Term Goal 3 (Week 1): Pt will initiate w/c mobility PT Short Term Goal 4 (Week 1): Pt will tolerate sitting OOB x 1 hour  Skilled Therapeutic Interventions/Progress Updates:    pt received in bed and agreeable to therapy. Pt denied pain throughout session. Pt directed in supine BLE PNF rhythmic initiation to attempt to increased muscle activation at hip flexion, knee extension, hip adduction, hip abduction however no noted muscle activation noted, x10 each. Pt directed in rolling R max A and L mod A  For x2 with use of bed rail for donning pants, total A. Second assist present at this point in therapy, pt directed in supine>sit max A for BLE management and trunk support into sitting, mod A for initial sitting balance at EOB pt reported dizziness at EOB but with extra time and VC for breathing, pt reported it resolved quickly, max A for positioning and pt remained in trunk flexion and extreme forward head carriage with inability to correct with manual facilitation and unable to maintain. Pt setup total A for slide board transfer to TIS WC, total A x2. Pt taken to gym total A for time and energy. PT attempted to direct pt to transfer to standing in [place] however with max A x2 pt unable to clear chair with x2 attempts, no activation of R glute or knee extension noted, and poor coordination with activation and mobility of LLE as well. Pt then directed in seated NMRE PNF additional RI for 2x10 hip abduction, hip adduction, hip flexion and knee extension with muscle tapping to  increased muscle activation, and extra time to allow pt time to attempt contractions however not active contraction noted. Pt taken back to room, total A for time. Pt reported he was "extremely" tired when asked but denied pain. Pt left in TIS WC, pillows for positioning, alarm belt set, All needs in reach and in good condition. Call light in hand.  And sling on throughout entire session.   Therapy Documentation Precautions:  Precautions Precautions: Fall Precaution Comments: R hemiplegia with subluxation Restrictions Weight Bearing Restrictions: No General:   Vital Signs:   Pain: Pain Assessment Pain Scale: 0-10 Pain Score: 0-No pain Mobility:   Locomotion :    Trunk/Postural Assessment :    Balance:   Exercises:   Other Treatments:      Therapy/Group: Individual Therapy  Barbaraann Faster 06/21/2020, 11:04 AM

## 2020-06-21 NOTE — Progress Notes (Signed)
[place] PHYSICAL MEDICINE & REHABILITATION PROGRESS NOTE   Subjective/Complaints:  Voice very weak but pt is awake  ROS- denies breathing problems, has occ cough, no pains or N/V  Objective:   No results found. No results for input(s): WBC, HGB, HCT, PLT in the last 72 hours. No results for input(s): NA, K, CL, CO2, GLUCOSE, BUN, CREATININE, CALCIUM in the last 72 hours. No intake or output data in the 24 hours ending 06/21/20 0801      Physical Exam: Vital Signs Blood pressure 134/73, pulse 79, temperature 98.1 F (36.7 C), temperature source Axillary, resp. rate 16, height 6\' 2"  (1.88 m), weight 95.6 kg, SpO2 97 %.  General: No acute distress Mood and affect are appropriate Heart: Regular rate and rhythm no rubs murmurs or extra sounds Lungs: Clear to auscultation, breathing unlabored, no rales or wheezes Abdomen: Positive bowel sounds, soft nontender to palpation, nondistended Extremities: No clubbing, cyanosis, or edema Skin: No evidence of breakdown, no evidence of rash   Motor strength is 5/5 in left deltoid, bicep, tricep, grip, hip flexor, knee extensors, ankle dorsiflexor and plantar flexor 0/5 right UW, trace R hip/knee ext synergy  Sensory exam normal sensation to light touch and proprioception in bilateral upper and lower extremities Musculoskeletal: Right knee valgus deformity, No pain with Right shoulder, elbow , wrist  ROM, mod pitting edema Left elbow, ecchymosis from BP cuff R forearm  Assessment/Plan: 1. Functional deficits which require 3+ hours per day of interdisciplinary therapy in a comprehensive inpatient rehab setting.  Physiatrist is providing close team supervision and 24 hour management of active medical problems listed below.  Physiatrist and rehab team continue to assess barriers to discharge/monitor patient progress toward functional and medical goals  Care Tool:  Bathing    Body parts bathed by patient: Chest,Abdomen,Face   Body  parts bathed by helper: Right arm,Left arm,Front perineal area,Buttocks,Right upper leg,Left upper leg,Right lower leg,Left lower leg     Bathing assist Assist Level: Total Assistance - Patient < 25%     Upper Body Dressing/Undressing Upper body dressing   What is the patient wearing?: Hospital gown only    Upper body assist Assist Level: Total Assistance - Patient < 25%    Lower Body Dressing/Undressing Lower body dressing      What is the patient wearing?: Incontinence brief     Lower body assist Assist for lower body dressing: 2 Helpers     Toileting Toileting    Toileting assist Assist for toileting: Dependent - Patient 0%     Transfers Chair/bed transfer  Transfers assist  Chair/bed transfer activity did not occur: Safety/medical concerns        Locomotion Ambulation   Ambulation assist   Ambulation activity did not occur: Safety/medical concerns          Walk 10 feet activity   Assist  Walk 10 feet activity did not occur: Safety/medical concerns        Walk 50 feet activity   Assist Walk 50 feet with 2 turns activity did not occur: Safety/medical concerns         Walk 150 feet activity   Assist Walk 150 feet activity did not occur: Safety/medical concerns         Walk 10 feet on uneven surface  activity   Assist Walk 10 feet on uneven surfaces activity did not occur: Safety/medical concerns         Wheelchair     Assist Will patient use wheelchair at  discharge?: Yes Type of Wheelchair: Manual (TBD) Wheelchair activity did not occur: Safety/medical concerns         Wheelchair 50 feet with 2 turns activity    Assist    Wheelchair 50 feet with 2 turns activity did not occur: Safety/medical concerns       Wheelchair 150 feet activity     Assist  Wheelchair 150 feet activity did not occur: Safety/medical concerns       Blood pressure 134/73, pulse 79, temperature 98.1 F (36.7 C), temperature  source Axillary, resp. rate 16, height 6\' 2"  (1.88 m), weight 95.6 kg, SpO2 97 %.    Medical Problem List and Plan: 1.  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  Antithrombotics: -DVT/anticoagulation: SCDs             -antiplatelet therapy: N/A 3. Pain Management: Continue Tylenol as needed. Therapy notes reviewed- denies pain during therapy sessions.  4. Mood: Provide emotional support             -antipsychotic agents: N/A 5. Neuropsych: This patient is capable of making decisions on his own behalf. 6. Skin/Wound Care: Routine skin checks 7. Fluids/Electrolytes/Nutrition: Routine INO's with follow-up chemistries, BMET normal 12/24 8.  Dysphagia. Speech on acute noted lethargy but continued Dysphagia #2 honey thick liquids.Needs to be NPO has cortrak, d/c IVF 9.  Atrial fibrillation.  Eliquis discontinued due to ICH.  Cardiac rate controlled.  Continue beta-blocker Normal Ej fx 10.  Hypertension.  Norvasc 10 mg daily. Currently elevated. Increase Lopressor to 25mg  TID. Start Lisinopril 2.5mg  on 12/27. Cr reviewed and normal. Monitor with increased mobility 11.  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  Contact precautions 12.  Prediabetes.  Hemoglobin A1c 6.1.  SSI. CBGs ranging from 118 to 159: monitor AC/HS. Provide dietary education.  13.  GERD.  Continue Protonix 14. Hypomagnesemia: Star magnesium gluconate 250mg  daily  LOS: 5 days A FACE TO FACE EVALUATION WAS PERFORMED  06/21/2020, 8:01 AM

## 2020-06-21 NOTE — Progress Notes (Signed)
Occupational Therapy Session Note  Patient Details  Name: [name] MRN: [id number] Date of Birth: 1940/10/25  Today's Date: 06/21/2020 OT Individual Time: 4401-0272 OT Individual Time Calculation (min): 87 min    Short Term Goals: Week 1:  OT Short Term Goal 1 (Week 1): Pt will be able to sit to EOB with mod A of 1. OT Short Term Goal 2 (Week 1): Pt will be able to maintain sitting balance EOB with mod A of 1. OT Short Term Goal 3 (Week 1): Pt will bathe UB with mod A. OT Short Term Goal 4 (Week 1): Pt will don shirt with mod A. OT Short Term Goal 5 (Week 1): Pt will be able to find items in his R visual field with min cues.  Skilled Therapeutic Interventions/Progress Updates:    patient seated in TIS w/c, alert but sleepy, wife present for therapy session.  Dysarthric speech, difficult to understand but he is aware of needs and demonstrates appropriate humor.  Patient notes pain in right elbow with ROM - significant edema elbow, wrist and hand.  Assisted with repositioning t/o session in addition to NMRE activities.  Forward head in all positions - worked on upright sitting at w/c level - patient notes dizziness at times (109/66 initially, returned to reclined position, donned teds and rechecked with return to upright position - 122/71)   SB transfer w/c to bed max A / dep of 2, cues for body/head position, weight shift and appropriate technique with left side.  He tolerated sitting edge of bed with focus on posture, /head/trunk control/mobility, weight bearing, midline orientation, scapular positioning.  He maintains seated position with mod/max A.  Sit to supine max A/dep x2.  Changed wet brief - dependent for clothing management and hygiene.  Rolling right with max A,  Dependent to roll left.  He is able to wash face with min A.  Provided shampoo cap - mod A to comb hair.  He remained in bed at close of session, bed alarm set and call bell in reach - he demonstrates ability to use call  bell.    Therapy Documentation Precautions:  Precautions Precautions: Fall Precaution Comments: R hemiplegia with subluxation Restrictions Weight Bearing Restrictions: No   Therapy/Group: Individual Therapy  Barrie Lyme 06/21/2020, 7:41 AM

## 2020-06-21 NOTE — Progress Notes (Signed)
Speech Language Pathology Daily Session Note  Patient Details  Name: [name] MRN: [id number] Date of Birth: 1941/06/06  Today's Date: 06/21/2020 SLP Individual Time: 1300-1345 SLP Individual Time Calculation (min): 45 min  Short Term Goals: Week 1: SLP Short Term Goal 1 (Week 1): Pt will participate in PO trials with improvements in ability orally manipulate bolus and reduction in overt s/sx aspriation to demosntrate readiness for repeat MBSS to determine least restrictive PO diet. SLP Short Term Goal 2 (Week 1): Pt will utilize compensatory strategies to increase speech intelligibility to ~75% at the word and phrase level with Mod A cues. SLP Short Term Goal 3 (Week 1): Pt will demonstrate recall of novel and daily information with mod A verbal cues. SLP Short Term Goal 4 (Week 1): Pt will demonstrate sustained attention and alertness in 10 minute intervals with min A verbal cues for redirection.  Skilled Therapeutic Interventions: Pt was seen for skilled ST targeting dysphagia and speech goals. Pt was lethargic but able to achieve alertness for appropriate intervals of time to participate in PO trials today. Pt initially unable to initiate swallow sequence on first presentation of tsp Honey thick juice, however able to initiate in 4/5 additional attempts (although suspect delayed based on observations) - these were slightly larger boluses, full tsp. However, delayed congested but non-productive cough noted in 4/5 large tsp, as well as initial small tsp trial. Pt also with very weak lingual manipulation of bolus, with delayed oral transit and reduce labial seal. Recommend continue NPO with Cortrak for now. Pt became more fatigued throughout session, likely exacerbating severity of dysarthria. Pt produced 2-syllable words with ~50% intelligibility during a barrier speech task. Pt noted to have particular difficulty with bilabial and alveolar consonants, and some vowels that require lip rounding  (ex: "oo"). Attempted to provide articulatory placement cues for production of sounds in isolation (/t/ and /p/), however pt demonstrated difficulty following directions, suspect due to level of fatigue. He also still demonstrated very limited lingual ROM, in addition to labial and facial weakness, which make it even more difficult for him to follow articulatory placement cues. Pt missed last 15 mins of session due to fatigue. He was left in bed with alarm set and needs within reach. Continue per current plan of care.        Pain Pain Assessment Pain Scale: 0-10 Pain Score: 0-No pain  Therapy/Group: Individual Therapy  [name] 06/21/2020, 7:21 AM

## 2020-06-22 ENCOUNTER — Inpatient Hospital Stay (HOSPITAL_COMMUNITY): Payer: Medicare Other | Admitting: Speech Pathology

## 2020-06-22 ENCOUNTER — Inpatient Hospital Stay (HOSPITAL_COMMUNITY): Payer: Medicare Other | Admitting: Physical Therapy

## 2020-06-22 ENCOUNTER — Inpatient Hospital Stay (HOSPITAL_COMMUNITY): Payer: Medicare Other

## 2020-06-22 LAB — GLUCOSE, CAPILLARY
Glucose-Capillary: 129 mg/dL — ABNORMAL HIGH (ref 70–99)
Glucose-Capillary: 153 mg/dL — ABNORMAL HIGH (ref 70–99)
Glucose-Capillary: 153 mg/dL — ABNORMAL HIGH (ref 70–99)
Glucose-Capillary: 159 mg/dL — ABNORMAL HIGH (ref 70–99)
Glucose-Capillary: 175 mg/dL — ABNORMAL HIGH (ref 70–99)
Glucose-Capillary: 175 mg/dL — ABNORMAL HIGH (ref 70–99)
Glucose-Capillary: 95 mg/dL (ref 70–99)

## 2020-06-22 LAB — CBC
HCT: 46.8 % (ref 39.0–52.0)
Hemoglobin: 15.7 g/dL (ref 13.0–17.0)
MCH: 30.1 pg (ref 26.0–34.0)
MCHC: 33.5 g/dL (ref 30.0–36.0)
MCV: 89.8 fL (ref 80.0–100.0)
Platelets: 257 10*3/uL (ref 150–400)
RBC: 5.21 MIL/uL (ref 4.22–5.81)
RDW: 13.1 % (ref 11.5–15.5)
WBC: 9.3 10*3/uL (ref 4.0–10.5)
nRBC: 0 % (ref 0.0–0.2)

## 2020-06-22 NOTE — Progress Notes (Signed)
Occupational Therapy Session Note  Patient Details  Name: [name] MRN: [id number] Date of Birth: 1941-02-10  Today's Date: 06/22/2020 OT Individual Time: 3354-5625 OT Individual Time Calculation (min): 27 min    Short Term Goals: Week 1:  OT Short Term Goal 1 (Week 1): Pt will be able to sit to EOB with mod A of 1. OT Short Term Goal 2 (Week 1): Pt will be able to maintain sitting balance EOB with mod A of 1. OT Short Term Goal 3 (Week 1): Pt will bathe UB with mod A. OT Short Term Goal 4 (Week 1): Pt will don shirt with mod A. OT Short Term Goal 5 (Week 1): Pt will be able to find items in his R visual field with min cues.  Skilled Therapeutic Interventions/Progress Updates:    1:1. Pt received in bed SNORING despite max multimodal cuing, increasing stimuli (visual, auditory, and tactile). Pt even sat EOB with total +2 A and pt only briefly opens eyes, however closes eyes and continues to shower. Pt requires +2 to return to bed. Applied K tape to dorsal side of the RUE to improve edema. Exited session with pt seated in bed, exit alarm on and call light in reach. Pt audibly snoring immediately as pt lays down.   Therapy Documentation Precautions:  Precautions Precautions: Fall Precaution Comments: R hemiplegia with subluxation Restrictions Weight Bearing Restrictions: No General:   Vital Signs: Therapy Vitals Temp: 98.3 F (36.8 C) Pulse Rate: 69 Resp: 20 BP: 140/82 Patient Position (if appropriate): Lying Oxygen Therapy SpO2: 95 % O2 Device: Room Air Pain:   ADL: ADL Eating: Maximal assistance Grooming: Maximal assistance Upper Body Bathing: Maximal assistance Where Assessed-Upper Body Bathing: Bed level Lower Body Bathing: Dependent Where Assessed-Lower Body Bathing: Bed level Upper Body Dressing: Maximal assistance Where Assessed-Upper Body Dressing: Bed level Lower Body Dressing: Dependent Where Assessed-Lower Body Dressing: Bed level Toileting: Not  assessed Toilet Transfer: Not assessed Vision   Perception    Praxis   Exercises:   Other Treatments:     Therapy/Group: Individual Therapy  Shon Hale 06/22/2020, 6:53 AM

## 2020-06-22 NOTE — NC FL2 (Addendum)
[place] MEDICAID FL2 LEVEL OF CARE SCREENING TOOL     IDENTIFICATION  Patient Name: [name] Birthdate: October 02, 1940 Sex: male Admission Date (Current Location): 06/16/2020  [facility] and [place] Number:  [employer] and Address:  [facility], 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 06/16/2020   OSA (obstructive sleep apnea) 06/16/2020   Aortic atherosclerosis (HCC) 06/16/2020   Thrombocytopenia (HCC) 06/16/2020   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 06/10/2020   AAA (abdominal aortic aneurysm) without rupture (HCC) 11/27/2019   Atrial fibrillation (HCC) 08/26/2019   Spinal stenosis of lumbar region 11/02/2015   Abnormal nuclear stress test    Hyperlipidemia 12/31/2014   HYPERTENSION, BENIGN SYSTEMIC 08/22/2006   Coronary atherosclerosis 08/22/2006   Thoracic aortic aneurysm [facility]) s/p stent 2006 08/22/2006    Orientation RESPIRATION BLADDER Height & Weight     Self,Situation,Place,Time  Normal Incontinent Weight: 206 lb 2.1 oz (93.5 kg) Height:  6\' 2"  (188 cm)  BEHAVIORAL SYMPTOMS/MOOD NEUROLOGICAL BOWEL NUTRITION STATUS      Incontinent Diet (Potential for PEG)  AMBULATORY STATUS COMMUNICATION OF NEEDS Skin   Total Care Verbally Normal                       Personal Care Assistance Level of Assistance  Bathing,Feeding,Dressing,Total care Bathing Assistance: Maximum assistance Feeding assistance: Limited assistance Dressing Assistance:  Maximum assistance Total Care Assistance: Maximum assistance   Functional Limitations Info             SPECIAL CARE FACTORS FREQUENCY  PT (By licensed PT),OT (By licensed OT),Speech therapy     PT Frequency: 5x a week OT Frequency: 5x a week     Speech Therapy Frequency: 5x a week      Contractures      Additional Factors Info                  Current Medications (06/22/2020):  This is the current hospital active medication list Current Facility-Administered Medications  Medication Dose Route Frequency Provider Last Rate Last Admin   acetaminophen (TYLENOL) tablet 650 mg  650 mg Oral Q4H PRN Angiulli, 06/24/2020, PA-C       Or   acetaminophen (TYLENOL) 160 MG/5ML solution 650 mg  650 mg Per Tube Q4H PRN Angiulli, Mcarthur Rossetti, PA-C       Or   acetaminophen (TYLENOL) suppository 650 mg  650 mg Rectal Q4H PRN Angiulli, Mcarthur Rossetti, PA-C       amLODipine (NORVASC) tablet 10 mg  10 mg Per Tube Daily Kirsteins, Mcarthur Rossetti, MD   10 mg at 06/22/20 0909   chlorhexidine (PERIDEX) 0.12 % solution 15 mL  15 mL Mouth Rinse BID 06/24/20, MD   15 mL at 06/22/20 0907   feeding supplement (OSMOLITE 1.5 CAL) liquid 1,000 mL  1,000 mL Per Tube Continuous 06/24/20, MD 60 mL/hr at 06/21/20 1749 1,000 mL at 06/21/20 1749   feeding supplement (PROSource  TF) liquid 45 mL  45 mL Per Tube TID Erick Colace, MD   45 mL at 06/22/20 2010   free water 150 mL  150 mL Per Tube Q4H Erick Colace, MD   150 mL at 06/22/20 1251   insulin aspart (novoLOG) injection 0-15 Units  0-15 Units Subcutaneous Q4H AngiulliMcarthur Rossetti, PA-C   2 Units at 06/22/20 1250   lisinopril (ZESTRIL) tablet 2.5 mg  2.5 mg Oral Daily Raulkar, Drema Pry, MD   2.5 mg at 06/22/20 0908   magnesium gluconate (MAGONATE) tablet 250 mg  250 mg Oral Daily Raulkar, Drema Pry, MD   250 mg at 06/22/20 0908   MEDLINE mouth rinse  15 mL Mouth Rinse BID Erick Colace, MD   15 mL at 06/22/20 0910    metoprolol tartrate (LOPRESSOR) tablet 50 mg  50 mg Per Tube TID Erick Colace, MD   50 mg at 06/22/20 0908   pantoprazole sodium (PROTONIX) 40 mg/20 mL oral suspension 40 mg  40 mg Per Tube QHS Erick Colace, MD   40 mg at 06/21/20 2053   sennosides (SENOKOT) 8.8 MG/5ML syrup 5 mL  5 mL Oral BID Erick Colace, MD   5 mL at 06/22/20 0907     Discharge Medications: Please see discharge summary for a list of discharge medications.  Relevant Imaging Results:  Relevant Lab Results:   Additional Information    Andria Rhein

## 2020-06-22 NOTE — Progress Notes (Signed)
Physical Therapy Session Note  Patient Details  Name: [name] MRN: [id number] Date of Birth: 17-Aug-1940  Today's Date: 06/22/2020 PT Individual Time: 0910-1010 PT Individual Time Calculation (min): 60 min   Short Term Goals: Week 1:  PT Short Term Goal 1 (Week 1): Pt will maintain static sitting balance with min A x 5 min PT Short Term Goal 2 (Week 1): Pt will perform least restrictive transfer OOB with assist x 2 PT Short Term Goal 3 (Week 1): Pt will initiate w/c mobility PT Short Term Goal 4 (Week 1): Pt will tolerate sitting OOB x 1 hour  Skilled Therapeutic Interventions/Progress Updates: Pt presented in bed with nsg present agreeable to therapy. Once meds received pt cleared to remove from tube feed. Pt noted to be significantly incontinent of urine. Performed rolling to R with modA with PTA completing roll to remove brief. PTA noted that pt bleeding from rectum. PTA notified Ed (RN) and came to room to inspect and will notify MD. PTA completed peri-care with pt performing rolling to L with maxA x 2. Once peri-care completed pt performed supine to sit with maxA x 2 and set up with SB transfer to TIS. Performed SB transfer to L to TIS requiring maxA x 2. Once in TIS pt transported to ortho gym and participated in BITS visual scanning program. PTA provided facilitation for anterior reaching with moderate challenges. Pt transported back to room at end of session and remained in TIS. Pt left with belt alarm on, call bell within reach and pt resting comfortably.      Therapy Documentation Precautions:  Precautions Precautions: Fall Precaution Comments: R hemiplegia with subluxation Restrictions Weight Bearing Restrictions: No General:   Vital Signs: Therapy Vitals BP: 138/78 Pain: Pain Assessment Pain Scale: 0-10 Pain Score: 0-No pain    Therapy/Group: Individual Therapy  Davari Lopes  Terree Gaultney, PTA  06/22/2020, 12:52 PM

## 2020-06-22 NOTE — Progress Notes (Signed)
[facility] PHYSICAL MEDICINE & REHABILITATION PROGRESS NOTE   Subjective/Complaints:  Still lethargic with wet voice occ cough, no SOB  ROS- denies breathing problems, has occ cough, no pains or N/V  Objective:   No results found. No results for input(s): WBC, HGB, HCT, PLT in the last 72 hours. No results for input(s): NA, K, CL, CO2, GLUCOSE, BUN, CREATININE, CALCIUM in the last 72 hours.  Intake/Output Summary (Last 24 hours) at 06/22/2020 0751 Last data filed at 06/21/2020 1808 Gross per 24 hour  Intake 0 ml  Output --  Net 0 ml        Physical Exam: Vital Signs Blood pressure 140/82, pulse 69, temperature 98.3 F (36.8 C), resp. rate 20, height 6' 2" (1.88 m), weight 93.5 kg, SpO2 95 %.  General: No acute distress Mood and affect are appropriate Heart: Regular rate and rhythm no rubs murmurs or extra sounds Lungs: Clear to auscultation, breathing unlabored, no rales or wheezes Abdomen: Positive bowel sounds, soft nontender to palpation, nondistended Extremities: No clubbing, cyanosis, or edema Skin: No evidence of breakdown, no evidence of rash  Oriented to place and time  Motor strength is 5/5 in left deltoid, bicep, tricep, grip, hip flexor, knee extensors, ankle dorsiflexor and plantar flexor 0/5 right UW, trace R hip/knee ext synergy  Sensory exam normal sensation to light touch and proprioception in bilateral upper and lower extremities Musculoskeletal: Right knee valgus deformity, No pain with Right shoulder, elbow , wrist  ROM, mod pitting edema Left elbow, ecchymosis from BP cuff R forearm  Assessment/Plan: 1. Functional deficits which require 3+ hours per day of interdisciplinary therapy in a comprehensive inpatient rehab setting.  Physiatrist is providing close team supervision and 24 hour management of active medical problems listed below.  Physiatrist and rehab team continue to assess barriers to discharge/monitor patient progress toward functional  and medical goals  Care Tool:  Bathing    Body parts bathed by patient: Chest,Abdomen,Face   Body parts bathed by helper: Right arm,Left arm,Front perineal area,Buttocks,Right upper leg,Left upper leg,Right lower leg,Left lower leg     Bathing assist Assist Level: Total Assistance - Patient < 25%     Upper Body Dressing/Undressing Upper body dressing   What is the patient wearing?: Hospital gown only    Upper body assist Assist Level: Total Assistance - Patient < 25%    Lower Body Dressing/Undressing Lower body dressing      What is the patient wearing?: Incontinence brief     Lower body assist Assist for lower body dressing: 2 Helpers     Toileting Toileting    Toileting assist Assist for toileting: Dependent - Patient 0%     Transfers Chair/bed transfer  Transfers assist  Chair/bed transfer activity did not occur: Safety/medical concerns        Locomotion Ambulation   Ambulation assist   Ambulation activity did not occur: Safety/medical concerns          Walk 10 feet activity   Assist  Walk 10 feet activity did not occur: Safety/medical concerns        Walk 50 feet activity   Assist Walk 50 feet with 2 turns activity did not occur: Safety/medical concerns         Walk 150 feet activity   Assist Walk 150 feet activity did not occur: Safety/medical concerns         Walk 10 feet on uneven surface  activity   Assist Walk 10 feet on uneven surfaces activity   did not occur: Safety/medical concerns         Wheelchair     Assist Will patient use wheelchair at discharge?: Yes Type of Wheelchair: Manual (TBD) Wheelchair activity did not occur: Safety/medical concerns         Wheelchair 50 feet with 2 turns activity    Assist    Wheelchair 50 feet with 2 turns activity did not occur: Safety/medical concerns       Wheelchair 150 feet activity     Assist  Wheelchair 150 feet activity did not occur:  Safety/medical concerns       Blood pressure 140/82, pulse 69, temperature 98.3 F (36.8 C), resp. rate 20, height 6' 2" (1.88 m), weight 93.5 kg, SpO2 95 %.    Medical Problem List and Plan: 1.  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 Formulized individual treatment plan based on medical history, underlying problem and comorbidities.  2.  Antithrombotics: -DVT/anticoagulation: SCDs             -antiplatelet therapy: N/A 3. Pain Management: Continue Tylenol as needed. Therapy notes reviewed- denies pain during therapy sessions.  4. Mood: Provide emotional support             -antipsychotic agents: N/A 5. Neuropsych: This patient is capable of making decisions on his own behalf. 6. Skin/Wound Care: Routine skin checks 7. Fluids/Electrolytes/Nutrition: Routine INO's with follow-up chemistries, BMET normal 12/24 8.  Dysphagia. Speech on acute noted lethargy but continued Dysphagia #2 honey thick liquids.Needs to be NPO has cortrak, d/c IVF may need PEG 9.  Atrial fibrillation.  Eliquis discontinued due to ICH.  Cardiac rate controlled.  Continue beta-blocker Normal Ej fx 10.  Hypertension.  Norvasc 10 mg daily. Currently elevated. Increase Lopressor to 25mg TID. Start Lisinopril 2.5mg on 12/27. Cr reviewed and normal. Monitor with increased mobility Vitals:   06/21/20 2014 06/22/20 0355  BP: 124/81 140/82  Pulse: 72 69  Resp: 20 20  Temp: 98.1 F (36.7 C) 98.3 F (36.8 C)  SpO2: 95% 95%  controlled  12/29 11.  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  Contact precautions 12.  Prediabetes.  Hemoglobin A1c 6.1.  SSI. CBGs ranging from 118 to 159: monitor AC/HS. Provide dietary education.  13.  GERD.  Continue Protonix 14. Hypomagnesemia: Star magnesium gluconate 250mg daily  LOS: 6 days A FACE TO FACE EVALUATION WAS PERFORMED  Andrew E Kirsteins 06/22/2020, 7:51 AM    

## 2020-06-22 NOTE — Progress Notes (Signed)
Patient ID: [name], male   DOB: Jul 18, 1940, 79 y.o.   MRN: [id number]   SNF options provided to spouse. Spouse informed sw she cannot afford home care or can physically assist with patient in home. SW will follow up with SNF options.   [place], [place] [phone]

## 2020-06-22 NOTE — Patient Care Conference (Signed)
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 Claudette Laws Care Coodinator Present: Chana Bode, RN, BSN, CRRN;Christina McBride, BSW Nurse Present: Keturah Barre, RN PT Present: Grier Rocher, PT OT Present: Blanch Media, OT SLP Present: Suzzette Righter, CF-SLP PPS Coordinator present : Edson Snowball, [place] Breed, SLP     Current Status/Progress Goal Weekly Team Focus  Bowel/Bladder   Pt incontinent of B/B LBM 06/21/2020  Regular BM every 3 days or less  Assess B/B every shift and PRN   Swallow/Nutrition/ Hydration   NPO, Cortrak, PO trials have been very limited due to his inability to trigger swallow sequence  Supervision  Continue PO trials, MBS to see if acute change in swallow function?   ADL's   max A UB adl, dependent LB adl, max A +2 transfers, mod A sitting edge of bed  mod A  edema management/positioning right UE, sitting balance, right NMRE, adl training   Mobility             Communication   Mod-Severe dysarthria, Mod A  Supervision  speech intelligibility compensatory strategies for functional communication word and phrase level   Safety/Cognition/ Behavioral Observations  Min A  Supervision  Memory strategies, attention to functional tasks   Pain   FACES scale 0  FACES scale 0/10  Assess pain every shift and PRN   Skin   Skin  intact  no new breakdown  Assess skin every shift and PRN     Discharge Planning:  Discharging home with spouse and children 24/7, spouse unable to physcially assit. 1 level home, 2 steps to enter   Team Discussion: Patient is lethargic with poor airway protection due to deficits with oral phase of swallowing and team questions need for PEG for nutritional means for discharge. BP controlled with current medications. Patient on target to meet rehab goals: Currently max assist for upper body ADLs and total assist for lower body ADLs with min-mod assist goals set for discharge  *See Care Plan and progress notes for long and short-term goals.   Revisions to Treatment Plan:  Question need for PEG placement for nutritional needs Working on pacing strategies  For communication and swallowing with mild STMD noted Neuromuscular retraining exercises  Teaching Needs: Nutritional means, medications, transfers, toileting, etc  Current Barriers to Discharge: Decreased caregiver support, Home enviroment access/layout and Nutritional means 2 step entry to home Possible Resolutions to Barriers:  SNF placement discussion with wife who cannot provide physical care at discharge      Medical Summary Current Status: Severe dysphagia, lethargy, poor airway protection, incontinence  Barriers to Discharge: Incontinence;Nutrition means   Possible Resolutions to [employer] Focus: Would not aggressively push p.o. at this point given lethargy and poor airway protection, may need PEG prior to discharge   Continued Need for Acute Rehabilitation Level of Care: The patient requires daily  medical management by a physician with specialized training in physical medicine and rehabilitation for the following reasons: Direction of a multidisciplinary physical rehabilitation program to maximize functional independence : Yes Medical management of patient stability for increased activity during participation in an  intensive rehabilitation regime.: Yes Analysis of laboratory values and/or radiology reports with any subsequent need for medication adjustment and/or medical intervention. : Yes   I attest that I was present, lead the team conference, and concur with the assessment and plan of the team.   Chana Bode B 06/22/2020, 2:05 PM

## 2020-06-22 NOTE — Progress Notes (Signed)
Patient ID: [name], male   DOB: November 28, 1940, 79 y.o.   MRN: [id number] Team Conference Report to Patient/Family  Team Conference discussion was reviewed with the patient and caregiver, including goals, any changes in plan of care and target discharge date.  Patient and caregiver express understanding and are in agreement.  The patient has a target discharge date of 07/12/20 (?SNF pending).  Andria Rhein 06/22/2020, 2:02 PM

## 2020-06-22 NOTE — Progress Notes (Signed)
Speech Language Pathology Weekly Progress Note  Patient Details  Name: [name] MRN: [id number] Date of Birth: 03-Oct-1940  Beginning of progress report period: June 17, 2020 End of progress report period: June 23, 2020    Short Term Goals: Week 1: SLP Short Term Goal 1 (Week 1): Pt will participate in PO trials with improvements in ability orally manipulate bolus and reduction in overt s/sx aspriation to demosntrate readiness for repeat MBSS to determine least restrictive PO diet. SLP Short Term Goal 1 - Progress (Week 1): Progressing toward goal SLP Short Term Goal 2 (Week 1): Pt will utilize compensatory strategies to increase speech intelligibility to ~75% at the word and phrase level with Mod A cues. SLP Short Term Goal 2 - Progress (Week 1): Progressing toward goal SLP Short Term Goal 3 (Week 1): Pt will demonstrate recall of novel and daily information with mod A verbal cues. SLP Short Term Goal 3 - Progress (Week 1): Progressing toward goal SLP Short Term Goal 4 (Week 1): Pt will demonstrate sustained attention and alertness in 10 minute intervals with min A verbal cues for redirection. SLP Short Term Goal 4 - Progress (Week 1): Met    New Short Term Goals: Week 2: SLP Short Term Goal 1 (Week 2): Pt will participate in PO trials with improvements in ability orally manipulate bolus and reduction in overt s/sx aspriation to demosntrate readiness for repeat MBSS to determine least restrictive PO diet. SLP Short Term Goal 2 (Week 2): Pt will utilize compensatory strategies to increase speech intelligibility to ~75% at the word and phrase level with Mod A cues. SLP Short Term Goal 3 (Week 2): Pt will demonstrate recall of novel and daily information with mod A verbal cues. SLP Short Term Goal 4 (Week 2): Pt will demonstrate sustained attention and alertness in 20 minute intervals with min A verbal cues for redirection.  Weekly Progress Updates: Pt has made very slow gains and  met 1 out of 4 short term goals this reporting period. Pt is currently Mod-Max assist for basic familiar tasks due to severe dysarthria and dysphagia, as well as mild cognitive deficits marked by decreased short term memory and attention/arousal. He has been limited by fatigue and severity of communication and swallowing deficits this week. Pt is currently NPO with Cortrak and continues to demonstrate difficulty orally manipulating and transiting boluses with overt s/sx aspiration in ~50% of conservative PO trials with SLP. He requires Mod-Max A for use of pacing/pausing between words and overarticulation strategies to increase speech intelligibility, but is still 50-60% intelligible at word/phrase level. Pt and family education is ongoing. Pt would continue to benefit from skilled ST while inpatient in order to maximize functional independence and reduce burden of care prior to discharge. Anticipate that pt will need 24/7 supervision at discharge in addition to [place] follow up at next level of care - anticipate SNF needed for safest d/c.     Intensity: Minumum of 1-2 x/day, 30 to 90 minutes Frequency: 3 to 5 out of 7 days Duration/Length of Stay: 07/13/19 Treatment/Interventions: Dysphagia/aspiration precaution training;Patient/family education;Therapeutic Activities;Cueing hierarchy;Functional tasks;Speech/Language facilitation;Cognitive remediation/compensation        Arbutus Leas 06/23/2020, 7:26 AM

## 2020-06-22 NOTE — Progress Notes (Signed)
Speech Language Pathology Daily Session Note  Patient Details  Name: [name] MRN: [id number] Date of Birth: 08-25-1940  Today's Date: 06/22/2020 SLP Individual Time: 0731-0800 SLP Individual Time Calculation (min): 29 min  Short Term Goals: Week 1: SLP Short Term Goal 1 (Week 1): Pt will participate in PO trials with improvements in ability orally manipulate bolus and reduction in overt s/sx aspriation to demosntrate readiness for repeat MBSS to determine least restrictive PO diet. SLP Short Term Goal 2 (Week 1): Pt will utilize compensatory strategies to increase speech intelligibility to ~75% at the word and phrase level with Mod A cues. SLP Short Term Goal 3 (Week 1): Pt will demonstrate recall of novel and daily information with mod A verbal cues. SLP Short Term Goal 4 (Week 1): Pt will demonstrate sustained attention and alertness in 10 minute intervals with min A verbal cues for redirection.  Skilled Therapeutic Interventions: Pt was seen for skilled ST targeting speech and swallow goals. SLp facilitated session with Max faded to Mod A demonstration cues for pacing/pausing in between words as compensatory speech intelligibility strategy during a picture description task. Pt was ~50% intelligible. After oral care via suction toothbrush, pt accepted conservative trials of honey thick water via tsp with extended time. Swallow initiation observed in 3/4 trials and 1 delayed congestive cough. Recommend continue NPO and ST will continue to assess readiness for advancement/repeat MBS. Pt left laying in bed with alarm set and needs within reach. Continue per current plan of care.      Pain Pain Assessment Pain Scale: 0-10 Pain Score: 0-No pain  Therapy/Group: Individual Therapy  [name] 06/22/2020, 7:29 AM

## 2020-06-23 ENCOUNTER — Inpatient Hospital Stay (HOSPITAL_COMMUNITY): Payer: Medicare Other

## 2020-06-23 ENCOUNTER — Inpatient Hospital Stay (HOSPITAL_COMMUNITY): Payer: Medicare Other | Admitting: Physical Therapy

## 2020-06-23 ENCOUNTER — Inpatient Hospital Stay (HOSPITAL_COMMUNITY): Payer: Medicare Other | Admitting: Speech Pathology

## 2020-06-23 LAB — GLUCOSE, CAPILLARY
Glucose-Capillary: 178 mg/dL — ABNORMAL HIGH (ref 70–99)
Glucose-Capillary: 132 mg/dL — ABNORMAL HIGH (ref 70–99)
Glucose-Capillary: 154 mg/dL — ABNORMAL HIGH (ref 70–99)
Glucose-Capillary: 116 mg/dL — ABNORMAL HIGH (ref 70–99)
Glucose-Capillary: 180 mg/dL — ABNORMAL HIGH (ref 70–99)

## 2020-06-23 MED ORDER — OSMOLITE 1.5 CAL PO LIQD
1000.0000 mL | ORAL | Status: DC
  Administered 2020-06-23 – 2020-06-30 (×9): 1000 mL
  Filled 2020-06-23 (×5): qty 1000

## 2020-06-23 NOTE — Progress Notes (Signed)
Physical Therapy Session Note  Patient Details  Name: [name] MRN: [id number] Date of Birth: Aug 07, 1940  Today's Date: 06/23/2020 PT Individual Time:1300-1410   70 min   Short Term Goals: Week 1:  PT Short Term Goal 1 (Week 1): Pt will maintain static sitting balance with min A x 5 min PT Short Term Goal 2 (Week 1): Pt will perform least restrictive transfer OOB with assist x 2 PT Short Term Goal 3 (Week 1): Pt will initiate w/c mobility PT Short Term Goal 4 (Week 1): Pt will tolerate sitting OOB x 1 hour  Skilled Therapeutic Interventions/Progress Updates:   Pt received sitting in WC and agreeable to PT. Pt transported to rehab gym. UE NMR to perform shoulder retraction/protraction 2 x 15 with AAROM in gravity eliminated position. Hip flexion/extension 2 x 12 BLE with AAROM on the RLE. Trace flexion and extension noted intermittently on the R. Forward and cross body reaches the the LUE to engage core musculature 2 x 5 in each direction and only min assist to prevent forward LOB.   Kinetron hip/knee flexion extension with max assist on the RLE with trace activation again noted on the R intermittently. Pt reporting extreme fatigue following therapies throughout the day and requesting to return to bed.  Maxi move transfer to bed with +2 assist. Rolling R and L x 2 with mod assist to the R and max assist to the L to remove sling and check pt for incontinence. No incontinence noted. Pt left in bed with call bell in reach and all needs met.       Therapy Documentation Precautions:  Precautions Precautions: Fall Precaution Comments: R hemiplegia with subluxation Restrictions Weight Bearing Restrictions: No Vital Signs: Therapy Vitals Pulse Rate: 76 Resp: (!) 23 BP: 117/64 Patient Position (if appropriate): Lying Oxygen Therapy SpO2: 97 % O2 Device: Room Air Pain: denies  Therapy/Group: Individual Therapy  Lorie Phenix 06/23/2020, 7:58 AM

## 2020-06-23 NOTE — Progress Notes (Signed)
Patient's wife called and stated that  she wants to make the patient a DNR. Writer notified D. Angiulli, PA.

## 2020-06-23 NOTE — Progress Notes (Signed)
[facility] PHYSICAL MEDICINE & REHABILITATION PROGRESS NOTE   Subjective/Complaints:  Discussed swallowing issues and probable need for PEG with pt and wife yesterday  No issues overnite remains lethargic but responds briefly to voice   ROS- denies breathing problems, has occ cough, no pains or N/V  Objective:   No results found. Recent Labs    06/22/20 1052  WBC 9.3  HGB 15.7  HCT 46.8  PLT 257   No results for input(s): NA, K, CL, CO2, GLUCOSE, BUN, CREATININE, CALCIUM in the last 72 hours.  Intake/Output Summary (Last 24 hours) at 06/23/2020 0759 Last data filed at 06/22/2020 1818 Gross per 24 hour  Intake 0 ml  Output --  Net 0 ml        Physical Exam: Vital Signs Blood pressure 117/64, pulse 76, temperature 98.3 F (36.8 C), resp. rate (!) 23, height 6\' 2"  (1.88 m), weight 91.5 kg, SpO2 97 %.  General: No acute distress Mood and affect are appropriate Heart: Regular rate and rhythm no rubs murmurs or extra sounds Lungs: Clear to auscultation, breathing unlabored, no rales or wheezes Abdomen: Positive bowel sounds, soft nontender to palpation, nondistended Extremities: No clubbing, cyanosis, or edema Skin: No evidence of breakdown, no evidence of rash  Oriented to place and time  Motor strength is 5/5 in left deltoid, bicep, tricep, grip, hip flexor, knee extensors, ankle dorsiflexor and plantar flexor 0/5 right UW, trace R hip/knee ext synergy  Sensory exam normal sensation to light touch and proprioception in bilateral upper and lower extremities Musculoskeletal: Right knee valgus deformity, No pain with Right shoulder, elbow , wrist  ROM, mod pitting edema Left elbow, ecchymosis from BP cuff R forearm  Assessment/Plan: 1. Functional deficits which require 3+ hours per day of interdisciplinary therapy in a comprehensive inpatient rehab setting.  Physiatrist is providing close team supervision and 24 hour management of active medical problems listed  below.  Physiatrist and rehab team continue to assess barriers to discharge/monitor patient progress toward functional and medical goals  Care Tool:  Bathing    Body parts bathed by patient: Chest,Abdomen,Face   Body parts bathed by helper: Right arm,Left arm,Front perineal area,Buttocks,Right upper leg,Left upper leg,Right lower leg,Left lower leg     Bathing assist Assist Level: Total Assistance - Patient < 25%     Upper Body Dressing/Undressing Upper body dressing   What is the patient wearing?: Hospital gown only    Upper body assist Assist Level: Total Assistance - Patient < 25%    Lower Body Dressing/Undressing Lower body dressing      What is the patient wearing?: Incontinence brief     Lower body assist Assist for lower body dressing: 2 Helpers     Toileting Toileting    Toileting assist Assist for toileting: Dependent - Patient 0%     Transfers Chair/bed transfer  Transfers assist  Chair/bed transfer activity did not occur: Safety/medical concerns        Locomotion Ambulation   Ambulation assist   Ambulation activity did not occur: Safety/medical concerns          Walk 10 feet activity   Assist  Walk 10 feet activity did not occur: Safety/medical concerns        Walk 50 feet activity   Assist Walk 50 feet with 2 turns activity did not occur: Safety/medical concerns         Walk 150 feet activity   Assist Walk 150 feet activity did not occur: Safety/medical concerns  Walk 10 feet on uneven surface  activity   Assist Walk 10 feet on uneven surfaces activity did not occur: Safety/medical concerns         Wheelchair     Assist Will patient use wheelchair at discharge?: Yes Type of Wheelchair: Manual (TBD) Wheelchair activity did not occur: Safety/medical concerns         Wheelchair 50 feet with 2 turns activity    Assist    Wheelchair 50 feet with 2 turns activity did not occur:  Safety/medical concerns       Wheelchair 150 feet activity     Assist  Wheelchair 150 feet activity did not occur: Safety/medical concerns       Blood pressure 117/64, pulse 76, temperature 98.3 F (36.8 C), resp. rate (!) 23, height 6\' 2"  (1.88 m), weight 91.5 kg, SpO2 97 %.    Medical Problem List and Plan: 1.  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  Antithrombotics: -DVT/anticoagulation: SCDs             -antiplatelet therapy: N/A 3. Pain Management: Continue Tylenol as needed. Therapy notes reviewed- denies pain during therapy sessions.  4. Mood: Provide emotional support             -antipsychotic agents: N/A 5. Neuropsych: This patient is capable of making decisions on his own behalf. 6. Skin/Wound Care: Routine skin checks 7. Fluids/Electrolytes/Nutrition: Routine INO's with follow-up chemistries, BMET normal 12/24 8.  Dysphagia. Speech on acute noted lethargy but continued Dysphagia #2 honey thick liquids.Needs to be NPO has cortrak, d/c IVF may need PEG 9.  Atrial fibrillation.  Eliquis discontinued due to ICH.  Cardiac rate controlled.  Continue beta-blocker Normal Ej fx 10.  Hypertension.  Norvasc 10 mg daily. Currently elevated. Increase Lopressor to 25mg  TID. Start Lisinopril 2.5mg  on 12/27. Cr reviewed and normal. Monitor with increased mobility Vitals:   06/22/20 2150 06/23/20 0421  BP: (!) 141/75 117/64  Pulse: 89 76  Resp:  (!) 23  Temp:    SpO2:  97%  controlled 12/30 11.  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  Contact precautions 12.  Prediabetes.  Hemoglobin A1c 6.1.  SSI. CBGs ranging from 118 to 159: monitor  AC/HS. Provide dietary education.  13.  GERD.  Continue Protonix 14. Hypomagnesemia: Star magnesium gluconate 250mg  daily  LOS: 7 days A FACE TO FACE EVALUATION WAS PERFORMED  06/25/20 06/23/2020, 7:59 AM

## 2020-06-23 NOTE — Progress Notes (Signed)
Occupational Therapy Session Note  Patient Details  Name: [name] MRN: [id number] Date of Birth: Jan 22, 1941  Today's Date: 06/23/2020 OT Individual Time: 1520-1600 OT Individual Time Calculation (min): 40 min    Short Term Goals: Week 1:  OT Short Term Goal 1 (Week 1): Pt will be able to sit to EOB with mod A of 1. OT Short Term Goal 2 (Week 1): Pt will be able to maintain sitting balance EOB with mod A of 1. OT Short Term Goal 3 (Week 1): Pt will bathe UB with mod A. OT Short Term Goal 4 (Week 1): Pt will don shirt with mod A. OT Short Term Goal 5 (Week 1): Pt will be able to find items in his R visual field with min cues.  Skilled Therapeutic Interventions/Progress Updates:    1:1. Pt received in TIS awake and agreeable to OT. Pt completes slide board transfer TIS>EOB with MAX A of 2 with VC for handplacement to assist with transfer. Pt with poor trunk control and management of R limbs d/t flaccid hemiplegia. Pt completes rolling with MIN-MAX A overall for changing brief/peri care. ? Trace activation in RUE when rolling to L. Pt with mild incontinence in brief of urine. Pt completes UB ROM activities attempting to activate RUE with 1 instance of horizontal abduction and internal rotation. ? Reflexive in nature. Attempted rolling ball across bed, push/pull OT, bend/straighten elbow but unable to replicate activation. Exited session with pt seated in bed, exit alarm on and call light in reach   Therapy Documentation Precautions:  Precautions Precautions: Fall Precaution Comments: R hemiplegia with subluxation Restrictions Weight Bearing Restrictions: No General:   Vital Signs: Therapy Vitals Pulse Rate: 78 BP: 120/62 Pain:   ADL: ADL Eating: Maximal assistance Grooming: Maximal assistance Upper Body Bathing: Maximal assistance Where Assessed-Upper Body Bathing: Bed level Lower Body Bathing: Dependent Where Assessed-Lower Body Bathing: Bed level Upper Body Dressing:  Maximal assistance Where Assessed-Upper Body Dressing: Bed level Lower Body Dressing: Dependent Where Assessed-Lower Body Dressing: Bed level Toileting: Not assessed Toilet Transfer: Not assessed Vision   Perception    Praxis   Exercises:   Other Treatments:     Therapy/Group: Individual Therapy  Shon Hale 06/23/2020, 12:28 PM

## 2020-06-23 NOTE — Progress Notes (Signed)
Physical Therapy Session Note  Patient Details  Name: [name] MRN: [id number] Date of Birth: 12-31-1940  Today's Date: 06/23/2020 PT Individual Time: 1010-1105 PT Individual Time Calculation (min): 55 min   Short Term Goals: Week 1:  PT Short Term Goal 1 (Week 1): Pt will maintain static sitting balance with min A x 5 min PT Short Term Goal 2 (Week 1): Pt will perform least restrictive transfer OOB with assist x 2 PT Short Term Goal 3 (Week 1): Pt will initiate w/c mobility PT Short Term Goal 4 (Week 1): Pt will tolerate sitting OOB x 1 hour  Skilled Therapeutic Interventions/Progress Updates:   Pt received supine in bed and agreeable to PT. NT present and noted pt to have been incontinent of bladder. Rolling R and L with max assist to the R and total A to the L to doff soiled brief and then don clean brief and pants.  Supine>sit transfer with total A+2 for safety.  Sitting balance EOB with min assist to correct posterior LOB, able to initiated correction following cues from PT. SB transfer to [facility] with total A +2. Pt able to initiate lateral scoot on this day.  Sitting balance/ coordination task BITS tracing 3 x 2 min with min-mod assist from PT to prevent lateral LOB to the R. Prolonged rest break between bouts in reclined position  Pt reports feeling like saliva in mouth. PT performed oral suctioning, but pt reports no change in need "full" mouth feeling. RN notified. Patient returned to room and left sitting in [facility] with call bell in reach and all needs met.        Therapy Documentation Precautions:  Precautions Precautions: Fall Precaution Comments: R hemiplegia with subluxation Restrictions Weight Bearing Restrictions: No Vital Signs: Therapy Vitals Pulse Rate: 78 BP: 120/62 Pain: denies   Therapy/Group: Individual Therapy  Lorie Phenix 06/23/2020, 11:06 AM

## 2020-06-23 NOTE — Progress Notes (Signed)
Speech Language Pathology Daily Session Note  Patient Details  Name: [name] MRN: [id number] Date of Birth: 30-Aug-1940  Today's Date: 06/23/2020 SLP Individual Time: 1300-1400 SLP Individual Time Calculation (min): 60 min  Short Term Goals: Week 2: SLP Short Term Goal 1 (Week 2): Pt will participate in PO trials with improvements in ability orally manipulate bolus and reduction in overt s/sx aspriation to demosntrate readiness for repeat MBSS to determine least restrictive PO diet. SLP Short Term Goal 2 (Week 2): Pt will utilize compensatory strategies to increase speech intelligibility to ~75% at the word and phrase level with Mod A cues. SLP Short Term Goal 3 (Week 2): Pt will demonstrate recall of novel and daily information with mod A verbal cues. SLP Short Term Goal 4 (Week 2): Pt will demonstrate sustained attention and alertness in 20 minute intervals with min A verbal cues for redirection.  Skilled Therapeutic Interventions:   Patient seen for skilled ST session focusing on speech and swallow function goals. Patient was able to recall strategy of slowing down, but required SLP modeling, verbal cues to return-demonstrate effectively. Patient able to improve overall speech intelligibility with over-emphasizing/exaggerating speech sounds and phoneme transitions, slowing speech rate, segmenting syllables or at least pause between words when at phrase level. He was able to produce lingual phonemes /d/, /t/ with modA cues. Patient then participated in oral care followed by teaspoon sips of honey thick liquids. He performed some of his own oral care with toothette sponge after SLP initiated and provided setup assistance. He exhibited oral delays in transit of honey thick liquids anterior to posterior as well as suspected delay in swallow initiation. Patient did not exhibit any immediate coughing or throat clearing but did exhibit delayed coughing to the point of expectorating what appeared to  secretions mixed with some of the juice he had just had. Patient continues to benefit from skilled SLP intervention to maximize speech, swallow and cognitive function prior to discharge.  Pain Pain Assessment Pain Scale: 0-10 Pain Score: 0-No pain  Therapy/Group: Individual Therapy  Angela Nevin, MA, CCC-SLP Speech Therapy

## 2020-06-23 NOTE — Progress Notes (Signed)
Nutrition Follow-up  DOCUMENTATION CODES:   Not applicable  INTERVENTION:   Continue tube feeds via Cortrak: - Increase Osmolite 1.5 to 70 ml/hr (tube feeding can be held for up to 4 hours for therapies) - ProSource TF 45 ml TID - Free water flushes of 150 ml q 4 hours  Tube feeding regimen provides 2220 kcal, 121 grams of protein, and 1067 ml of H2O.  Total free water with flushes: 1967 ml  NUTRITION DIAGNOSIS:   Inadequate oral intake related to inability to eat,dysphagia as evidenced by NPO status.  Ongoing  GOAL:   Patient will meet greater than or equal to 90% of their needs  Met via TF  MONITOR:   Diet advancement,Labs,Weight trends,TF tolerance,I & O's  REASON FOR ASSESSMENT:   Consult Enteral/tube feeding initiation and management  ASSESSMENT:   [name] is a 79 year old right-handed male with history of atrial fibrillation maintained on Eliquis, AAA status post stent graft 2006, CAD status post stenting, hypertension hyperlipidemia and OSA.  12/24 - Cortrak placed, tip gastric  Per review of notes, pt will likely required PEG tube placement prior to d/c to SNF. Target d/c date is 07/12/20.  Weight down a total of 5 lbs since admission to [facility]. Will increase TF regimen slightly and continue to monitor weights.  Spoke with pt briefly at bedside. Pt communicated somewhat with RD. Pt denied having any abdominal pain. TF infusing as ordered.  Current TF: Osmolite 1.5 @ 60 ml/hr, ProSource TF 45 ml TID, free water flushes 150 ml q 4 hours  Medications reviewed and include: SSI q 4 hours, magnesium gluconate, protonix, senokot  Labs reviewed. CBG's: 95-178 x 24 hours  Diet Order:   Diet Order            Diet NPO time specified  Diet effective now                 EDUCATION NEEDS:   Education needs have been addressed  Skin:  Skin Assessment: Reviewed RN Assessment  Last BM:  06/22/20 type 6  Height:   Ht Readings from Last 1 Encounters:   06/16/20 _0  (1.88 m)    Weight:   Wt Readings from Last 1 Encounters:  06/23/20 91.5 kg    Ideal Body Weight:  86.4 kg  BMI:  Body mass index is 25.9 kg/m.  Estimated Nutritional Needs:   Kcal:  2150-2350  Protein:  115-130 grams  Fluid:  > 2 L    Gustavus Bryant, MS, RD, LDN Inpatient Clinical Dietitian Please see [facility] for contact information.

## 2020-06-24 ENCOUNTER — Inpatient Hospital Stay (HOSPITAL_COMMUNITY): Payer: Medicare Other | Admitting: Physical Therapy

## 2020-06-24 ENCOUNTER — Inpatient Hospital Stay (HOSPITAL_COMMUNITY): Payer: Medicare Other | Admitting: Speech Pathology

## 2020-06-24 ENCOUNTER — Inpatient Hospital Stay (HOSPITAL_COMMUNITY): Payer: Medicare Other | Admitting: Occupational Therapy

## 2020-06-24 LAB — GLUCOSE, CAPILLARY
Glucose-Capillary: 107 mg/dL — ABNORMAL HIGH (ref 70–99)
Glucose-Capillary: 133 mg/dL — ABNORMAL HIGH (ref 70–99)
Glucose-Capillary: 136 mg/dL — ABNORMAL HIGH (ref 70–99)
Glucose-Capillary: 156 mg/dL — ABNORMAL HIGH (ref 70–99)
Glucose-Capillary: 177 mg/dL — ABNORMAL HIGH (ref 70–99)
Glucose-Capillary: 187 mg/dL — ABNORMAL HIGH (ref 70–99)

## 2020-06-24 NOTE — Progress Notes (Signed)
Physical Therapy Weekly Progress Note  Patient Details  Name: [name] MRN: [id number] Date of Birth: [birth year]  Beginning of progress report period: June 17, 2020 End of progress report period: June 24, 2020  Today's Date: 06/24/2020 PT Individual Time: 0900-0955 PT Individual Time Calculation (min): 55 min   Patient has met 3 of 4 short term goals.  Pt has made very slow progress towards Long term goals of Mod assist over the past week. He is tolerating sitting in WC up to 2 hours at a time, has perform SB transfers to and from [facility] with +2 assist, and can maintain sitting balance with min assist for shourt bouts.    Patient continues to demonstrate the following deficits muscle weakness, muscle joint tightness and muscle paralysis, decreased cardiorespiratoy endurance, impaired timing and sequencing, abnormal tone, unbalanced muscle activation and decreased coordination, decreased visual acuity, decreased visual perceptual skills and field cut, decreased attention to right and decreased sitting balance, decreased standing balance, decreased postural control, hemiplegia and decreased balance strategies and therefore will continue to benefit from skilled PT intervention to increase functional independence with mobility.  Patient progressing toward long term goals..  Continue plan of care.  PT Short Term Goals Week 1:  PT Short Term Goal 1 (Week 1): Pt will maintain static sitting balance with min A x 5 min PT Short Term Goal 1 - Progress (Week 1): Met PT Short Term Goal 2 (Week 1): Pt will perform least restrictive transfer OOB with assist x 2 PT Short Term Goal 2 - Progress (Week 1): Met PT Short Term Goal 3 (Week 1): Pt will initiate w/c mobility PT Short Term Goal 3 - Progress (Week 1): Not met PT Short Term Goal 4 (Week 1): Pt will tolerate sitting OOB x 1 hour PT Short Term Goal 4 - Progress (Week 1): Met Week 2:  PT Short Term Goal 1 (Week 2): Pt will perform sit<>stand  with max assist of 1. PT Short Term Goal 2 (Week 2): Pt will transfer to [facility] in [place] with +2 assis to force WB through RLE PT Short Term Goal 3 (Week 2): Pt will maintain sitting balacne EOB with supervision assist while engaged in dynamic reaching task  Skilled Therapeutic Interventions/Progress Updates:   Pt received supine in bed and agreeable to PT. Maxi move transfer to [facility] with +2 assist for rolling to place sling. Pt transported to rehab gym in [facility] and transferred to mat table. Sitting balance EOB with BUE support x 10 minteus with min-mod assist intermittently. Lateral reach to the L x 8 with partial cross body reach to place pipes in therapists hand. Cues for improved cervical extension/ thoracic extension and visual scanning to the R. Forced WB through the R hand/ elbow throughout to improve proprioceptive feedback. Shoulder protraction/retraction x 10 BUE with cues for improved attention to task and visual feedbakc from mirror. Trace shoulder protraction x 2 noted. Pt returned to room and performed maxi move transfer to bed with + 2 assist supine rolling completed with mod-max assist for RLE control, and left supine in bed with call bell in reach and all needs met.       Therapy Documentation Precautions:  Precautions Precautions: Fall Precaution Comments: R hemiplegia with subluxation Restrictions Weight Bearing Restrictions: No Vital Signs: Therapy Vitals Temp: 98.1 F (36.7 C) Temp Source: Oral Pulse Rate: 76 Resp: 20 BP: 118/73 Patient Position (if appropriate): Lying Oxygen Therapy SpO2: 93 % O2 Device: Room Air Pain: Pain Assessment Pain  Scale: 0-10 Pain Score: 0-No pain  Therapy/Group: Individual Therapy  Lorie Phenix 06/24/2020, 5:52 PM

## 2020-06-24 NOTE — Progress Notes (Signed)
Occupational Therapy Weekly Progress Note  Patient Details  Name: [name] MRN: [id number] Date of Birth: 1940-06-26  Beginning of progress report period: 06/17/2020 End of progress report period: 06/24/2020  Patient has met 1 of 5 short term goals.   Pt has made slow progress at time of report. His sitting balance has improved this week with pt able to engage in UB self care with no more than Mod A for balance. He continues to require Max-Total A of 1 or 2 for self care tasks due to dense Rt hemiplegia, decreased alertness, and trunk control deficits. He will continue to benefit from skilled OT for remediation of functional skills to allow for safe d/c home with wife.   Patient continues to demonstrate the following deficits: muscle weakness and muscle paralysis, decreased cardiorespiratoy endurance, abnormal tone, unbalanced muscle activation and decreased coordination, decreased midline orientation, decreased attention to right and decreased motor planning, decreased initiation, decreased awareness and decreased problem solving and decreased sitting balance, decreased postural control and hemiplegia and therefore will continue to benefit from skilled OT intervention to enhance overall performance with BADL.  Patient progressing toward long term goals..  Continue plan of care.  OT Short Term Goals Week 1:  OT Short Term Goal 1 (Week 1): Pt will be able to sit to EOB with mod A of 1. OT Short Term Goal 1 - Progress (Week 1): Progressing toward goal OT Short Term Goal 2 (Week 1): Pt will be able to maintain sitting balance EOB with mod A of 1. OT Short Term Goal 2 - Progress (Week 1): Met OT Short Term Goal 3 (Week 1): Pt will bathe UB with mod A. OT Short Term Goal 3 - Progress (Week 1): Progressing toward goal OT Short Term Goal 4 (Week 1): Pt will don shirt with mod A. OT Short Term Goal 4 - Progress (Week 1): Progressing toward goal OT Short Term Goal 5 (Week 1): Pt will be able to  find items in his R visual field with min cues. OT Short Term Goal 5 - Progress (Week 1): Progressing toward goal Week 2:  OT Short Term Goal 1 (Week 2): Pt will don shirt with Mod A OT Short Term Goal 2 (Week 2): Pt will complete toilet transfer with LRAD and +2 assist to work on OOB toileting OT Short Term Goal 3 (Week 2): Pt will bathe UB with Mod A      Therapy Documentation Precautions:  Precautions Precautions: Fall Precaution Comments: R hemiplegia with subluxation Restrictions Weight Bearing Restrictions: No Vital Signs: Therapy Vitals Temp: 98.1 F (36.7 C) Temp Source: Oral Pulse Rate: 83 Resp: 20 BP: 117/78 Patient Position (if appropriate): Lying Oxygen Therapy SpO2: 96 % O2 Device: Room Air ADL: ADL Eating: Maximal assistance Grooming: Maximal assistance Upper Body Bathing: Maximal assistance Where Assessed-Upper Body Bathing: Bed level Lower Body Bathing: Dependent Where Assessed-Lower Body Bathing: Bed level Upper Body Dressing: Maximal assistance Where Assessed-Upper Body Dressing: Bed level Lower Body Dressing: Dependent Where Assessed-Lower Body Dressing: Bed level Toileting: Not assessed Toilet Transfer: Not assessed     Therapy/Group: Individual Therapy  Dovey Fatzinger A Rakin Lemelle 06/24/2020, 7:11 AM

## 2020-06-24 NOTE — Progress Notes (Signed)
Occupational Therapy Session Note  Patient Details  Name: Papa Piercefield MRN: [id number] Date of Birth: Jun 10, 1941  Today's Date: 06/24/2020 OT Individual Time: 1100-1155 OT Individual Time Calculation (min): 55 min    Short Term Goals: Week 1:  OT Short Term Goal 1 (Week 1): Pt will be able to sit to EOB with mod A of 1. OT Short Term Goal 1 - Progress (Week 1): Progressing toward goal OT Short Term Goal 2 (Week 1): Pt will be able to maintain sitting balance EOB with mod A of 1. OT Short Term Goal 2 - Progress (Week 1): Met OT Short Term Goal 3 (Week 1): Pt will bathe UB with mod A. OT Short Term Goal 3 - Progress (Week 1): Progressing toward goal OT Short Term Goal 4 (Week 1): Pt will don shirt with mod A. OT Short Term Goal 4 - Progress (Week 1): Progressing toward goal OT Short Term Goal 5 (Week 1): Pt will be able to find items in his R visual field with min cues. OT Short Term Goal 5 - Progress (Week 1): Progressing toward goal  Skilled Therapeutic Interventions/Progress Updates:    Pt greeted in bed with no c/o pain. Able to tell therapist that he had been incontinent of urine in brief. Mod A for rolling Rt>Lt in order for OT to manage clothing, complete perihygiene, and don clean brief with Total A of 1. He was agreeable to engage in bathing/dressing tasks, starting with LB. Pt elevating his Lt LE against gravity with cuing and able to tolerate figure 4 stretch for the Rt LE to stretch the affected hip. Hemi dressing strategies utilized when donning clean pants, Total A for donning gripper socks. Max A of 1 for supine<sit, pt pushing through the Lt elbow to assist. While participating in UB bathing, hair washing with shampoo cap, and UB dressing EOB, pt required Min-Mod balance assistance with vcs. Spouse present, combed his hair after it was washed and provided support/encouragement to pt. +2 for returning to supine with pt visibly fatigued. +2 for boosting up in bed with HOB elevated  to 30 degrees, hemiplegic side protected, and bed alarm set. Tx focus placed on ADL retraining, activity tolerance, and sitting balance.    Therapy Documentation Precautions:  Precautions Precautions: Fall Precaution Comments: R hemiplegia with subluxation Restrictions Weight Bearing Restrictions: No Vital Signs: Therapy Vitals Pulse Rate: 76 Resp: 20 BP: 118/73 Patient Position (if appropriate): Lying Oxygen Therapy SpO2: 93 % O2 Device: Room Air ADL: ADL Eating: Maximal assistance Grooming: Maximal assistance Upper Body Bathing: Maximal assistance Where Assessed-Upper Body Bathing: Bed level Lower Body Bathing: Dependent Where Assessed-Lower Body Bathing: Bed level Upper Body Dressing: Maximal assistance Where Assessed-Upper Body Dressing: Bed level Lower Body Dressing: Dependent Where Assessed-Lower Body Dressing: Bed level Toileting: Not assessed Toilet Transfer: Not assessed      Therapy/Group: Individual Therapy  Juwuan Sedita A Lizeth Bencosme 06/24/2020, 3:17 PM

## 2020-06-24 NOTE — Progress Notes (Signed)
[place] PHYSICAL MEDICINE & REHABILITATION PROGRESS NOTE   Subjective/Complaints:   Awake and alert with intermittent cough, (cough is stronger)  Severe dysarthria   ROS- denies breathing problems, has occ cough, no pains or N/V  Objective:   No results found. Recent Labs    06/22/20 1052  WBC 9.3  HGB 15.7  HCT 46.8  PLT 257   No results for input(s): NA, K, CL, CO2, GLUCOSE, BUN, CREATININE, CALCIUM in the last 72 hours. No intake or output data in the 24 hours ending 06/24/20 8299      Physical Exam: Vital Signs Blood pressure 117/78, pulse 83, temperature 98.1 F (36.7 C), temperature source Oral, resp. rate 20, height 6\' 2"  (1.88 m), weight 91.9 kg, SpO2 96 %.   General: No acute distress Mood and affect are appropriate Heart: Regular rate and rhythm no rubs murmurs or extra sounds Lungs: Clear to auscultation, breathing unlabored, no rales or wheezes Abdomen: Positive bowel sounds, soft nontender to palpation, nondistended Extremities: No clubbing, cyanosis, or edema Skin: No evidence of breakdown, no evidence of rash   Oriented to place and time  Motor strength is 5/5 in left deltoid, bicep, tricep, grip, hip flexor, knee extensors, ankle dorsiflexor and plantar flexor 0/5 right UW, trace R hip/knee ext synergy  Sensory exam normal sensation to light touch and proprioception in bilateral upper and lower extremities Musculoskeletal: Right knee valgus deformity, No pain with Right shoulder, elbow , wrist  ROM, mod pitting edema Left elbow, ecchymosis from BP cuff R forearm  Assessment/Plan: 1. Functional deficits which require 3+ hours per day of interdisciplinary therapy in a comprehensive inpatient rehab setting.  Physiatrist is providing close team supervision and 24 hour management of active medical problems listed below.  Physiatrist and rehab team continue to assess barriers to discharge/monitor patient progress toward functional and medical  goals  Care Tool:  Bathing    Body parts bathed by patient: Chest,Abdomen,Face   Body parts bathed by helper: Right arm,Left arm,Front perineal area,Buttocks,Right upper leg,Left upper leg,Right lower leg,Left lower leg     Bathing assist Assist Level: Total Assistance - Patient < 25%     Upper Body Dressing/Undressing Upper body dressing   What is the patient wearing?: Hospital gown only    Upper body assist Assist Level: Total Assistance - Patient < 25%    Lower Body Dressing/Undressing Lower body dressing      What is the patient wearing?: Incontinence brief     Lower body assist Assist for lower body dressing: 2 Helpers     Toileting Toileting    Toileting assist Assist for toileting: Dependent - Patient 0%     Transfers Chair/bed transfer  Transfers assist  Chair/bed transfer activity did not occur: Safety/medical concerns        Locomotion Ambulation   Ambulation assist   Ambulation activity did not occur: Safety/medical concerns          Walk 10 feet activity   Assist  Walk 10 feet activity did not occur: Safety/medical concerns        Walk 50 feet activity   Assist Walk 50 feet with 2 turns activity did not occur: Safety/medical concerns         Walk 150 feet activity   Assist Walk 150 feet activity did not occur: Safety/medical concerns         Walk 10 feet on uneven surface  activity   Assist Walk 10 feet on uneven surfaces activity did not  occur: Safety/medical concerns         Wheelchair     Assist Will patient use wheelchair at discharge?: Yes Type of Wheelchair: Manual (TBD) Wheelchair activity did not occur: Safety/medical concerns         Wheelchair 50 feet with 2 turns activity    Assist    Wheelchair 50 feet with 2 turns activity did not occur: Safety/medical concerns       Wheelchair 150 feet activity     Assist  Wheelchair 150 feet activity did not occur: Safety/medical  concerns       Blood pressure 117/78, pulse 83, temperature 98.1 F (36.7 C), temperature source Oral, resp. rate 20, height 6\' 2"  (1.88 m), weight 91.9 kg, SpO2 96 %.    Medical Problem List and Plan: 1.  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  Antithrombotics: -DVT/anticoagulation: SCDs             -antiplatelet therapy: N/A 3. Pain Management: Continue Tylenol as needed. Therapy notes reviewed- denies pain during therapy sessions.  4. Mood: Provide emotional support             -antipsychotic agents: N/A 5. Neuropsych: This patient is capable of making decisions on his own behalf. 6. Skin/Wound Care: Routine skin checks 7. Fluids/Electrolytes/Nutrition: Routine INO's with follow-up chemistries, BMET normal 12/24 8.  Dysphagia. Speech on acute noted lethargy but continued Dysphagia #2 honey thick liquids.Needs to be NPO has cortrak, d/c IVF anticipate need for  PEG , will determine next weel 9.  Atrial fibrillation.  Eliquis discontinued due to ICH.  Cardiac rate controlled.  Continue beta-blocker Normal Ej fx 10.  Hypertension.  Norvasc 10 mg daily. Currently elevated. Increase Lopressor to 25mg  TID. Start Lisinopril 2.5mg  on 12/27. Cr reviewed and normal. Monitor with increased mobility Vitals:   06/23/20 2015 06/24/20 0458  BP: (!) 103/58 117/78  Pulse: 81 83  Resp: 16 20  Temp: 98.2 F (36.8 C) 98.1 F (36.7 C)  SpO2: 96% 96%  controlled 12/31 11.  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  Contact precautions 12.  Prediabetes.  Hemoglobin A1c 6.1.  SSI. CBGs ranging from 118 to 159: monitor AC/HS. Provide dietary education.  13.  GERD.   Continue Protonix 14. Hypomagnesemia: Star magnesium gluconate 250mg  daily  LOS: 8 days A FACE TO FACE EVALUATION WAS PERFORMED  06/26/20 06/24/2020, 8:32 AM

## 2020-06-24 NOTE — Progress Notes (Signed)
Speech Language Pathology Daily Session Note  Patient Details  Name: [name] MRN: [id number] Date of Birth: 10-13-1940  Today's Date: 06/24/2020 SLP Individual Time: 1330-1400 SLP Individual Time Calculation (min): 30 min  Short Term Goals: Week 2: SLP Short Term Goal 1 (Week 2): Pt will participate in PO trials with improvements in ability orally manipulate bolus and reduction in overt s/sx aspriation to demosntrate readiness for repeat MBSS to determine least restrictive PO diet. SLP Short Term Goal 2 (Week 2): Pt will utilize compensatory strategies to increase speech intelligibility to ~75% at the word and phrase level with Mod A cues. SLP Short Term Goal 3 (Week 2): Pt will demonstrate recall of novel and daily information with mod A verbal cues. SLP Short Term Goal 4 (Week 2): Pt will demonstrate sustained attention and alertness in 20 minute intervals with min A verbal cues for redirection.  Skilled Therapeutic Interventions:   Patient seen for skilled ST session with wife present during first few minutes. SLP performed oral care, removing thick secretions that had adhered to patient's hard and soft palate. Patient then tolerated trials of ice chips (4 total) and did demonstrate attempts to manipulate ice chip in mouth with tongue. No coughing or throat clearing noted during ice chip trials. Patient was able to produced /d/ in initial position of words after SLP cued him to exaggerate articulatory placement and manner. Patient demonstrated improved accuracy and consistency with use of speech strategies in conversation and at phrase level. He was very emotionally labile during semi-structured conversation about old TV shows and games he liked. Patient continues to benefit from skilled SLP intervention to maximize speech and swallow function prior to discharge.  Pain Pain Assessment Pain Scale: 0-10 Pain Score: 0-No pain  Therapy/Group: Individual Therapy  Angela Nevin, MA,  CCC-SLP Speech Therapy

## 2020-06-25 ENCOUNTER — Inpatient Hospital Stay (HOSPITAL_COMMUNITY): Payer: Medicare Other | Admitting: Physical Therapy

## 2020-06-25 DIAGNOSIS — I610 Nontraumatic intracerebral hemorrhage in hemisphere, subcortical: Secondary | ICD-10-CM

## 2020-06-25 DIAGNOSIS — G8191 Hemiplegia, unspecified affecting right dominant side: Secondary | ICD-10-CM

## 2020-06-25 DIAGNOSIS — I69391 Dysphagia following cerebral infarction: Secondary | ICD-10-CM

## 2020-06-25 DIAGNOSIS — R7303 Prediabetes: Secondary | ICD-10-CM

## 2020-06-25 DIAGNOSIS — I10 Essential (primary) hypertension: Secondary | ICD-10-CM

## 2020-06-25 LAB — GLUCOSE, CAPILLARY
Glucose-Capillary: 143 mg/dL — ABNORMAL HIGH (ref 70–99)
Glucose-Capillary: 145 mg/dL — ABNORMAL HIGH (ref 70–99)
Glucose-Capillary: 152 mg/dL — ABNORMAL HIGH (ref 70–99)
Glucose-Capillary: 153 mg/dL — ABNORMAL HIGH (ref 70–99)
Glucose-Capillary: 159 mg/dL — ABNORMAL HIGH (ref 70–99)
Glucose-Capillary: 164 mg/dL — ABNORMAL HIGH (ref 70–99)

## 2020-06-25 MED ORDER — CARBAMIDE PEROXIDE 6.5 % OT SOLN
5.0000 [drp] | Freq: Three times a day (TID) | OTIC | Status: DC | PRN
  Administered 2020-06-25: 5 [drp] via OTIC
  Filled 2020-06-25: qty 15

## 2020-06-25 NOTE — Progress Notes (Signed)
Physical Therapy Session Note  Patient Details  Name: [name] MRN: [id number] Date of Birth: January 08, 1941  Today's Date: 06/25/2020 PT Individual Time: 2542-7062 PT Individual Time Calculation (min): 39 min   Short Term Goals: Week 2:  PT Short Term Goal 1 (Week 2): Pt will perform sit<>stand with max assist of 1. PT Short Term Goal 2 (Week 2): Pt will transfer to [facility] in [place] with +2 assis to force WB through RLE PT Short Term Goal 3 (Week 2): Pt will maintain sitting balacne EOB with supervision assist while engaged in dynamic reaching task  Skilled Therapeutic Interventions/Progress Updates:   Pt received supine in bed and agreeable to PT following hygiene performed by RN and NT prior to session. Supine>sit transfer with max- total assist  Through log roll L and heavy use of bed features to push into sitting with the LUE. Sitting balance with min-mod assist with LUE supported on bed for PT to position SARA stedy. Sit<>stand in SARA x 4 bouts. Pt able to tolerate standing up to 1.5 minutes each bout with BUE supported on forearm supports to force WB through RUE and RLE; cues for scapular retraction and improved thoracic and cervical extension in standing. Prolonged rest breaks between bouts of standing. No s/s of orthostatic Hypotension. Pt transferred to improved position closer to [facility] on final bout in standing. Sitting balance with CGA to remove SARA. Sit>supine with total A+2 for safety to manage trunk and BLE. Pt left supine in bed with call bell in reach and all needs met.        Therapy Documentation Precautions:  Precautions Precautions: Fall Precaution Comments: R hemiplegia with subluxation Restrictions Weight Bearing Restrictions: No Pain: denies   Therapy/Group: Individual Therapy  Lorie Phenix 06/25/2020, 5:22 PM

## 2020-06-25 NOTE — Progress Notes (Signed)
Orthopedic Tech Progress Note Patient Details:  [name] 1940/09/17 [id number] Ordered brace Patient ID: [name], male   DOB: [birth year], 80 y.o.   MRN: [id number]   [name] 06/25/2020, 12:29 PM

## 2020-06-25 NOTE — Progress Notes (Signed)
[facility] PHYSICAL MEDICINE & REHABILITATION PROGRESS NOTE   Subjective/Complaints: Patient seen sitting up in bed this morning.  He states he slept well overnight.  No reported issues overnight.  ROS Denies CP, SOB, N/V/D  Objective:   No results found. No results for input(s): WBC, HGB, HCT, PLT in the last 72 hours. No results for input(s): NA, K, CL, CO2, GLUCOSE, BUN, CREATININE, CALCIUM in the last 72 hours.  Intake/Output Summary (Last 24 hours) at 06/25/2020 1101 Last data filed at 06/25/2020 0607 Gross per 24 hour  Intake 1228.17 ml  Output -  Net 1228.17 ml        Physical Exam: Vital Signs Blood pressure 136/73, pulse 78, temperature 98.2 F (36.8 C), temperature source Oral, resp. rate 18, height 6\' 2"  (1.88 m), weight 94.4 kg, SpO2 93 %. Constitutional: No distress . Vital signs reviewed. HENT: Normocephalic.  Atraumatic.  + NG. Eyes: EOMI. No discharge. Cardiovascular: No JVD.  Irregularly irregular. Respiratory: Normal effort.  No stridor.  Bilateral clear to auscultation. GI: Non-distended.  BS +. Skin: Warm and dry.  Intact. Psych: Normal mood.  Normal behavior. Musc: No edema in extremities.  No tenderness in extremities. Neuro: Alert Severe dysarthria Motor: RUE/RLE: 0/5 proximal distal  Assessment/Plan: 1. Functional deficits which require 3+ hours per day of interdisciplinary therapy in a comprehensive inpatient rehab setting.  Physiatrist is providing close team supervision and 24 hour management of active medical problems listed below.  Physiatrist and rehab team continue to assess barriers to discharge/monitor patient progress toward functional and medical goals  Care Tool:  Bathing    Body parts bathed by patient: Chest,Abdomen,Face   Body parts bathed by helper: Right arm,Left arm,Front perineal area,Buttocks,Right upper leg,Left upper leg,Right lower leg,Left lower leg     Bathing assist Assist Level: Total Assistance - Patient < 25%      Upper Body Dressing/Undressing Upper body dressing   What is the patient wearing?: Pull over shirt    Upper body assist Assist Level: Maximal Assistance - Patient 25 - 49%    Lower Body Dressing/Undressing Lower body dressing      What is the patient wearing?: Incontinence brief,Pants     Lower body assist Assist for lower body dressing: Total Assistance - Patient < 25%     Toileting Toileting    Toileting assist Assist for toileting: Total Assistance - Patient < 25%     Transfers Chair/bed transfer  Transfers assist  Chair/bed transfer activity did not occur: Safety/medical concerns        Locomotion Ambulation   Ambulation assist   Ambulation activity did not occur: Safety/medical concerns          Walk 10 feet activity   Assist  Walk 10 feet activity did not occur: Safety/medical concerns        Walk 50 feet activity   Assist Walk 50 feet with 2 turns activity did not occur: Safety/medical concerns         Walk 150 feet activity   Assist Walk 150 feet activity did not occur: Safety/medical concerns         Walk 10 feet on uneven surface  activity   Assist Walk 10 feet on uneven surfaces activity did not occur: Safety/medical concerns         Wheelchair     Assist Will patient use wheelchair at discharge?: Yes Type of Wheelchair: Manual (TBD) Wheelchair activity did not occur: Safety/medical concerns  Wheelchair 50 feet with 2 turns activity    Assist    Wheelchair 50 feet with 2 turns activity did not occur: Safety/medical concerns       Wheelchair 150 feet activity     Assist  Wheelchair 150 feet activity did not occur: Safety/medical concerns       Blood pressure 136/73, pulse 78, temperature 98.2 F (36.8 C), temperature source Oral, resp. rate 18, height 6\' 2"  (1.88 m), weight 94.4 kg, SpO2 93 %.    Medical Problem List and Plan: 1.  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  Antithrombotics: -DVT/anticoagulation: SCDs             -antiplatelet therapy: N/A 3. Pain Management: Continue Tylenol as needed.  4. Mood: Provide emotional support             -antipsychotic agents: N/A 5. Neuropsych: This patient is capable of making decisions on his own behalf. 6. Skin/Wound Care: Routine skin checks 7. Fluids/Electrolytes/Nutrition: Routine I/O's   BMP within acceptable range on 12/24, labs ordered for Monday 8.    Post stroke dysphagia.   NPO has cortrak, d/c IVF anticipate need for  PEG , will determine next week 9.  Atrial fibrillation.  Eliquis discontinued due to ICH.  Cardiac rate controlled.  Continue beta-blocker  Rate controlled on 1/1 10.  Hypertension.  Norvasc 10 mg daily. Increased Lopressor to 25mg  TID.   Lisinopril 2.5mg  on 12/27.   Monitor with increased mobility Vitals:   06/24/20 2128 06/25/20 0353  BP:  136/73  Pulse: 64 78  Resp:  18  Temp:  98.2 F (36.8 C)  SpO2:  93%   Controlled on 1/1 11.  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  Contact precautions 12.  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  Hemoglobin A1c 6.1.  SSI. CBGs ranging from 118 to 159: monitor AC/HS. Provide dietary education.   Remains elevated on 1/1, will plan to adjust meds as necessary plan once definitive plan for PEG 13.  GERD.  Continue Protonix 14. Hypomagnesemia: Started magnesium gluconate 250mg  daily  Magnesium 1.6 on 12/25, labs ordered for Monday  LOS: 9 days A FACE TO FACE EVALUATION WAS PERFORMED  Melessia Kaus 06/25/2020, 11:01 AM

## 2020-06-26 LAB — GLUCOSE, CAPILLARY
Glucose-Capillary: 107 mg/dL — ABNORMAL HIGH (ref 70–99)
Glucose-Capillary: 141 mg/dL — ABNORMAL HIGH (ref 70–99)
Glucose-Capillary: 151 mg/dL — ABNORMAL HIGH (ref 70–99)
Glucose-Capillary: 152 mg/dL — ABNORMAL HIGH (ref 70–99)
Glucose-Capillary: 162 mg/dL — ABNORMAL HIGH (ref 70–99)
Glucose-Capillary: 94 mg/dL (ref 70–99)

## 2020-06-26 MED ORDER — SODIUM CHLORIDE 0.45 % IV SOLN: INTRAVENOUS | Status: DC

## 2020-06-26 NOTE — Progress Notes (Signed)
Orthopedic Tech Progress Note Patient Details:  Sahan Pen Aug 28, 1940 [id number] Called in order to [facility] Patient ID: [name], male   DOB: 10-14-1940, 80 y.o.   MRN: [id number]   Lovett Calender 06/26/2020, 6:42 PM

## 2020-06-26 NOTE — Progress Notes (Signed)
Cortrak noted to be clogged at 1230. Attempted to de-clog using several method. Unsuccessful. Dr. Allena Katz notified. New order received. Current cortrak removed. Cortrack team not in today given the weekend. Consult entered for them to address at their earliest convenience. Pt currently on IV fluids and remains NPO.   Marylu Lund, RN

## 2020-06-26 NOTE — Progress Notes (Signed)
[facility] PHYSICAL MEDICINE & REHABILITATION PROGRESS NOTE   Subjective/Complaints: Patient seen sitting up in bed this morning.  He states he slept well overnight.  Yesterday evening, wife complained of patient having ear pain and did not want Tylenol.  Today states/indicates generalized over his head and improved with Tylenol.  Later informed by nursing regarding clogged tube feed.  ROS: Denies CP, SOB, N/V/D  Objective:   No results found. No results for input(s): WBC, HGB, HCT, PLT in the last 72 hours. No results for input(s): NA, K, CL, CO2, GLUCOSE, BUN, CREATININE, CALCIUM in the last 72 hours.  Intake/Output Summary (Last 24 hours) at 06/26/2020 1810 Last data filed at 06/26/2020 1519 Gross per 24 hour  Intake 2693.78 ml  Output -  Net 2693.78 ml        Physical Exam: Vital Signs Blood pressure (!) 130/91, pulse 67, temperature 97.6 F (36.4 C), resp. rate 19, height 6\' 2"  (1.88 m), weight 94.3 kg, SpO2 95 %.  Constitutional: No distress . Vital signs reviewed. HENT: Normocephalic.  Atraumatic.  + NG. Eyes: EOMI. No discharge. Cardiovascular: No JVD.  Irregularly irregular. Respiratory: Normal effort.  No stridor.  Bilateral clear to auscultation. GI: Non-distended.  BS +. Skin: Warm and dry.  Intact. Psych: Normal mood.  Normal behavior. Musc: Right hand edema.  No tenderness in extremities. Neuro: Alert Severe dysarthria, unchanged Motor: RUE/RLE: 0/5 proximal distal, unchanged.  Assessment/Plan: 1. Functional deficits which require 3+ hours per day of interdisciplinary therapy in a comprehensive inpatient rehab setting.  Physiatrist is providing close team supervision and 24 hour management of active medical problems listed below.  Physiatrist and rehab team continue to assess barriers to discharge/monitor patient progress toward functional and medical goals  Care Tool:  Bathing    Body parts bathed by patient: Chest,Abdomen,Face   Body parts bathed by  helper: Right arm,Left arm,Front perineal area,Buttocks,Right upper leg,Left upper leg,Right lower leg,Left lower leg     Bathing assist Assist Level: Total Assistance - Patient < 25%     Upper Body Dressing/Undressing Upper body dressing   What is the patient wearing?: Pull over shirt    Upper body assist Assist Level: Maximal Assistance - Patient 25 - 49%    Lower Body Dressing/Undressing Lower body dressing      What is the patient wearing?: Incontinence brief,Pants     Lower body assist Assist for lower body dressing: Total Assistance - Patient < 25%     Toileting Toileting    Toileting assist Assist for toileting: Total Assistance - Patient < 25%     Transfers Chair/bed transfer  Transfers assist  Chair/bed transfer activity did not occur: Safety/medical concerns        Locomotion Ambulation   Ambulation assist   Ambulation activity did not occur: Safety/medical concerns          Walk 10 feet activity   Assist  Walk 10 feet activity did not occur: Safety/medical concerns        Walk 50 feet activity   Assist Walk 50 feet with 2 turns activity did not occur: Safety/medical concerns         Walk 150 feet activity   Assist Walk 150 feet activity did not occur: Safety/medical concerns         Walk 10 feet on uneven surface  activity   Assist Walk 10 feet on uneven surfaces activity did not occur: Safety/medical concerns         Wheelchair  Assist Will patient use wheelchair at discharge?: Yes Type of Wheelchair: Manual (TBD) Wheelchair activity did not occur: Safety/medical concerns         Wheelchair 50 feet with 2 turns activity    Assist    Wheelchair 50 feet with 2 turns activity did not occur: Safety/medical concerns       Wheelchair 150 feet activity     Assist  Wheelchair 150 feet activity did not occur: Safety/medical concerns       Blood pressure (!) 130/91, pulse 67, temperature  97.6 F (36.4 C), resp. rate 19, height 6\' 2"  (1.88 m), weight 94.3 kg, SpO2 95 %.    Medical Problem List and Plan: 1.  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  Antithrombotics: -DVT/anticoagulation: SCDs             -antiplatelet therapy: N/A 3. Pain Management: Continue Tylenol as needed.   Controlled with meds 1/2 4. Mood: Provide emotional support             -antipsychotic agents: N/A 5. Neuropsych: This patient is capable of making decisions on his own behalf. 6. Skin/Wound Care: Routine skin checks 7. Fluids/Electrolytes/Nutrition: Routine I/O's   BMP within acceptable range on 12/24, labs ordered for tomorrow 8.    Post stroke dysphagia.   NPO, core track clogged, removed.  Cortex T not available and patient will likely require PEG as it is. IVF initiated.   9.  Atrial fibrillation.  Eliquis discontinued due to ICH.  Cardiac rate controlled.  Continue beta-blocker  Rate controlled on 1/2 10.  Hypertension.  Norvasc 10 mg daily. Increased Lopressor to 25mg  TID.   Lisinopril 2.5mg  on 12/27.   Monitor with increased mobility Vitals:   06/26/20 0827 06/26/20 1404  BP: 130/83 (!) 130/91  Pulse: 70 67  Resp: 20 19  Temp: 98.7 F (37.1 C) 97.6 F (36.4 C)  SpO2: 95% 95%   Controlled on 1/2 11.  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  Contact precautions 12.  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  Hemoglobin A1c 6.1.  SSI. CBGs ranging from 118 to 159: monitor AC/HS. Provide dietary education.   Improving-anticipate continued improvement with lack of tube feeds 13.  GERD.  Continue Protonix 14. Hypomagnesemia: Started magnesium gluconate 250mg  daily  Magnesium 1.6 on 12/25, labs ordered  for tomorrow  LOS: 10 days A FACE TO FACE EVALUATION WAS PERFORMED  Ankit 08/24/20 06/26/2020, 6:10 PM

## 2020-06-27 ENCOUNTER — Inpatient Hospital Stay (HOSPITAL_COMMUNITY): Payer: Medicare Other | Admitting: Speech Pathology

## 2020-06-27 ENCOUNTER — Other Ambulatory Visit (HOSPITAL_COMMUNITY): Payer: Medicare Other

## 2020-06-27 ENCOUNTER — Inpatient Hospital Stay (HOSPITAL_COMMUNITY): Payer: Medicare Other

## 2020-06-27 ENCOUNTER — Inpatient Hospital Stay (HOSPITAL_COMMUNITY): Payer: Medicare Other | Admitting: Occupational Therapy

## 2020-06-27 LAB — COMPREHENSIVE METABOLIC PANEL
ALT: 28 U/L (ref 0–44)
AST: 28 U/L (ref 15–41)
Albumin: 2.4 g/dL — ABNORMAL LOW (ref 3.5–5.0)
Alkaline Phosphatase: 87 U/L (ref 38–126)
Anion gap: 11 (ref 5–15)
BUN: 18 mg/dL (ref 8–23)
CO2: 24 mmol/L (ref 22–32)
Calcium: 9.2 mg/dL (ref 8.9–10.3)
Chloride: 99 mmol/L (ref 98–111)
Creatinine, Ser: 0.98 mg/dL (ref 0.61–1.24)
GFR, Estimated: >60 mL/min (ref >60)
Glucose, Bld: 112 mg/dL — ABNORMAL HIGH (ref 70–99)
Potassium: 4.5 mmol/L (ref 3.5–5.1)
Sodium: 134 mmol/L — ABNORMAL LOW (ref 135–145)
Total Bilirubin: 1 mg/dL (ref 0.3–1.2)
Total Protein: 7 g/dL (ref 6.5–8.1)

## 2020-06-27 LAB — GLUCOSE, CAPILLARY
Glucose-Capillary: 106 mg/dL — ABNORMAL HIGH (ref 70–99)
Glucose-Capillary: 112 mg/dL — ABNORMAL HIGH (ref 70–99)
Glucose-Capillary: 115 mg/dL — ABNORMAL HIGH (ref 70–99)
Glucose-Capillary: 115 mg/dL — ABNORMAL HIGH (ref 70–99)
Glucose-Capillary: 125 mg/dL — ABNORMAL HIGH (ref 70–99)
Glucose-Capillary: 154 mg/dL — ABNORMAL HIGH (ref 70–99)
Glucose-Capillary: 159 mg/dL — ABNORMAL HIGH (ref 70–99)

## 2020-06-27 LAB — MAGNESIUM: Magnesium: 1.6 mg/dL — ABNORMAL LOW (ref 1.7–2.4)

## 2020-06-27 MED ORDER — CEFAZOLIN SODIUM-DEXTROSE 2-4 GM/100ML-% IV SOLN
2.0000 g | INTRAVENOUS | Status: AC
  Filled 2020-06-27: qty 100

## 2020-06-27 NOTE — Consult Note (Signed)
Chief Complaint: Patient was seen in consultation today for gastrostomy tube placement.   Referring Physician(s): Dr. Wynn BankerKirsteins  Supervising Physician: Marliss CootsSuttle, Dylan  [facility]  History of Present Illness: Gavin ReaperJames Werner is a 80 y.o. male with a medical history significant for HTN, aortic aneurysm, shingles, CAD and atrial fibrillation on Eliquis. 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 Eliquis was discontinued, Theodoro ParmaKcentra was administered and he was started on Cleviprex for blood pressure control. Repeat imaging showed the hemorrhage to be stable and due persistent weakness, dysarthria and dysphagia he was admitted to rehab 06/16/20.  Mr. [name] continues to have dysphagia requiring nutrition via cortrak. Interventional Radiology has been asked to evaluate this patient for an image-guided gastrostomy tube placement for long-term nutritional needs. This case has been reviewed and procedure approved by Dr. Elby ShowersSuttle.   Past Medical History:  Diagnosis Date  . Abdominal aortic aneurysm [facility](HCC)    [facility]  . Abdominal aortic aneurysm (HCC) 2006  . Arthritis   . Coronary artery disease   . GERD (gastroesophageal reflux disease)   . History of shingles    TIMES 2  . Hyperlipidemia   . Hypertension   . MRSA (methicillin resistant Staphylococcus aureus) infection   . Numbness    right leg from knee down worse when standing   . PONV (postoperative nausea and vomiting)   . Sleep apnea    pt scored 5 per Stop Bang tool at PAT visit 10/26/2015; [facility]  . Vertigo     Past Surgical History:  Procedure Laterality Date  . ABDOMINAL AORTIC ANEURYSM REPAIR W/ ENDOLUMINAL GRAFT     13 years ago   . ANGIOPLASTY    . branch stenting  06/08/2004   Dr. Lavonne ChickBill Gamble  . CARDIAC CATHETERIZATION   06/08/2004  . CARDIOVASCULAR STRESS TEST  02/14/2009  . I&D of left knee     . LUMBAR LAMINECTOMY/DECOMPRESSION MICRODISCECTOMY N/A 11/02/2015   Procedure: MICROLUMBAR DECOMPRESSION L3-L4, L4-L5, AND L5-S1;  Surgeon: Jene EveryJeffrey Beane, MD;  Location: [facility] ORS;  Service: Orthopedics;  Laterality: N/A;  . PICC LINE PLACE PERIPHERAL ([facility] HX)    . TRANSTHORACIC ECHOCARDIOGRAM  06/07/2004    Allergies: Crestor [rosuvastatin] and Lipitor [atorvastatin]  Medications: Prior to Admission medications   Medication Sig Start Date End Date Taking? Authorizing Provider  amLODipine (NORVASC) 10 MG tablet TAKE 1 TABLET(10 MG) BY MOUTH DAILY Patient taking differently: Take 10 mg by mouth daily. 09/23/19   Runell GessBerry, [name], MD  insulin aspart (NOVOLOG) 100 UNIT/ML injection Inject 0-15 Units into the skin every 4 (four) hours. 06/16/20   [name], [name], NP  metoprolol tartrate (LOPRESSOR) 50 MG tablet Take 1 tablet (50 mg total) by mouth 2 (two) times daily. 06/16/20   [name], [name], NP  pantoprazole (PROTONIX) 40 MG tablet Take 1 tablet (40 mg total) by mouth at bedtime. 06/16/20   [name], [name], NP  senna-docusate (SENOKOT-S) 8.6-50 MG tablet Take 1 tablet by mouth 2 (two) times daily. 06/16/20   [name], [name], NP  sodium chloride 0.9 % infusion Inject 50 mLs into the vein continuous. 06/16/20   [name], [name], NP     Family History  Problem Relation Age of Onset  . Alzheimer's disease Sister   . Heart failure  Maternal Grandmother   . Heart failure Maternal Grandfather   . Heart failure Paternal Grandmother   . Heart failure Paternal Grandfather   . Cancer Sister     Social History   Socioeconomic History  . Marital status: Married    Spouse name: Not on file  . Number of children: Not on file  . Years of education: Not on file  . Highest education level: Not on file  Occupational History  . Not on file  Tobacco Use  . Smoking status: Never Smoker  . Smokeless tobacco: Never Used   Substance and Sexual Activity  . Alcohol use: No  . Drug use: No  . Sexual activity: Not on file  Other Topics Concern  . Not on file  Social History Narrative  . Not on file   Social Determinants of Health   Financial Resource Strain: Not on file  Food Insecurity: Not on file  Transportation Needs: Not on file  Physical Activity: Not on file  Stress: Not on file  Social Connections: Not on file    Review of Systems: A 12 point ROS discussed and pertinent positives are indicated in the HPI above.  All other systems are negative.  Review of Systems  Unable to perform ROS: Other    Vital Signs: BP 128/80 (BP Location: Left Arm)   Pulse 81   Temp 98 F (36.7 C) (Oral)   Resp 18   Ht 6\' 2"  (1.88 m)   Wt 207 lb 0.2 oz (93.9 kg)   SpO2 94%   BMI 26.58 kg/m   Physical Exam Constitutional:      General: He is not in acute distress.    Comments: Aphasia  HENT:     Mouth/Throat:     Mouth: Mucous membranes are dry.     Pharynx: Oropharynx is clear.  Cardiovascular:     Rate and Rhythm: Normal rate and regular rhythm.  Pulmonary:     Effort: Pulmonary effort is normal.     Breath sounds: Normal breath sounds.  Abdominal:     General: Bowel sounds are normal.     Palpations: Abdomen is soft.  Skin:    General: Skin is warm and dry.  Neurological:     Mental Status: He is alert.     Comments: Unable to fully assess but he seems alert and oriented to person, place and time. He nods appropriately and despite the aphasia I can understand most of what he says.      Imaging: CT Code Stroke CTA Head W/WO contrast  Result Date: 06/10/2020 CLINICAL DATA:  80 year old male code stroke presentation with right side weakness and slurred speech. 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 EXAM: CT ANGIOGRAPHY HEAD AND NECK CT PERFUSION BRAIN TECHNIQUE: Multidetector CT imaging of the head and neck was performed using the standard protocol during bolus administration of  intravenous contrast. Multiplanar CT image reconstructions and MIPs were obtained to evaluate the vascular anatomy. Carotid stenosis measurements (when applicable) are obtained utilizing NASCET criteria, using the distal internal carotid diameter as the denominator. Multiphase CT imaging of the brain was performed following IV bolus contrast injection. Subsequent parametric perfusion maps were calculated using RAPID software. CONTRAST:  95mL OMNIPAQUE IOHEXOL 350 MG/ML SOLN COMPARISON:  Plain head CT 0614 hours today. FINDINGS: CT Brain Perfusion Findings: CBF (<30%) Volume: None Perfusion (Tmax>6.0s) volume: , predominantly in the cerebellar hemispheres and right PCA territory, see below. Mismatch Volume: Infarction Location:No core infarct detected. 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 Carious dentition. Ankylosis continues into the upper thoracic spine. No acute osseous abnormality identified. Upper chest: Negative. Other neck: No acute finding. Aortic arch: Tortuous aortic arch with calcified atherosclerosis. Three vessel arch configuration. Right carotid system: Brachiocephalic tortuosity and calcified plaque without stenosis. Tortuous proximal right CCA without stenosis. Calcified distal right CCA and right ICA origin and bulb plaque. Stenosis is up to 50 % with respect to the distal vessel at the distal right ICA bulb. Left carotid system: Calcified plaque at the left CCA origin without stenosis. Tortuous proximal left CCA. Mild calcified plaque at the distal left CCA and left ICA origin. Moderate calcified plaque at the distal ICA bulb with stenosis up to 65-70 % with respect to the distal vessel (series 5, image 109). The left ICA remains patent to the skull base. Vertebral arteries: Tortuous proximal right subclavian artery with abundant calcified plaque. The right vertebral artery origin remains patent without  stenosis. Non dominant right vertebral artery is patent through the V2 segment with only mild plaque. Decreasing enhancement of the right vertebral artery V3 segment which is calcified to the skull base, although without high-grade stenosis. See intracranial findings below. Tortuous and calcified proximal left subclavian artery without significant stenosis. However, calcified plaque at the left vertebral artery origin results in high-grade stenosis (series 8, image 181). The left vertebral artery is dominant. Tortuous left V1 segment. Additional calcified left V2 segment plaque although without high-grade stenosis. Decreasing distal left vertebral artery enhancement compared to the carotids. V3 calcified plaque, although no additional high-grade stenosis to the skull base. CTA HEAD Posterior circulation: Non dominant distal right vertebral artery with calcified but patent early origin of the right PICA. Distal to PICA V4 calcified plaque results in severe stenoses (series 6, image 319, 315) but the right vertebral remains patent to the vertebrobasilar junction. Dominant left V4 segment is heavily calcified and also demonstrates moderate to severe stenosis on series 6, image 310. But the vessel remains patent to the vertebrobasilar junction. Dolichoectatic and calcified basilar artery with considerably decreased enhancement compared to the ICA siphons, although the vessel does appear to remain patent (mid basilar lumen 111 Hounsfield units versus noncontrast CT 55 Hounsfield units earlier today). The basilar tip appears patent as well. PCA origins appear patent. SCA and AICA vessels not well visualized. There is a fetal type left PCA. Left PCA branches appear patent with mild to moderate irregularity. Asymmetrically diminished right PCA enhancement although no definite right PCA P1 or P2 occlusion. No CTA spot sign is evident in the ventral pontine hemorrhage. Anterior circulation: Dolichoectatic and calcified ICA  siphons are patent with considerably more enhancement than the basilar artery. No ICA siphon stenosis despite the atherosclerosis. Patent carotid termini, MCA and ACA origins. Tortuous A1 segments. Normal anterior communicating artery. Bilateral ACA branches are within normal limits. Left MCA M1 segment and bifurcation are patent without stenosis. Right MCA M1 segment and bifurcation are patent without stenosis. There is mild calcified bilateral M1 atherosclerosis. No MCA branch occlusion is identified. Venous sinuses: Early contrast timing, not evaluated. Anatomic variants: Dominant left vertebral artery. Fetal type left PCA origin. Review of the MIP images confirms the above findings IMPRESSION: 1. Poorly enhancing dolichoectatic Basilar Artery, although the vessel does appear to remain patent. 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 This  was communicated to Dr. Cheral Marker at [phone] hours by text page via the [facility] messaging system. 2. Subsequently, there is confluent abnormal posterior circulation T-max >6s on CTP, with sparing the Left PCA territory on the basis of a fetal PCA origin on that side. No infarct core detected on CTP. 3. No CTA spot sign detected in the acute ventral pontine hemorrhage. 4. Bilateral calcified carotid artery atherosclerosis, 65-70% stenosis at the Left ICA bulb. No hemodynamically significant stenosis on the right. Dolichoectatic ICA siphons. 5.  Aortic Atherosclerosis (ICD10-I70.0). Electronically Signed   By: Genevie Ann M.D.   On: 06/10/2020 07:27   CT HEAD WO CONTRAST  Result Date: 06/17/2020 CLINICAL DATA:  Stroke follow-up. Worsened swallowing and dysarthria. EXAM: CT HEAD WITHOUT CONTRAST TECHNIQUE: Contiguous axial images were obtained from the base of the skull through the vertex without intravenous contrast. COMPARISON:   06/11/2020 FINDINGS: Brain: A 1.5 cm hemorrhage in the ventral pons has not significantly changed in size but demonstrates mildly decreased density consistent with evolving blood products. There is only mild surrounding edema. No new intracranial hemorrhage, acute infarct, mass, midline shift, or extra-axial fluid collection is identified. 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 Patchy hypodensities in the cerebral white matter bilaterally are unchanged and nonspecific but compatible with moderate chronic small vessel ischemic disease. There is moderate cerebral atrophy. Vascular: Calcified atherosclerosis at the skull base. Generalized intracranial arterial dolichoectasia. Skull: No fracture or suspicious osseous lesion. Sinuses/Orbits: Paranasal sinuses and mastoid air cells are clear. Unremarkable orbits. Other: None. IMPRESSION: 1. Unchanged size of pontine hemorrhage. 2. No evidence of new intracranial abnormality. 3. Moderate chronic small vessel ischemic disease. Electronically Signed   By: Logan Bores M.D.   On: 06/17/2020 19:41   CT HEAD WO CONTRAST  Result Date: 06/11/2020 CLINICAL DATA:  Stroke follow-up EXAM: CT HEAD WITHOUT CONTRAST TECHNIQUE: Contiguous axial images were obtained from the base of the skull through the vertex without intravenous contrast. COMPARISON:  06/10/2020 FINDINGS: Brain: Small focus of hemorrhage in the brainstem is unchanged. There is periventricular hypoattenuation compatible with chronic microvascular disease. Generalized volume loss. Vascular: Vertebral and carotid atherosclerotic calcification. Skull: Normal Sinuses/Orbits: There is no paranasal sinus fluid level or advanced mucosal thickening. There is no mastoid or middle ear effusion. The orbits are normal. Other: None IMPRESSION: Unchanged small focus of hemorrhage in the brainstem. Electronically Signed   By: Ulyses Jarred M.D.   On: 06/11/2020 05:16   CT Code Stroke CTA Neck W/WO  contrast  Result Date: 06/10/2020 CLINICAL DATA:  80 year old male code stroke presentation with right side weakness and slurred speech. 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 EXAM: CT ANGIOGRAPHY HEAD AND NECK CT PERFUSION BRAIN TECHNIQUE: Multidetector CT imaging of the head and neck was performed using the standard protocol during bolus administration of intravenous contrast. Multiplanar CT image reconstructions and MIPs were obtained to evaluate the vascular anatomy. Carotid stenosis measurements (when applicable) are obtained utilizing NASCET criteria, using the distal internal carotid diameter as the denominator. Multiphase CT imaging of the brain was performed following IV bolus contrast injection. Subsequent parametric perfusion maps were calculated using RAPID software. CONTRAST:  55mL OMNIPAQUE IOHEXOL 350 MG/ML SOLN COMPARISON:  Plain head CT 0614 hours today. FINDINGS: CT Brain Perfusion Findings: CBF (<30%) Volume: None Perfusion (Tmax>6.0s) volume: 137mL, predominantly in the cerebellar hemispheres and right PCA territory, see below. Mismatch Volume: 150mL Infarction Location:No core infarct detected. CTA NECK Skeleton: Multilevel cervical spine ankylosis, probably due to  Diffuse idiopathic skeletal hyperostosis (DISH). Carious dentition. Ankylosis continues into the upper thoracic spine. No acute osseous abnormality identified. Upper chest: Negative. Other neck: No acute finding. Aortic arch: Tortuous aortic arch with calcified atherosclerosis. Three vessel arch configuration. Right carotid system: Brachiocephalic tortuosity and calcified plaque without stenosis. Tortuous proximal right CCA without stenosis. Calcified distal right CCA and right ICA origin and bulb plaque. Stenosis is up to 50 % with respect to the distal vessel at the distal right ICA bulb. Left carotid system: Calcified plaque at the left CCA origin without stenosis. Tortuous proximal left CCA. Mild calcified plaque at the  distal left CCA and left ICA origin. Moderate calcified plaque at the distal ICA bulb with stenosis up to 65-70 % with respect to the distal vessel (series 5, image 109). The left ICA remains patent to the skull base. Vertebral arteries: Tortuous proximal right subclavian artery with abundant calcified plaque. The right vertebral artery origin remains patent without stenosis. Non dominant right vertebral artery is patent through the V2 segment with only mild plaque. Decreasing enhancement of the right vertebral artery V3 segment which is calcified to the skull base, although without high-grade stenosis. See intracranial findings below. Tortuous and calcified proximal left subclavian artery without significant stenosis. However, calcified plaque at the left vertebral artery origin results in high-grade stenosis (series 8, image 181). The left vertebral artery is dominant. Tortuous left V1 segment. Additional calcified left V2 segment plaque although without high-grade stenosis. Decreasing distal left vertebral artery enhancement compared to the carotids. V3 calcified plaque, although no additional high-grade stenosis to the skull base. CTA HEAD Posterior circulation: Non dominant distal right vertebral artery with calcified but patent early origin of the right PICA. Distal to PICA V4 calcified plaque results in severe stenoses (series 6, image 319, 315) but the right vertebral remains patent to the vertebrobasilar junction. Dominant left V4 segment is heavily calcified and also demonstrates moderate to severe stenosis on series 6, image 310. But the vessel remains patent to the vertebrobasilar junction. Dolichoectatic and calcified basilar artery with considerably decreased enhancement compared to the ICA siphons, although the vessel does appear to remain patent (mid basilar lumen 111 Hounsfield units versus noncontrast CT 55 Hounsfield units earlier today). The basilar tip appears patent as well. PCA origins appear  patent. SCA and AICA vessels not well visualized. There is a fetal type left PCA. Left PCA branches appear patent with mild to moderate irregularity. Asymmetrically diminished right PCA enhancement although no definite right PCA P1 or P2 occlusion. No CTA spot sign is evident in the ventral pontine hemorrhage. Anterior circulation: Dolichoectatic and calcified ICA siphons are patent with considerably more enhancement than the basilar artery. No ICA siphon stenosis despite the atherosclerosis. Patent carotid termini, MCA and ACA origins. Tortuous A1 segments. Normal anterior communicating artery. Bilateral ACA branches are within normal limits. Left MCA M1 segment and bifurcation are patent without stenosis. Right MCA M1 segment and bifurcation are patent without stenosis. There is mild calcified bilateral M1 atherosclerosis. No MCA branch occlusion is identified. Venous sinuses: Early contrast timing, not evaluated. Anatomic variants: Dominant left vertebral artery. Fetal type left PCA origin. Review of the MIP images confirms the above findings IMPRESSION: 1. Poorly enhancing dolichoectatic Basilar Artery, although the vessel does appear to remain patent. 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 This was communicated to Dr. Otelia Limes at [phone] hours  by text page via the [facility] messaging system. 2. Subsequently, there is confluent abnormal posterior circulation T-max >6s on CTP, with sparing the Left PCA territory on the basis of a fetal PCA origin on that side. No infarct core detected on CTP. 3. No CTA spot sign detected in the acute ventral pontine hemorrhage. 4. Bilateral calcified carotid artery atherosclerosis, 65-70% stenosis at the Left ICA bulb. No hemodynamically significant stenosis on the right. Dolichoectatic ICA siphons. 5.  Aortic Atherosclerosis  (ICD10-I70.0). Electronically Signed   By: Odessa Fleming M.D.   On: 06/10/2020 07:27   MR BRAIN WO CONTRAST  Result Date: 06/10/2020 CLINICAL DATA:  Stroke.  Intracranial hemorrhage. EXAM: MRI HEAD WITHOUT CONTRAST TECHNIQUE: Multiplanar, multiecho pulse sequences of the brain and surrounding structures were obtained without intravenous contrast. COMPARISON:  CT head 06/10/2020 FINDINGS: Brain: Negative for acute ischemic infarction. Mild chronic microvascular ischemic change in the white matter bilaterally. 14 mm hemorrhage in the ventral pons as noted on CT. No interval change. No underlying mass. Numerous other areas of chronic microhemorrhage throughout the cerebellum and cerebral hemispheres bilaterally as well as in the thalamus bilaterally and right basal ganglia. Mild atrophy without hydrocephalus or mass lesion. Vascular: Dolichoectasia of the distal vertebral arteries and basilar artery. Ectatic and tortuous internal carotid arteries bilaterally. Normal arterial flow voids. Skull and upper cervical spine: No acute skeletal abnormality. C1-2 arthropathy with pannus posterior to the dens without significant spinal stenosis. Sinuses/Orbits: Paranasal sinuses clear.  Negative orbit Other: None IMPRESSION: Negative for acute ischemic infarct 14 mm acute hemorrhage in the ventral pons without underlying mass or change from earlier today. Numerous areas of chronic microhemorrhage in the brain bilaterally. Correlate with history of poorly controlled hypertension. If no such history is available, consider cerebral amyloid. Electronically Signed   By: Marlan Palau M.D.   On: 06/10/2020 13:21   CT Code Stroke Cerebral Perfusion with contrast  Result Date: 06/10/2020 CLINICAL DATA:  80 year old male code stroke presentation with right side weakness and slurred speech. 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 EXAM: CT ANGIOGRAPHY HEAD AND NECK CT PERFUSION BRAIN TECHNIQUE: Multidetector CT imaging of the head  and neck was performed using the standard protocol during bolus administration of intravenous contrast. Multiplanar CT image reconstructions and MIPs were obtained to evaluate the vascular anatomy. Carotid stenosis measurements (when applicable) are obtained utilizing NASCET criteria, using the distal internal carotid diameter as the denominator. Multiphase CT imaging of the brain was performed following IV bolus contrast injection. Subsequent parametric perfusion maps were calculated using RAPID software. CONTRAST:  95mL OMNIPAQUE IOHEXOL 350 MG/ML SOLN COMPARISON:  Plain head CT 0614 hours today. FINDINGS: CT Brain Perfusion Findings: CBF (<30%) Volume: None Perfusion (Tmax>6.0s) volume: , predominantly in the cerebellar hemispheres and right PCA territory, see below. Mismatch Volume: Infarction Location:No core infarct detected. 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 Carious dentition. Ankylosis continues into the upper thoracic spine. No acute osseous abnormality identified. Upper chest: Negative. Other neck: No acute finding. Aortic arch: Tortuous aortic arch with calcified atherosclerosis. Three vessel arch configuration. Right carotid system: Brachiocephalic tortuosity and calcified plaque without stenosis. Tortuous proximal right CCA without stenosis. Calcified distal right CCA and right ICA origin and bulb plaque. Stenosis is up to 50 % with respect to the distal vessel at the distal right ICA bulb. Left carotid system: Calcified plaque at the left CCA origin without stenosis. Tortuous proximal left CCA. Mild calcified plaque at  the distal left CCA and left ICA origin. Moderate calcified plaque at the distal ICA bulb with stenosis up to 65-70 % with respect to the distal vessel (series 5, image 109). The left ICA remains patent to the skull base. Vertebral arteries: Tortuous proximal right subclavian artery with  abundant calcified plaque. The right vertebral artery origin remains patent without stenosis. Non dominant right vertebral artery is patent through the V2 segment with only mild plaque. Decreasing enhancement of the right vertebral artery V3 segment which is calcified to the skull base, although without high-grade stenosis. See intracranial findings below. Tortuous and calcified proximal left subclavian artery without significant stenosis. However, calcified plaque at the left vertebral artery origin results in high-grade stenosis (series 8, image 181). The left vertebral artery is dominant. Tortuous left V1 segment. Additional calcified left V2 segment plaque although without high-grade stenosis. Decreasing distal left vertebral artery enhancement compared to the carotids. V3 calcified plaque, although no additional high-grade stenosis to the skull base. CTA HEAD Posterior circulation: Non dominant distal right vertebral artery with calcified but patent early origin of the right PICA. Distal to PICA V4 calcified plaque results in severe stenoses (series 6, image 319, 315) but the right vertebral remains patent to the vertebrobasilar junction. Dominant left V4 segment is heavily calcified and also demonstrates moderate to severe stenosis on series 6, image 310. But the vessel remains patent to the vertebrobasilar junction. Dolichoectatic and calcified basilar artery with considerably decreased enhancement compared to the ICA siphons, although the vessel does appear to remain patent (mid basilar lumen 111 Hounsfield units versus noncontrast CT 55 Hounsfield units earlier today). The basilar tip appears patent as well. PCA origins appear patent. SCA and AICA vessels not well visualized. There is a fetal type left PCA. Left PCA branches appear patent with mild to moderate irregularity. Asymmetrically diminished right PCA enhancement although no definite right PCA P1 or P2 occlusion. No CTA spot sign is evident in the  ventral pontine hemorrhage. Anterior circulation: Dolichoectatic and calcified ICA siphons are patent with considerably more enhancement than the basilar artery. No ICA siphon stenosis despite the atherosclerosis. Patent carotid termini, MCA and ACA origins. Tortuous A1 segments. Normal anterior communicating artery. Bilateral ACA branches are within normal limits. Left MCA M1 segment and bifurcation are patent without stenosis. Right MCA M1 segment and bifurcation are patent without stenosis. There is mild calcified bilateral M1 atherosclerosis. No MCA branch occlusion is identified. Venous sinuses: Early contrast timing, not evaluated. Anatomic variants: Dominant left vertebral artery. Fetal type left PCA origin. Review of the MIP images confirms the above findings IMPRESSION: 1. Poorly enhancing dolichoectatic Basilar Artery, although the vessel does appear to remain patent. 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 This was communicated to Dr. Otelia Limes at [phone] hours by text page via the [facility] messaging system. 2. Subsequently, there is confluent abnormal posterior circulation T-max >6s on CTP, with sparing the Left PCA territory on the basis of a fetal PCA origin on that side. No infarct core detected on CTP. 3. No CTA spot sign detected in the acute ventral pontine hemorrhage. 4. Bilateral calcified carotid artery atherosclerosis, 65-70% stenosis at the Left ICA bulb. No hemodynamically significant stenosis on the right. Dolichoectatic ICA siphons. 5.  Aortic Atherosclerosis (ICD10-I70.0). Electronically Signed   By: Odessa Fleming M.D.   On: 06/10/2020 07:27   Chest [facility] [address]  Result Date: 06/10/2020 CLINICAL DATA:  80 year old male code stroke presentation, pontine hemorrhage and decreased posterior circulation perfusion. EXAM: PORTABLE CHEST 1 VIEW  COMPARISON:  CT, CTA and CTP head today. Chest radiographs 06/28/2017 and earlier. FINDINGS: Portable AP semi upright view at 0730 hours. Mildly lower lung volumes. Stable cardiac size and mediastinal contours. Chronic thoracic aortic endograft. Allowing for portable technique the lungs are clear. No pneumothorax or pleural effusion. No acute osseous abnormality identified. IMPRESSION: 1.  No acute cardiopulmonary abnormality. 2. Chronic thoracic Aortic Endograft. Electronically Signed   By: Odessa Fleming M.D.   On: 06/10/2020 07:51   DG Swallowing Func-Speech Pathology  Result Date: 06/11/2020 Objective Swallowing Evaluation: Type of Study: Bedside Swallow Evaluation  Patient Details Name: Dreux Mcgroarty MRN: [id number] Date of Birth: 08-03-1940 Today's Date: 06/11/2020 Time: SLP Start Time (ACUTE ONLY): 1137 -SLP Stop Time (ACUTE ONLY): 1155 SLP Time Calculation (min) (ACUTE ONLY): 18 min Past Medical History: Past Medical History: Diagnosis Date . Abdominal aortic aneurysm [facility])   status post endoluminal stent graft at [facility] in 2006 . Abdominal aortic aneurysm (HCC) 2006 . Arthritis  . Coronary artery disease  . GERD (gastroesophageal reflux disease)  . History of shingles   TIMES 2 . Hyperlipidemia  . Hypertension  . MRSA (methicillin resistant Staphylococcus aureus) infection  . Numbness   right leg from knee down worse when standing  . PONV (postoperative nausea and vomiting)  . Sleep apnea   pt scored 5 per Stop Bang tool at PAT visit 10/26/2015; results sent to [facility] NP . Vertigo  Past Surgical History: Past Surgical History: Procedure Laterality Date . ABDOMINAL AORTIC ANEURYSM REPAIR W/ ENDOLUMINAL GRAFT    13 years ago  . ANGIOPLASTY   . branch stenting  06/08/2004  Dr. Lavonne Chick . CARDIAC CATHETERIZATION  06/08/2004 . CARDIOVASCULAR STRESS TEST  02/14/2009 . I&D of left knee    . LUMBAR LAMINECTOMY/DECOMPRESSION MICRODISCECTOMY N/A 11/02/2015  Procedure: MICROLUMBAR DECOMPRESSION L3-L4, L4-L5,  AND L5-S1;  Surgeon: Jene Every, MD;  Location: [facility] ORS;  Service: Orthopedics;  Laterality: N/A; . PICC LINE PLACE PERIPHERAL ([facility] HX)   . TRANSTHORACIC ECHOCARDIOGRAM  06/07/2004 HPI: 80 y.o. male with atrial fibrillation on Eliquis, presenting with an acute anterior midline pontine hemorrhage  Neuro report includes  severe dysarthria, right facial droop and right hemiplegia.  Subjective: Pt awake, alert, pleasant, participative Assessment / Plan / Recommendation CHL IP CLINICAL IMPRESSIONS 06/11/2020 Clinical Impression Pt presents with moderate oropharyngeal dysphagia c/b decreased lingual strength and coordination, premature spillage, delayed swallo initiation, incomplete laryngeal closure and diminished sensation.  These deficits led to aspiration of thin and nectar thick liquid before and during the swallow with variable cough response.  Aspiration of thin liquid occurred at posterior trachea prior to the swallow 2/2 penetration of bolus through interarytenoid space which traveled on the vocal folds to the anterior portion of the trachea.  With nectar thick liquid by cup, initially only transient penetration was observed but as study progressed, pt aspirated a significant amount of bolus prior to the swallow.  By straw, there was penetration of nectar thick liquid to the level of the vocal folds with aspiration during the swallow.  Cough response was inconsistent and significantly delayed.  Cough was ineffective to clear aspiration and penetration whether cued or reflexive.  There was no penetration or aspiration of honey thick liquid or solids.  There was prolonged oral phase with puree and solid textuers which increased with complexity of solid. With  pill simulation there was prompt oral transit of tablet with honey thick liquid and no penetration or aspiraiton.  Esophageal transit of tablet appears to be [facility] as it could not be located on esphageal sweep.   Recommend chopped/ground diet with honey thick  liquids. SLP Visit Diagnosis Dysphagia, oropharyngeal phase (R13.12) Attention and concentration deficit following -- Frontal lobe and executive function deficit following -- Impact on safety and function Moderate aspiration risk   CHL IP TREATMENT RECOMMENDATION 06/11/2020 Treatment Recommendations Therapy as outlined in treatment plan below   Prognosis 06/11/2020 Prognosis for Safe Diet Advancement Good Barriers to Reach Goals -- Barriers/Prognosis Comment -- CHL IP DIET RECOMMENDATION 06/11/2020 SLP Diet Recommendations Dysphagia 2 (Fine chop) solids;Honey thick liquids Liquid Administration via Cup;No straw Medication Administration Whole meds with liquid Compensations Slow rate;Small sips/bites;Follow solids with liquid;Lingual sweep for clearance of pocketing Postural Changes Seated upright at 90 degrees   CHL IP OTHER RECOMMENDATIONS 06/11/2020 Recommended Consults -- Oral Care Recommendations Oral care BID Other Recommendations --   CHL IP FOLLOW UP RECOMMENDATIONS 06/11/2020 Follow up Recommendations Inpatient Rehab   CHL IP FREQUENCY AND DURATION 06/11/2020 Speech Therapy Frequency (ACUTE ONLY) min 2x/week Treatment Duration 2 weeks      CHL IP ORAL PHASE 06/11/2020 Oral Phase Impaired Oral - Pudding Teaspoon -- Oral - Pudding Cup -- Oral - Honey Teaspoon -- Oral - Honey Cup Premature spillage Oral - Nectar Teaspoon -- Oral - Nectar Cup Premature spillage Oral - Nectar Straw Premature spillage Oral - Thin Teaspoon -- Oral - Thin Cup Premature spillage Oral - Thin Straw -- Oral - Puree Decreased bolus cohesion;Premature spillage Oral - Mech Soft Lingual/palatal residue;Lingual pumping Oral - Regular Decreased bolus cohesion;Premature spillage;Piecemeal swallowing;Lingual/palatal residue;Lingual pumping;Reduced posterior propulsion Oral - Multi-Consistency -- Oral - Pill WFL Oral Phase - Comment --  CHL IP PHARYNGEAL PHASE 06/11/2020 Pharyngeal Phase Impaired Pharyngeal- Pudding Teaspoon -- Pharyngeal --  Pharyngeal- Pudding Cup -- Pharyngeal -- Pharyngeal- Honey Teaspoon -- Pharyngeal -- Pharyngeal- Honey Cup WFL Pharyngeal -- Pharyngeal- Nectar Teaspoon -- Pharyngeal -- Pharyngeal- Nectar Cup Delayed swallow initiation-pyriform sinuses;Reduced airway/laryngeal closure;Penetration/Aspiration before swallow;Penetration/Aspiration during swallow;Moderate aspiration Pharyngeal Material enters airway, passes BELOW cords without attempt by patient to eject out (silent aspiration) Pharyngeal- Nectar Straw Delayed swallow initiation-pyriform sinuses;Reduced airway/laryngeal closure;Penetration/Aspiration before swallow;Penetration/Aspiration during swallow Pharyngeal Material enters airway, passes BELOW cords and not ejected out despite cough attempt by patient Pharyngeal- Thin Teaspoon -- Pharyngeal -- Pharyngeal- Thin Cup Delayed swallow initiation-pyriform sinuses;Penetration/Aspiration before swallow Pharyngeal Material enters airway, passes BELOW cords and not ejected out despite cough attempt by patient Pharyngeal- Thin Straw -- Pharyngeal -- Pharyngeal- Puree WFL Pharyngeal Material does not enter airway Pharyngeal- Mechanical Soft WFL Pharyngeal Material does not enter airway Pharyngeal- Regular WFL Pharyngeal Material does not enter airway Pharyngeal- Multi-consistency -- Pharyngeal -- Pharyngeal- Pill Delayed swallow initiation-vallecula Pharyngeal Material does not enter airway Pharyngeal Comment --  CHL IP CERVICAL ESOPHAGEAL PHASE 06/11/2020 Cervical Esophageal Phase WFL Pudding Teaspoon -- Pudding Cup -- Honey Teaspoon -- Honey Cup -- Nectar Teaspoon -- Nectar Cup -- Nectar Straw -- Thin Teaspoon -- Thin Cup -- Thin Straw -- Puree -- Mechanical Soft -- Regular -- Multi-consistency -- Pill -- Cervical Esophageal Comment -- Kerrie PleasureLeigh E Borum, MA, CCC-SLP Acute Rehabilitation Services Office: [phone] 06/11/2020, 1:02 PM              ECHOCARDIOGRAM COMPLETE  Result Date: 06/10/2020    ECHOCARDIOGRAM REPORT    Patient Name:   Gavin ReaperJAMES Maura Date of Exam: 06/10/2020  Medical Rec #:  [id number]   Height:       74.0 in Accession #:    [id number]  Weight:       218.3 lb Date of Birth:  1940-07-25  BSA:          2.256 m Patient Age:    80 years    BP:           135/91 mmHg Patient Gender: M           HR:           82 bpm. Exam Location:  Inpatient Procedure: 2D Echo, Cardiac Doppler and Color Doppler Indications:    Stroke  History:        Patient has prior history of Echocardiogram examinations, most                 recent 09/14/2019. Arrythmias:Atrial Fibrillation; Risk                 Factors:Dyslipidemia and Hypertension.  Sonographer:    Ross Ludwig RDCS (AE) Referring Phys: [id number] [facility]  Sonographer Comments: No subcostal window. IMPRESSIONS  1. Left ventricular ejection fraction, by estimation, is 60 to 65%. The left ventricle has normal function. The left ventricle has no regional wall motion abnormalities. There is moderate left ventricular hypertrophy. Left ventricular diastolic function  could not be evaluated.  2. Right ventricular systolic function is normal. The right ventricular size is normal. Tricuspid regurgitation signal is inadequate for assessing PA pressure.  3. Left atrial size was mild to moderately dilated.  4. The mitral valve is normal in structure. Trivial mitral valve regurgitation.  5. The aortic valve is tricuspid. There is mild calcification of the aortic valve. There is mild thickening of the aortic valve. Aortic valve regurgitation is mild. Mild aortic valve stenosis.  6. Aortic dilatation noted. There is mild dilatation of the ascending aorta, measuring 42 mm. Comparison(s): No significant change from prior study. Conclusion(s)/Recommendation(s): No intracardiac source of embolism detected on this transthoracic study. A transesophageal echocardiogram is recommended to exclude cardiac source of embolism if clinically indicated. FINDINGS  Left Ventricle: Left ventricular ejection fraction, by  estimation, is 60 to 65%. The left ventricle has normal function. The left ventricle has no regional wall motion abnormalities. The left ventricular internal cavity size was normal in size. There is  moderate left ventricular hypertrophy. Left ventricular diastolic function could not be evaluated due to atrial fibrillation. Left ventricular diastolic function could not be evaluated. Right Ventricle: The right ventricular size is normal. Right vetricular wall thickness was not well visualized. Right ventricular systolic function is normal. Tricuspid regurgitation signal is inadequate for assessing PA pressure. Left Atrium: Left atrial size was mild to moderately dilated. Right Atrium: Right atrial size was normal in size. Pericardium: There is no evidence of pericardial effusion. Presence of pericardial fat pad. Mitral Valve: The mitral valve is normal in structure. Trivial mitral valve regurgitation. Tricuspid Valve: The tricuspid valve is normal in structure. Tricuspid valve regurgitation is trivial. No evidence of tricuspid stenosis. Aortic Valve: The aortic valve is tricuspid. There is mild calcification of the aortic valve. There is mild thickening of the aortic valve. Aortic valve regurgitation is mild. Aortic regurgitation PHT measures 604 msec. Mild aortic stenosis is present. Aortic valve mean gradient measures 7.7 mmHg. Aortic valve peak gradient measures 13.9 mmHg. Aortic valve area, by VTI measures 1.95 cm. Pulmonic Valve: The pulmonic valve was not well visualized. Pulmonic valve regurgitation is not visualized.  Aorta: Aortic dilatation noted. There is mild dilatation of the ascending aorta, measuring 42 mm. Venous: The inferior vena cava was not well visualized. IAS/Shunts: The atrial septum is grossly normal.  LEFT VENTRICLE PLAX 2D LVIDd:         4.80 cm LVIDs:         3.10 cm LV PW:         1.70 cm LV IVS:        1.90 cm LVOT diam:     2.20 cm LV SV:         58 LV SV Index:   26 LVOT Area:     3.80  cm  RIGHT VENTRICLE RV Basal diam:  2.90 cm RV S prime:     8.81 cm/s TAPSE (M-mode): 1.4 cm LEFT ATRIUM             Index       RIGHT ATRIUM           Index LA diam:        4.80 cm 2.13 cm/m  RA Area:     15.40 cm LA Vol (A2C):   70.0 ml 31.03 ml/m RA Volume:   28.90 ml  12.81 ml/m LA Vol (A4C):   66.8 ml 29.61 ml/m LA Biplane Vol: 69.9 ml 30.99 ml/m  AORTIC VALVE AV Area (Vmax):    1.74 cm AV Area (Vmean):   1.78 cm AV Area (VTI):     1.95 cm AV Vmax:           186.33 cm/s AV Vmean:          128.667 cm/s AV VTI:            0.299 m AV Peak Grad:      13.9 mmHg AV Mean Grad:      7.7 mmHg LVOT Vmax:         85.50 cm/s LVOT Vmean:        60.267 cm/s LVOT VTI:          0.153 m LVOT/AV VTI ratio: 0.51 AI PHT:            604 msec  AORTA Ao Root diam: 3.90 cm Ao Asc diam:  4.20 cm  SHUNTS Systemic VTI:  0.15 m Systemic Diam: 2.20 cm Jodelle Red MD Electronically signed by Jodelle Red MD Signature Date/Time: 06/10/2020/8:24:31 PM    Final    CT HEAD CODE STROKE WO CONTRAST  Result Date: 06/10/2020 CLINICAL DATA:  Code stroke. 80 year old male with right side weakness and slurred speech. EXAM: CT HEAD WITHOUT CONTRAST TECHNIQUE: Contiguous axial images were obtained from the base of the skull through the vertex without intravenous contrast. COMPARISON:  Head CT 06/11/2014 [facility] FINDINGS: Brain: Generalized cerebral volume loss since 2015. Mild ex vacuo ventricular enlargement now. Oval up to 13 mm hyperdensity throughout the ventral lower pons best seen on sagittal image 31 of series 8 most resembles an acute intra-axial hemorrhage (series 4, image 11). No significant edema or regional mass effect at this time. Estimated blood volume is 1 mL. No extra-axial or intraventricular blood identified. No posterior fossa mass effect. Supratentorial Patchy and confluent bilateral cerebral white matter hypodensity and heterogeneity in the bilateral deep gray nuclei, including a chronic  left thalamic lacune which was present in 2015 (series 4, image 18). No cortically based acute infarct identified. 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 Skull: No acute osseous abnormality identified. Sinuses/Orbits: Visualized paranasal sinuses and mastoids are  stable and well pneumatized. Other: No acute orbit or scalp soft tissue finding. ASPECTS [facility] Stroke Program Early CT Score) Total score (0-10 with 10 being normal): 10, but note acute brainstem hemorrhage. IMPRESSION: 1. 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 No mass effect, extra-axial- or intraventricular extension of blood at this time. 2. 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 3. These results were communicated to Dr. Otelia Limes at 6:25 am on 06/10/2020 by text page via the [facility] messaging system. Electronically Signed   By: Odessa Fleming M.D.   On: 06/10/2020 06:26    Labs:  CBC: Recent Labs    06/14/20 0247 06/15/20 0232 06/17/20 0457 06/22/20 1052  WBC 7.3 8.2 10.9* 9.3  HGB 15.1 14.9 16.0 15.7  HCT 45.8 42.9 47.7 46.8  PLT 140* 149* 169 257    COAGS: Recent Labs    06/10/20 0606  INR 1.2  APTT 30    BMP: Recent Labs    08/26/19 1117 11/23/19 1849 06/10/20 0606 06/14/20 0247 06/15/20 0232 06/17/20 0457 06/27/20 0656  NA 142 140   < > 141 138 136 134*  K 4.7 4.3   < > 4.1 3.8 4.1 4.5  CL 102 105   < > 106 107 103 99  CO2 25 27   < > 26 22 22 24   GLUCOSE 95 146*   < > 118* 111* 122* 112*  BUN 15 15   < > 16 15 19 18   CALCIUM 9.2 9.1   < > 8.8* 8.7* 8.8* 9.2  CREATININE 1.51* 1.37*   < > 1.02 1.01 1.05 0.98  GFRNONAA 44* 49*   < > >60 >60 >60 >60  GFRAA 51* 57*  --   --   --   --   --    < > = values in this interval not displayed.    LIVER FUNCTION TESTS: Recent Labs    05/27/20 0943 06/10/20 0606 06/17/20 0457 06/27/20 0656  BILITOT 0.7 1.5* 1.7* 1.0   AST 24 31 45* 28  ALT 17 23 36 28  ALKPHOS 90 58 71 87  PROT 6.7 6.6 6.5 7.0  ALBUMIN 3.8 3.0* 2.5* 2.4*    TUMOR MARKERS: No results for input(s): AFPTM, CEA, CA199, CHROMGRNA in the last 8760 hours.  Assessment and Plan:  Hemorrhagic stroke; ongoing dysphagia: Demont Linford, 80 year old male, is tentatively scheduled for an image-guided gastrostomy tube placement 06/29/20 at the [facility] Interventional Radiology department. Imaging has been reviewed and the patient will require PO contrast administration 24 hours prior to his procedure due to marginal window for percutaneous access. He has a new cortrak that was placed today. PO contrast will be sent to the floor the morning of 06/29/19 and this has been relayed to his bedside RN. Consent was obtained from the patient's wife.   Risks and benefits image guided gastrostomy tube placement was discussed with the patient including, but not limited to the need for a barium enema during the procedure, bleeding, infection, peritonitis and/or damage to adjacent structures.  All of the patient's questions were answered, patient is agreeable to proceed.  The patient will be NPO after midnight 06/29/20 and AM labs will be ordered. Oral contrast will be ordered to be administered in the morning 06/28/20.   Consent signed and in chart.  Thank you for this interesting consult.  I greatly enjoyed meeting Zymeir Salminen and look forward to participating in their care.  A copy of this report was sent to the requesting provider on this date.  Electronically Signed: Alwyn Ren, AGACNP-BC [phone] 06/27/2020, 11:59 AM   I spent a total of 20 Minutes    in face to face in clinical consultation, greater than 50% of which was counseling/coordinating care for gastrostomy tube placement.

## 2020-06-27 NOTE — Procedures (Signed)
Cortrak  Person Inserting Tube:  Ula Couvillon, RD Tube Type:  Cortrak - 43 inches Tube Location:  Right nare Initial Placement:  Stomach Secured by: Bridle Technique Used to Measure Tube Placement:  Documented cm marking at nare/ corner of mouth Cortrak Secured At:  66 cm   No x-ray is required. RN may begin using tube.    If the tube becomes dislodged please keep the tube and contact the Cortrak team at [email or web address] (password [email or web address]) for replacement.  If after hours and replacement cannot be delayed, place a NG tube and confirm placement with an abdominal x-ray.    Vanessa Kick RD, LDN Clinical Nutrition Pager listed in AMION

## 2020-06-27 NOTE — Progress Notes (Signed)
Occupational Therapy Session Note  Patient Details  Name: [name] MRN: [id number] Date of Birth: 06/16/1941  Today's Date: 06/27/2020 OT Individual Time: 9509-3267 and 1445-1526 OT Individual Time Calculation (min): 55 min and 41 min  Skilled Therapeutic Interventions/Progress Updates:    Pt greeted in bed with no c/o pain. Agreeable to engage in bathing/dressing tasks. When asked if his brief was soiled, pt responded "probably." Brief found to be saturated with urine. Rolling Rt>Lt completed with Max A of 1 while 2nd helper assisted with perihygiene tasks. Pt able to reach with cuing for the bedrail using his Lt hand. After clean brief was donned, pt completed supine<sit with +2 assist. Worked on sitting balance while engaging in UB bathing/dressing and grooming tasks. Pt able to maintain sitting balance with CGA for ~5-10 seconds, unable to initiate balance corrections when losing balance forward or laterally, needing Max A of 1 to prevent falling over. Worked on joint protection when pt assisted moving his affected UE and Rt attention when washing/dressing the Rt side. +2 for returning to supine and for boosting up in bed. Pt assisted OT with lathering his hair with shampoo using the shampoo cap and also washing B LEs with cuing and assistance. Pt able to tolerate reclined figure 4 position to stretch the affected hip. Pt remained in bed at close of session, left with all needs within reach and bed alarm set.   2nd Session 1:1 tx (41 min) Pt greeted in bed with no c/o pain, just congested and worked on pt managing his secretions using the suction with supervision. After Teds were donned, supine<sit completed with +2 assist. Pt able to maintain sitting balance with Mod A while 2nd helper donned his Rt UE sling to protect his affected shoulder. Four sit<stands in [employer] completed with +2 assist from elevated bed. Worked on pt anterior weight shifting to reach the Stedy bar and forward gaze/upright  alignment while semi perched on Stedy paddles. Had him visually attend to an object slightly above eye level, pt able to maintain sitting balance with supervision assist for 1 minute intervals, Rt forearm propped on the Stedy bar for weightbearing. No s/s orthostatics. He then returned to the bed with +2 assist. Left him with all needs within reach, [facility] elevated, and bed alarm set.   Also retrieved a BSC bucket for the room to practice OOB toileting this week  Therapy Documentation Precautions:  Precautions Precautions: Fall Precaution Comments: R hemiplegia with subluxation Restrictions Weight Bearing Restrictions: No Pain: Pain Assessment Pain Scale: Faces Faces Pain Scale: No hurt ADL: ADL Eating: Maximal assistance Grooming: Maximal assistance Upper Body Bathing: Maximal assistance Where Assessed-Upper Body Bathing: Bed level Lower Body Bathing: Dependent Where Assessed-Lower Body Bathing: Bed level Upper Body Dressing: Maximal assistance Where Assessed-Upper Body Dressing: Bed level Lower Body Dressing: Dependent Where Assessed-Lower Body Dressing: Bed level Toileting: Not assessed Toilet Transfer: Not assessed      Therapy/Group: Individual Therapy  Cataleya Cristina A Rakesha Dalporto 06/27/2020, 12:18 PM

## 2020-06-27 NOTE — Progress Notes (Signed)
Physical Therapy Session Note  Patient Details  Name: [name] MRN: [id number] Date of Birth: May 05, 1941  Today's Date: 06/27/2020 PT Individual Time: 1349-1416 PT Individual Time Calculation (min): 27 min   Short Term Goals: Week 1:  PT Short Term Goal 1 (Week 1): Pt will maintain static sitting balance with min A x 5 min PT Short Term Goal 1 - Progress (Week 1): Met PT Short Term Goal 2 (Week 1): Pt will perform least restrictive transfer OOB with assist x 2 PT Short Term Goal 2 - Progress (Week 1): Met PT Short Term Goal 3 (Week 1): Pt will initiate w/c mobility PT Short Term Goal 3 - Progress (Week 1): Not met PT Short Term Goal 4 (Week 1): Pt will tolerate sitting OOB x 1 hour PT Short Term Goal 4 - Progress (Week 1): Met  Skilled Therapeutic Interventions/Progress Updates:      Therapy Documentation Precautions:  Precautions Precautions: Fall Precaution Comments: R hemiplegia with subluxation Restrictions Weight Bearing Restrictions: No   General:   Patient supine in bed upon PT arrival. Patient alert and agreeable to PT session. Patient denied pain during session.  Therapeutic Activity: Bed Mobility: Bed rolling facilitated for training in weight assisted log roll to each side. Heavy verbal and tactile cues for technique. Min/ Mod A for roll to R side with hand held assist to L hand. Total assist to complete roll to R side. RLE required Max A with apparent muscle activation to reach 90/ 90 leg postioning. RUE flaccid requiring total assist for all movement and positioning. Supine --> sit with verbal instructions for each step and Mod A to bring BLE to EOB, Max A for UB assist. Return to supine required Mod/ Max A. Final positioning in bed required Max A +2 with foot of bed elevated and pt assist with LLE push.  Provided verbal cues for technique, positioning and balance . Neuromuscular Re-ed: NMR facilitated in supine with adductor squeeze to RLE and pt able to catch ER  fallout at ~60 degrees and holds with slow fatigue demonstrated with slow ER fallout. Performed x3.   Facilitated in seated position on EOB with reaching and AP pull/ push against resistance activities. Pt able to spend ~32mn at EOB with reach to targets at various heights and distances with ability to see and reach each target. Pt is also able to resist this PT's push posteriorly and pull anteriorly in order to maintain upright seated balance. However with fatigue after 778m, balance drift to R, then lean to L, then posterior lean became more difficult to correct without Mod/ Max A despite vc.     Patient supine in bed at end of session with breaks locked, bed alarm set, and all needs within reach.   Therapy/Group: Individual Therapy  JuAlger SimonsPT AlLars MassonPT, DPT, CBIS  06/27/2020, 3:47 PM

## 2020-06-27 NOTE — Progress Notes (Signed)
Speech Language Pathology Daily Session Note  Patient Details  Name: [name] MRN: [id number] Date of Birth: [birth year]  Today's Date: 06/27/2020 SLP Individual Time: 0730-0825 SLP Individual Time Calculation (min): 55 min  Short Term Goals: Week 2: SLP Short Term Goal 1 (Week 2): Pt will participate in PO trials with improvements in ability orally manipulate bolus and reduction in overt s/sx aspriation to demosntrate readiness for repeat MBSS to determine least restrictive PO diet. SLP Short Term Goal 2 (Week 2): Pt will utilize compensatory strategies to increase speech intelligibility to ~75% at the word and phrase level with Mod A cues. SLP Short Term Goal 3 (Week 2): Pt will demonstrate recall of novel and daily information with mod A verbal cues. SLP Short Term Goal 4 (Week 2): Pt will demonstrate sustained attention and alertness in 20 minute intervals with min A verbal cues for redirection.  Skilled Therapeutic Interventions: Pt was seen for skilled ST targeting dysphagia and speech goals. SLP facilitated session with trials of ice and tsp thin H2O after oral care. Pt demonstrated some noteworthy improvements today in ability to orally manipulate and control boluses, although reduced labial seal still noted. He also demonstrated much improved ability to maintain alertness, for entire session with only 1 cue near beginning of session for arousal. He exhibited 1 throat clear and 1 cough out of 10 presentations of ice chips. Cough noted in 2 out of 5 tsp presentations of thin H2O. Pt is making good progress toward repeat MBSS. Pt also with improved recall and ability to implement slower rate to increase speech intelligibility throughout session. He was ~70-75% intelligible at the word and phrase level during both structured and informal/spontaeous speech tasks today. Pt sitting upright in bed with alarm set and needs within reach. Continue per current plan of care.          Pain Pain  Assessment Pain Scale: 0-10 Pain Score: 0-No pain  Therapy/Group: Individual Therapy  [name] 06/27/2020, 7:30 AM

## 2020-06-27 NOTE — Progress Notes (Signed)
[name] PHYSICAL MEDICINE & REHABILITATION PROGRESS NOTE   Subjective/Complaints:  No issues overnight   ROS: Denies CP, SOB, N/V/D  Objective:   No results found. No results for input(s): WBC, HGB, HCT, PLT in the last 72 hours. No results for input(s): NA, K, CL, CO2, GLUCOSE, BUN, CREATININE, CALCIUM in the last 72 hours.  Intake/Output Summary (Last 24 hours) at 06/27/2020 0821 Last data filed at 06/27/2020 0640 Gross per 24 hour  Intake 1210.21 ml  Output --  Net 1210.21 ml        Physical Exam: Vital Signs Blood pressure 128/80, pulse 81, temperature 98 F (36.7 C), temperature source Oral, resp. rate 18, height 6\' 2"  (1.88 m), weight 93.9 kg, SpO2 94 %.   General: No acute distress Mood and affect are appropriate Heart: Regular rate and rhythm no rubs murmurs or extra sounds Lungs: Clear to auscultation, breathing unlabored, no rales or wheezes Abdomen: Positive bowel sounds, soft nontender to palpation, nondistended Extremities: No clubbing, cyanosis, or edema Skin: No evidence of breakdown, no evidence of rash   Musc: Right hand edema.  No tenderness in extremities. Neuro: Alert Severe dysarthria, unchanged Motor: RUE/RLE: 0/5 proximal distal, unchanged.  Assessment/Plan: 1. Functional deficits which require 3+ hours per day of interdisciplinary therapy in a comprehensive inpatient rehab setting.  Physiatrist is providing close team supervision and 24 hour management of active medical problems listed below.  Physiatrist and rehab team continue to assess barriers to discharge/monitor patient progress toward functional and medical goals  Care Tool:  Bathing    Body parts bathed by patient: Chest,Abdomen,Face   Body parts bathed by helper: Right arm,Left arm,Front perineal area,Buttocks,Right upper leg,Left upper leg,Right lower leg,Left lower leg     Bathing assist Assist Level: Total Assistance - Patient < 25%     Upper Body  Dressing/Undressing Upper body dressing   What is the patient wearing?: Pull over shirt    Upper body assist Assist Level: Maximal Assistance - Patient 25 - 49%    Lower Body Dressing/Undressing Lower body dressing      What is the patient wearing?: Incontinence brief,Pants     Lower body assist Assist for lower body dressing: Total Assistance - Patient < 25%     Toileting Toileting    Toileting assist Assist for toileting: Total Assistance - Patient < 25%     Transfers Chair/bed transfer  Transfers assist  Chair/bed transfer activity did not occur: Safety/medical concerns        Locomotion Ambulation   Ambulation assist   Ambulation activity did not occur: Safety/medical concerns          Walk 10 feet activity   Assist  Walk 10 feet activity did not occur: Safety/medical concerns        Walk 50 feet activity   Assist Walk 50 feet with 2 turns activity did not occur: Safety/medical concerns         Walk 150 feet activity   Assist Walk 150 feet activity did not occur: Safety/medical concerns         Walk 10 feet on uneven surface  activity   Assist Walk 10 feet on uneven surfaces activity did not occur: Safety/medical concerns         Wheelchair     Assist Will patient use wheelchair at discharge?: Yes Type of Wheelchair: Manual (TBD) Wheelchair activity did not occur: Safety/medical concerns         Wheelchair 50 feet with 2 turns activity  Assist    Wheelchair 50 feet with 2 turns activity did not occur: Safety/medical concerns       Wheelchair 150 feet activity     Assist  Wheelchair 150 feet activity did not occur: Safety/medical concerns       Blood pressure 128/80, pulse 81, temperature 98 F (36.7 C), temperature source Oral, resp. rate 18, height 6\' 2"  (1.88 m), weight 93.9 kg, SpO2 94 %.    Medical Problem List and Plan: 1.  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  Antithrombotics: -DVT/anticoagulation: SCDs             -antiplatelet therapy: N/A 3. Pain Management: Continue Tylenol as needed.   Controlled with meds 1/2 4. Mood: Provide emotional support             -antipsychotic agents: N/A 5. Neuropsych: This patient is capable of making decisions on his own behalf. 6. Skin/Wound Care: Routine skin checks 7. Fluids/Electrolytes/Nutrition: Routine I/O's   BMP within acceptable range on 12/24, labs ordered for tomorrow 8.    Post stroke dysphagia.   NPO, core track clogged, removed. Cortrak team to reinsert today but will consult IR to get on schedule for PEG  9.  Atrial fibrillation.  Eliquis discontinued due to ICH.  Cardiac rate controlled.  Continue beta-blocker  Rate controlled on 1/2 10.  Hypertension.  Norvasc 10 mg daily. Increased Lopressor to 25mg  TID.   Lisinopril 2.5mg  on 12/27.   Monitor with increased mobility Vitals:   06/26/20 1938 06/27/20 0430  BP: (!) 144/82 128/80  Pulse: 85 81  Resp: 20 18  Temp: 98.2 F (36.8 C) 98 F (36.7 C)  SpO2: 94% 94%   Controlled on 1/3 11.  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  Contact precautions 12.  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  Hemoglobin A1c 6.1.  SSI. CBGs ranging from 118 to 159: monitor AC/HS. Provide dietary education.   Improving-anticipate continued improvement with lack of tube feeds 13.  GERD.  Continue Protonix 14. Hypomagnesemia: Started magnesium gluconate 250mg  daily  Magnesium 1.6 on 12/25, labs ordered for tomorrow  LOS: 11 days A FACE TO FACE EVALUATION WAS PERFORMED  08/25/20 06/27/2020, 8:21 AM

## 2020-06-28 ENCOUNTER — Inpatient Hospital Stay (HOSPITAL_COMMUNITY): Payer: Medicare Other | Admitting: Occupational Therapy

## 2020-06-28 ENCOUNTER — Inpatient Hospital Stay (HOSPITAL_COMMUNITY): Payer: Medicare Other

## 2020-06-28 LAB — GLUCOSE, CAPILLARY
Glucose-Capillary: 153 mg/dL — ABNORMAL HIGH (ref 70–99)
Glucose-Capillary: 166 mg/dL — ABNORMAL HIGH (ref 70–99)
Glucose-Capillary: 182 mg/dL — ABNORMAL HIGH (ref 70–99)
Glucose-Capillary: 148 mg/dL — ABNORMAL HIGH (ref 70–99)
Glucose-Capillary: 98 mg/dL (ref 70–99)

## 2020-06-28 MED ORDER — IOHEXOL 300 MG/ML  SOLN
75.0000 mL | Freq: Once | INTRAMUSCULAR | Status: AC | PRN
  Administered 2020-06-28: 75 mL via ORAL

## 2020-06-28 NOTE — Progress Notes (Addendum)
Physical Therapy Session Note  Patient Details  Name: [name] MRN: [id number] Date of Birth: [birth year]  Today's Date: 06/28/2020 PT Individual Time: 1415-1530 PT Individual Time Calculation (min): 75 min   Short Term Goals: Week 1:  PT Short Term Goal 1 (Week 1): Pt will maintain static sitting balance with min A x 5 min PT Short Term Goal 1 - Progress (Week 1): Met PT Short Term Goal 2 (Week 1): Pt will perform least restrictive transfer OOB with assist x 2 PT Short Term Goal 2 - Progress (Week 1): Met PT Short Term Goal 3 (Week 1): Pt will initiate w/c mobility PT Short Term Goal 3 - Progress (Week 1): Not met PT Short Term Goal 4 (Week 1): Pt will tolerate sitting OOB x 1 hour PT Short Term Goal 4 - Progress (Week 1): Met Week 2:  PT Short Term Goal 1 (Week 2): Pt will perform sit<>stand with max assist of 1. PT Short Term Goal 2 (Week 2): Pt will transfer to [facility] in stedy with +2 assis to force WB through RLE PT Short Term Goal 3 (Week 2): Pt will maintain sitting balacne EOB with supervision assist while engaged in dynamic reaching task Week 3:     Skilled Therapeutic Interventions/Progress Updates:    PAIN denies pain this pm.  Pt initially oob in wc and agreeable to treatment.  Dysarthric but answers appropriately during session.  Transported to gym for session w/focus on midline orientation/trunk control in standing, endurance.  Pt brought to standing in standing frame via mechanical lift.  In standing pt performed simple foam block puzzle w/cues for sequencing of blocks/overall mod assist.   Standing overhead reach w/LUE to promote trunk extension. Standing scapular retraction for back extensor strengthening Standing glut activation w/multimodal cues to promote upright posture  Repeated standing activity x 2, 32min x 1, 4 min x 1. W/frequent cues for upright posture, relies greatly on LUE propping for support due to poor endurance/trunk weakness. HR 72-88, 02sats  94-96%  Pt transported to room.  sliding board transfer wc to bed w/uphill bias w/total assist of 2, pt does attempt to scoot using wt shift and LUE but poorly coordinated and very weak. Static sit on edge of bed w/min assist. Sit to supine w/max assist.  Supine therex: Bridging x 6 Clamshells w/resistance to L to allow overflow activation of R x 5 Rolling L/R overall max assist Heel slides x 10 L Pt repositioned for comfort. Pt left supine w/rails up x 4, alarm set, bed in lowest position, and needs in reach.  Therapy Documentation Precautions:  Precautions Precautions: Fall Precaution Comments: R hemiplegia with subluxation Restrictions Weight Bearing Restrictions: No   Therapy/Group: Individual Therapy  Callie Fielding, [place] 06/28/2020, 3:20 PM

## 2020-06-28 NOTE — Progress Notes (Signed)
Discussed with infection prevention, approve to discontinue contact precautions. 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  Jay Schlichter, LPN

## 2020-06-28 NOTE — Progress Notes (Signed)
Occupational Therapy Session Note  Patient Details  Name: Mahmood Boehringer MRN: [id number] Date of Birth: 09/09/1940  Today's Date: 06/28/2020 OT Individual Time: 2353-6144 OT Individual Time Calculation (min): 72 min    Short Term Goals: Week 2:  OT Short Term Goal 1 (Week 2): Pt will don shirt with Mod A OT Short Term Goal 2 (Week 2): Pt will complete toilet transfer with LRAD and +2 assist to work on OOB toileting OT Short Term Goal 3 (Week 2): Pt will bathe UB with Mod A  Skilled Therapeutic Interventions/Progress Updates:    Pt received in bed sleeping soundly but awoke fairly easily.  Prior to +2 A arriving, PROM and edema massage to RUE. Edema is markedly decreased in arm from admission.  Trace movement noted in scapula but otherwise RUE flaccid. His brief was soaked with urine. Pt able to roll to R with min A needs max to L.  +2 A with process for ease of handling supplies, but pt was able to wash part of his LB. Had pt actively involved in process by working on LLE active lifting and bridging..  Washed UB with mod A and t shirt with max A. Pt has limited neck ROM forward and to the R and that makes donning a shirt more difficult.  Pt sat to EOB with max A and initially needed max A to maintain static sit progressing to min A for a few seconds at a time.  Slight progress in balance from admission. Applied arm sling for support.   To determine if pt is safe to sit on a BSC and use a stedy to [facility], practiced sitting with limited support and rising to stand from elevated bed.  Pt initially not initiating well and instead of hauling pt up with max of 2 in stedy, told pt to focus on pushing through his legs and focus on a visual target.  Explained to pt that he really has to put in his max effort.  With strong verbal cues and facilitation, pt able to lift hips with mod A of 2 but unable to lift torso so he stayed in a very flexed position.  Bed was elevated, in a lower position of a BSC it would  require more effort.  To continue working on upright posture, had pt try to move from pads of stedy to stand. Initially needed max of 2, progressing to mod.  Pt only able to hold legs straight for 3-4 seconds at a time and needed max A to lift chest/sternum up. Used visual targets as cues.  On the last attempt, talked to pt about really pushing himself.  Pt laughed at himself quite a bit.  He was then able to rise one last time with min of 2.   Positioned in wc with pillow supports, belt alarm on and tilted back in TIS chair.  Call light in reach.    Therapy Documentation Precautions:  Precautions Precautions: Fall Precaution Comments: R hemiplegia with subluxation Restrictions Weight Bearing Restrictions: No  Pain: Pain Assessment Pain Score: 0-No pain ADL: ADL Eating: Maximal assistance Grooming: Maximal assistance Upper Body Bathing: Moderate assistance Where Assessed-Upper Body Bathing: Bed level Lower Body Bathing: Moderate assistance Where Assessed-Lower Body Bathing: Bed level Upper Body Dressing: Maximal assistance Where Assessed-Upper Body Dressing: Bed level Lower Body Dressing: Dependent Where Assessed-Lower Body Dressing: Bed level Toileting: Not assessed Toilet Transfer: Not assessed  Therapy/Group: Individual Therapy  Tyrese Ficek 06/28/2020, 10:20 AM

## 2020-06-28 NOTE — Progress Notes (Signed)
[place] PHYSICAL MEDICINE & REHABILITATION PROGRESS NOTE   Subjective/Complaints:  Appreciate IR consult.  Discussed PEG placement in am with pt , he is already aware   ROS: Denies CP, SOB, N/V/D  Objective:   No results found. No results for input(s): WBC, HGB, HCT, PLT in the last 72 hours. Recent Labs    06/27/20 0656  NA 134*  K 4.5  CL 99  CO2 24  GLUCOSE 112*  BUN 18  CREATININE 0.98  CALCIUM 9.2    Intake/Output Summary (Last 24 hours) at 06/28/2020 0740 Last data filed at 06/27/2020 1519 Gross per 24 hour  Intake 648.7 ml  Output --  Net 648.7 ml        Physical Exam: Vital Signs Blood pressure 131/82, pulse 74, temperature 98 F (36.7 C), temperature source Oral, resp. rate (!) 23, height 6\' 2"  (1.88 m), weight 91.2 kg, SpO2 97 %.    General: No acute distress Mood and affect are appropriate Heart: Regular rate and rhythm no rubs murmurs or extra sounds Lungs: Clear to auscultation, breathing unlabored, no rales or wheezes Abdomen: Positive bowel sounds, soft nontender to palpation, nondistended Extremities: No clubbing, cyanosis, or edema Skin: No evidence of breakdown, no evidence of rash  Musc: Right hand edema.  No tenderness in extremities. Neuro: Alert Severe dysarthria, unchanged Motor: RUE/RLE: 0/5 proximal distal, Except trace R hip add and finger flexion but not able to perform consistently   Assessment/Plan: 1. Functional deficits which require 3+ hours per day of interdisciplinary therapy in a comprehensive inpatient rehab setting.  Physiatrist is providing close team supervision and 24 hour management of active medical problems listed below.  Physiatrist and rehab team continue to assess barriers to discharge/monitor patient progress toward functional and medical goals  Care Tool:  Bathing    Body parts bathed by patient: Chest,Abdomen,Face,Right upper leg,Left upper leg   Body parts bathed by helper: Buttocks,Front perineal  area,Right arm,Left arm,Right lower leg,Left lower leg     Bathing assist Assist Level: Maximal Assistance - Patient 24 - 49%     Upper Body Dressing/Undressing Upper body dressing   What is the patient wearing?: Hospital gown only    Upper body assist Assist Level: Maximal Assistance - Patient 25 - 49%    Lower Body Dressing/Undressing Lower body dressing      What is the patient wearing?: Incontinence brief     Lower body assist Assist for lower body dressing: 2 Helpers     Toileting Toileting    Toileting assist Assist for toileting: 2 Helpers     Transfers Chair/bed transfer  Transfers assist  Chair/bed transfer activity did not occur: Safety/medical concerns        Locomotion Ambulation   Ambulation assist   Ambulation activity did not occur: Safety/medical concerns          Walk 10 feet activity   Assist  Walk 10 feet activity did not occur: Safety/medical concerns        Walk 50 feet activity   Assist Walk 50 feet with 2 turns activity did not occur: Safety/medical concerns         Walk 150 feet activity   Assist Walk 150 feet activity did not occur: Safety/medical concerns         Walk 10 feet on uneven surface  activity   Assist Walk 10 feet on uneven surfaces activity did not occur: Safety/medical concerns         Wheelchair  Assist Will patient use wheelchair at discharge?: Yes Type of Wheelchair: Manual (TBD) Wheelchair activity did not occur: Safety/medical concerns         Wheelchair 50 feet with 2 turns activity    Assist    Wheelchair 50 feet with 2 turns activity did not occur: Safety/medical concerns       Wheelchair 150 feet activity     Assist  Wheelchair 150 feet activity did not occur: Safety/medical concerns       Blood pressure 131/82, pulse 74, temperature 98 F (36.7 C), temperature source Oral, resp. rate (!) 23, height 6\' 2"  (1.88 m), weight 91.2 kg, SpO2 97 %.     Medical Problem List and Plan: 1.  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  Antithrombotics: -DVT/anticoagulation: SCDs             -antiplatelet therapy: N/A 3. Pain Management: Continue Tylenol as needed.   Controlled with meds 1/2 4. Mood: Provide emotional support             -antipsychotic agents: N/A 5. Neuropsych: This patient is capable of making decisions on his own behalf. 6. Skin/Wound Care: Routine skin checks 7. Fluids/Electrolytes/Nutrition: Routine I/O's   BMP within acceptable range on 12/24, labs ordered for tomorrow 8.    Post stroke dysphagia.   NPO, core track clogged, removed. Cortrak team to reinsert today but will consult IR to get on schedule for PEG  9.  Atrial fibrillation.  Eliquis discontinued due to ICH.  Cardiac rate controlled.  Continue beta-blocker  Rate controlled on 1/2 10.  Hypertension.  Norvasc 10 mg daily. Increased Lopressor to 25mg  TID.   Lisinopril 2.5mg  on 12/27.   Monitor with increased mobility Vitals:   06/27/20 2001 06/28/20 0344  BP: 113/87 131/82  Pulse: 77 74  Resp: (!) 25 (!) 23  Temp: 98.5 F (36.9 C) 98 F (36.7 C)  SpO2: 97% 97%   Controlled on 1/4 11.  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  Contact precautions 12.  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  Hemoglobin A1c 6.1.  SSI. CBGs ranging from 118 to 159: monitor AC/HS. Provide dietary education.   Improving-anticipate continued improvement with lack of tube feeds 13.  GERD.  Continue Protonix 14. Hypomagnesemia: Started magnesium gluconate 250mg  daily  Magnesium 1.6 on 12/25, labs ordered for tomorrow  LOS: 12 days A FACE TO FACE EVALUATION WAS PERFORMED  08/26/20 06/28/2020, 7:40 AM

## 2020-06-28 NOTE — Progress Notes (Signed)
Small in point pus filled like bump to posterior left forearm. No pain to area. No complications noted at this time.  Jay Schlichter, LPN

## 2020-06-28 NOTE — Progress Notes (Signed)
5ml of contrast given via core track(NG tube) as ordered. No complications noted with given morning medications. 0.45NS running at 22ml/hr via IV to left hand. Osmolite 1.5 running via core track(NG tube) at 2ml/hr. No complications noted at this time.  Jay Schlichter, LPN

## 2020-06-28 NOTE — Progress Notes (Signed)
Physical Therapy Session Note  Patient Details  Name: [name] MRN: [id number] Date of Birth: September 16, 1940  Today's Date: 06/28/2020 PT Individual Time: 1115-1200 PT Individual Time Calculation (min): 45 min   Short Term Goals: Week 2:  PT Short Term Goal 1 (Week 2): Pt will perform sit<>stand with max assist of 1. PT Short Term Goal 2 (Week 2): Pt will transfer to [facility] in stedy with +2 assis to force WB through RLE PT Short Term Goal 3 (Week 2): Pt will maintain sitting balacne EOB with supervision assist while engaged in dynamic reaching task  Skilled Therapeutic Interventions/Progress Updates:    Patient received sitting up in TIS wc, agreeable to PT. He denies pain. PT propelling patient in wc to therapy gym for time management. Despite posterior tilt of wc, patient with significant anterior lean with obvious kyphosis. Patient reports that prior to hospitalization, his kyphosis was "not this bad." Patient requiring MaxA x2 to come to stand in [place] for transfer to therapy mat. He demonstrated very limited initiation. Patient maintained significantly forward flexed posture despite multimodal cues. Dynamic sitting balance edge of mat with up to ModA to maintain sitting posture. Patient able to reach across midline to retrieve object, but demonstrated very poor righting reactions if he reached too far outside his BOS. With this, he would require up to MaxA to correct posture back to midline. Patient requiring MaxAx2 to come to stand in [place] again. MaxA x2, also, for partial stands from [place] x3 trials. Physical assist required to facilitate upright trunk posture and prevent patients head from resting on Stedy bar. Stedy used to transfer back to [employer]. Patient returning to room in wc, seatbelt alarm on, call light within reach.   Therapy Documentation Precautions:  Precautions Precautions: Fall Precaution Comments: R hemiplegia with subluxation Restrictions Weight Bearing Restrictions:  No    Therapy/Group: Individual Therapy  Elizebeth Koller, PT, DPT, CBIS  06/28/2020, 7:42 AM

## 2020-06-29 ENCOUNTER — Inpatient Hospital Stay (HOSPITAL_COMMUNITY): Payer: Medicare Other

## 2020-06-29 ENCOUNTER — Inpatient Hospital Stay (HOSPITAL_COMMUNITY): Payer: Medicare Other | Admitting: *Deleted

## 2020-06-29 ENCOUNTER — Inpatient Hospital Stay (HOSPITAL_COMMUNITY): Payer: Medicare Other | Admitting: Physical Therapy

## 2020-06-29 ENCOUNTER — Inpatient Hospital Stay (HOSPITAL_COMMUNITY): Payer: Medicare Other | Admitting: Speech Pathology

## 2020-06-29 HISTORY — PX: IR GASTROSTOMY TUBE MOD SED: IMG625

## 2020-06-29 LAB — CBC
HCT: 47.3 % (ref 39.0–52.0)
Hemoglobin: 15.8 g/dL (ref 13.0–17.0)
MCH: 30 pg (ref 26.0–34.0)
MCHC: 33.4 g/dL (ref 30.0–36.0)
MCV: 89.8 fL (ref 80.0–100.0)
Platelets: 195 10*3/uL (ref 150–400)
RBC: 5.27 MIL/uL (ref 4.22–5.81)
RDW: 13.1 % (ref 11.5–15.5)
WBC: 8.2 10*3/uL (ref 4.0–10.5)
nRBC: 0 % (ref 0.0–0.2)

## 2020-06-29 LAB — GLUCOSE, CAPILLARY
Glucose-Capillary: 109 mg/dL — ABNORMAL HIGH (ref 70–99)
Glucose-Capillary: 118 mg/dL — ABNORMAL HIGH (ref 70–99)
Glucose-Capillary: 149 mg/dL — ABNORMAL HIGH (ref 70–99)
Glucose-Capillary: 155 mg/dL — ABNORMAL HIGH (ref 70–99)
Glucose-Capillary: 159 mg/dL — ABNORMAL HIGH (ref 70–99)
Glucose-Capillary: 163 mg/dL — ABNORMAL HIGH (ref 70–99)

## 2020-06-29 LAB — PROTIME-INR
INR: 1.1 (ref 0.8–1.2)
Prothrombin Time: 14.2 seconds (ref 11.4–15.2)

## 2020-06-29 MED ORDER — MAGNESIUM GLUCONATE 500 MG PO TABS
250.0000 mg | ORAL_TABLET | Freq: Every day | ORAL | Status: DC
  Administered 2020-06-30 – 2020-07-08 (×9): 250 mg
  Filled 2020-06-29 (×10): qty 1

## 2020-06-29 MED ORDER — LISINOPRIL 2.5 MG PO TABS
2.5000 mg | ORAL_TABLET | Freq: Every day | ORAL | Status: DC
  Administered 2020-06-30 – 2020-07-08 (×9): 2.5 mg
  Filled 2020-06-29 (×9): qty 1

## 2020-06-29 MED ORDER — IOHEXOL 300 MG/ML  SOLN
50.0000 mL | Freq: Once | INTRAMUSCULAR | Status: AC | PRN
  Administered 2020-06-29: 15 mL

## 2020-06-29 MED ORDER — MIDAZOLAM HCL 2 MG/2ML IJ SOLN
INTRAMUSCULAR | Status: AC | PRN
  Administered 2020-06-29: 0.5 mg via INTRAVENOUS

## 2020-06-29 MED ORDER — FENTANYL CITRATE (PF) 100 MCG/2ML IJ SOLN
INTRAMUSCULAR | Status: AC
  Filled 2020-06-29: qty 2

## 2020-06-29 MED ORDER — SENNOSIDES 8.8 MG/5ML PO SYRP
5.0000 mL | ORAL_SOLUTION | Freq: Two times a day (BID) | ORAL | Status: DC
  Administered 2020-06-30 – 2020-07-07 (×14): 5 mL
  Filled 2020-06-29 (×19): qty 5

## 2020-06-29 MED ORDER — MIDAZOLAM HCL 2 MG/2ML IJ SOLN
INTRAMUSCULAR | Status: AC
  Filled 2020-06-29: qty 2

## 2020-06-29 MED ORDER — LIDOCAINE HCL 1 % IJ SOLN
INTRAMUSCULAR | Status: AC
  Filled 2020-06-29: qty 20

## 2020-06-29 MED ORDER — FENTANYL CITRATE (PF) 100 MCG/2ML IJ SOLN
INTRAMUSCULAR | Status: AC | PRN
  Administered 2020-06-29: 50 ug via INTRAVENOUS

## 2020-06-29 MED ORDER — CEFAZOLIN SODIUM-DEXTROSE 2-4 GM/100ML-% IV SOLN
INTRAVENOUS | Status: AC
  Administered 2020-06-29: 2 g via INTRAVENOUS
  Filled 2020-06-29: qty 100

## 2020-06-29 MED ORDER — SODIUM CHLORIDE 0.9 % IV SOLN
INTRAVENOUS | Status: AC | PRN
  Administered 2020-06-29: 10 mL/h via INTRAVENOUS

## 2020-06-29 NOTE — Progress Notes (Signed)
Occupational Therapy Session Note  Patient Details  Name: [name] MRN: [id number] Date of Birth: December 19, 1940  Today's Date: 06/29/2020 OT Individual Time: 3300-7622 OT Individual Time Calculation (min): 24 min    Short Term Goals: Week 1:  OT Short Term Goal 1 (Week 1): Pt will be able to sit to EOB with mod A of 1. OT Short Term Goal 1 - Progress (Week 1): Progressing toward goal OT Short Term Goal 2 (Week 1): Pt will be able to maintain sitting balance EOB with mod A of 1. OT Short Term Goal 2 - Progress (Week 1): Met OT Short Term Goal 3 (Week 1): Pt will bathe UB with mod A. OT Short Term Goal 3 - Progress (Week 1): Progressing toward goal OT Short Term Goal 4 (Week 1): Pt will don shirt with mod A. OT Short Term Goal 4 - Progress (Week 1): Progressing toward goal OT Short Term Goal 5 (Week 1): Pt will be able to find items in his R visual field with min cues. OT Short Term Goal 5 - Progress (Week 1): Progressing toward goal  Skilled Therapeutic Interventions/Progress Updates:    Overall pt missed 21 min skilled OT d/t need to go down for PEG placement procedure. Pt received in w/c reporting no pain but dizziness all morning. Per PTA pt with double vision during fucntional activities. Pt completes SBT back to bed with +2 A to R with pt initiating scoot with L knee block and sling in place for LUE support. Pt supine in bed and reporting clock on wall-double. When pt tracking stimulus pt required stimulus to be 1 foot in front of pt for double images to merge. Exited session with pt seated in bed, exit alarm on and call light in reach   Therapy Documentation Precautions:  Precautions Precautions: Fall Precaution Comments: R hemiplegia with subluxation Restrictions Weight Bearing Restrictions: No General:   Vital Signs: Therapy Vitals Pulse Rate: 65 BP: 135/87 Pain: Pain Assessment Pain Scale: 0-10 Pain Score: 0-No pain ADL: ADL Eating: Maximal assistance Grooming:  Maximal assistance Upper Body Bathing: Moderate assistance Where Assessed-Upper Body Bathing: Bed level Lower Body Bathing: Moderate assistance Where Assessed-Lower Body Bathing: Bed level Upper Body Dressing: Maximal assistance Where Assessed-Upper Body Dressing: Bed level Lower Body Dressing: Dependent Where Assessed-Lower Body Dressing: Bed level Toileting: Not assessed Toilet Transfer: Not assessed Vision   Perception    Praxis   Exercises:   Other Treatments:     Therapy/Group: Individual Therapy  Tonny Branch 06/29/2020, 11:45 AM

## 2020-06-29 NOTE — Sedation Documentation (Signed)
Attempt made x 1 to call report to 4W21 care RN. Care RN not available to receive report at this time.

## 2020-06-29 NOTE — Progress Notes (Signed)
Patient ID: [name], male   DOB: 08/19/1940, 80 y.o.   MRN: [id number] Team Conference Report to Patient/Family  Team Conference discussion was reviewed with the patient and caregiver, including goals, any changes in plan of care and target discharge date.  Patient and caregiver express understanding and are in agreement.  The patient has a target discharge date of  (SNF pending).  Andria Rhein 06/29/2020, 2:12 PM

## 2020-06-29 NOTE — Procedures (Signed)
Interventional Radiology Procedure Note  Procedure: Placement of percutaneous 20F pull-through gastrostomy tube. Complications: None Recommendations: - NPO except for sips and chips remainder of today and overnight - Maintain G-tube to LWS until tomorrow morning  - May advance diet as tolerated and begin using tube tomorrow morning  Signed,   Neftaly Inzunza S. Trentyn Boisclair, DO   

## 2020-06-29 NOTE — Patient Care Conference (Signed)
Inpatient RehabilitationTeam Conference and Plan of Care Update Date: 06/29/2020   Time: 10:19 AM    Patient Name: [name]      Medical Record Number: [id number]  Date of Birth: 1941-05-11 Sex: Male         Room/Bed: 4W21C/4W21C-01 Payor Info: Payor: MEDICARE / Plan: MEDICARE PART A AND B / Product Type: *No Product type* /    Admit Date/Time:  06/16/2020  4:24 PM  Primary Diagnosis:  ICH (intracerebral hemorrhage) [facility])  Hospital Problems: Principal Problem:   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 Active Problems:   Hypomagnesemia   Prediabetes   Essential hypertension   Dysphagia, post-stroke   Right hemiplegia [place])    Expected Discharge Date: Expected Discharge Date:  (SNF pending)  Team Members Present: Physician leading conference: Dr. Claudette Laws Care Coodinator Present: Lavera Guise, BSW;Alwyn Cordner, RN, BSN, CRRN Nurse Present: Other (comment) Freddrick March, RN) PT Present: Harless Litten, PTA OT Present: Other (comment) Annye English, OT) SLP Present: Suzzette Righter, CF-SLP PPS Coordinator present : Edson Snowball, [place] Breed, SLP     Current Status/Progress Goal Weekly Team Focus  Bowel/Bladder   Pt is incont x3, LBM 06/28/20  Regular BM every 3 days or less  Q2h toileting/ PRN   Swallow/Nutrition/ Hydration   NPO, Cortrak, demonstrating some improvements in oral manipulation and decreasing overt s/sx aspiration during PO trials  Supervision  PO trials, repeat MBS to determine least restrictive PO diet VS need for PEG   ADL's   bathing from bed - mod A, UB dressing max, LB dressing total, static sit balance min A for a few seconds then mod A.  +2 max A in stedy from elevated bed to wc. not ready for [facility] transfers. Improved edema in RUE, continues to be flaccid with trace activity in scapula.  min A goals - lofty but based on his discharge situation  ADL training, pt/family eduation, RUE NMR, postural control    Mobility   MaxA bed mobility, maxA x 2 SB transfer, St[place] transfer, static sitting balance CGA, dynamic sitting balance up to modA,  minA w/c level  sitting balance, R NMR, posture, initiating w/c mobiltity   Communication   Mod-severe dysarthria, Mod A  Supervision  speech intelligibility compensatory strategies   Safety/Cognition/ Behavioral Observations  Min A  Supervision  memory strategies, attention to functional tasks   Pain   Pt denies pain at this time  FACES scale 0/10  Assess pain qshift/prn   Skin   skin intact  no new breakdown  Assess skin qshift/prn     Discharge Planning:  Discharge to SNF   Team Discussion: PEG placement on schedule for today. More alert today and cognitively patient is good except for dysarthria. Incontinent of bowel and bladder. Note improved sitting balance with dynamic motion challenges although reports double vision and dizziness with activity.   Patient on target to meet rehab goals: Currently requires stedy for transfers and is not ready to try transfers to a [facility]. Right UE flaccid  *See Care Plan and progress notes for long and short-term goals.   Revisions to Treatment Plan:  Downgraded goals with PT from Min assist wheelchair level Focus with SLP on single word responses, memory issues,  trials of ice chips and encouraged to over-articulate when speaking. Teaching Needs: Nutritional means, medications, transfers, toileting, etc.  Current Barriers to Discharge: Decreased caregiver support, Home enviroment access/layout, Incontinence and Nutritional means  Possible Resolutions to Barriers: SNF placement recommended  once PEG in and tolerating TF PEG placement for nutritional needs     Medical Summary Current Status: severe dysphagia getting PEG today , lethargy improving  Barriers to Discharge: Nutrition means;Medical stability   Possible Resolutions to [employer] Focus: PEG today, will initiate feeds as per IR recs, ? tomorrow  , plan is for SNF when stable   Continued Need for Acute Rehabilitation Level of Care: The patient requires daily medical management by a physician with specialized training in physical medicine and rehabilitation for the following reasons: Direction of a multidisciplinary physical rehabilitation program to maximize functional independence : Yes Medical management of patient stability for increased activity during participation in an intensive rehabilitation regime.: Yes Analysis of laboratory values and/or radiology reports with any subsequent need for medication adjustment and/or medical intervention. : Yes   I attest that I was present, lead the team conference, and concur with the assessment and plan of the team.   Chana Bode B 06/29/2020, 1:26 PM

## 2020-06-29 NOTE — Progress Notes (Signed)
Physical Therapy Session Note  Patient Details  Name: [name] MRN: [id number] Date of Birth: 1940-07-14  Today's Date: 06/29/2020 PT Individual Time: 0900-1000 PT Individual Time Calculation (min): 60 min   Short Term Goals: Week 2:  PT Short Term Goal 1 (Week 2): Pt will perform sit<>stand with max assist of 1. PT Short Term Goal 2 (Week 2): Pt will transfer to [facility] in stedy with +2 assis to force WB through RLE PT Short Term Goal 3 (Week 2): Pt will maintain sitting balacne EOB with supervision assist while engaged in dynamic reaching task  Skilled Therapeutic Interventions/Progress Updates: Tx1: Pt presented in agreeable to therapy. Pt denies pain during session however does c/o dizziness during session, vitals WNL. Pt performed rolling L/R requiring modA to R and total A to L to don pants with PTA threading pants total A but pt able to initiate lifting RLE to allow PTA to thread pants. Pt performed supine to sit with maxA x 1. Pt set up for SB transfer and performed SB to TIS maxA x 2. Pt then transported to rehab gym and performed SB transfer to mat in same manner as prior. Standing activities deferred due to pt expressing dizziness and double vision. Pt participated in sitting balance activities including reaching for horseshoes and attempting to throw to target. Pt was able to maintain minA with moderate forward and L reaches with LUE as well as reaching to R with min challenges. Pt required more mod nearing maxA reaching to R with moderate challenges due to absent righting reactions on R. Pt also performed picking up horseshoes from mat on R and placing on L with PTA facilitating rotation. Pt required minA overall but no LOB. Pt transferred back to TIS in same manner as prior and transported back to room. Pt agreeable to remain in TIS until OT session (11:15). Pt left in TIS with belt alarm on, call bell within reach and dgt present.   Tx2: Pt missed 30 min skilled PT due to pt having PEG  placement. Will continue efforts as schedule permits.      Therapy Documentation Precautions:  Precautions Precautions: Fall Precaution Comments: R hemiplegia with subluxation Restrictions Weight Bearing Restrictions: No General: PT Amount of Missed Time (min): 30 Minutes PT Missed Treatment Reason: Unavailable (Comment);Other (Comment) (PEG placement) Vital Signs: Therapy Vitals Pulse Rate: 65 BP: 135/87 Therapy/Group: Individual Therapy  Nollie Shiflett  Darrielle Pflieger, PTA  06/29/2020, 11:10 AM

## 2020-06-29 NOTE — Progress Notes (Signed)
Nutrition Follow-up  RD working remotely.  DOCUMENTATION CODES:   Not applicable  INTERVENTION:   Resume continuous tube feeds via PEG: - Osmolite 1.5 @ 70 ml/hr (tube feeding can be held for up to 4 hours for therapies) - ProSource TF 45 ml TID - Free water flushes of 150 ml q 4 hours  Tube feeding regimen provides 2220 kcal, 121 grams of protein, and 1067 ml of H2O.  Total free water with flushes: 1967 ml   Once tolerance of continuous tube feeds via PEG is established, recommend transitioning to bolus tube feeding regimen: - Goal of 355 ml (1.5 cartons/ARCs) Osmolite 1.5 cal formula QID (total of 6 cartons/ARCs daily) - ProSource TF 45 ml TID - Free water flushes of 150 ml q 4 hours  Bolus tube feeding regimen at goal will provide 2253 kcal, 122 grams of protein, and 1084 ml of H2O.   Total free water with flushes: 1994 ml  NUTRITION DIAGNOSIS:   Inadequate oral intake related to inability to eat,dysphagia as evidenced by NPO status.  Ongoing  GOAL:   Patient will meet greater than or equal to 90% of their needs  Met via TF  MONITOR:   Diet advancement,Labs,Weight trends,TF tolerance,I & O's  REASON FOR ASSESSMENT:   Consult Enteral/tube feeding initiation and management  ASSESSMENT:   [name] is a 80 year old right-handed male with history of atrial fibrillation maintained on Eliquis, AAA status post stent graft 2006, CAD status post stenting, hypertension hyperlipidemia and OSA.  12/24 - Cortrak placed, tip gastric 01/03 - Cortrak clogged and removed 01/04 - Cortrak replaced, tip gastric 01/05 - PEG tube placed  Pt remains NPO. PEG tube placed yesterday and able to be used this morning per IR note. Cortrak to be removed today. Per MD note, resume continuous tube feeds today and transition to bolus tube feeds tomorrow. Discussed with RN.  CIR admit weight: 93.8 kg Current weight: 91 kg  Weight slowly trending down. Will continue to monitor and  adjust TF regimen as appropriate.  Current TF orders: Osmolite 1.5 @ 70 ml/hr x 20 hours, ProSource TF 45 ml TID, free water flushes 150 ml q 4 hours  Medications reviewed and include: SSI q 4 hours, magnesium gluconate, protonix, senokot  Labs reviewed: sodium 134, magnesium 1.6 CBG's: 137-163 x 24 hours  Diet Order:   Diet Order            Diet NPO time specified  Diet effective now                 EDUCATION NEEDS:   Education needs have been addressed  Skin:  Skin Assessment: Reviewed RN Assessment  Last BM:  06/29/20 type 5  Height:   Ht Readings from Last 1 Encounters:  06/16/20 _0  (1.88 m)    Weight:   Wt Readings from Last 1 Encounters:  06/30/20 91 kg    Ideal Body Weight:  86.4 kg  BMI:  Body mass index is 25.76 kg/m.  Estimated Nutritional Needs:   Kcal:  2150-2350  Protein:  115-130 grams  Fluid:  > 2 L    Gustavus Bryant, MS, RD, LDN Inpatient Clinical Dietitian Please see AMi[facility] for contact information.

## 2020-06-29 NOTE — Progress Notes (Signed)
Pt returned to 4W unit from getting PEG procedure, alert and oriented at baseline. Pt in no distress, breathing even and unlabored, no SOB noted. No bleeding noted to surgical site. Vitals taken and WNL. Cortrak continues to be in place.

## 2020-06-29 NOTE — Progress Notes (Signed)
[place] PHYSICAL MEDICINE & REHABILITATION PROGRESS NOTE   Subjective/Complaints:  Scheduled for PEG, CBC normal  Pt aware that PEG is being placed , pt is oriented , can give consent   ROS: Denies CP, SOB, N/V/D  Objective:   No results found. Recent Labs    06/29/20 0525  WBC 8.2  HGB 15.8  HCT 47.3  PLT 195   Recent Labs    06/27/20 0656  NA 134*  K 4.5  CL 99  CO2 24  GLUCOSE 112*  BUN 18  CREATININE 0.98  CALCIUM 9.2    Intake/Output Summary (Last 24 hours) at 06/29/2020 0840 Last data filed at 06/28/2020 2050 Gross per 24 hour  Intake 1345.97 ml  Output --  Net 1345.97 ml        Physical Exam: Vital Signs Blood pressure 128/81, pulse 77, temperature 97.8 F (36.6 C), resp. rate 19, height _0  (1.88 m), weight 93 kg, SpO2 92 %.    General: No acute distress Mood and affect are appropriate Heart: Regular rate and rhythm no rubs murmurs or extra sounds Lungs: Clear to auscultation, breathing unlabored, no rales or wheezes Abdomen: Positive bowel sounds, soft nontender to palpation, nondistended Extremities: No clubbing, cyanosis, or edema Skin: No evidence of breakdown, no evidence of rash  Musc: Right hand edema.  No tenderness in extremities. Neuro: Alert Severe dysarthria, unchanged Motor: RUE/RLE: 0/5 proximal distal, Except trace R hip add and finger flexion but not able to perform consistently   Assessment/Plan: 1. Functional deficits which require 3+ hours per day of interdisciplinary therapy in a comprehensive inpatient rehab setting.  Physiatrist is providing close team supervision and 24 hour management of active medical problems listed below.  Physiatrist and rehab team continue to assess barriers to discharge/monitor patient progress toward functional and medical goals  Care Tool:  Bathing    Body parts bathed by patient: Chest,Abdomen,Face,Right upper leg,Left upper leg,Front perineal area   Body parts bathed by helper:  Right arm,Left arm,Buttocks,Right lower leg,Left lower leg     Bathing assist Assist Level: Moderate Assistance - Patient 50 - 74%     Upper Body Dressing/Undressing Upper body dressing   What is the patient wearing?: Pull over shirt    Upper body assist Assist Level: Maximal Assistance - Patient 25 - 49%    Lower Body Dressing/Undressing Lower body dressing      What is the patient wearing?: Incontinence brief,Pants     Lower body assist Assist for lower body dressing: 2 Helpers     Toileting Toileting    Toileting assist Assist for toileting: 2 Helpers     Transfers Chair/bed transfer  Transfers assist  Chair/bed transfer activity did not occur: Safety/medical concerns  Chair/bed transfer assist level: Total Assistance - Patient < 25%     Locomotion Ambulation   Ambulation assist   Ambulation activity did not occur: Safety/medical concerns          Walk 10 feet activity   Assist  Walk 10 feet activity did not occur: Safety/medical concerns        Walk 50 feet activity   Assist Walk 50 feet with 2 turns activity did not occur: Safety/medical concerns         Walk 150 feet activity   Assist Walk 150 feet activity did not occur: Safety/medical concerns         Walk 10 feet on uneven surface  activity   Assist Walk 10 feet on uneven surfaces activity  did not occur: Safety/medical concerns         Wheelchair     Assist Will patient use wheelchair at discharge?: Yes Type of Wheelchair: Manual (TBD) Wheelchair activity did not occur: Safety/medical concerns         Wheelchair 50 feet with 2 turns activity    Assist    Wheelchair 50 feet with 2 turns activity did not occur: Safety/medical concerns       Wheelchair 150 feet activity     Assist  Wheelchair 150 feet activity did not occur: Safety/medical concerns       Blood pressure 128/81, pulse 77, temperature 97.8 F (36.6 C), resp. rate 19, height  _0  (1.88 m), weight 93 kg, SpO2 92 %.    Medical Problem List and Plan: 1.  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 Formulized individual treatment plan based on medical history, underlying problem and comorbidities.   WHO/PRAFO ordered 2.  Antithrombotics: -DVT/anticoagulation: SCDs             -antiplatelet therapy: N/A 3. Pain Management: Continue Tylenol as needed.   Controlled with meds 1/2 4. Mood: Provide emotional support             -antipsychotic agents: N/A 5. Neuropsych: This patient is capable of making decisions on his own behalf. 6. Skin/Wound Care: Routine skin checks 7. Fluids/Electrolytes/Nutrition: Routine I/O's   BMP within acceptable range on 12/24, labs ordered for tomorrow 8.    Post stroke dysphagia.   NPO, core track clogged, removed. [facility] team to reinsert today but will consult IR to get on schedule for PEG  9.  Atrial fibrillation.  Eliquis discontinued due to [place].  Cardiac rate controlled.  Continue beta-blocker  Rate controlled on 1/2 10.  Hypertension.  Norvasc 10 mg daily. Increased Lopressor to 16m TID.   Lisinopril 2.569mon 12/27.   Monitor with increased mobility Vitals:   06/28/20 1959 06/29/20 0351  BP: 131/75 128/81  Pulse: 60 77  Resp: (!) 24 19  Temp: 98.1 F (36.7 C) 97.8 F (36.6 C)  SpO2: 95% 92%   Controlled on 1/4 11.  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  Contact precautions 12.  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  Hemoglobin A1c 6.1.  SSI. CBGs ranging from 118 to 159: monitor AC/HS. Provide dietary education.   Improving-anticipate continued improvement with lack of tube  feeds 13.  GERD.  Continue Protonix 14. Hypomagnesemia: Started magnesium gluconate 25011maily  Magnesium 1.6 on 12/25, labs ordered for tomorrow  LOS: 13 days A FACE TO FACLeisure WorldKirsteins 06/29/2020, 8:40 AM

## 2020-06-29 NOTE — Progress Notes (Signed)
Speech Language Pathology Daily Session Note  Patient Details  Name: [name] MRN: [id number] Date of Birth: 27-Oct-1940  Today's Date: 06/29/2020 SLP Individual Time: 0730-0825 SLP Individual Time Calculation (min): 55 min  Short Term Goals: Week 2: SLP Short Term Goal 1 (Week 2): Pt will participate in PO trials with improvements in ability orally manipulate bolus and reduction in overt s/sx aspriation to demosntrate readiness for repeat MBSS to determine least restrictive PO diet. SLP Short Term Goal 2 (Week 2): Pt will utilize compensatory strategies to increase speech intelligibility to ~75% at the word and phrase level with Mod A cues. SLP Short Term Goal 3 (Week 2): Pt will demonstrate recall of novel and daily information with mod A verbal cues. SLP Short Term Goal 4 (Week 2): Pt will demonstrate sustained attention and alertness in 20 minute intervals with min A verbal cues for redirection.  Skilled Therapeutic Interventions: Pt was seen for skilled ST targeting swallowing and cognitive-linguistic goals. Pt currently NPO with plans for PEG placement today, per MD order, therefore, no PO trials attempted. Pt's lips were very dry and cracked and some dried secretions were beginning to adhere to pt's hard palate. Therefore, oral care via suction brush provided, and lip moisturizer applied. SLP also taught pt Masako swallowing exercise to target BOT retraction and pharyngeal strengthening. However, despite Max A demonstrational and verbal cues, he was only able to return demonstration X1, due to difficulty triggering dry swallow. SLP also provided opportunities for pt to recall new/daily information and use note taking as compensatory memory strategy when recalling current medications. Pt with ~60% accuracy in recall of current medications, but Min A required for awareness of some of the changes and creating a list for future recall. Pt used pacing strategy and overaticulation of final  consonants to increase intelligibility with Min-Mod verbal cues from clinician. He was ~75% intelligible at the word and phrase level today. Pt left sitting in bed with alarm set and needs within reach, RN present. Continue per current plan of care.      Pain Pain Assessment Pain Scale: 0-10 Pain Score: 0-No pain  Therapy/Group: Individual Therapy  [name] 06/29/2020, 7:20 AM

## 2020-06-29 NOTE — Progress Notes (Signed)
Speech Language Pathology Weekly Progress and Session Note  Patient Details  Name: [name] MRN: [id number] Date of Birth: [birth year]  Beginning of progress report period: June 23, 2020 End of progress report period: June 30, 2020  Today's Date: 06/30/2020 SLP Individual Time: 0923-3007 SLP Individual Time Calculation (min): 42 min  Short Term Goals: Week 2: SLP Short Term Goal 1 (Week 2): Pt will participate in PO trials with improvements in ability orally manipulate bolus and reduction in overt s/sx aspriation to demosntrate readiness for repeat MBSS to determine least restrictive PO diet. SLP Short Term Goal 1 - Progress (Week 2): Progressing toward goal SLP Short Term Goal 2 (Week 2): Pt will utilize compensatory strategies to increase speech intelligibility to ~75% at the word and phrase level with Mod A cues. SLP Short Term Goal 2 - Progress (Week 2): Met SLP Short Term Goal 3 (Week 2): Pt will demonstrate recall of novel and daily information with mod A verbal cues. SLP Short Term Goal 3 - Progress (Week 2): Met SLP Short Term Goal 4 (Week 2): Pt will demonstrate sustained attention and alertness in 20 minute intervals with min A verbal cues for redirection. SLP Short Term Goal 4 - Progress (Week 2): Met    New Short Term Goals: Week 3: SLP Short Term Goal 1 (Week 3): Pt will participate in MBSS to reassess oropharyngeal swallow function and potential for initiation of PO diet and advise future ST dysphaiga interventions. SLP Short Term Goal 2 (Week 3): Pt will utilize compensatory strategies to increase speech intelligibility to ~80% at the word and phrase level with Min A cues. SLP Short Term Goal 3 (Week 3): Pt will demonstrate recall and carrupver of novel and daily information or therapy techniques with Supervision A verbal cues. SLP Short Term Goal 4 (Week 3): Pt will sustain attention to functional tasks for 20 minute intervals with Supervision A cues for  redirection.  Weekly Progress Updates: Pt has made functional gains and met 3 out of 4 short term goals. Pt is currently Mod assist for use of compensatory strategies for speech intelligibility due to severe dysarthria. He can be ~75-80% intelligible at the phrase level when utilizing strategies, however when more fatigued intelligibility and ability to self-monitor use of strategies declines. Pt is still NPO and had PEG placement 06/29/20 due to severe oropharyngeal dysphagia. However, he is demonstrating slight improvements in ability to orally manipulate boluses and decreasing overt s/sx aspiration during PO trials with SLP. His ability to maintain arousal is also improving. Anticipate pt will be ready for repeat MBSS prior to his d/c to SNF. He is also requiring Min A for recall and carryover of new information at times, however attention is much improved when fully alert. Pt education is ongoing; no family has been present for ST sessions outside of evaluation day. Pt would continue to benefit from skilled ST while inpatient in order to maximize functional independence and reduce burden of care prior to discharge. Anticipate that pt will need 24/7 supervision at discharge in addition to [place] follow up at next level of care - SNF is recommended.     Intensity: Minumum of 1-2 x/day, 30 to 90 minutes Frequency: 3 to 5 out of 7 days Duration/Length of Stay: 07/13/19 - potentially earlier, pending SNF Treatment/Interventions: Dysphagia/aspiration precaution training;Patient/family education;Therapeutic Activities;Cueing hierarchy;Functional tasks;Speech/Language facilitation;Cognitive remediation/compensation   Daily Session  Skilled Therapeutic Interventions: Pt was seen for skilled ST targeting dysphagia and speech goals. Pt's wife present and receptive  to education throughout. After oral care via suction toothbrush, pt accepted a total of ~15 ice chips with 1 cough response. He also accepted 10 tsp of  applesauce without overt s/sx aspiration. Oral transit of boluses was mildly prolonged, but transit time and overall ability to orally manipulate boluses (ice and puree) is steadily improving. When this presentation remains consistent and he maintains tolerance from a medical standpoint, pt will be ready for repeat MBSS to instrumentally reassess oropharyngeal function. Recommend continue NPO for now with PEG used as primary source of nutrition and medication administration. Pt's speech intelligibility is also noted to be steadily increasing. He was 85% intelligible at the phrase level today, with much better use of slow rate and overarticulation during phrase level conversational tasks with SLP and when communicating preferences. Overall Min A cues provided for cues of compensatory speech strategies today. Pt left sitting upright in bed with alarm set and needs within reach, wife still present. Continue per current plan of care.     Pain Pain Assessment Pain Scale: 0-10 Pain Score: 0-No pain  Therapy/Group: Individual Therapy  Arbutus Leas 06/30/2020, 7:21 AM

## 2020-06-30 ENCOUNTER — Inpatient Hospital Stay (HOSPITAL_COMMUNITY): Payer: Medicare Other | Admitting: Physical Therapy

## 2020-06-30 ENCOUNTER — Inpatient Hospital Stay (HOSPITAL_COMMUNITY): Payer: Medicare Other | Admitting: Occupational Therapy

## 2020-06-30 ENCOUNTER — Inpatient Hospital Stay (HOSPITAL_COMMUNITY): Payer: Medicare Other | Admitting: Speech Pathology

## 2020-06-30 LAB — GLUCOSE, CAPILLARY
Glucose-Capillary: 105 mg/dL — ABNORMAL HIGH (ref 70–99)
Glucose-Capillary: 133 mg/dL — ABNORMAL HIGH (ref 70–99)
Glucose-Capillary: 137 mg/dL — ABNORMAL HIGH (ref 70–99)
Glucose-Capillary: 145 mg/dL — ABNORMAL HIGH (ref 70–99)
Glucose-Capillary: 154 mg/dL — ABNORMAL HIGH (ref 70–99)
Glucose-Capillary: 155 mg/dL — ABNORMAL HIGH (ref 70–99)

## 2020-06-30 MED ORDER — STUDY - ASPIRE - ASPIRIN 81 MG OR PLACEBO TABLET (PI-SETHI)
81.0000 mg | ORAL_TABLET | Freq: Every day | ORAL | Status: DC
  Administered 2020-06-30 – 2020-07-01 (×2): 81 mg via ORAL
  Filled 2020-06-30 (×2): qty 1

## 2020-06-30 MED ORDER — STUDY - ASPIRE - APIXABAN 5 MG OR PLACEBO TABLET (PI-SETHI)
5.0000 mg | ORAL_TABLET | Freq: Two times a day (BID) | ORAL | Status: DC
  Administered 2020-06-30 – 2020-07-01 (×2): 5 mg via ORAL
  Filled 2020-06-30 (×2): qty 1

## 2020-06-30 NOTE — Progress Notes (Signed)
IR.   History of dysphagia s/p percutaneous gastrostomy tube placement in IR 06/30/2019.   Patient awake and alert sitting in wheelchair with no complaints at this time. Wife at bedside. Gastrostomy tube site with minimal amounts of dried blood under bumper, no active bleeding, erythema, or drainage noted.   Gastrostomy tube stable- tube is ready for use, please ensure bumper is cinched to skin to prevent leakage. Further plans per PMR- appreciate and agree with management. Please call IR with questions/concerns.   Waylan Boga Sreekar Broyhill, PA-C 06/30/2020, 11:14 AM

## 2020-06-30 NOTE — Plan of Care (Signed)
  Problem: Consults Goal: RH STROKE PATIENT EDUCATION Description: See Patient Education module for education specifics  Outcome: Progressing Goal: Nutrition Consult-if indicated Outcome: Progressing   Problem: RH BOWEL ELIMINATION Goal: RH STG MANAGE BOWEL WITH ASSISTANCE Description: STG Manage Bowel with min Assistance. Outcome: Progressing Goal: RH STG MANAGE BOWEL W/MEDICATION W/ASSISTANCE Description: STG Manage Bowel with Medication with min Assistance. Outcome: Progressing   Problem: RH BLADDER ELIMINATION Goal: RH STG MANAGE BLADDER WITH ASSISTANCE Description: STG Manage Bladder With min Assistance Outcome: Progressing   Problem: RH SKIN INTEGRITY Goal: RH STG SKIN FREE OF INFECTION/BREAKDOWN Description: Patient will not have any new skin issues while on CIR Outcome: Progressing Goal: RH STG MAINTAIN SKIN INTEGRITY WITH ASSISTANCE Description: STG Maintain Skin Integrity With min Assistance. Outcome: Progressing   Problem: RH SAFETY Goal: RH STG ADHERE TO SAFETY PRECAUTIONS W/ASSISTANCE/DEVICE Description: STG Adhere to Safety Precautions With cues and reminders Outcome: Progressing   Problem: RH PAIN MANAGEMENT Goal: RH STG PAIN MANAGED AT OR BELOW PT'S PAIN GOAL Description: Remain free of pain  Outcome: Progressing   Problem: RH KNOWLEDGE DEFICIT Goal: RH STG INCREASE KNOWLEDGE OF HYPERTENSION Description: Patient will have knowledge of normal blood pressure parameters Outcome: Progressing Goal: RH STG INCREASE KNOWLEDGE OF DYSPHAGIA/FLUID INTAKE Description: Patient will verbalize understanding of diet modifications Outcome: Progressing Goal: RH STG INCREASE KNOWLEGDE OF HYPERLIPIDEMIA Description: Patient will be able to state medications & dietary modifications for treating Hyperlipidemia Outcome: Progressing Goal: RH STG INCREASE KNOWLEDGE OF STROKE PROPHYLAXIS Description: Patient will verbalize understanding of stroke prophylaxis medications &  measures Outcome: Progressing

## 2020-06-30 NOTE — Progress Notes (Signed)
[place] PHYSICAL MEDICINE & REHABILITATION PROGRESS NOTE   Subjective/Complaints:  PEG placed , some soreness around PEG tube relieved by tylenol  ROS: Denies CP, SOB, N/V/D  Objective:   IR GASTROSTOMY TUBE MOD SED  Result Date: 06/29/2020 INDICATION: 80 year old male referred for gastrostomy EXAM: PERC PLACEMENT GASTROSTOMY MEDICATIONS: Ancef 2 gm IV; Antibiotics were administered within 1 hour of the procedure. ANESTHESIA/SEDATION: Versed 0.5 mg IV; Fentanyl 50 mcg IV Moderate Sedation Time: 10 minutes The patient was continuously monitored during the procedure by the interventional radiology nurse under my direct supervision. CONTRAST:  32mL OMNIPAQUE IOHEXOL 300 MG/ML SOLN - administered into the gastric lumen. FLUOROSCOPY TIME:  Fluoroscopy Time: 2 minutes 24 seconds (22 mGy). COMPLICATIONS: None PROCEDURE: Informed written consent was obtained from the patient and the patient's family after a thorough discussion of the procedural risks, benefits and alternatives. All questions were addressed. Maximal Sterile Barrier Technique was utilized including caps, mask, sterile gowns, sterile gloves, sterile drape, hand hygiene and skin antiseptic. A timeout was performed prior to the initiation of the procedure. The epigastrium was prepped with Betadine in a sterile fashion, and a sterile drape was applied covering the operative field. A sterile gown and sterile gloves were used for the procedure. A 5-French orogastric tube is placed under fluoroscopic guidance. Scout imaging of the abdomen confirms barium within the transverse colon. The stomach was distended with gas. Under fluoroscopic guidance, an 18 gauge needle was utilized to puncture the anterior wall of the body of the stomach. An Amplatz wire was advanced through the needle passing a T fastener into the lumen of the stomach. The T fastener was secured for gastropexy. A 9-French sheath was inserted. A snare was advanced through the 9-French  sheath. A Teena Dunk was advanced through the orogastric tube. It was snared then pulled out the oral cavity, pulling the snare, as well. The leading edge of the gastrostomy was attached to the snare. It was then pulled down the esophagus and out the percutaneous site. Tube secured in place. Contrast was injected. Patient tolerated the procedure well and remained hemodynamically stable throughout. No complications were encountered and no significant blood loss encountered. IMPRESSION: Status post fluoroscopic placed percutaneous gastrostomy tube, with 20 [place] pull-through. Signed, Yvone Neu. Loreta Ave, DO [facility] Radiology Electronically Signed   By: Gilmer Mor D.O.   On: 06/29/2020 14:43   Recent Labs    06/29/20 0525  WBC 8.2  HGB 15.8  HCT 47.3  PLT 195   No results for input(s): NA, K, CL, CO2, GLUCOSE, BUN, CREATININE, CALCIUM in the last 72 hours.  Intake/Output Summary (Last 24 hours) at 06/30/2020 0755 Last data filed at 06/29/2020 1500 Gross per 24 hour  Intake 200 ml  Output 0 ml  Net 200 ml        Physical Exam: Vital Signs Blood pressure 119/77, pulse 81, temperature (!) 97.5 F (36.4 C), temperature source Oral, resp. rate (!) 24, height 6\' 2"  (1.88 m), weight 91 kg, SpO2 94 %.    General: No acute distress Mood and affect are appropriate Heart: Regular rate and rhythm no rubs murmurs or extra sounds Lungs: Clear to auscultation, breathing unlabored, no rales or wheezes Abdomen: PEG site with pressure dressing, mild tenderness around tube, remainder of abd non tender  Extremities: No clubbing, cyanosis, or edema Skin: No evidence of breakdown, no evidence of rash  Musc: Right hand edema.  No tenderness in extremities. Neuro: Alert Severe dysarthria,  unchanged Motor: RUE/RLE: 0/5 proximal distal, Except trace R hip add and finger flexion but not able to perform consistently   Assessment/Plan: 1. Functional deficits  which require 3+ hours per day of interdisciplinary therapy in a comprehensive inpatient rehab setting.  Physiatrist is providing close team supervision and 24 hour management of active medical problems listed below.  Physiatrist and rehab team continue to assess barriers to discharge/monitor patient progress toward functional and medical goals  Care Tool:  Bathing    Body parts bathed by patient: Chest,Abdomen,Face,Right upper leg,Left upper leg,Front perineal area   Body parts bathed by helper: Right arm,Left arm,Buttocks,Right lower leg,Left lower leg     Bathing assist Assist Level: Moderate Assistance - Patient 50 - 74%     Upper Body Dressing/Undressing Upper body dressing   What is the patient wearing?: Pull over shirt    Upper body assist Assist Level: Maximal Assistance - Patient 25 - 49%    Lower Body Dressing/Undressing Lower body dressing      What is the patient wearing?: Incontinence brief,Pants     Lower body assist Assist for lower body dressing: 2 Helpers     Toileting Toileting    Toileting assist Assist for toileting: 2 Helpers     Transfers Chair/bed transfer  Transfers assist  Chair/bed transfer activity did not occur: Safety/medical concerns  Chair/bed transfer assist level: Total Assistance - Patient < 25%     Locomotion Ambulation   Ambulation assist   Ambulation activity did not occur: Safety/medical concerns          Walk 10 feet activity   Assist  Walk 10 feet activity did not occur: Safety/medical concerns        Walk 50 feet activity   Assist Walk 50 feet with 2 turns activity did not occur: Safety/medical concerns         Walk 150 feet activity   Assist Walk 150 feet activity did not occur: Safety/medical concerns         Walk 10 feet on uneven surface  activity   Assist Walk 10 feet on uneven surfaces activity did not occur: Safety/medical concerns         Wheelchair     Assist Will  patient use wheelchair at discharge?: Yes Type of Wheelchair: Manual (TBD) Wheelchair activity did not occur: Safety/medical concerns         Wheelchair 50 feet with 2 turns activity    Assist    Wheelchair 50 feet with 2 turns activity did not occur: Safety/medical concerns       Wheelchair 150 feet activity     Assist  Wheelchair 150 feet activity did not occur: Safety/medical concerns       Blood pressure 119/77, pulse 81, temperature (!) 97.5 F (36.4 C), temperature source Oral, resp. rate (!) 24, height 6\' 2"  (1.88 m), weight 91 kg, SpO2 94 %.    Medical Problem List and Plan: 1.  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  Antithrombotics: -DVT/anticoagulation: SCDs             -  antiplatelet therapy: N/A 3. Pain Management: Continue Tylenol as needed.   Controlled with meds 1/2 4. Mood: Provide emotional support             -antipsychotic agents: N/A 5. Neuropsych: This patient is capable of making decisions on his own behalf. 6. Skin/Wound Care: Routine skin checks 7. Fluids/Electrolytes/Nutrition: Routine I/O's   BMP within acceptable range on 12/24, labs ordered for tomorrow 8.    Post stroke dysphagia.   NPO,PEG feeds to start today  9.  Atrial fibrillation.  Eliquis discontinued due to ICH.  Cardiac rate controlled.  Continue beta-blocker  Rate controlled on 1/2 10.  Hypertension.  Norvasc 10 mg daily. Increased Lopressor to 25mg  TID.   Lisinopril 2.5mg  on 12/27.   Monitor with increased mobility Vitals:   06/29/20 2017 06/30/20 0356  BP: (!) 151/88 119/77   Pulse: 85 81  Resp: 15 (!) 24  Temp: 98.1 F (36.7 C) (!) 97.5 F (36.4 C)  SpO2: 96% 94%   Controlled on 1/5 11.  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  Contact precautions 12.  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  Hemoglobin A1c 6.1.  SSI. CBGs ranging from 118 to 159: monitor AC/HS. Provide dietary education.   Improving-anticipate continued improvement with lack of tube feeds 13.  GERD.  Continue Protonix 14. Hypomagnesemia: Started magnesium gluconate 250mg  daily  Magnesium 1.6 on 12/25, labs ordered for tomorrow  LOS: 14 days A FACE TO FACE EVALUATION WAS PERFORMED  06/30/2020, 7:55 AM

## 2020-06-30 NOTE — Progress Notes (Signed)
Physical Therapy Session Note  Patient Details  Name: [name] MRN: [id number] Date of Birth: 09-10-1940  Today's Date: 06/30/2020 PT Individual Time: 0902-1000 PT Individual Time Calculation (min): 58 min   Short Term Goals: Week 2:  PT Short Term Goal 1 (Week 2): Pt will perform sit<>stand with max assist of 1. PT Short Term Goal 2 (Week 2): Pt will transfer to [facility] [place] with +2 assis to force WB through RLE PT Short Term Goal 3 (Week 2): Pt will maintain sitting balacne EOB with supervision assist while engaged in dynamic reaching task  Skilled Therapeutic Interventions/Progress Updates:   Pt received supine in bed and agreeable to PT. Supine>sit transfer with max assist through log roll to control BLE and trunk.  Sitting balance EOB while pull pants over Bil feet. Sit<>stand in [place]. X 3 from EOB to doff soiled brief. Perform peri care and pull pants to waist.  Pt transferred to [facility] in [place]. Pt reported to dizziness throughout and was able to maintain RUE on support pad with only supervision assist to prevent lateral LOB to the R.   Seated NMR to perform hip/knee flexion extension from WC. knietron reciprocal movement training 3 x 1.5 min with mod-max assist to improve ROM and attention to the RLE.   Patient returned to room and left sitting in [facility] with call bell in reach and all needs met.            Therapy Documentation Precautions:  Precautions Precautions: Fall Precaution Comments: R hemiplegia with subluxation Restrictions Weight Bearing Restrictions: No Pain: denies   Therapy/Group: Individual Therapy  Lorie Phenix 06/30/2020, 12:03 PM

## 2020-06-30 NOTE — Progress Notes (Signed)
Occupational Therapy Session Note  Patient Details  Name: Euclid Cassetta MRN: [id number] Date of Birth: October 08, 1940  Today's Date: 06/30/2020 OT Individual Time: 8182-9937 OT Individual Time Calculation (min): 55 min    Short Term Goals: Week 2:  OT Short Term Goal 1 (Week 2): Pt will don shirt with Mod A OT Short Term Goal 2 (Week 2): Pt will complete toilet transfer with LRAD and +2 assist to work on OOB toileting OT Short Term Goal 3 (Week 2): Pt will bathe UB with Mod A  Skilled Therapeutic Interventions/Progress Updates:    Pt up in the wheelchair for session.  Therapist took him down to dayroom and completed total +2 (pt 20%) sliding board transfer to the therapy mat.  He then worked on sitting balance on the mat.  Pt maintains flexed head and trunk with posterior pelvic tilt.  Therapist assisted with manual facilitation for anterior pelvic tilt and upright posture.  Decreased spinal mobility and cervical mobility noted with attempts to mobilize.  Transitioned to having him place BUEs on the bedside table in front of him with focus on forward trunk flexion for pushing the table forward while maintaining head up as much as possible.  He needed mod facilitation to complete.  Next, had him work on this with just the RUE, pushing the table forward and bringing it back with max assist overall.  Transitioned to placing the RUE in weightbearing beside of him on the mat and then having him reach across to a target with the LUE to increased trunk flexion and forced use of the RUE for balance.  Mod assist needed to regain balance and return to upright midline.  Returned to the wheelchair at end of session with total assist squat pivot transfer completed.  He was then taken back to the room where he transferred to the bed at the same level with total assist for sit to supine.  He was left with his spouse in the room and the call button and phone in reach.  Safety alarm was in place as well.    Therapy  Documentation Precautions:  Precautions Precautions: Fall Precaution Comments: R hemiplegia with subluxation Restrictions Weight Bearing Restrictions: No  Pain: Pain Assessment Pain Scale: Faces Pain Score: 0-No pain ADL: See Care Tool Section for some details of mobility and selfcare  Therapy/Group: Individual Therapy  Miyoko Hashimi,Payam OTR/L 06/30/2020, 12:59 PM

## 2020-06-30 NOTE — Progress Notes (Signed)
Patient ID: [name], male   DOB: Mar 03, 1941, 80 y.o.   MRN: [id number] Met with the patient to review role of the nurse CM and collaboration with the SW to facilitate preparation for discharge and assure patient educational needs met. Reviewed secondary risk factors including HTN, HLD with LDL of 101 and DM with A1c of 6.1 along with A-fib. Patient awaiting PEG placement and given handouts on care for the PEG tube and administration of medications via the tube for family reference. Anticipate discharge to SNF initially from rehab. Patient is aware of the plan and in agreement. No questions or concerns noted at present. Voiced that it would be along time before he would expect to eat normally and would like the time to pass quickly however he is realistic regarding recovery time. Continue to follow along to discharge for educational needs. Margarito Liner

## 2020-06-30 NOTE — Progress Notes (Signed)
Physical Therapy Session Note  Patient Details  Name: [name] MRN: [id number] Date of Birth: Apr 06, 1941  Today's Date: 06/30/2020 PT Individual Time: 1415-1445 PT Individual Time Calculation (min): 30 min   Short Term Goals: Week 2:  PT Short Term Goal 1 (Week 2): Pt will perform sit<>stand with max assist of 1. PT Short Term Goal 2 (Week 2): Pt will transfer to [facility] [place] with +2 assis to force WB through RLE PT Short Term Goal 3 (Week 2): Pt will maintain sitting balacne EOB with supervision assist while engaged in dynamic reaching task  Skilled Therapeutic Interventions/Progress Updates:   Pt received supine in bed and agreeable to PT. Supine>sit transfer with max A assist and cues to pivot into sitting EOB. Pt then performed forward scoot to EOB with mod assist from PT to improve anterior weight shift. Sitting balance EOB x 15 with increasing height of bed to force improved use of BUE to maintain static position with CGA from PT overall. Intermittent min assist to reposition in midline as pt demonstrating L lateral bias to reduce fall risk. Manual facilitation to hold scapular retraction 3 x 1 min with noted improvement in posture following 3rd bout. Lateral scoot x4 to the R with mod assist +2 to block the RLE and facilitate adequate anterior weight shift and gluteal lift off bed to reposition closer to [facility].  Sit>supine completed with +2 assist, and left supine in bed with call bell in reach and all needs met.          Therapy Documentation Precautions:  Precautions Precautions: Fall Precaution Comments: R hemiplegia with subluxation Restrictions Weight Bearing Restrictions: No Vital Signs: Therapy Vitals Temp: (!) 97.5 F (36.4 C) Pulse Rate: 89 Resp: (!) 22 BP: 104/79 Patient Position (if appropriate): Lying Oxygen Therapy SpO2: 96 % O2 Device: Room Air Pain: Pain Assessment Pain Scale: Faces Faces Pain Scale: No hurt    Therapy/Group: Individual  Therapy  Lorie Phenix 06/30/2020, 4:31 PM

## 2020-06-30 NOTE — Progress Notes (Signed)
ASPIRE STUDY NOTE  Patient admitted with brainstem hemorrhage and has Atrial Fibrillation. He met all inclusion and exclusion criteria. Informed consent discussed in details with patient and wife and he signed consent with a witness.No study specific procedures were done before informed consent was signed. Modified Rankin scale at time of randomization 5 Patient will be given study medication (aspirin versus Eliquis) as per research pharmacy team  Antony Contras, MD

## 2020-07-01 ENCOUNTER — Inpatient Hospital Stay (HOSPITAL_COMMUNITY): Payer: Medicare Other | Admitting: Physical Therapy

## 2020-07-01 ENCOUNTER — Inpatient Hospital Stay (HOSPITAL_COMMUNITY): Payer: Medicare Other | Admitting: Occupational Therapy

## 2020-07-01 LAB — GLUCOSE, CAPILLARY
Glucose-Capillary: 109 mg/dL — ABNORMAL HIGH (ref 70–99)
Glucose-Capillary: 111 mg/dL — ABNORMAL HIGH (ref 70–99)
Glucose-Capillary: 155 mg/dL — ABNORMAL HIGH (ref 70–99)
Glucose-Capillary: 162 mg/dL — ABNORMAL HIGH (ref 70–99)
Glucose-Capillary: 170 mg/dL — ABNORMAL HIGH (ref 70–99)
Glucose-Capillary: 91 mg/dL (ref 70–99)

## 2020-07-01 MED ORDER — STUDY - ASPIRE - ASPIRIN 81 MG OR PLACEBO TABLET (PI-SETHI): 81.0000 mg | ORAL_TABLET | Freq: Every day | ORAL | Status: DC

## 2020-07-01 MED ORDER — STUDY - ASPIRE - ASPIRIN 81 MG OR PLACEBO TABLET (PI-SETHI)
81.0000 mg | ORAL_TABLET | Freq: Every day | ORAL | Status: DC
  Administered 2020-07-02 – 2020-07-07 (×6): 81 mg
  Filled 2020-07-01 (×6): qty 1

## 2020-07-01 MED ORDER — STUDY - ASPIRE - APIXABAN 5 MG OR PLACEBO TABLET (PI-SETHI)
5.0000 mg | ORAL_TABLET | Freq: Two times a day (BID) | ORAL | Status: DC
  Administered 2020-07-01 – 2020-07-07 (×12): 5 mg
  Filled 2020-07-01 (×12): qty 1

## 2020-07-01 MED ORDER — OSMOLITE 1.5 CAL PO LIQD
355.0000 mL | Freq: Three times a day (TID) | ORAL | Status: DC
  Administered 2020-07-01 – 2020-07-07 (×24): 355 mL
  Filled 2020-07-01 (×10): qty 474

## 2020-07-01 MED ORDER — STUDY - ASPIRE - APIXABAN 5 MG OR PLACEBO TABLET (PI-SETHI): 5.0000 mg | ORAL_TABLET | Freq: Two times a day (BID) | ORAL | Status: DC

## 2020-07-01 NOTE — Progress Notes (Signed)
Nutrition Follow-up  RD working remotely.  DOCUMENTATION CODES:   Not applicable  INTERVENTION:   Transition to bolus tube feeding regimen via PEG: - Goal of 355 ml (1.5 cartons/ARCs) Osmolite 1.5 cal formula QID (total of 6 cartons/ARCs daily)  First bolus: 120 ml (half of carton)  Second bolus: 237 ml (1 full carton)  Third bolus: 355 ml (1.5 cartons) - Continue ProSource TF 45 ml TID - Continue free water flushes of 150 ml q 4 hours  Bolus tube feeding regimen at goal will provide 2253 kcal, 122 grams of protein, and 1084 ml of H2O.   Total free water with flushes: 1994 ml  NUTRITION DIAGNOSIS:   Inadequate oral intake related to inability to eat,dysphagia as evidenced by NPO status.  Ongoing, being addressed via TF  GOAL:   Patient will meet greater than or equal to 90% of their needs  Met via TF  MONITOR:   Diet advancement,Labs,Weight trends,TF tolerance,I & O's  REASON FOR ASSESSMENT:   Consult Enteral/tube feeding initiation and management  ASSESSMENT:   Priscilla Finklea is a 80 year old right-handed male with history of atrial fibrillation maintained on Eliquis, AAA status post stent graft 2006, CAD status post stenting, hypertension hyperlipidemia and OSA.  12/24 - Cortrak placed, tip gastric 01/03 - Cortrak clogged and removed 01/04 - Cortrak replaced, tip gastric 01/05 - PEG tube placed  RD consulted to transition pt to bolus tube feeding regimen. Pt remains NPO but may be able to have diet advanced soon per SLP notes. Will monitor for diet advancement. Discussed bolus tube feeding plan with RN via [place].  CIR admit weight: 93.8 kg Current weight: 95.7 kg  Current TF: Osmolite 1.5 @ 70 ml/hr x 20 hours, ProSource TF 45 ml TID, free water flushes 150 ml q 4 hours  Medications reviewed and include: SSI q 4 hours, magnesium gluconate, protonix, senokot  Labs reviewed. CBG's: 105-170 x 24 hours  Diet Order:   Diet Order            Diet NPO  time specified  Diet effective now                 EDUCATION NEEDS:   Education needs have been addressed  Skin:  Skin Assessment: Reviewed RN Assessment  Last BM:  07/01/20 type 5  Height:   Ht Readings from Last 1 Encounters:  06/16/20 '6\' 2"'  (1.88 m)    Weight:   Wt Readings from Last 1 Encounters:  07/01/20 95.7 kg    Ideal Body Weight:  86.4 kg  BMI:  Body mass index is 27.09 kg/m.  Estimated Nutritional Needs:   Kcal:  2150-2350  Protein:  115-130 grams  Fluid:  > 2 L    Gustavus Bryant, MS, RD, LDN Inpatient Clinical Dietitian Please see AMiON for contact information.

## 2020-07-01 NOTE — Progress Notes (Signed)
Physical Therapy Weekly Progress Note  Patient Details  Name: [name] MRN: [id number] Date of Birth: [birth year]  Beginning of progress report period: June 24, 2020 End of progress report period: July 01, 2020  Today's Date: 07/01/2020 PT Individual Time: 0800-0859 PT Individual Time Calculation (min): 59 min   Patient has met 1 of 3 short term goals.  Pt is making slow progress towards LTG. Over the past week pt has demonstrated improved tolerance to sitting upright between therapy sessions, improved sitting balance EOB, and mild improvement in use of RLE, but not functionally to this point.  He continues to require +2 assist for tranfers but has progressed to perform bed mobility with max assist and WC propulsion with min-mod assist up to 50 ft with hemi technique in TIS WC.   Patient continues to demonstrate the following deficits muscle weakness, muscle joint tightness and muscle paralysis, decreased cardiorespiratoy endurance, impaired timing and sequencing, abnormal tone, unbalanced muscle activation and decreased coordination and decreased sitting balance, decreased standing balance, decreased postural control, hemiplegia and decreased balance strategies and therefore will continue to benefit from skilled PT intervention to increase functional independence with mobility.  Patient not progressing toward long term goals.  See goal revision..  Continue plan of care.  PT Short Term Goals Week 2:  PT Short Term Goal 1 (Week 2): Pt will perform sit<>stand with max assist of 1. PT Short Term Goal 1 - Progress (Week 2): Met PT Short Term Goal 2 (Week 2): Pt will transfer to [facility] [place] with +2 assis to force WB through RLE PT Short Term Goal 2 - Progress (Week 2): Partly met PT Short Term Goal 3 (Week 2): Pt will maintain sitting balacne EOB with supervision assist while engaged in dynamic reaching task PT Short Term Goal 3 - Progress (Week 2): Not met Week 3:  PT Short Term Goal 1  (Week 3): Pt will transfer to to [facility] with max assist of 1 PT Short Term Goal 2 (Week 3): Pt will ambulate 5 ft with max A +2 to force use of RLE PT Short Term Goal 3 (Week 3): Pt will propel WC 100 ft with min assist using Hemi technique  Skilled Therapeutic Interventions/Progress Updates:   Pt received supine in bed and agreeable to PT. Pt noted to have been incontinent of bladder and slightly of bowel. rolling in Bed with mod assist to doff soiled brief. Peri care performed by PT.  Supine>sit transfer with max assist of 1 through log roll. Pt noted to have activation of RUE into hip flexion and pt utilized LLE to aid with brining RLE off the Edge of bed. Static Sitting balance EOB with mod assist initially progressing to supervision assist. Total A to don pants while sitting EOB and min assist to prevent posterior LOB. SARA lift transfer to WC and pull pants to waist.   Pt transported to hall outside rehab gym. Sit<>stand at rail in hall x2 with max assist of 2 progressing to max assist of 1. Pt demonstrated improved posture compared to previous session and able to achieve near erect posture on the second and third attempt. Pt performed reciprocal stepping at rail x 2 steps BLE to initiate gait training. Total A to advance the RLE as well  Block knee/hip/trunk into extension to prevent Buckling on the R side, as well as +2 for WC follow. Patient returned to room and left sitting in [facility] with call bell in reach and all needs met.  Therapy Documentation Precautions:  Precautions Precautions: Fall Precaution Comments: R hemiplegia with subluxation Restrictions Weight Bearing Restrictions: No   Pain:   denies  Therapy/Group: Individual Therapy  Lorie Phenix 07/01/2020, 9:18 AM

## 2020-07-01 NOTE — Progress Notes (Signed)
Occupational Therapy Session Note  Patient Details  Name: [name] MRN: [id number] Date of Birth: 11/01/1940  Today's Date: 07/01/2020 OT Individual Time:  0930-1030 OT Individual Time Calculation (min): 60 min    Short Term Goals: Week 2:  OT Short Term Goal 1 (Week 2): Pt will don shirt with Mod A OT Short Term Goal 2 (Week 2): Pt will complete toilet transfer with LRAD and +2 assist to work on OOB toileting OT Short Term Goal 3 (Week 2): Pt will bathe UB with Mod A  Skilled Therapeutic Interventions/Progress Updates:    Pt seen this session to facilitate postural control with use of the Sara Plus and sling.  +2 A this session to place sling around pt and work on his being upright in [place] with facilitation to engage neck, upper back and glutes.  Worked on pt tolerance to being upright focusing on 1-3 minutes at a time for several trials.  Pt did quite well with this. Discussed with pt how this could be a method for him to use to get to a BSC.   Pt reports clearer vision today with no c/o diplopia, able to read signs on wall.  Tried saebo stim with samll pads to facilitate wrist extension but unable to get a response even at the highest setting.     Used Clarise Cruz again to transfer pt back to bed.   Adjusted pt in bed with all needs met, pillows under R arm to support arm.   Pt more alert and attentive today and engaged well in answering questions.   Resting in bed with all needs met, bed alarm set.   Therapy Documentation Precautions:  Precautions Precautions: Fall Precaution Comments: R hemiplegia with subluxation Restrictions Weight Bearing Restrictions: No    Vital Signs: Therapy Vitals Pulse Rate: 85 BP: 135/87 Pain: Pain Assessment Pain Score: 0-No pain     Therapy/Group: Individual Therapy  [place] 07/01/2020, 12:47 PM

## 2020-07-01 NOTE — Progress Notes (Signed)
Physical Therapy Session Note  Patient Details  Name: [name] MRN: [id number] Date of Birth: 1941-02-11  Today's Date: 07/01/2020 PT Individual Time: 2091-0681 PT Individual Time Calculation (min): 57 min   Short Term Goals: Week 3:  PT Short Term Goal 1 (Week 3): Pt will transfer to to [facility] with max assist of 1 PT Short Term Goal 2 (Week 3): Pt will ambulate 5 ft with max A +2 to force use of RLE PT Short Term Goal 3 (Week 3): Pt will propel WC 100 ft with min assist using Hemi technique  Skilled Therapeutic Interventions/Progress Updates:   Pt received supine in bed and agreeable to PT.  Pt noted to have been extremely incontinent of bladder. Rolling R and L to doff soiled brief with min assist to the R and mod assist to the L. Peri care completed by PT. pt noted to have small stool incontinence. Pt requested to sit on bed pan to attempt BM. Rolling R with min assist to place bed pan. Pt unable to void bowels. Supine NMR: bridge with 3 sec hold 2 x 6 with RLE stabilized. Hip abduction 2 x 10 with AAROM. Hip/knee flexion/extension 2 x 10. Clam shells with level 1 tband and AAROM on the R 2 x 12. Lumbar rotation with manual resistance from PT x 12 with static hold x 5 on the R. Pt left supine in bed with call bell in reach and all needs met.      Therapy Documentation Precautions:  Precautions Precautions: Fall Precaution Comments: R hemiplegia with subluxation Restrictions Weight Bearing Restrictions: No General: PT Amount of Missed Time (min): 15 Minutes PT Missed Treatment Reason: Patient fatigue Pain: denies   Therapy/Group: Individual Therapy  Lorie Phenix 07/01/2020, 6:17 PM

## 2020-07-01 NOTE — Progress Notes (Signed)
[place] PHYSICAL MEDICINE & REHABILITATION PROGRESS NOTE   Subjective/Complaints:  PEG placed , some soreness around PEG tube relieved by tylenol  ROS: Denies CP, SOB, N/V/D  Objective:   IR GASTROSTOMY TUBE MOD SED  Result Date: 06/29/2020 INDICATION: 80 year old male referred for gastrostomy EXAM: PERC PLACEMENT GASTROSTOMY MEDICATIONS: Ancef 2 gm IV; Antibiotics were administered within 1 hour of the procedure. ANESTHESIA/SEDATION: Versed 0.5 mg IV; Fentanyl 50 mcg IV Moderate Sedation Time: 10 minutes The patient was continuously monitored during the procedure by the interventional radiology nurse under my direct supervision. CONTRAST:  50mL OMNIPAQUE IOHEXOL 300 MG/ML SOLN - administered into the gastric lumen. FLUOROSCOPY TIME:  Fluoroscopy Time: 2 minutes 24 seconds (22 mGy). COMPLICATIONS: None PROCEDURE: Informed written consent was obtained from the patient and the patient's family after a thorough discussion of the procedural risks, benefits and alternatives. All questions were addressed. Maximal Sterile Barrier Technique was utilized including caps, mask, sterile gowns, sterile gloves, sterile drape, hand hygiene and skin antiseptic. A timeout was performed prior to the initiation of the procedure. The epigastrium was prepped with Betadine in a sterile fashion, and a sterile drape was applied covering the operative field. A sterile gown and sterile gloves were used for the procedure. A 5-French orogastric tube is placed under fluoroscopic guidance. Scout imaging of the abdomen confirms barium within the transverse colon. The stomach was distended with gas. Under fluoroscopic guidance, an 18 gauge needle was utilized to puncture the anterior wall of the body of the stomach. An Amplatz wire was advanced through the needle passing a T fastener into the lumen of the stomach. The T fastener was secured for gastropexy. A 9-French sheath was inserted. A snare was advanced through the 9-French  sheath. A Teena Dunk was advanced through the orogastric tube. It was snared then pulled out the oral cavity, pulling the snare, as well. The leading edge of the gastrostomy was attached to the snare. It was then pulled down the esophagus and out the percutaneous site. Tube secured in place. Contrast was injected. Patient tolerated the procedure well and remained hemodynamically stable throughout. No complications were encountered and no significant blood loss encountered. IMPRESSION: Status post fluoroscopic placed percutaneous gastrostomy tube, with 20 [place] pull-through. Signed, Yvone Neu. Loreta Ave, DO [facility] Radiology Electronically Signed   By: Gilmer Mor D.O.   On: 06/29/2020 14:43   Recent Labs    06/29/20 0525  WBC 8.2  HGB 15.8  HCT 47.3  PLT 195   No results for input(s): NA, K, CL, CO2, GLUCOSE, BUN, CREATININE, CALCIUM in the last 72 hours. No intake or output data in the 24 hours ending 07/01/20 0712      Physical Exam: Vital Signs Blood pressure 122/63, pulse (!) 55, temperature 98.8 F (37.1 C), temperature source Oral, resp. rate 20, height 6\' 2"  (1.88 m), weight 95.7 kg, SpO2 95 %.    General: No acute distress Mood and affect are appropriate Heart: Regular rate and rhythm no rubs murmurs or extra sounds Lungs: Clear to auscultation, breathing unlabored, no rales or wheezes Abdomen: PEG site with pressure dressing, mild tenderness around tube, remainder of abd non tender  Extremities: No clubbing, cyanosis, or edema Skin: No evidence of breakdown, no evidence of rash  Musc: Right hand edema.  No tenderness in extremities. Neuro: Alert Severe dysarthria, unchanged Motor: RUE/RLE: 0/5 proximal distal, Except trace R hip add and finger flexion but not able to perform consistently  Assessment/Plan: 1. Functional deficits which require 3+ hours per day of interdisciplinary therapy in a comprehensive inpatient rehab  setting.  Physiatrist is providing close team supervision and 24 hour management of active medical problems listed below.  Physiatrist and rehab team continue to assess barriers to discharge/monitor patient progress toward functional and medical goals  Care Tool:  Bathing    Body parts bathed by patient: Chest,Abdomen,Face,Right upper leg,Left upper leg,Front perineal area   Body parts bathed by helper: Right arm,Left arm,Buttocks,Right lower leg,Left lower leg     Bathing assist Assist Level: Moderate Assistance - Patient 50 - 74%     Upper Body Dressing/Undressing Upper body dressing   What is the patient wearing?: Pull over shirt    Upper body assist Assist Level: Maximal Assistance - Patient 25 - 49%    Lower Body Dressing/Undressing Lower body dressing      What is the patient wearing?: Incontinence brief,Pants     Lower body assist Assist for lower body dressing: 2 Helpers     Toileting Toileting    Toileting assist Assist for toileting: 2 Helpers     Transfers Chair/bed transfer  Transfers assist  Chair/bed transfer activity did not occur: Safety/medical concerns  Chair/bed transfer assist level: Total Assistance - Patient < 25%     Locomotion Ambulation   Ambulation assist   Ambulation activity did not occur: Safety/medical concerns          Walk 10 feet activity   Assist  Walk 10 feet activity did not occur: Safety/medical concerns        Walk 50 feet activity   Assist Walk 50 feet with 2 turns activity did not occur: Safety/medical concerns         Walk 150 feet activity   Assist Walk 150 feet activity did not occur: Safety/medical concerns         Walk 10 feet on uneven surface  activity   Assist Walk 10 feet on uneven surfaces activity did not occur: Safety/medical concerns         Wheelchair     Assist Will patient use wheelchair at discharge?: Yes Type of Wheelchair: Manual (TBD) Wheelchair activity  did not occur: Safety/medical concerns         Wheelchair 50 feet with 2 turns activity    Assist    Wheelchair 50 feet with 2 turns activity did not occur: Safety/medical concerns       Wheelchair 150 feet activity     Assist  Wheelchair 150 feet activity did not occur: Safety/medical concerns       Blood pressure 122/63, pulse (!) 55, temperature 98.8 F (37.1 C), temperature source Oral, resp. rate 20, height 6\' 2"  (1.88 m), weight 95.7 kg, SpO2 95 %.    Medical Problem List and Plan: 1.  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  Antithrombotics: -DVT/anticoagulation: SCDs             -antiplatelet therapy: N/A 3. Pain Management: Continue Tylenol as needed.   Controlled with meds 1/2 4. Mood: Provide emotional support             -antipsychotic agents: N/A 5. Neuropsych: This patient is capable of making decisions on his own behalf. 6. Skin/Wound Care:  Routine skin checks 7. Fluids/Electrolytes/Nutrition: Routine I/O's   BMP within acceptable range on 12/24, labs ordered for tomorrow 8.    Post stroke dysphagia.   NPO,PEG feeds to start today  9.  Atrial fibrillation.  Eliquis discontinued due to ICH.  Cardiac rate controlled.  Continue beta-blocker  Rate controlled on 1/2 10.  Hypertension.  Norvasc 10 mg daily. Increased Lopressor to 25mg  TID.   Lisinopril 2.5mg  on 12/27.   Monitor with increased mobility Vitals:   06/30/20 2012 07/01/20 0429  BP: 117/72 122/63  Pulse: 84 (!) 55  Resp: 18 20  Temp: 98.2 F (36.8 C) 98.8 F (37.1 C)  SpO2: 94% 95%   Controlled on 1/7 11.  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  Contact precautions 12.  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  Hemoglobin A1c  6.1.  SSI.  CBG (last 3)  Recent Labs    06/30/20 2000 07/01/20 0017 07/01/20 0414  GLUCAP 133* 162* 170*   Controlled but may need adjustment on bolus feeds 13.  GERD.  Continue Protonix 14. Hypomagnesemia: Started magnesium gluconate 250mg  daily  Magnesium 1.6 on 12/25, labs ordered for tomorrow  LOS: 15 days A FACE TO FACE EVALUATION WAS PERFORMED  07/01/2020, 7:12 AM

## 2020-07-01 NOTE — Progress Notes (Signed)
[facility] Investigational Drug Service - Initial Enrollment Study: ASPIRE Principal Investigator (PI): Pramod Sethi Research Group: [facility]  SUMMARY For more information refer to: [email or web address] Study Identifier: [id number] Title Anticoagulation in Intracerebral Hemorrhage Survivors for Stroke Prevention and Recovery- a randomized, double-blind, Phase 3 clinical trial designed to test the efficacy and safety of anticoagulation, compared with aspirin, in patients with a recent ICH and high-risk non-valvular AF.  Brief Summary ASPIRE is a randomized, double-blinded, phase III clinical trial designed to test the efficacy and safety of anticoagulation, compared with aspirin, in patients with a recent ICH and high-risk non-valvular AF (CHA2DS2-VASc score ? 2).   Design Phase 3, randomized, blinded, prevention trial.    Participant Population Inclusion: >67 yo, ICH confirmed by CT or MRI, can be randomized within 14 - 120 days after ICH onset.  Non-valvular AF, CHA2DS2-VASc score > 2. Exclusion: infective endocarditis, SCr > 2.5 mg/dL, active hepatitis/hepatic insufficiency Child-Pugh score B or C, Hgb <8 g/dL or plt <937J.  Outcomes . To determine if Apixaban is superior to Aspirin for prevention of the composite outcome of any stroke (hemorrhagic or ischemic) or death from any cause in patients with recent ICH and AF.    Additional Information . First dose of study medication should be taking within 48 hours of randomization.    Prohibited Therapy: Participants will be advised against use of open-label antiplatelet and anticoagulation medications due to potential interaction with the study. Study drug must be held if any of the following open-label medications are started: Aspirin, non-aspirin antiplatelet, or anticoagulant therapy.  Restart study as soon as the open-label medication has been stopped. Dose Adjustment Criteria: Apixaban 2.5/Placebo will be used for subjects  with ? 2 of the following are present: age ? 80 years, body weight ? 60 kg, or serum creatinine 1.5- 2.4 mg/dl  or subject is taking a strong CYP3A4 /pGP inhibitor (ketoconazole, itraconazole, ritonavir, or clarithromycin).   Tube Administration: Apixaban/placebo may be crushed for g-tube administration (g-tube feed held for 1 hour before and 1 hour after administration).  __________________________________________________   ASPIRE study drug is supplied by Main [facility], at discharge please pick up patient supply.   Investigational Drug Service Pharmacy is following. Any questions or concerns please reach out to the Investigational Drug Service at [phone] or [phone]  Charlett Nose, PharmD  Investigational Drug Clinical Pharmacist Phone: [phone]

## 2020-07-02 LAB — GLUCOSE, CAPILLARY
Glucose-Capillary: 108 mg/dL — ABNORMAL HIGH (ref 70–99)
Glucose-Capillary: 123 mg/dL — ABNORMAL HIGH (ref 70–99)
Glucose-Capillary: 145 mg/dL — ABNORMAL HIGH (ref 70–99)
Glucose-Capillary: 173 mg/dL — ABNORMAL HIGH (ref 70–99)
Glucose-Capillary: 184 mg/dL — ABNORMAL HIGH (ref 70–99)
Glucose-Capillary: 191 mg/dL — ABNORMAL HIGH (ref 70–99)
Glucose-Capillary: 85 mg/dL (ref 70–99)

## 2020-07-02 MED ORDER — INSULIN ASPART 100 UNIT/ML ~~LOC~~ SOLN
0.0000 [IU] | Freq: Three times a day (TID) | SUBCUTANEOUS | Status: DC
  Administered 2020-07-02 (×2): 3 [IU] via SUBCUTANEOUS
  Administered 2020-07-03 (×2): 5 [IU] via SUBCUTANEOUS
  Administered 2020-07-04: 2 [IU] via SUBCUTANEOUS
  Administered 2020-07-04: 3 [IU] via SUBCUTANEOUS
  Administered 2020-07-04: 2 [IU] via SUBCUTANEOUS
  Administered 2020-07-05: 5 [IU] via SUBCUTANEOUS
  Administered 2020-07-06: 2 [IU] via SUBCUTANEOUS

## 2020-07-02 NOTE — Progress Notes (Signed)
PHYSICAL MEDICINE & REHABILITATION PROGRESS NOTE   Subjective/Complaints:  C/o right wrist being sore/stiff. Otherwise not much new  ROS: Patient denies fever, rash, sore throat, blurred vision, nausea, vomiting, diarrhea, cough, shortness of breath or chest pain,   headache, or mood change.    Objective:   No results found. No results for input(s): WBC, HGB, HCT, PLT in the last 72 hours. No results for input(s): NA, K, CL, CO2, GLUCOSE, BUN, CREATININE, CALCIUM in the last 72 hours. No intake or output data in the 24 hours ending 07/02/20 1133      Physical Exam: Vital Signs Blood pressure 120/70, pulse 84, temperature 97.7 F (36.5 C), resp. rate 18, height 6\' 2"  (1.88 m), weight 95 kg, SpO2 94 %.    Constitutional: No distress . Vital signs reviewed. HEENT: EOMI, oral membranes moist Neck: supple Cardiovascular: RRR without murmur. No JVD    Respiratory/Chest: CTA Bilaterally without wheezes or rales. Normal effort    GI/Abdomen: BS +, non-tender, non-distended Ext: no clubbing, cyanosis, or edema Psych: pleasant and cooperative Skin: No evidence of breakdown, no evidence of rash  Musc: Right hand/wrist edema with mild TTP.  No tenderness in extremities. Neuro: Alert Severe dysarthria ongoing Motor: RUE/RLE: 0/5 proximal distal, Except trace R hip add and finger flexion but not able to perform consistently --wearing WHO  Assessment/Plan: 1. Functional deficits which require 3+ hours per day of interdisciplinary therapy in a comprehensive inpatient rehab setting.  Physiatrist is providing close team supervision and 24 hour management of active medical problems listed below.  Physiatrist and rehab team continue to assess barriers to discharge/monitor patient progress toward functional and medical goals  Care Tool:  Bathing    Body parts bathed by patient: Chest,Abdomen,Face,Right upper leg,Left upper leg,Front perineal area   Body parts bathed by  helper: Right arm,Left arm,Buttocks,Right lower leg,Left lower leg     Bathing assist Assist Level: Moderate Assistance - Patient 50 - 74%     Upper Body Dressing/Undressing Upper body dressing   What is the patient wearing?: Pull over shirt    Upper body assist Assist Level: Maximal Assistance - Patient 25 - 49%    Lower Body Dressing/Undressing Lower body dressing      What is the patient wearing?: Incontinence brief,Pants     Lower body assist Assist for lower body dressing: 2 Helpers     Toileting Toileting    Toileting assist Assist for toileting: 2 Helpers     Transfers Chair/bed transfer  Transfers assist  Chair/bed transfer activity did not occur: Safety/medical concerns  Chair/bed transfer assist level: Total Assistance - Patient < 25%     Locomotion Ambulation   Ambulation assist   Ambulation activity did not occur: Safety/medical concerns          Walk 10 feet activity   Assist  Walk 10 feet activity did not occur: Safety/medical concerns        Walk 50 feet activity   Assist Walk 50 feet with 2 turns activity did not occur: Safety/medical concerns         Walk 150 feet activity   Assist Walk 150 feet activity did not occur: Safety/medical concerns         Walk 10 feet on uneven surface  activity   Assist Walk 10 feet on uneven surfaces activity did not occur: Safety/medical concerns         Wheelchair     Assist Will patient use wheelchair at discharge?: Yes  Type of Wheelchair: Manual (TBD) Wheelchair activity did not occur: Safety/medical concerns         Wheelchair 50 feet with 2 turns activity    Assist    Wheelchair 50 feet with 2 turns activity did not occur: Safety/medical concerns       Wheelchair 150 feet activity     Assist  Wheelchair 150 feet activity did not occur: Safety/medical concerns       Blood pressure 120/70, pulse 84, temperature 97.7 F (36.5 C), resp. rate 18,  height 6\' 2"  (1.88 m), weight 95 kg, SpO2 94 %.    Medical Problem List and Plan: 1.  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  Antithrombotics: -DVT/anticoagulation: SCDs             -antiplatelet therapy: N/A 3. Pain Management: Continue Tylenol as needed.   Controlled with meds 1/8 4. Mood: Provide emotional support             -antipsychotic agents: N/A 5. Neuropsych: This patient is capable of making decisions on his own behalf. 6. Skin/Wound Care: Routine skin checks 7. Fluids/Electrolytes/Nutrition: Routine I/O's   BMP within acceptable range on 12/24  8.    Post stroke dysphagia.   NPO,PEG feeds   9.  Atrial fibrillation.  Eliquis discontinued due to ICH.  Cardiac rate controlled.  Continue beta-blocker  Rate controlled on 1/8 10.  Hypertension.  Norvasc 10 mg daily. Increased Lopressor to 25mg  TID.   Lisinopril 2.5mg  on 12/27.   Monitor with increased mobility Vitals:   07/02/20 0359 07/02/20 1025  BP: 122/69 120/70  Pulse: 84   Resp: 18   Temp: 97.7 F (36.5 C)   SpO2: 94%    Controlled on 1/8 11.  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  Contact precautions 12.  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  Hemoglobin A1c 6.1.  SSI.  CBG (last 3)  Recent Labs    07/02/20 0359 07/02/20 0808 07/02/20 1128  GLUCAP 85 108* 123*    Controlled fairly well with bolus feeds 13.  GERD.  Continue Protonix 14. Hypomagnesemia: Started magnesium gluconate 250mg  daily  Magnesium 1.6 on 12/25   LOS: 16 days A FACE TO FACE EVALUATION WAS PERFORMED  08/30/20 07/02/2020, 11:33 AM

## 2020-07-03 LAB — GLUCOSE, CAPILLARY
Glucose-Capillary: 102 mg/dL — ABNORMAL HIGH (ref 70–99)
Glucose-Capillary: 222 mg/dL — ABNORMAL HIGH (ref 70–99)
Glucose-Capillary: 73 mg/dL (ref 70–99)
Glucose-Capillary: 247 mg/dL — ABNORMAL HIGH (ref 70–99)

## 2020-07-04 ENCOUNTER — Inpatient Hospital Stay (HOSPITAL_COMMUNITY): Payer: Medicare Other | Admitting: Occupational Therapy

## 2020-07-04 ENCOUNTER — Inpatient Hospital Stay (HOSPITAL_COMMUNITY): Payer: Medicare Other

## 2020-07-04 ENCOUNTER — Inpatient Hospital Stay (HOSPITAL_COMMUNITY): Payer: Medicare Other | Admitting: Speech Pathology

## 2020-07-04 LAB — GLUCOSE, CAPILLARY
Glucose-Capillary: 118 mg/dL — ABNORMAL HIGH (ref 70–99)
Glucose-Capillary: 122 mg/dL — ABNORMAL HIGH (ref 70–99)
Glucose-Capillary: 122 mg/dL — ABNORMAL HIGH (ref 70–99)
Glucose-Capillary: 188 mg/dL — ABNORMAL HIGH (ref 70–99)
Glucose-Capillary: 146 mg/dL — ABNORMAL HIGH (ref 70–99)

## 2020-07-04 MED ORDER — METFORMIN HCL 500 MG PO TABS
500.0000 mg | ORAL_TABLET | Freq: Every day | ORAL | Status: DC
  Administered 2020-07-04 – 2020-07-08 (×5): 500 mg
  Filled 2020-07-04 (×5): qty 1

## 2020-07-04 NOTE — Progress Notes (Signed)
Physical Therapy Session Note  Patient Details  Name: [name] MRN: [id number] Date of Birth: 03/02/1941  Today's Date: 07/04/2020 PT Individual Time: 0174-9449 PT Individual Time Calculation (min): 71 min   Short Term Goals: Week 2:  PT Short Term Goal 1 (Week 2): Pt will perform sit<>stand with max assist of 1. PT Short Term Goal 1 - Progress (Week 2): Met PT Short Term Goal 2 (Week 2): Pt will transfer to [facility] in [place] with +2 assis to force WB through RLE PT Short Term Goal 2 - Progress (Week 2): Partly met PT Short Term Goal 3 (Week 2): Pt will maintain sitting balacne EOB with supervision assist while engaged in dynamic reaching task PT Short Term Goal 3 - Progress (Week 2): Not met Week 3:  PT Short Term Goal 1 (Week 3): Pt will transfer to to [facility] with max assist of 1 PT Short Term Goal 2 (Week 3): Pt will ambulate 5 ft with max A +2 to force use of RLE PT Short Term Goal 3 (Week 3): Pt will propel WC 100 ft with min assist using Hemi technique  Skilled Therapeutic Interventions/Progress Updates:   Patient seated in TIS w/c upon PT arrival. Patient alert and agreeable to PT session. Patient denied pain during session. Mild soreness in R shoulder noted mid session requiring repositioning for improved comfort. No dizziness reported this session with any changes in positioning.  Therapeutic Activity: Bed Mobility: Patient performed sit--> supine with Max A +2. Provided verbal cues for initiation and technique. Transfers: Patient performed sit/ stand with [place]. Tolerated the Clarise Cruz adequately for transfers requiring physical assist to maintain R arm positioning and verbal and tactile cues for trunk control and glute/ quad activation during movement.  Neuromuscular Re-ed: NMR facilitated with sitting in w/c and on EOM with focus on trunk stability/ posturing and sitting tolerance. Flexed forward posture demo'd throughout requiring upright postural facilitation through tactile and  verbal cues. With fatigue, pt tends to drift posteriorly requiring vc for correction and he is able to self-correct. While seated statically at mat, pt's R arm is placed with palm flat on mat and arm extended to endrange for improved approximation in the [facility] joint. He tolerates sitting bouts up to 98mnutes at most.  Sit <> stand performed from EOM using 3 musketeer support bilaterally. First attempt required heavy Max A +2 and second attempt improved to lighter MaxA +2 both requiring verbal and tactile cueing for activation of quads, glutes, and trunk postural musculature. Standing tolerance in this position improves from 40sec to >114m in second bout.   Patient supine in bed at end of session with bed alarm set, and all needs within reach.       Therapy Documentation Precautions:  Precautions Precautions: Fall Precaution Comments: R hemiplegia with subluxation Restrictions Weight Bearing Restrictions: No   Therapy/Group: Individual Therapy  JuAlger Simons/03/2021, 12:22 PM

## 2020-07-04 NOTE — Progress Notes (Signed)
Speech Language Pathology Daily Session Note  Patient Details  Name: [name] MRN: [id number] Date of Birth: [birth year]  Today's Date: 07/04/2020 SLP Individual Time: 1400-1455 SLP Individual Time Calculation (min): 55 min  Short Term Goals: Week 3: SLP Short Term Goal 1 (Week 3): Pt will participate in MBSS to reassess oropharyngeal swallow function and potential for initiation of PO diet and advise future ST dysphaiga interventions. SLP Short Term Goal 2 (Week 3): Pt will utilize compensatory strategies to increase speech intelligibility to ~80% at the word and phrase level with Min A cues. SLP Short Term Goal 3 (Week 3): Pt will demonstrate recall and carrupver of novel and daily information or therapy techniques with Supervision A verbal cues. SLP Short Term Goal 4 (Week 3): Pt will sustain attention to functional tasks for 20 minute intervals with Supervision A cues for redirection.  Skilled Therapeutic Interventions: Pt was seen for skilled ST targeting dysphagia and cognitive-linguistic goals. Pt was lethargic upon arrival, but able to arouse appropriately with warm wash rag to face and oral care. He then accepted trials of ice chips (10) and pudding (3 oz) without any overt s/sx aspiration. He did intermittently exhibit multiple swallows per bolus, particularly with ice. His oral transit time of purees is becoming more efficient, although he does struggle with endurance. Pt also accepted trials of tsp thin H2O, during which he exhibited either immediate or delayed cough or throat clear in 4/6 opportunities. Recommend continue NPO with PEG for now, but anticipate he will be ready for repeat MBSS this week. Once fully alert, pt's speech was ~85% intelligible. Despite articulatory imprecision, pt is doing exception with use of slow rate and overarticulation as compensatory strategies to increase intelligibility with no more than Supervision A level cues today. Short term memory targeted during  functional tasks, in which he had to recall therapy activities, techniques, and safety precautions, which he did with overall Min  A verbal cues. Supervision A verbal cues provided for sustained attention to functional tasks and topics of conversation. Pt left laying in bed with alarm set and needs within reach. Continue per current plan of care.       Pain Pain Assessment Pain Scale: 0-10 Pain Score: 0-No pain  Therapy/Group: Individual Therapy  [name] 07/04/2020, 7:21 AM

## 2020-07-04 NOTE — Progress Notes (Signed)
[place] PHYSICAL MEDICINE & REHABILITATION PROGRESS NOTE   Subjective/Complaints:  No issues overnight , some soreness in RIght wrist if it gets "in certain positions"  , G tube site still sore but relieved by tylenol  ROS: Patient denies CP, SOB, N/V/D   Objective:   No results found. No results for input(s): WBC, HGB, HCT, PLT in the last 72 hours. No results for input(s): NA, K, CL, CO2, GLUCOSE, BUN, CREATININE, CALCIUM in the last 72 hours. No intake or output data in the 24 hours ending 07/04/20 0743      Physical Exam: Vital Signs Blood pressure 134/88, pulse 73, temperature 98.3 F (36.8 C), temperature source Oral, resp. rate 18, height 6\' 2"  (1.88 m), weight 92.9 kg, SpO2 94 %.     General: No acute distress Mood and affect are appropriate Heart: Regular rate and rhythm no rubs murmurs or extra sounds Lungs: Clear to auscultation, breathing unlabored, no rales or wheezes Abdomen: Positive bowel sounds, soft nontender to palpation, nondistended- mild tenderness around tube Extremities: No clubbing, cyanosis, or edema Skin: No evidence of breakdown, no evidence of rash- no bleeding from PEG  Skin: No evidence of breakdown, no evidence of rash  Musc: Right hand/wrist edema with mild TTP.  No tenderness in extremities. Neuro: lethargic but able to communicate with dysarthria Clonus RIght wrist flexors increased tone RIght wrist pronators 2- Right ankle DF but not able to reproduce  Severe dysarthria ongoing Motor: RUE/RLE: 0/5 proximal distal, Except trace R hip add and finger flexion but not able to perform consistently --wearing WHO  Assessment/Plan: 1. Functional deficits which require 3+ hours per day of interdisciplinary therapy in a comprehensive inpatient rehab setting.  Physiatrist is providing close team supervision and 24 hour management of active medical problems listed below.  Physiatrist and rehab team continue to assess barriers to  discharge/monitor patient progress toward functional and medical goals  Care Tool:  Bathing    Body parts bathed by patient: Chest,Abdomen,Face,Right upper leg,Left upper leg,Front perineal area   Body parts bathed by helper: Right arm,Left arm,Buttocks,Right lower leg,Left lower leg     Bathing assist Assist Level: Moderate Assistance - Patient 50 - 74%     Upper Body Dressing/Undressing Upper body dressing   What is the patient wearing?: Pull over shirt    Upper body assist Assist Level: Maximal Assistance - Patient 25 - 49%    Lower Body Dressing/Undressing Lower body dressing      What is the patient wearing?: Incontinence brief,Pants     Lower body assist Assist for lower body dressing: 2 Helpers     Toileting Toileting    Toileting assist Assist for toileting: 2 Helpers     Transfers Chair/bed transfer  Transfers assist  Chair/bed transfer activity did not occur: Safety/medical concerns  Chair/bed transfer assist level: Total Assistance - Patient < 25%     Locomotion Ambulation   Ambulation assist   Ambulation activity did not occur: Safety/medical concerns          Walk 10 feet activity   Assist  Walk 10 feet activity did not occur: Safety/medical concerns        Walk 50 feet activity   Assist Walk 50 feet with 2 turns activity did not occur: Safety/medical concerns         Walk 150 feet activity   Assist Walk 150 feet activity did not occur: Safety/medical concerns         Walk 10 feet on uneven  surface  activity   Assist Walk 10 feet on uneven surfaces activity did not occur: Safety/medical concerns         Wheelchair     Assist Will patient use wheelchair at discharge?: Yes Type of Wheelchair: Manual (TBD) Wheelchair activity did not occur: Safety/medical concerns         Wheelchair 50 feet with 2 turns activity    Assist    Wheelchair 50 feet with 2 turns activity did not occur: Safety/medical  concerns       Wheelchair 150 feet activity     Assist  Wheelchair 150 feet activity did not occur: Safety/medical concerns       Blood pressure 134/88, pulse 73, temperature 98.3 F (36.8 C), temperature source Oral, resp. rate 18, height 6\' 2"  (1.88 m), weight 92.9 kg, SpO2 94 %.    Medical Problem List and Plan: 1.  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 fleor Withdrawal vs voluntary  PEG feeds converted to bolus feeds per dietician  WHO/PRAFO ordered 2.  Antithrombotics: -DVT/anticoagulation: SCDs             -antiplatelet therapy: N/A 3. Pain Management: Continue Tylenol as needed.   Mild RIght Wrist pain   Without physical exam finding , may be tone related, now has clonus right wrist flexors ,monitor cont OT 4. Mood: Provide emotional support             -antipsychotic agents: N/A 5. Neuropsych: This patient is capable of making decisions on his own behalf. 6. Skin/Wound Care: Routine skin checks 7. Fluids/Electrolytes/Nutrition: Routine I/O's   BMP within acceptable range on 12/24  8.    Post stroke dysphagia.   NPO,PEG feeds   9.  Atrial fibrillation.  Eliquis discontinued due to ICH.  Cardiac rate controlled.  Continue beta-blocker  Rate controlled on 1/8 10.  Hypertension.  Norvasc 10 mg daily. Increased Lopressor to 25mg  TID.   Lisinopril 2.5mg  on 12/27.   Monitor with increased mobility Vitals:   07/03/20 2002 07/04/20 0340  BP: 118/78 134/88  Pulse: 81 73  Resp: 19 18  Temp: 98 F (36.7 C) 98.3 F (36.8 C)  SpO2: 94% 94%   Controlled on 1/10 11.  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  Contact precautions 12.  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  Hemoglobin A1c 6.1.  SSI.  CBG (last 3)   Recent Labs    07/03/20 1623 07/03/20 2055 07/04/20 0605  GLUCAP 73 247* 118*    Controlled fairly well with bolus feeds 13.  GERD.  Continue Protonix 14. Hypomagnesemia: Started magnesium gluconate 250mg  daily  Magnesium 1.6 on 12/25  And 1/3  LOS: 18 days A FACE TO FACE EVALUATION WAS PERFORMED  09/01/20 07/04/2020, 7:43 AM

## 2020-07-04 NOTE — Progress Notes (Signed)
Occupational Therapy Weekly Progress Note  Patient Details  Name: [name] MRN: [id number] Date of Birth: Jun 28, 1940  Beginning of progress report period: June 25, 2020 End of progress report period: July 04, 2020  Today's Date: 07/04/2020 OT Individual Time: 7078-6754 OT Individual Time Calculation (min): 58 min    Patient has met 2 of 3 short term goals.  [name] is making steady but slow progress with OT at this time.  He currently continues to need overall min to mod assist for static sitting balance EOB with max assist for dynamic sitting balance during UB bathing and dressing unsupported.  He is able to complete UB bathing in supported sitting with mod assist but needs max assist for donning a pullover shirt.  LB bathing and dressing are currently completed in supine rolling with total assist.  Transfers are at a total assist +2 (pt 20%) for bed to wheelchair and to the drop arm commode simulated.  He continues to demonstrate significant RUE hemiparesis, currently demonstrating only trace shoulder movement at Brunnstrum stage II movement in the right arm and stage I in the hand.  Slight edema is noted in the forearm and elbow at this time as well.  [name] continues to report some diplopia but is it not always consistent at this time.  Feel overall he will continue to need comprehensive OT at this time with anticipation of transition to SNF for follow-up in the near future.    Patient continues to demonstrate the following deficits: muscle weakness and muscle paralysis, impaired timing and sequencing, unbalanced muscle activation and decreased coordination, diplopia, decreased attention, decreased problem solving and decreased memory and decreased sitting balance, decreased standing balance, decreased postural control, hemiplegia and decreased balance strategies and therefore will continue to benefit from skilled OT intervention to enhance overall performance with BADL and Reduce  care partner burden.  Patient progressing toward long term goals..  Continue plan of care.  OT Short Term Goals Week 3:  OT Short Term Goal 1 (Week 3): Pt will don shirt with Mod A. OT Short Term Goal 2 (Week 3): Pt will maintain dynamic sitting balance EOB with min assist during UB bathing tasks. OT Short Term Goal 3 (Week 3): Pt will complete sit to stand with total +2 (pt 30%) during LB bathing and dressing tasks. OT Short Term Goal 4 (Week 3): Pt will complete sliding board transfer with total assist +2 (pt 30%) to a drop arm commode.  Skilled Therapeutic Interventions/Progress Updates:    Pt completed bathing and dressing in supine HOB up to supine flat to start.  He needed setup for donning the front peri area HOB elevated.   Therapist assisted with washing back peri area and feet.  He assisted with bringing the LEs up for pt to wash down below his knees as well, but could not reach past his ankles from this position.  Total assist was needed for donning brief, pants, TEDS, and gripper socks in supine before transitioning to sitting.  He needed max assist for supine to sit and then worked on UB bathing and dressing.  Initial max assist was needed for balance statically secondary to posterior and right LOB.  He progressed to min however when removing pullover shirt and working on bathing, he needed max assist dynamically.  He needs max assist hand over hand for using the RUE to wash the LUE as well as max assist for donning a new shirt.  Finished session with transfer via sliding board  to the wheelchair with total assist +2 (pt 20%).  He was able to comb his hair from semi reclined position with setup using the LUE.  Pt left up with the call button and phone in reach and safety belt in place.    Therapy Documentation Precautions:  Precautions Precautions: Fall Precaution Comments: R hemiplegia with subluxation Restrictions Weight Bearing Restrictions: No   Pain: Pain Assessment Pain  Scale: Faces Faces Pain Scale: Hurts a little bit Pain Type: Acute pain Pain Location: Abdomen Pain Orientation: Mid Pain Descriptors / Indicators: Discomfort Pain Onset: With Activity Pain Intervention(s): Repositioned ADL: See Care Tool Section for some details of mobility and selfcare  Therapy/Group: Individual Therapy  Aubrii Sharpless,Demerius OTR/L 07/04/2020, 12:42 PM

## 2020-07-05 ENCOUNTER — Inpatient Hospital Stay (HOSPITAL_COMMUNITY): Payer: Medicare Other | Admitting: Occupational Therapy

## 2020-07-05 ENCOUNTER — Inpatient Hospital Stay (HOSPITAL_COMMUNITY): Payer: Medicare Other

## 2020-07-05 ENCOUNTER — Inpatient Hospital Stay (HOSPITAL_COMMUNITY): Payer: Medicare Other | Admitting: Physical Therapy

## 2020-07-05 LAB — GLUCOSE, CAPILLARY
Glucose-Capillary: 115 mg/dL — ABNORMAL HIGH (ref 70–99)
Glucose-Capillary: 212 mg/dL — ABNORMAL HIGH (ref 70–99)
Glucose-Capillary: 104 mg/dL — ABNORMAL HIGH (ref 70–99)
Glucose-Capillary: 69 mg/dL — ABNORMAL LOW (ref 70–99)
Glucose-Capillary: 110 mg/dL — ABNORMAL HIGH (ref 70–99)

## 2020-07-05 NOTE — Progress Notes (Signed)
Patient ID: [name], male   DOB: 01/18/1941, 80 y.o.   MRN: [id number]   SW informed patent spouse of current bed offers. Spouse reports she does not like the facilities that have extended offers. SW offered to send patient referral out to another county, spouse declined due to wanting to keep patient in their area. Spouse reports she will speak with daughter and follow up with SW tomorrow.   [place], [place] [phone]

## 2020-07-05 NOTE — Progress Notes (Addendum)
Physical Therapy Session Note  Patient Details  Name: [name] MRN: [id number] Date of Birth: [birth year]  Today's Date: 07/05/2020 PT Individual Time: 1015-1100 PT Individual Time Calculation (min): 45 min   Short Term Goals: Week 2:  PT Short Term Goal 1 (Week 2): Pt will perform sit<>stand with max assist of 1. PT Short Term Goal 1 - Progress (Week 2): Met PT Short Term Goal 2 (Week 2): Pt will transfer to [facility] in [place] with +2 assis to force WB through RLE PT Short Term Goal 2 - Progress (Week 2): Partly met PT Short Term Goal 3 (Week 2): Pt will maintain sitting balacne EOB with supervision assist while engaged in dynamic reaching task PT Short Term Goal 3 - Progress (Week 2): Not met Week 3:  PT Short Term Goal 1 (Week 3): Pt will transfer to to [facility] with max assist of 1 PT Short Term Goal 2 (Week 3): Pt will ambulate 5 ft with max A +2 to force use of RLE PT Short Term Goal 3 (Week 3): Pt will propel WC 100 ft with min assist using Hemi technique Week 4:     Skilled Therapeutic Interventions/Progress Updates:    Patient seated upright in TIS w/c upon PT arrival. Patient alert and agreeable to PT session. Patient denied pain during session.  Therapeutic Activity: Transfers: Patient performed sit to/from stand throughout session using L sided HR in hallway and requiring Max A to reach standing and to control descent. Provided verbal cues for technique including foot placement, hand placement, level gaze, and upright posture including tactile cues for glute and quad activation with good corrections made by pt.  Gait Training:  Patient ambulated 3-4 steps per bout using LHR and r arm over this PT's shoulders with Max A for completing RLE advancement and placement with pt able to initiate step with weak but appropriate muscle activation to flex hip. +2 for w/c follow. Weight shifting provided to pt along with R knee block for improved step advancement of LLE with pt requiring Min A to  advance LLE. Provided verbal cues for upright posture, sequencing of steps, glute and quad muscle activation.  Neuromuscular Re-ed: NMR facilitated during standing with vc and tc for improving proprioception of body posture as well as during weight shifting while standing at hallway HR.  Patient seated upright in TIS w/c at end of session with brakes locked, chair alarm set, and all needs within reach.    Therapy Documentation Precautions:  Precautions Precautions: Fall Precaution Comments: R hemiplegia with subluxation Restrictions Weight Bearing Restrictions: No    Therapy/Group: Individual Therapy  [name] 07/05/2020, 5:00 PM

## 2020-07-05 NOTE — Progress Notes (Signed)
Occupational Therapy Session Note  Patient Details  Name: [name] MRN: [id number] Date of Birth: 11-27-1940  Today's Date: 07/05/2020 OT Individual Time: 1583-0940 OT Individual Time Calculation (min): 75 min    Short Term Goals: Week 1:  OT Short Term Goal 1 (Week 1): Pt will be able to sit to EOB with mod A of 1. OT Short Term Goal 1 - Progress (Week 1): Progressing toward goal OT Short Term Goal 2 (Week 1): Pt will be able to maintain sitting balance EOB with mod A of 1. OT Short Term Goal 2 - Progress (Week 1): Met OT Short Term Goal 3 (Week 1): Pt will bathe UB with mod A. OT Short Term Goal 3 - Progress (Week 1): Progressing toward goal OT Short Term Goal 4 (Week 1): Pt will don shirt with mod A. OT Short Term Goal 4 - Progress (Week 1): Progressing toward goal OT Short Term Goal 5 (Week 1): Pt will be able to find items in his R visual field with min cues. OT Short Term Goal 5 - Progress (Week 1): Progressing toward goal Week 2:  OT Short Term Goal 1 (Week 2): Pt will don shirt with Mod A OT Short Term Goal 1 - Progress (Week 2): Not met OT Short Term Goal 2 (Week 2): Pt will complete toilet transfer with LRAD and +2 assist to work on OOB toileting OT Short Term Goal 2 - Progress (Week 2): Met OT Short Term Goal 3 (Week 2): Pt will bathe UB with Mod A OT Short Term Goal 3 - Progress (Week 2): Met Week 3:  OT Short Term Goal 1 (Week 3): Pt will don shirt with Mod A. OT Short Term Goal 2 (Week 3): Pt will maintain dynamic sitting balance EOB with min assist during UB bathing tasks. OT Short Term Goal 3 (Week 3): Pt will complete sit to stand with total +2 (pt 30%) during LB bathing and dressing tasks. OT Short Term Goal 4 (Week 3): Pt will complete sliding board transfer with total assist +2 (pt 30%) to a drop arm commode.      Skilled Therapeutic Interventions/Progress Updates:    Pt seen this session to facilitate OOB toileting using a Biochemist, clinical. Pt received in  bed, alert and ready to participate.  His brief was wet, but waited to doff until we were at the [facility].  Pt rolled to R with mod A, sidelying to sit with max, held static balance with LUE support with min.  Used waist sling with Clarise Cruz lift to move pt from bed to [facility].  He sat on BSC to engage in bathing with mod A holding balance on BSC.  Max with UB dressing due to limited neck ROM and heavy arm.  Pt had not had a BM so lifted pt into stand with lift and then he began to have a very loose stool over BSC. Pt cleansed and new brief donned and pt transferred to wc.  Total A with TEds, socks, pant over feet.  Positioned pt at EOB in his wc so he could use bed rail for LUE support.  With A from his NT, pt needed +2 total A to partially rise to stand to finish pulling pants over hips with total A.  Pt adjusted in wc, worked on Athalia. No volitional movement noted but therapist did observe some activity in shoulder. Shared this with the patient and he said "oh really? It moved?"  Pt adjusted with  pillows to support R arm, belt alarm and call light in reach.      Therapy Documentation Precautions:  Precautions Precautions: Fall Precaution Comments: R hemiplegia with subluxation Restrictions Weight Bearing Restrictions: No   Pain: Pain Assessment Pain Scale: 0-10 Pain Score: 0-No pain   Therapy/Group: Individual Therapy  [place] 07/05/2020, 12:17 PM

## 2020-07-05 NOTE — Progress Notes (Addendum)
[place] PHYSICAL MEDICINE & REHABILITATION PROGRESS NOTE   Subjective/Complaints: Per therapy making slow but steady progress, endurance is improving  TOlerating TF boluses ROS: Patient denies CP, SOB, N/V/D   Objective:   No results found. No results for input(s): WBC, HGB, HCT, PLT in the last 72 hours. No results for input(s): NA, K, CL, CO2, GLUCOSE, BUN, CREATININE, CALCIUM in the last 72 hours. No intake or output data in the 24 hours ending 07/05/20 0752      Physical Exam: Vital Signs Blood pressure 138/75, pulse 72, temperature 98 F (36.7 C), temperature source Oral, resp. rate 18, height 6\' 2"  (1.88 m), weight 94 kg, SpO2 94 %.    General: No acute distress Mood and affect are appropriate Heart: Regular rate and rhythm no rubs murmurs or extra sounds Lungs: Clear to auscultation, breathing unlabored, no rales or wheezes Abdomen: Positive bowel sounds, soft nontender to palpation, nondistended Extremities: No clubbing, cyanosis, or edema Skin: No evidence of breakdown, no evidence of rash  Musc: Right hand/wrist edema with mild TTP.  No tenderness in extremities. Neuro: lethargic but able to communicate with dysarthria Clonus RIght wrist flexors increased tone RIght wrist pronators 2- Right ankle DF but not able to reproduce  Severe dysarthria ongoing Motor: RUE/RLE: 0/5 proximal distal, Except trace R hip add and finger flexion but not able to perform consistently --wearing WHO  Assessment/Plan: 1. Functional deficits which require 3+ hours per day of interdisciplinary therapy in a comprehensive inpatient rehab setting.  Physiatrist is providing close team supervision and 24 hour management of active medical problems listed below.  Physiatrist and rehab team continue to assess barriers to discharge/monitor patient progress toward functional and medical goals  Care Tool:  Bathing    Body parts bathed by patient: Chest,Abdomen,Front perineal area,Right  upper leg,Left upper leg,Face,Right arm   Body parts bathed by helper: Left arm,Right lower leg,Left lower leg,Buttocks     Bathing assist Assist Level: Maximal Assistance - Patient 24 - 49% (supine to supine with HOB elevated)     Upper Body Dressing/Undressing Upper body dressing   What is the patient wearing?: Pull over shirt    Upper body assist Assist Level: Maximal Assistance - Patient 25 - 49%    Lower Body Dressing/Undressing Lower body dressing      What is the patient wearing?: Incontinence brief,Pants     Lower body assist Assist for lower body dressing: Total Assistance - Patient < 25% (supine rolling)     Toileting Toileting    Toileting assist Assist for toileting: 2 Helpers     Transfers Chair/bed transfer  Transfers assist  Chair/bed transfer activity did not occur: Safety/medical concerns  Chair/bed transfer assist level: Total Assistance - Patient < 25%     Locomotion Ambulation   Ambulation assist   Ambulation activity did not occur: Safety/medical concerns          Walk 10 feet activity   Assist  Walk 10 feet activity did not occur: Safety/medical concerns        Walk 50 feet activity   Assist Walk 50 feet with 2 turns activity did not occur: Safety/medical concerns         Walk 150 feet activity   Assist Walk 150 feet activity did not occur: Safety/medical concerns         Walk 10 feet on uneven surface  activity   Assist Walk 10 feet on uneven surfaces activity did not occur: Safety/medical concerns  Wheelchair     Assist Will patient use wheelchair at discharge?: Yes Type of Wheelchair: Manual (TBD) Wheelchair activity did not occur: Safety/medical concerns         Wheelchair 50 feet with 2 turns activity    Assist    Wheelchair 50 feet with 2 turns activity did not occur: Safety/medical concerns       Wheelchair 150 feet activity     Assist  Wheelchair 150 feet activity  did not occur: Safety/medical concerns       Blood pressure 138/75, pulse 72, temperature 98 F (36.7 C), temperature source Oral, resp. rate 18, height 6\' 2"  (1.88 m), weight 94 kg, SpO2 94 %.    Medical Problem List and Plan: 1.  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 fleor Withdrawal vs voluntary  PEG feeds converted to bolus feeds per dietician  WHO/PRAFO ordered 2.  Antithrombotics: -DVT/anticoagulation: SCDs             -antiplatelet therapy: research study pt either on ASA or apixaban 3. Pain Management: Continue Tylenol as needed.   Mild RIght Wrist pain improved 1/11  Without physical exam finding , may be tone related, now has clonus right wrist flexors ,monitor cont OT 4. Mood: Provide emotional support             -antipsychotic agents: N/A 5. Neuropsych: This patient is capable of making decisions on his own behalf. 6. Skin/Wound Care: Routine skin checks 7. Fluids/Electrolytes/Nutrition: Routine I/O's   BMP within acceptable range on 12/24  8.    Post stroke dysphagia.   NPO,PEG feeds   9.  Atrial fibrillation.  Eliquis discontinued due to ICH.  Cardiac rate controlled.  Continue beta-blocker  Rate controlled on 1/11 10.  Hypertension.  Norvasc 10 mg daily. Increased Lopressor to 25mg  TID.   Lisinopril 2.5mg  on 12/27.   Monitor with increased mobility Vitals:   07/04/20 1952 07/05/20 0338  BP: 115/82 138/75  Pulse: 88 72  Resp: 18 18  Temp:  98 F (36.7 C)  SpO2: 95% 94%   Controlled on 1/11 11.  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  Contact precautions 12.  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  Hemoglobin A1c 6.1.  SSI.  CBG (last 3)  Recent Labs    07/04/20 1650 07/04/20 2145  07/05/20 0638  GLUCAP 188* 146* 115*    Controlled fairly well with bolus feeds 13.  GERD.  Continue Protonix 14. Hypomagnesemia: Started magnesium gluconate 250mg  daily  Magnesium 1.6 on 12/25  And 1/3  LOS: 19 days A FACE TO FACE EVALUATION WAS PERFORMED  09/02/20 07/05/2020, 7:52 AM

## 2020-07-05 NOTE — Progress Notes (Signed)
Physical Therapy Session Note  Patient Details  Name: [name] MRN: [id number] Date of Birth: [birth year]  Today's Date: 07/05/2020 PT Individual Time: 4098-1191 PT Individual Time Calculation (min): 45 min   Short Term Goals: Week 2:  PT Short Term Goal 1 (Week 2): Pt will perform sit<>stand with max assist of 1. PT Short Term Goal 1 - Progress (Week 2): Met PT Short Term Goal 2 (Week 2): Pt will transfer to [facility] in [place] with +2 assis to force WB through RLE PT Short Term Goal 2 - Progress (Week 2): Partly met PT Short Term Goal 3 (Week 2): Pt will maintain sitting balacne EOB with supervision assist while engaged in dynamic reaching task PT Short Term Goal 3 - Progress (Week 2): Not met Week 3:  PT Short Term Goal 1 (Week 3): Pt will transfer to to [facility] with max assist of 1 PT Short Term Goal 2 (Week 3): Pt will ambulate 5 ft with max A +2 to force use of RLE PT Short Term Goal 3 (Week 3): Pt will propel WC 100 ft with min assist using Hemi technique  Skilled Therapeutic Interventions/Progress Updates:    Patient supine in bed upon PT arrival. Patient takes time to wake from sleep and requires additional time for full alertness. Then is agreeable to PT session. Patient denied pain during session.  Therapeutic Activity: Bed Mobility: Patient performed bed rolling in order to perform pericare and change LB clothing as he is soiled from UI. Dressing and pericare performed Max A for time. He is able to follow instructions throughout and completes roll to R side with Min A to complete roll of hips to sidelying. Self initiates RLE flexion with CGA for improved BLE positioning for transition to side. Continues to require Max A for body positioning and to complete roll to L side.   Neuromuscular Re-ed: NMR facilitated while supine with focus on proprioception and appropriate muscle activation/ control. Supine clamshell activity with pt demonstrating improved strength in adduction/ IR for pressing  knees together and hold knees in hooklying position. VC/ tc and muscle tapping provided for RLE to control movement out to ~60deg abducted, holding position and Min A to return to neutral. RLE extension performed with this PT's R hand under heel to remove friction from grip sock and gauge motor control with pt able to extend with no physical assist. In return to 90/90 hip/ knee flexion, he demos muscle activation but requires Max A to complete. Bridging completed with vc/ tc for technique and Min A for full lift from bed surface, completed 5x.   Pt observed to flex R knee during bed mobility voluntarily and correctly several times.  Patient supine in bed at end of session with bed alarm set, and all needs within reach.    Therapy Documentation Precautions:  Precautions Precautions: Fall Precaution Comments: R hemiplegia with subluxation Restrictions Weight Bearing Restrictions: No    Therapy/Group: Individual Therapy  [name] 07/05/2020, 5:20 PM

## 2020-07-05 NOTE — Progress Notes (Signed)
Physical Therapy Session Note  Patient Details  Name: [name] MRN: [id number] Date of Birth: Jun 28, 1940  Today's Date: 07/05/2020 PT Individual Time: 1145-1210 PT Individual Time Calculation (min): 25 min   Short Term Goals: Week 3:  PT Short Term Goal 1 (Week 3): Pt will transfer to to [facility] with max assist of 1 PT Short Term Goal 2 (Week 3): Pt will ambulate 5 ft with max A +2 to force use of RLE PT Short Term Goal 3 (Week 3): Pt will propel WC 100 ft with min assist using Hemi technique  Skilled Therapeutic Interventions/Progress Updates:     Patient in TIS w/c asleep upon PT arrival. Patient easily aroused and agreeable to PT session. Patient denied pain during session, reported R upper extremity tightness with PROM.   Patient verbalized that he has no awareness of R upper and lower extremity motor control during session. No volitional R upper extremity movement throughout session, however palpable and observable muscle activation with functional transfer using Sara Plus during session. Provided education on R inattention and lower extremity muscle activation, as well as stroke recovery during session. Patient continues to present with dysarthric speech requiring increased time for communication throughout session.   Therapeutic Activity: Bed Mobility: Patient performed sit to supine with max A due to fatigue. Provided verbal cues for performing side-lying to supine for improved trunk control and bringing knees to chest to lift lower extremities onto the bed. Transfers: Patient performed transfer TIS w/c>bed with dependent assist using [place] Plus. Completed full stand during transfer with total A for R upper extremity management and facilitation for gluteal and quad activation during stand. Provided verbal cues for forward weight shift, lower extremity muscle activation and control, and erect posture during transfer.  Patient in bed with HOB elevated at end of session with breaks locked,  bed alarm set, and all needs within reach.    Therapy Documentation Precautions:  Precautions Precautions: Fall Precaution Comments: R hemiplegia with subluxation Restrictions Weight Bearing Restrictions: No   Therapy/Group: Individual Therapy  Rania Prothero L Miracle Mongillo PT, DPT  07/05/2020, 7:37 PM

## 2020-07-06 ENCOUNTER — Inpatient Hospital Stay (HOSPITAL_COMMUNITY): Payer: Medicare Other | Admitting: Occupational Therapy

## 2020-07-06 ENCOUNTER — Encounter (HOSPITAL_COMMUNITY): Payer: Medicare Other | Admitting: Speech Pathology

## 2020-07-06 ENCOUNTER — Inpatient Hospital Stay (HOSPITAL_COMMUNITY): Payer: Medicare Other | Admitting: Physical Therapy

## 2020-07-06 ENCOUNTER — Inpatient Hospital Stay (HOSPITAL_COMMUNITY): Payer: Medicare Other

## 2020-07-06 LAB — GLUCOSE, CAPILLARY
Glucose-Capillary: 123 mg/dL — ABNORMAL HIGH (ref 70–99)
Glucose-Capillary: 110 mg/dL — ABNORMAL HIGH (ref 70–99)
Glucose-Capillary: 112 mg/dL — ABNORMAL HIGH (ref 70–99)
Glucose-Capillary: 119 mg/dL — ABNORMAL HIGH (ref 70–99)

## 2020-07-06 NOTE — Progress Notes (Signed)
Physical Therapy Session Note  Patient Details  Name: [name] MRN: [id number] Date of Birth: 08-21-1940  Today's Date: 07/06/2020 PT Individual Time: 6767-2094 PT Individual Time Calculation (min): 57 min   Short Term Goals: Week 2:  PT Short Term Goal 1 (Week 2): Pt will perform sit<>stand with max assist of 1. PT Short Term Goal 1 - Progress (Week 2): Met PT Short Term Goal 2 (Week 2): Pt will transfer to [facility] in [place] with +2 assis to force WB through RLE PT Short Term Goal 2 - Progress (Week 2): Partly met PT Short Term Goal 3 (Week 2): Pt will maintain sitting balacne EOB with supervision assist while engaged in dynamic reaching task PT Short Term Goal 3 - Progress (Week 2): Not met Week 3:  PT Short Term Goal 1 (Week 3): Pt will transfer to to [facility] with max assist of 1 PT Short Term Goal 2 (Week 3): Pt will ambulate 5 ft with max A +2 to force use of RLE PT Short Term Goal 3 (Week 3): Pt will propel WC 100 ft with min assist using Hemi technique  Skilled Therapeutic Interventions/Progress Updates:    Patient supine in bed upon PT arrival. Patient alert and agreeable to PT session. Patient denied pain during session.  Therapeutic Activity: Bed Mobility: Patient performed supine to/from sit with Max A for RLE to EOB and Mod/ max A for UB with pt initiating push from bed and requiring physical assist to complete. Provided verbal cues for bringing hips to EOB, and pushing up from bed with LUE. For return to supine, pt is able to lower UB to bed surface with Min A with vc for technique and slow, controlled movement and BLE required Max A. Scoot forward and back on bed as well as in w/c req'd LUE assist and vc for weight shifting/ lateral leaning for appropriate ischial tuberosity clearance and shift as well as for initiation of movement. Pt demonstrates ability to perform initially with Mod A and improves to Min A for balance with good initiation and controlled follow through to  complete.  Transfers: Patient performed sit to/from stand at hallway HR to LUE with Max A of 1 person. Provided verbal cues and max encouragement for glute squeeze, forward lean, and appropriate muscle activation. Squat pivot transfer bed -->w/c at start of session with max A of 1 person and vc for initiating movements. Return to bed at end of session req'd Max A +2 d/t fatigue.   Gait Training:  Patient ambulated 6 steps using LHR in hallway with Max A +2 for w/c follow. Max A with v/c for weight shift to L and near total A for R foot placement, and max A for weight shift to RLE with knee block and pt able to advance LLE in small increments.  Wheelchair Mobility:  Patient propelled wheelchair >100 feet using LUE to propel and vc provided for using L foot to steer but requiring Min A to adjust for maintaining straight path.  Neuromuscular Re-ed: NMR facilitated during standing at hallway rail with use of mirror for visual feedback of posture. VC provided throughout standing and gait for upright posture with good correction from pt but fatigue playing role in maintenance of posture. Pt also demonstrating improved motor control LLE extension with vc. Improved ability to maintain midline posture during sitting demonstrated throughout session with minimal cueing required. Throughout bed mobility for bed positioning, he is able to initiate and hold leg positioning in order to physically  assist. He is also able to adjust hips and shoulders with CGA for neutral positioning in bed.   Patient supine in bed at end of session with bed alarm set, and all needs within reach.    Therapy Documentation Precautions:  Precautions Precautions: Fall Precaution Comments: R hemiplegia with subluxation Restrictions Weight Bearing Restrictions: No    Therapy/Group: Individual Therapy  [name] 07/06/2020, 4:37 PM

## 2020-07-06 NOTE — Progress Notes (Signed)
Occupational Therapy Session Note  Patient Details  Name: [name] MRN: [id number] Date of Birth: 09/26/1940  Today's Date: 07/06/2020 OT Individual Time: 1030-1100 and 1230- 1300 OT Individual Time Calculation (min): 30 min and 30 min   Short Term Goals: Week 3:  OT Short Term Goal 1 (Week 3): Pt will don shirt with Mod A. OT Short Term Goal 2 (Week 3): Pt will maintain dynamic sitting balance EOB with min assist during UB bathing tasks. OT Short Term Goal 3 (Week 3): Pt will complete sit to stand with total +2 (pt 30%) during LB bathing and dressing tasks. OT Short Term Goal 4 (Week 3): Pt will complete sliding board transfer with total assist +2 (pt 30%) to a drop arm commode.  Skilled Therapeutic Interventions/Progress Updates:    Visit 1: No c/o pain Pt received in bed in a soaked brief.  Pt worked on rolling in bed with mod A to R and max to L with cues on how to support his R arm.  Pt cleansed and brief changed, TEDs and pants donned. Pt able to assist by cleansing front perineal area.  Pt positioned upright in bed, donned shirt with mod A today as it was looser fitting and easier to don.  RUE PROM and positioning with support.  Pt in bed with alarm set and all needs met.  Visit 2: No c/o pain Pt seen this session to work on self feeding with his first trial of a Dys 1 diet.  Pt did extremely well today!  No coughing or pocketing. Followed cues perfectly to take small bites and ensure he had a good swallow before next bite.  He scanned the tray and found items on R side with no cuing.  Pt joking about the food and the taste but said, "you know, overall it is not that bad".  He found the eating process quite fatiguing and just got tired.  He was wondering if he had to eat everything. Encouraged pt to go at his own pace and stop when full.  he did eat 50% of the chicken, only a bite of the mac and cheese and brocolli.  10-20% of the pinaeapple, ice tea, and magic cup. Pt resting  with all needs met and alarm set.   Therapy Documentation Precautions:  Precautions Precautions: Fall Precaution Comments: R hemiplegia with subluxation Restrictions Weight Bearing Restrictions: No   Pain: Pain Assessment Pain Scale: 0-10 Pain Score: 0-No pain ADL: ADL Eating: Maximal assistance Grooming: Maximal assistance Upper Body Bathing: Moderate assistance Where Assessed-Upper Body Bathing: Bed level Lower Body Bathing: Moderate assistance Where Assessed-Lower Body Bathing: Bed level Upper Body Dressing: Maximal assistance Where Assessed-Upper Body Dressing: Bed level Lower Body Dressing: Dependent Where Assessed-Lower Body Dressing: Bed level Toileting: Not assessed Toilet Transfer: Not assessed   Therapy/Group: Individual Therapy  [place] 07/06/2020, 10:04 AM

## 2020-07-06 NOTE — Progress Notes (Signed)
[place] PHYSICAL MEDICINE & REHABILITATION PROGRESS NOTE   Subjective/Complaints: No issues overnite, pt denies abd pain, wearing R PRAFO boot  ROS: Patient denies CP, SOB, N/V/D   Objective:   No results found. No results for input(s): WBC, HGB, HCT, PLT in the last 72 hours. No results for input(s): NA, K, CL, CO2, GLUCOSE, BUN, CREATININE, CALCIUM in the last 72 hours. No intake or output data in the 24 hours ending 07/06/20 0756      Physical Exam: Vital Signs Blood pressure 110/66, pulse 74, temperature 98.2 F (36.8 C), resp. rate 18, height $RemoveBe'6\' 2"'KfdwItBmc$  (1.88 m), weight 96.3 kg, SpO2 96 %.   General: No acute distress Mood and affect are appropriate Heart: Regular rate and rhythm no rubs murmurs or extra sounds Lungs: Clear to auscultation, breathing unlabored, no rales or wheezes Abdomen: Positive bowel sounds, soft nontender to palpation, nondistended Extremities: No clubbing, cyanosis, or edema Skin: No evidence of breakdown, no evidence of rash- Peg site has ~1cm area of erythema lateral to ostomy site   Musc: Right hand/wrist edema with mild TTP.  No tenderness in extremities. Neuro: lethargic but able to communicate with dysarthria Clonus RIght wrist flexors increased tone RIght wrist pronators 2- Right ankle DF but not able to reproduce  Severe dysarthria ongoing Motor: RUE/RLE: 0/5 proximal distal, Except trace R hip add and finger flexion but not able to perform consistently --wearing WHO  Assessment/Plan: 1. Functional deficits which require 3+ hours per day of interdisciplinary therapy in a comprehensive inpatient rehab setting.  Physiatrist is providing close team supervision and 24 hour management of active medical problems listed below.  Physiatrist and rehab team continue to assess barriers to discharge/monitor patient progress toward functional and medical goals  Care Tool:  Bathing    Body parts bathed by patient: Chest,Abdomen,Front perineal  area,Right upper leg,Left upper leg,Face,Right arm   Body parts bathed by helper: Left arm,Right lower leg,Left lower leg,Buttocks     Bathing assist Assist Level: Moderate Assistance - Patient 50 - 74% (sitting on BSC)     Upper Body Dressing/Undressing Upper body dressing   What is the patient wearing?: Pull over shirt    Upper body assist Assist Level: Maximal Assistance - Patient 25 - 49%    Lower Body Dressing/Undressing Lower body dressing      What is the patient wearing?: Incontinence brief,Pants     Lower body assist Assist for lower body dressing: 2 Helpers     Toileting Toileting    Toileting assist Assist for toileting: 2 Helpers (sara lift)     Transfers Chair/bed transfer  Transfers assist  Chair/bed transfer activity did not occur: Safety/medical concerns  Chair/bed transfer assist level: Total Assistance - Patient < 25%     Locomotion Ambulation   Ambulation assist   Ambulation activity did not occur: Safety/medical concerns          Walk 10 feet activity   Assist  Walk 10 feet activity did not occur: Safety/medical concerns        Walk 50 feet activity   Assist Walk 50 feet with 2 turns activity did not occur: Safety/medical concerns         Walk 150 feet activity   Assist Walk 150 feet activity did not occur: Safety/medical concerns         Walk 10 feet on uneven surface  activity   Assist Walk 10 feet on uneven surfaces activity did not occur: Safety/medical concerns  Wheelchair     Assist Will patient use wheelchair at discharge?: Yes Type of Wheelchair: Manual (TBD) Wheelchair activity did not occur: Safety/medical concerns         Wheelchair 50 feet with 2 turns activity    Assist    Wheelchair 50 feet with 2 turns activity did not occur: Safety/medical concerns       Wheelchair 150 feet activity     Assist  Wheelchair 150 feet activity did not occur: Safety/medical  concerns       Blood pressure 110/66, pulse 74, temperature 98.2 F (36.8 C), resp. rate 18, height 6\' 2"  (1.88 m), weight 96.3 kg, SpO2 96 %.    Medical Problem List and Plan: 1.  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 Formulized individual treatment plan based on medical history, underlying problem and comorbidities.  PEG feeds converted to bolus feeds per dietician- tolerating well   WHO/PRAFO ordered 2.  Antithrombotics: -DVT/anticoagulation: SCDs             -antiplatelet therapy: research study pt either on ASA or apixaban 3. Pain Management: Continue Tylenol as needed.   Mild RIght Wrist pain improved 1/11  Without physical exam finding , may be tone related, now has clonus right wrist flexors ,monitor cont OT 4. Mood: Provide emotional support             -antipsychotic agents: N/A 5. Neuropsych: This patient is capable of making decisions on his own behalf. 6. Skin/Wound Care: Routine skin checks 7. Fluids/Electrolytes/Nutrition: Routine I/O's   BMP within acceptable range on 12/24  8.    Post stroke dysphagia.   NPO,PEG feeds   9.  Atrial fibrillation.  Eliquis discontinued due to [place].  Cardiac rate controlled.  Continue beta-blocker  Rate controlled on 1/11 10.  Hypertension.  Norvasc 10 mg daily. Increased Lopressor to 25mg  TID.   Lisinopril 2.5mg  on 12/27.   Monitor with increased mobility Vitals:   07/05/20 1915 07/06/20 0520  BP: 129/74 110/66  Pulse: 77 74  Resp: 16 18  Temp: 98.2 F (36.8 C)   SpO2: 96% 96%   Controlled on 1/12 11.  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  Contact precautions 12.  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  Hemoglobin A1c 6.1.  SSI.  CBG (last 3)  Recent Labs    07/05/20 1721 07/05/20 2051 07/06/20 0621  GLUCAP 104* 110* 123*    Controlled fairly well with bolus feeds 13.  GERD.  Continue Protonix 14. Hypomagnesemia: Started magnesium gluconate 250mg  daily  Magnesium 1.6 on 12/25  And 1/3  LOS: 20 days A FACE TO FACE EVALUATION WAS PERFORMED  Charlett Blake 07/06/2020, 7:56 AM

## 2020-07-06 NOTE — Progress Notes (Signed)
Modified Barium Swallow Progress Note  Patient Details  Name: [name] MRN: [id number] Date of Birth: 06-Dec-1940  Today's Date: 07/06/2020  Modified Barium Swallow completed.  Full report located under Chart Review in the Imaging Section.  Brief recommendations include the following:  Clinical Impression  Pt presents with moderate oral and pharyngeal dysphagia that appears primarily due to facial, oral, and pharyngeal weakness, decreased timing/, and ncomplete laryngeal vestibule closure. Although first trial of honey thick barium resulted in silent aspiration (PAS score 8) due to delayed timing in deflection of the epiglottis and incomplete closure of the laryngeal vestibule, no further penetration or aspiration events occurred throughout several teaspoon and self-fed cup sips of honey. Pt with less oral control of thinner boluses, as well decreased airway protection, resulting in aspiration X1 and deep penetration X1 of nectar (PAS scores 5 and 8). Puree textures (dysphagia 1) were consumed with overall weak oral manipulation and slightly reduced posterior propulsion, but functional oral transit and no airway intrusion. Pt's mastication of dysphagia 3 (mechanical soft) textures was significantly prolonged, and he also exhibited lingual residue and increased difficulty with bolus cohesion and posterior propulsion. Due to oral phase deficits, pt also demonstrated difficulty consuming barium pill in applesauce as 1 cohesive bolus, requiring extra time and additional presentations of applesauce to clear the pill from his oral cavity. A brief scan of the esophagus was unremarkable. Recommend pt begin a dysphagia 1 (puree) texture diet with honey thick liquids, medications may be crushed in applesauce or pudding. Pt should have full supervision during intake. ST will continue to provide skilled interventions to ensure diet safety and efficiency while inpatient.   Swallow Evaluation Recommendations        SLP Diet Recommendations: Honey thick liquids;Dysphagia 1 (Puree) solids   Liquid Administration via: Cup   Medication Administration: Crushed with puree   Supervision: Staff to assist with self feeding;Full supervision/cueing for compensatory strategies   Compensations: Slow rate;Small sips/bites   Postural Changes: Seated upright at 90 degrees   Oral Care Recommendations: Oral care BID   Other Recommendations: Order thickener from pharmacy;Prohibited food (jello, ice cream, thin soups);Remove water pitcher    [name] 07/06/2020,9:58 AM

## 2020-07-06 NOTE — Progress Notes (Signed)
Physical Therapy Session Note  Patient Details  Name: [name] MRN: [id number] Date of Birth: [birth year]  Today's Date: 07/06/2020 PT Individual Time: 6389-3734 PT Individual Time Calculation (min): 53 min   Short Term Goals: Week 3:  PT Short Term Goal 1 (Week 3): Pt will transfer to to [facility] with max assist of 1 PT Short Term Goal 2 (Week 3): Pt will ambulate 5 ft with max A +2 to force use of RLE PT Short Term Goal 3 (Week 3): Pt will propel WC 100 ft with min assist using Hemi technique  Skilled Therapeutic Interventions/Progress Updates:    Pt received supine in bed and reports feeling "tired" but agreeable to therapy session. Noted to be incontinent of bladder. Rolling R in bed with min assist using L UE support on bedrail and rolling L with mod/max assist requiring increased assist for R hemibody management and trunk/pelvic rotation - therapist cuing throughout for R LE activation and engagement in task to achieve hooklying position - pt demos some muscle activation but only able to initiate moving R LE requiring total assist for repositioning. Total assist LB clothing management and peri-care to don clean brief, pants, and slipper shoes. Therapist educated pt on importance of rolling schedule and sidelying for pressure relief when in the bed. Supine>sitting R EOB via logroll technique, HOB flat and using bedrail, with max assist for B LE management and trunk upright - cuing for sequencing to increase pt participation and independence with task. Sitting EOB reports dizziness that dissipated with sitting rest break - pt wearing B LE knee high TED hose and reports this symptom has not occurred in a long time. Sitting EOB demos consistent R lateral trunk lean and upon therapist facilitating R LE repositioning into increased hip external rotation to place foot more firmly on the ground for WBing it caused sudden R lateral trunk LOB indicating likely tightness of hip internal rotator musculature. L  lateral scoot transfer EOB>w/c using transfer board, required max assist for trunk control during R lateral lean while +2 assist placed board, completed L lateral scoot with +2 max assist, cuing for increased anterior trunk lean and head/hips relationship with pt already having excessive R lateral trunk lean requiring assist for trunk control during transfer.  Transported to/from gym in w/c for time management and energy conservation. Pt reports he is fatigued but would prefer standing as opposed to getting on EOM stating standing "feels good." Sit>stand TIS w/c>L UE support on hallway rail 2x with heavy max assist of 1 for lifting to stand while blocking R knee and manually facilitating trunk/hip/knee extension - mirror feedback provided for improved upright posture with cuing and facilitation for improvement - demos L lateral weight shift/lean in standing requiring cuing and facilitation for improved midline orientation - tolerated standing ~30-45seconds each time. Transported back to room and pt requesting to return to bed. R lateral scoot w/c>EOB using transfer board with mod assist to facilitate L lean and lift R LE while +2 assist placed board, +2 max assist for scooting hips, continued cuing for increased anterior trunk lean and head/hips relationship. Sit>supine with +2 max assist for trunk descent and B LE management into bed. Pt left supine in bed with needs in reach, HOB elevated, bed alarm on, and R UE support on pillows for edema management and to promote proper joint alignment.  Therapy Documentation Precautions:  Precautions Precautions: Fall Precaution Comments: R hemiplegia with subluxation Restrictions Weight Bearing Restrictions: No  Pain:   No reports  of pain during session.   Therapy/Group: Individual Therapy  Ginny Forth , PT, DPT, CSRS  07/06/2020, 3:48 PM

## 2020-07-06 NOTE — Patient Care Conference (Signed)
Inpatient RehabilitationTeam Conference and Plan of Care Update Date: 07/06/2020   Time: 10:04 AM    Patient Name: [name]      Medical Record Number: [id number]  Date of Birth: 11-02-1940 Sex: Male         Room/Bed: 4W21C/4W21C-01 Payor Info: Payor: MEDICARE / Plan: MEDICARE PART A AND B / Product Type: *No Product type* /    Admit Date/Time:  06/16/2020  4:24 PM  Primary Diagnosis:  ICH (intracerebral hemorrhage) [facility])  Hospital Problems: Principal Problem:   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 Active Problems:   Hypomagnesemia   Prediabetes   Essential hypertension   Dysphagia, post-stroke   Right hemiplegia [facility])    Expected Discharge Date: Expected Discharge Date:  (SNF pending)  Team Members Present: Physician leading conference: Dr. Claudette Laws Care Coodinator Present: Chana Bode, RN, BSN, CRRN;Christina Elcho, BSW Nurse Present: Margot Ables, LPN PT Present: Grier Rocher, PT OT Present: Other (comment) Annye English, OT) SLP Present: Suzzette Righter, CF-SLP PPS Coordinator present : Fae Pippin, [place], PT     Current Status/Progress Goal Weekly Team Focus  Bowel/Bladder   incontinent x2 LBM 1/11  Regular BM every 3 days or less  assess toileting needs qshift and PRN   Swallow/Nutrition/ Hydration   Dys 1 textures, honey thick liquids, PEG tube, meds crushed  Supervision  tolearnce current diet, increasing PO intake to hopefully reduce dependence on PEG   ADL's   mod bathing sitting on BSC; LB dressing +2 total, UB max; using sara mechanical lift to [facility] with +2 total A  min A - goals need to be downgraded to mod -max  ADL training, RUE NMR, postural control   Mobility             Communication   Moderate dysarthria, Min A  Supervision  compensatory strategies for speech intelligibility   Safety/Cognition/ Behavioral Observations  Min A  Supervision  memory strateties, selective attention    Pain   denies pain  remain free of pain  assess apin qshift and PRN   Skin   no skin impairments  no new breakdown  assess skin qshift and PRN     Discharge Planning:  Discharge to SNF   Team Discussion: Patient tolerating tube feedings well via PEG.. BP stable. Incontinent of bowel and bladder. Slow progress noted. SLP noted pharyngeal and oral improvement following strategies for dysarthria and communication. Patient on target to meet rehab goals: no, PT and OT downgraded goals to mod- max assist overall. Currently mod - max assist with bathing and dressing. Bed level transfers improving and able to propel TIS wheelchair with left side.  *See Care Plan and progress notes for long and short-term goals.   Revisions to Treatment Plan:  Downgraded goals for discharge  Teaching Needs: Transfers, toileting, medications, etc  Current Barriers to Discharge: Decreased caregiver support, Home enviroment access/layout and Nutritional means  Possible Resolutions to Barriers:  SNF recommended    Medical Summary Current Status: tolerating PEG feeds, boluses, intermittent lethargy, incont bowel adn bladder  Barriers to Discharge: Incontinence;Decreased family/caregiver support   Possible Resolutions to [employer] Focus: Stable for SNF when bed available   Continued Need for Acute Rehabilitation Level of Care: The patient requires daily medical management by a physician with specialized training in physical medicine and rehabilitation for the following reasons: Direction of a multidisciplinary physical rehabilitation program to maximize functional independence : Yes Medical management of patient stability for increased activity during  participation in an intensive rehabilitation regime.: Yes Analysis of laboratory values and/or radiology reports with any subsequent need for medication adjustment and/or medical intervention. : Yes   I attest that I was present, lead the team  conference, and concur with the assessment and plan of the team.   Chana Bode B 07/06/2020, 1:34 PM

## 2020-07-06 NOTE — Progress Notes (Addendum)
Patient ID: [name], male   DOB: [birth year], 80 y.o.   MRN: [id number]   SW followed up with Docia Furl assisting with admissions from [facility] (family choice). She will give SW a call back with bed availability. [phone]  Lavera Guise, [place] [phone]

## 2020-07-06 NOTE — Plan of Care (Signed)
  Problem: RH Car Transfers Goal: LTG Patient will perform car transfers with assist (PT) Description: LTG: Patient will perform car transfers with assistance (PT). Outcome: Not Applicable   Problem: RH Bed Mobility Goal: LTG Patient will perform bed mobility with assist (PT) Description: LTG: Patient will perform bed mobility with assistance, with/without cues (PT). Flowsheets (Taken 07/06/2020 1004) LTG: Pt will perform bed mobility with assistance level of: Maximal Assistance - Patient 25 - 49%   Problem: RH Bed to Chair Transfers Goal: LTG Patient will perform bed/chair transfers w/assist (PT) Description: LTG: Patient will perform bed to chair transfers with assistance (PT). Flowsheets (Taken 07/06/2020 1004) LTG: Pt will perform Bed to Chair Transfers with assistance level: Maximal Assistance - Patient 25 - 49%   Problem: RH Ambulation Goal: LTG Patient will ambulate in controlled environment (PT) Description: LTG: Patient will ambulate in a controlled environment, # of feet with assistance (PT). Flowsheets Taken 07/06/2020 1004 by Golden Pop, PT LTG: Pt will ambulate in controlled environ  assist needed:: Maximal Assistance - Patient 25 - 49% Taken 06/17/2020 1715 by Peter Congo, PT LTG: Ambulation distance in controlled environment: 25 ft with LRAD   Problem: RH Wheelchair Mobility Goal: LTG Patient will propel w/c in controlled environment (PT) Description: LTG: Patient will propel wheelchair in controlled environment, # of feet with assist (PT) Flowsheets Taken 07/06/2020 1004 by Golden Pop, PT LTG: Propel w/c distance in controlled environment: 100 Taken 06/17/2020 1715 by Peter Congo, PT LTG: Pt will propel w/c in controlled environ  assist needed:: Supervision/Verbal cueing Goal: LTG Patient will propel w/c in home environment (PT) Description: LTG: Patient will propel wheelchair in home environment, # of feet with assistance (PT). Flowsheets Taken  07/06/2020 1004 by Golden Pop, PT LTG: Propel w/c distance in home environment: 3ft Taken 06/17/2020 1715 by Peter Congo, PT LTG: Pt will propel w/c in home environ  assist needed:: Supervision/Verbal cueing

## 2020-07-06 NOTE — Progress Notes (Signed)
Patient ID: [name], male   DOB: 11-24-1940, 80 y.o.   MRN: [id number] Team Conference Report to Patient/Family  Team Conference discussion was reviewed with the patient and caregiver, including goals, any changes in plan of care and target discharge date.  Patient and caregiver express understanding and are in agreement.  The patient has a target discharge date of  (SNF pending).  Andria Rhein 07/06/2020, 12:58 PM

## 2020-07-07 ENCOUNTER — Inpatient Hospital Stay (HOSPITAL_COMMUNITY): Payer: Medicare Other | Admitting: Physical Therapy

## 2020-07-07 ENCOUNTER — Inpatient Hospital Stay (HOSPITAL_COMMUNITY): Payer: Medicare Other | Admitting: Occupational Therapy

## 2020-07-07 ENCOUNTER — Inpatient Hospital Stay (HOSPITAL_COMMUNITY): Payer: Medicare Other | Admitting: Speech Pathology

## 2020-07-07 LAB — SARS CORONAVIRUS 2 (TAT 6-24 HRS): SARS Coronavirus 2: NEGATIVE

## 2020-07-07 LAB — GLUCOSE, CAPILLARY
Glucose-Capillary: 107 mg/dL — ABNORMAL HIGH (ref 70–99)
Glucose-Capillary: 109 mg/dL — ABNORMAL HIGH (ref 70–99)

## 2020-07-07 MED ORDER — RESOURCE THICKENUP CLEAR PO POWD
ORAL | Status: DC | PRN
  Filled 2020-07-07: qty 125

## 2020-07-07 MED ORDER — ACETAMINOPHEN 160 MG/5ML PO SOLN: 650.0000 mg | ORAL | 0 refills | Status: DC | PRN

## 2020-07-07 MED ORDER — METOPROLOL TARTRATE 50 MG PO TABS: 50.0000 mg | ORAL_TABLET | Freq: Three times a day (TID) | ORAL | Status: DC

## 2020-07-07 MED ORDER — STUDY - ASPIRE - APIXABAN 5 MG OR PLACEBO TABLET (PI-SETHI): 5.0000 mg | ORAL_TABLET | Freq: Two times a day (BID) | ORAL | 0 refills | Status: DC

## 2020-07-07 MED ORDER — STUDY - ASPIRE - ASPIRIN 81 MG OR PLACEBO TABLET (PI-SETHI)
81.0000 mg | ORAL_TABLET | Freq: Every day | ORAL | Status: DC
  Administered 2020-07-08: 81 mg
  Filled 2020-07-07: qty 1

## 2020-07-07 MED ORDER — METFORMIN HCL 500 MG PO TABS: 500.0000 mg | ORAL_TABLET | Freq: Every day | ORAL | Status: DC

## 2020-07-07 MED ORDER — LISINOPRIL 2.5 MG PO TABS: 2.5000 mg | ORAL_TABLET | Freq: Every day | ORAL | Status: DC

## 2020-07-07 MED ORDER — STUDY - ASPIRE - APIXABAN 5 MG OR PLACEBO TABLET (PI-SETHI)
5.0000 mg | ORAL_TABLET | Freq: Two times a day (BID) | ORAL | Status: DC
  Administered 2020-07-07 – 2020-07-08 (×2): 5 mg
  Filled 2020-07-07 (×2): qty 1

## 2020-07-07 MED ORDER — FREE WATER
100.0000 mL | Status: DC
  Administered 2020-07-07 – 2020-07-08 (×9): 100 mL

## 2020-07-07 MED ORDER — PANTOPRAZOLE SODIUM 40 MG PO PACK: 40.0000 mg | PACK | Freq: Every day | ORAL | Status: DC

## 2020-07-07 MED ORDER — MAGNESIUM GLUCONATE 500 MG PO TABS: 250.0000 mg | ORAL_TABLET | Freq: Every day | ORAL | Status: DC

## 2020-07-07 MED ORDER — STUDY - ASPIRE - ASPIRIN 81 MG OR PLACEBO TABLET (PI-SETHI): 81.0000 mg | ORAL_TABLET | Freq: Every day | ORAL | 0 refills | Status: DC

## 2020-07-07 MED ORDER — PROSOURCE TF PO LIQD
45.0000 mL | Freq: Three times a day (TID) | ORAL | Status: DC
Start: 2020-07-07 — End: 2021-01-24

## 2020-07-07 MED ORDER — OSMOLITE 1.5 CAL PO LIQD: 355.0000 mL | Freq: Three times a day (TID) | ORAL | 0 refills | Status: DC

## 2020-07-07 MED ORDER — AMLODIPINE BESYLATE 10 MG PO TABS: 10.0000 mg | ORAL_TABLET | Freq: Every day | ORAL | Status: DC

## 2020-07-07 MED ORDER — FREE WATER: 100.0000 mL | Status: DC

## 2020-07-07 NOTE — Progress Notes (Signed)
Patient ID: [name], male   DOB: [birth year], 80 y.o.   MRN: [id number]   Facility has requested patient transport be scheduled for 2/3PM tomorrow.  [place], [place] [phone]

## 2020-07-07 NOTE — Discharge Instructions (Signed)
Inpatient Rehab Discharge Instructions  Fadil Macmaster Discharge date and time: No discharge date for patient encounter.   Activities/Precautions/ Functional Status: Activity: activity as tolerated Diet: Dysphagia #1 honey thick liquids.  Osmolite 350 mL 3 times a day with meals and bedtime by tube.  Prosource 45 mL 3 times daily by tube.  Free water 100 mL every 4 hours Wound Care: Routine skin checks Functional status:  ___ No restrictions     ___ Walk up steps independently ___ 24/7 supervision/assistance   ___ Walk up steps with assistance ___ Intermittent supervision/assistance  ___ Bathe/dress independently ___ Walk with walker     _x__ Bathe/dress with assistance ___ Walk Independently    ___ Shower independently ___ Walk with assistance    ___ Shower with assistance ___ No alcohol     ___ Return to work/school ________  Special Instructions:  No driving smoking or alcohol   No further Eliquis until notified  My questions have been answered and I understand these instructions. I will adhere to these goals and the provided educational materials after my discharge from the hospital.  Patient/Caregiver Signature _______________________________ Date __________  Clinician Signature _______________________________________ Date __________  Please bring this form and your medication list with you to all your follow-up doctor's appointments.

## 2020-07-07 NOTE — Progress Notes (Signed)
Occupational Therapy Discharge Summary  Patient Details  Name: [name] MRN: [id number] Date of Birth: August 05, 1940  Today's Date: 07/07/2020 OT Individual Time: 5465-6812 OT Individual Time Calculation (min): 61 min    Patient has met 3 of 12 long term goals due to improved activity tolerance, improved balance, postural control, ability to compensate for deficits, improved attention and improved awareness.  Patient to discharge at overall Total Assist level.  Patient's care partner has not been present during therapy sessions to receive sufficient education to provide the necessary physical and cognitive assistance at discharge. Will req family education at next level of care.   Reasons goals not met: Pt discharged unexpectedly early from [place] 2/2 family preference and availability of SNF bed. Pt cont to primarily limited by decreased activity tolerance, decreased functional use of RUE, and decreased functional strength and AROM impairing ADL and functional mobility performance.  Recommendation:  Patient will benefit from ongoing skilled OT services in skilled nursing facility setting to continue to advance functional skills in the area of BADL and Reduce care partner burden.   Equipment: No equipment provided  Reasons for discharge: discharge from hospital  Patient/family agrees with progress made and goals achieved: Yes  OT Discharge Precautions/Restrictions  Precautions Precautions: Fall Precaution Comments: R hemiplegia with subluxation Restrictions Weight Bearing Restrictions: No Pain Pain Assessment Pain Scale: Faces Faces Pain Scale: Hurts a little bit Pain Type: Acute pain Pain Location: Elbow Pain Orientation: Right ADL ADL Eating: Supervision/safety Grooming: Supervision/safety Where Assessed-Grooming: Sitting at sink,Wheelchair Upper Body Bathing: Moderate assistance Where Assessed-Upper Body Bathing: Shower Lower Body Bathing: Moderate assistance Where  Assessed-Lower Body Bathing: Shower Upper Body Dressing: Moderate assistance Where Assessed-Upper Body Dressing: Wheelchair Lower Body Dressing: Dependent Where Assessed-Lower Body Dressing: Bed level Toileting: Dependent Where Assessed-Toileting: Bedside Commode Toilet Transfer: Dependent Toilet Transfer Method: Other (comment) (sara stedy) Toilet Transfer Equipment: Geophysical data processor: Dependent Social research officer, government Method: Other (comment) (sara stedy) Walk-In Shower Equipment: Other (comment) (BSC) Vision Baseline Vision/History: Wears glasses Wears Glasses: Reading only Patient Visual Report: Blurring of vision;Diplopia Vision Assessment?: Yes Eye Alignment: Within Functional Limits Ocular Range of Motion: Within Functional Limits Alignment/Gaze Preference: Within Defined Limits Tracking/Visual Pursuits: Able to track stimulus in all quads without difficulty Saccades: Within functional limits Convergence: Impaired (comment) (B eyes do not converge/diverge) Visual Fields: No apparent deficits Diplopia Assessment: Disappears with one eye closed;Present in far gaze Perception  Perception: Not tested Inattention/Neglect: Appears intact Praxis Praxis: Intact Cognition Overall Cognitive Status: Impaired/Different from baseline Arousal/Alertness: Awake/alert Orientation Level: Oriented X4 Attention: Selective;Sustained Sustained Attention: Appears intact Selective Attention: Appears intact Selective Attention Impairment: Functional complex;Verbal complex Memory: Impaired Memory Impairment: Decreased recall of new information;Decreased short term memory Awareness: Appears intact Problem Solving: Impaired Problem Solving Impairment: Functional complex Safety/Judgment: Appears intact Sensation Sensation Light Touch: Appears Intact Hot/Cold: Not tested Proprioception: Not tested Stereognosis: Appears Intact (LUE, RUE not tested 2/2 signifcant  hemi) Coordination Gross Motor Movements are Fluid and Coordinated: No Fine Motor Movements are Fluid and Coordinated: No Coordination and Movement Description: dense RUE hemiplegia - some movement in RLE Motor  Motor Motor: Hemiplegia;Abnormal tone Motor - Skilled Clinical Observations: very hypotonic RUE Motor - Discharge Observations: very hypotonic RUE Mobility  Bed Mobility Bed Mobility: Sit to Sidelying Right;Rolling Left Rolling Left: Moderate Assistance - Patient 50-74% Sit to Sidelying Right: Moderate Assistance - Patient 50-74%  Trunk/Postural Assessment  Cervical Assessment Cervical Assessment: Exceptions to [facility] (neck flexion) Thoracic Assessment Thoracic Assessment: Exceptions to [facility] (kyphotic  posture) Lumbar Assessment Lumbar Assessment: Exceptions to [facility] (posterior pelvic tilt) Postural Control Postural Control: Deficits on evaluation (mod R lean) Trunk Control: impaired with R lean  Balance Balance Balance Assessed: Yes Static Sitting Balance Static Sitting - Balance Support: Feet unsupported Static Sitting - Level of Assistance: 5: Stand by assistance Dynamic Sitting Balance Dynamic Sitting - Balance Support: Feet supported Dynamic Sitting - Level of Assistance: 3: Mod assist Extremity/Trunk Assessment RUE Assessment RUE Assessment: Exceptions to [facility] Passive Range of Motion (PROM) Comments: limited elbow flexion/extension - moderate edema Active Range of Motion (AROM) Comments: 0 General Strength Comments: 0/5, trace movement noted in shoulder RUE Body System: Neuro Brunstrum levels for arm and hand: Arm;Hand Brunstrum level for arm: Stage I Presynergy Brunstrum level for hand: Stage I Flaccidity LUE Assessment LUE Assessment: Within Functional Limits  Session Note: Pt received in TIS, agreeable to therapy. Completed discharge reassessments as documented above. Upon PROM of R elbow flexion, c/o of minimal pain in R elbow, ceased with activity cessation.  Pt completed seated grooming at sink with LUE with above assist level. Doffed shirt with mod A, doffed pants/brief with total A + 2 (pt 15%). Used sara stedy for sit<>stand transfer. Then transferred to shower via [place] stedy total A + 2 (pt 15%), bathed full-body with overall mod A, and doffed/donned brief with total A. Transferred back to bed via sara stedy and completed bed mobility as documented above. Pt left semi-reclined in bed with RUE supported by 2 pillows, bed alarm engaged, call bell in reach, all immediate needs met.   Volanda Napoleon, MS, OTR/L 07/07/2020, 4:02 PM

## 2020-07-07 NOTE — Progress Notes (Signed)
Physical Therapy Session Note  Patient Details  Name: [name] MRN: [id number] Date of Birth: [birth year]  Today's Date: 07/07/2020 PT Individual Time: 7782-4235 PT Individual Time Calculation (min): 56 min   Short Term Goals: Week 3:  PT Short Term Goal 1 (Week 3): Pt will transfer to to [facility] with max assist of 1 PT Short Term Goal 2 (Week 3): Pt will ambulate 5 ft with max A +2 to force use of RLE PT Short Term Goal 3 (Week 3): Pt will propel WC 100 ft with min assist using Hemi technique  Skilled Therapeutic Interventions/Progress Updates:    Patient alert and agreeable to PT session. Patient denied pain throughout session.  Patient supine in bed upon PT arrival with noted UI and soaked brief, clothing and bed  linen. Changing clothing and bed linen as well as pericare performed with focus on pt's ability to participate throughout.   Therapeutic Activity: Bed Mobility: Patient performed roll to L requiring Mod/ Max A to bring shoulder over body and is able to flex Bil knees and lower to bed surface. Then requires Mod A to roll pelvis to L side. VC for technique throughout. Roll to pt's R side with no physical assist to initiate and reaching R bed rail independently. Able to bring Bil knees to R and roll pelvis over with vc and no physical assist. Roll to R side overall supervision. Sidelying on R--> sit EOB perofrmed with vc for LLE assist to bring RLE off of bed and Min A. Mod A to push UB from bed surface with vc/ tc from therapist. Pt is able to maintain midline orientation in sitting EOB with feet supported on floor.   Transfers: Patient performed squat pivot bed--> w/c Max A +2. VC provided for anterior weight shift/ controlled forward lean, and timing of effort. STS performed in hallway using L sided HR with Max A and improved effort and muscle activation in glutes/ quads from pt. Provided verbal cues for forward lean, hand placement, timing of effort. Return to sit Max A for  controlled descent.   Gait Training:  Gait training facilitated at [facility] in hallway requiring Max A +2 for w/c follow. Max A provided for weight shifting, advancing RLE, R foot placement and knee bracing in order to reach 8 feet using. Provided verbal cues for sequencing, weight shifting, bil knee extension, upright posture throughout. Second bout also Max A +2 for w/c follow and reaching 5 feet.   Neuromuscular Re-ed: NMR facilitated with focus on static standing balance and coordinated muscle activation as well as sitting balance and midline orientation. Static standing performed with visual feedback of mirror and pt guided in upright posture, midline orientation, glute/ quad activation, and level gaze. Pt is able to demonstrate much improvement in voluntary correction and upright posturing requiring vc for small corrections.   Seated reaching from w/c with upright back and armrests removed and mirror use for visual feedback on midline orientation. Object held outside of BOS and guided pt in reaching object and returning to upright midline positioning. First two attempts with pt drifting to R and requiring Min A to block and CGA with vc for activating trunk musculature to return to midline. Pt reaching out with L UE to assist on first attempt and no LUE assist on second attempt. For remaining repetitions, pt progresses and improves with decreased drift to R and with returning to midline with no UE assist.  Patient seated in TIS w/c at end of session with  brakes locked, chair alarm set, and all needs within reach with daughter present and ST entering room for session.     Therapy Documentation Precautions:  Precautions Precautions: Fall Precaution Comments: R hemiplegia with subluxation Restrictions Weight Bearing Restrictions: No   Therapy/Group: Individual Therapy  Loel Dubonnet 07/07/2020, 11:26 AM

## 2020-07-07 NOTE — Progress Notes (Signed)
Speech Language Pathology Weekly Progress and Session Note  Patient Details  Name: [name] MRN: [id number] Date of Birth: 1940/08/15  Beginning of progress report period: June 30, 2020 End of progress report period: July 07, 2020  Today's Date: 07/07/2020 SLP Individual Time: 1100-1155 SLP Individual Time Calculation (min): 55 min  Short Term Goals: Week 3: SLP Short Term Goal 1 (Week 3): Pt will participate in MBSS to reassess oropharyngeal swallow function and potential for initiation of PO diet and advise future ST dysphaiga interventions. SLP Short Term Goal 1 - Progress (Week 3): Met SLP Short Term Goal 2 (Week 3): Pt will utilize compensatory strategies to increase speech intelligibility to ~80% at the word and phrase level with Min A cues. SLP Short Term Goal 2 - Progress (Week 3): Met SLP Short Term Goal 3 (Week 3): Pt will demonstrate recall and carrupver of novel and daily information or therapy techniques with Supervision A verbal cues. SLP Short Term Goal 3 - Progress (Week 3): Progressing toward goal SLP Short Term Goal 4 (Week 3): Pt will sustain attention to functional tasks for 20 minute intervals with Supervision A cues for redirection. SLP Short Term Goal 4 - Progress (Week 3): Met    New Short Term Goals: Week 4: SLP Short Term Goal 1 (Week 4): STG=LTG due to remaining length of stay  Weekly Progress Updates: Pt has made functional gains and met 3 out of 4 short term goals. Pt is currently Min assist for functional familiar tasks and communication due to dysarthria, dysphagia, and cognitive impairments impacting his short term memory and selective attention. Pt participated in an MBSS this week and was able to start a dysphagia 1 (puree) texture diet with honey thick liquids, medications crushed in applesauce or pudding. He does still have PEG for supplemental nutrition. Pt has demonstrated improved oropharyngeal swallow function, selective attention, and  independence with ability to use slow rate and overarticulation to achieve ~85-90% intelligibility. Pt and family education is ongoing. Pt would continue to benefit from skilled ST while inpatient in order to maximize functional independence and reduce burden of care prior to discharge. Anticipate that pt will need 24/7 supervision at discharge in addition to [place] follow up at next level of care.  Intensity: Minumum of 1-2 x/day, 30 to 90 minutes Frequency: 3 to 5 out of 7 days Duration/Length of Stay: 07/12/20 - potentially earlier, pending SNF Treatment/Interventions: Dysphagia/aspiration precaution training;Patient/family education;Therapeutic Activities;Cueing hierarchy;Functional tasks;Speech/Language facilitation;Cognitive remediation/compensation   Daily Session  Skilled Therapeutic Interventions: Pt was seen for skilled ST targeting dysphagia and cognitive goals. Pt's daughter present for session. He consumed a snack of pudding and honey thick apple juice with efficient transit and oral clearance and Supervision A cues for safe swallow strategies. No overt s/sx aspiration noted. Recommend continue current diet. Pt also completed a basic medication management task with initially Mod A but faded to Min A verbal and visual cueing for problem solving when organizing a QID pill chart. Using a pill box is a novel task for pt. He was left sitting in tilt in space chair with alarm on and needs within reach, daughter still present. Continue per current plan of care.          Pain Pain Assessment Pain Scale: Faces Pain Score: 0-No pain Faces Pain Scale: No hurt  Therapy/Group: Individual Therapy  Arbutus Leas 07/07/2020, 7:53 AM

## 2020-07-07 NOTE — Progress Notes (Signed)
[place] PHYSICAL MEDICINE & REHABILITATION PROGRESS NOTE   Subjective/Complaints: No pains or breathing issues , discussed timeframe of CVA recovery  ROS: Patient denies CP, SOB, N/V/D   Objective:   DG Swallowing Func-Speech Pathology  Result Date: 07/06/2020 Objective Swallowing Evaluation: Type of Study: MBS-Modified Barium Swallow Study  Patient Details Name: [name] MRN: [id number] Date of Birth: 1941/02/20 Today's Date: 07/06/2020 Past Medical History: Past Medical History: Diagnosis Date . Abdominal aortic aneurysm [facility])   status post endoluminal stent graft at [facility] in 2006 . Abdominal aortic aneurysm (HCC) 2006 . Arthritis  . Coronary artery disease  . GERD (gastroesophageal reflux disease)  . History of shingles   TIMES 2 . Hyperlipidemia  . Hypertension  . MRSA (methicillin resistant Staphylococcus aureus) infection  . Numbness   right leg from knee down worse when standing  . PONV (postoperative nausea and vomiting)  . Sleep apnea   pt scored 5 per Stop Bang tool at PAT visit 10/26/2015; results sent to [facility] NP . Vertigo  Past Surgical History: Past Surgical History: Procedure Laterality Date . ABDOMINAL AORTIC ANEURYSM REPAIR W/ ENDOLUMINAL GRAFT    13 years ago  . ANGIOPLASTY   . branch stenting  06/08/2004  Dr. Lavonne Chick . CARDIAC CATHETERIZATION  06/08/2004 . CARDIOVASCULAR STRESS TEST  02/14/2009 . I&D of left knee    . IR GASTROSTOMY TUBE MOD SED  06/29/2020 . LUMBAR LAMINECTOMY/DECOMPRESSION MICRODISCECTOMY N/A 11/02/2015  Procedure: MICROLUMBAR DECOMPRESSION L3-L4, L4-L5, AND L5-S1;  Surgeon: Jene Every, MD;  Location: [facility];  Service: Orthopedics;  Laterality: N/A; . PICC LINE PLACE PERIPHERAL ([facility] HX)   . TRANSTHORACIC ECHOCARDIOGRAM  06/07/2004 HPI: 80 y.o. male with atrial fibrillation on Eliquis, presenting with an acute anterior midline pontine hemorrhage  Neuro report includes  severe dysarthria, right facial droop and right hemiplegia. Pt admitted to [facility]  06/16/20.  Subjective: Pt awake, alert, pleasant, participative Assessment / Plan / Recommendation CHL IP CLINICAL IMPRESSIONS 07/06/2020 Clinical Impression Pt presents with moderate oral and pharyngeal dysphagia that appears primarily due to facial, oral, and pharyngeal weakness, decreased timing/, and ncomplete laryngeal vestibule closure. Although first trial of honey thick barium resulted in silent aspiration (PAS score 8) due to delayed timing in deflection of the epiglottis and incomplete closure of the laryngeal vestibule, no further penetration or aspiration events occurred throughout several teaspoon and self-fed cup sips of honey. Pt with less oral control of thinner boluses, as well decreased airway protection, resulting in aspiration X1 and deep penetration X1 of nectar (PAS scores 5 and 8). Puree textures (dysphagia 1) were consumed with overall weak oral manipulation and slightly reduced posterior propulsion, but functional oral transit and no airway intrusion. Pt's mastication of dysphagia 3 (mechanical soft) textures was significantly prolonged, and he also exhibited lingual residue and increased difficulty with bolus cohesion and posterior propulsion. Due to oral phase deficits, pt also demonstrated difficulty consuming barium pill in applesauce as 1 cohesive bolus, requiring extra time and additional presentations of applesauce to clear the pill from his oral cavity. A brief scan of the esophagus was unremarkable. Recommend pt begin a dysphagia 1 (puree) texture diet with honey thick liquids, medications may be crushed in applesauce or pudding. Pt should have full supervision during intake. ST will continue to provide skilled interventions to ensure diet safety and efficiency while inpatient. SLP Visit Diagnosis Dysphagia, oropharyngeal phase (R13.12) Attention and concentration deficit following -- Frontal lobe and executive function deficit following -- Impact on safety  and function Moderate  aspiration risk   CHL IP TREATMENT RECOMMENDATION 06/11/2020 Treatment Recommendations Therapy as outlined in treatment plan below   Prognosis 06/11/2020 Prognosis for Safe Diet Advancement Good Barriers to Reach Goals -- Barriers/Prognosis Comment -- CHL IP DIET RECOMMENDATION 07/06/2020 SLP Diet Recommendations Honey thick liquids;Dysphagia 1 (Puree) solids Liquid Administration via Cup Medication Administration Crushed with puree Compensations Slow rate;Small sips/bites Postural Changes Seated upright at 90 degrees   CHL IP OTHER RECOMMENDATIONS 07/06/2020 Recommended Consults -- Oral Care Recommendations Oral care BID Other Recommendations Order thickener from pharmacy;Prohibited food (jello, ice cream, thin soups);Remove water pitcher   CHL IP FOLLOW UP RECOMMENDATIONS 06/15/2020 Follow up Recommendations Inpatient Rehab   CHL IP FREQUENCY AND DURATION 06/11/2020 Speech Therapy Frequency (ACUTE ONLY) min 2x/week Treatment Duration 2 weeks      CHL IP ORAL PHASE 07/06/2020 Oral Phase Impaired Oral - Pudding Teaspoon -- Oral - Pudding Cup -- Oral - Honey Teaspoon Lingual pumping;Reduced posterior propulsion Oral - Honey Cup Lingual pumping;Reduced posterior propulsion Oral - Nectar Teaspoon -- Oral - Nectar Cup Premature spillage;Decreased bolus cohesion Oral - Nectar Straw NT Oral - Thin Teaspoon -- Oral - Thin Cup NT Oral - Thin Straw -- Oral - Puree Reduced posterior propulsion Oral - Mech Soft Lingual/palatal residue;Impaired mastication;Weak lingual manipulation;Reduced posterior propulsion;Decreased bolus cohesion;Delayed oral transit Oral - Regular NT Oral - Multi-Consistency -- Oral - Pill Reduced posterior propulsion;Decreased bolus cohesion;Delayed oral transit Oral Phase - Comment --  CHL IP PHARYNGEAL PHASE 07/06/2020 Pharyngeal Phase Impaired Pharyngeal- Pudding Teaspoon -- Pharyngeal -- Pharyngeal- Pudding Cup -- Pharyngeal -- Pharyngeal- Honey Teaspoon Delayed swallow initiation-vallecula;Reduced  epiglottic inversion;Reduced airway/laryngeal closure;Penetration/Aspiration during swallow Pharyngeal Material enters airway, passes BELOW cords without attempt by patient to eject out (silent aspiration) Pharyngeal- Honey Cup Delayed swallow initiation-vallecula Pharyngeal Material does not enter airway Pharyngeal- Nectar Teaspoon -- Pharyngeal -- Pharyngeal- Nectar Cup Delayed swallow initiation-vallecula;Delayed swallow initiation-pyriform sinuses;Reduced epiglottic inversion;Reduced airway/laryngeal closure;Penetration/Aspiration during swallow Pharyngeal Material enters airway, passes BELOW cords without attempt by patient to eject out (silent aspiration);Material enters airway, CONTACTS cords and not ejected out Pharyngeal- Nectar Straw NT Pharyngeal -- Pharyngeal- Thin Teaspoon -- Pharyngeal -- Pharyngeal- Thin Cup NT Pharyngeal -- Pharyngeal- Thin Straw -- Pharyngeal -- Pharyngeal- Puree WFL Pharyngeal Material does not enter airway Pharyngeal- Mechanical Soft WFL Pharyngeal Material does not enter airway Pharyngeal- Regular NT Pharyngeal -- Pharyngeal- Multi-consistency -- Pharyngeal -- Pharyngeal- Pill WFL Pharyngeal Material does not enter airway Pharyngeal Comment --  CHL IP CERVICAL ESOPHAGEAL PHASE 07/06/2020 Cervical Esophageal Phase WFL Pudding Teaspoon -- Pudding Cup -- Honey Teaspoon -- Honey Cup -- Nectar Teaspoon -- Nectar Cup -- Nectar Straw -- Thin Teaspoon -- Thin Cup -- Thin Straw -- Puree -- Mechanical Soft -- Regular -- Multi-consistency -- Pill -- Cervical Esophageal Comment -- [name] 07/06/2020, 9:59 AM              No results for input(s): WBC, HGB, HCT, PLT in the last 72 hours. No results for input(s): NA, K, CL, CO2, GLUCOSE, BUN, CREATININE, CALCIUM in the last 72 hours.  Intake/Output Summary (Last 24 hours) at 07/07/2020 0759 Last data filed at 07/06/2020 1821 Gross per 24 hour  Intake 60 ml  Output -  Net 60 ml        Physical Exam: Vital Signs Blood pressure  121/71, pulse 77, temperature 97.8 F (36.6 C), temperature source Oral, resp. rate 18, height 6\' 2"  (1.88 m), weight 96.5 kg, SpO2 96 %.  General: No acute distress Mood and affect are appropriate Heart: Regular rate and rhythm no rubs murmurs or extra sounds Lungs: Clear to auscultation, breathing unlabored, no rales or wheezes Abdomen: Positive bowel sounds, soft nontender to palpation, nondistended Extremities: No clubbing, cyanosis, or edema   Skin: No evidence of breakdown, no evidence of rash- Peg site has ~1cm area of erythema lateral to ostomy site   Musc: Right hand/wrist edema with mild TTP.  No tenderness in extremities. Neuro: lethargic but able to communicate with dysarthria Clonus RIght wrist flexors increased tone RIght wrist pronators 2- Right ankle DF but not able to reproduce  Severe dysarthria ongoing Motor: RUE/RLE: 0/5 proximal distal, Except trace R hip add and finger flexion but not able to perform consistently --wearing WHO  Assessment/Plan: 1. Functional deficits which require 3+ hours per day of interdisciplinary therapy in a comprehensive inpatient rehab setting.  Physiatrist is providing close team supervision and 24 hour management of active medical problems listed below.  Physiatrist and rehab team continue to assess barriers to discharge/monitor patient progress toward functional and medical goals  Care Tool:  Bathing    Body parts bathed by patient: Chest,Abdomen,Front perineal area,Right upper leg,Left upper leg,Face,Right arm   Body parts bathed by helper: Left arm,Right lower leg,Left lower leg,Buttocks     Bathing assist Assist Level: Moderate Assistance - Patient 50 - 74% (sitting on BSC)     Upper Body Dressing/Undressing Upper body dressing   What is the patient wearing?: Pull over shirt    Upper body assist Assist Level: Moderate Assistance - Patient 50 - 74%    Lower Body Dressing/Undressing Lower body dressing      What is  the patient wearing?: Incontinence brief,Pants     Lower body assist Assist for lower body dressing: 2 Helpers     Toileting Toileting    Toileting assist Assist for toileting: 2 Helpers (sara lift)     Transfers Chair/bed transfer  Transfers assist  Chair/bed transfer activity did not occur: Safety/medical concerns  Chair/bed transfer assist level: 2 Helpers Chair/bed transfer assistive device: Sliding board   Locomotion Ambulation   Ambulation assist   Ambulation activity did not occur: Safety/medical concerns  Assist level: Total Assistance - Patient < 25% Assistive device: Other (comment) (L hallway HR)     Walk 10 feet activity   Assist  Walk 10 feet activity did not occur: Safety/medical concerns        Walk 50 feet activity   Assist Walk 50 feet with 2 turns activity did not occur: Safety/medical concerns         Walk 150 feet activity   Assist Walk 150 feet activity did not occur: Safety/medical concerns         Walk 10 feet on uneven surface  activity   Assist Walk 10 feet on uneven surfaces activity did not occur: Safety/medical concerns         Wheelchair     Assist Will patient use wheelchair at discharge?: Yes Type of Wheelchair: Manual Wheelchair activity did not occur: Safety/medical concerns  Wheelchair assist level: Minimal Assistance - Patient > 75% Max wheelchair distance: 100 ft    Wheelchair 50 feet with 2 turns activity    Assist    Wheelchair 50 feet with 2 turns activity did not occur: Safety/medical concerns   Assist Level: Moderate Assistance - Patient 50 - 74%   Wheelchair 150 feet activity     Assist  Wheelchair 150 feet activity did not  occur: Safety/medical concerns       Blood pressure 121/71, pulse 77, temperature 97.8 F (36.6 C), temperature source Oral, resp. rate 18, height 6\' 2"  (1.88 m), weight 96.5 kg, SpO2 96 %.    Medical Problem List and Plan: 1.  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  Antithrombotics: -DVT/anticoagulation: SCDs             -antiplatelet therapy: research study pt either on ASA or apixaban 3. Pain Management: Continue Tylenol as needed.   Right wrist pain resolved  4. Mood: Provide emotional support             -antipsychotic agents: N/A 5. Neuropsych: This patient is capable of making decisions on his own behalf. 6. Skin/Wound Care: Routine skin checks 7. Fluids/Electrolytes/Nutrition: Routine I/O's   BMP within acceptable range on 1/3- repeat due to start of metformin 8.    Post stroke dysphagia.   NPO,PEG feeds   9.  Atrial fibrillation.  Eliquis discontinued due to ICH.  Cardiac rate controlled.  Continue beta-blocker  Rate controlled on 1/11 10.  Hypertension.  Norvasc 10 mg daily. Increased Lopressor to 25mg  TID.   Lisinopril 2.5mg  on 12/27.   Monitor with increased mobility Vitals:   07/06/20 1928 07/07/20 0417  BP: 117/85 121/71  Pulse: 74 77  Resp: 18 18  Temp: 97.7 F (36.5 C) 97.8 F (36.6 C)  SpO2: 96% 96%   Controlled on 1/12 11.  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  Contact precautions 12.  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  Hemoglobin A1c 6.1.  SSI.  CBG (last 3)  Recent Labs    07/06/20 1625 07/06/20 2104 07/07/20 0613  GLUCAP 112* 119* 107*   Excellent control 13.  GERD.  Continue Protonix 14. Hypomagnesemia: Started magnesium gluconate 250mg  daily  Magnesium 1.6 on 12/25  And 1/3  LOS: 21 days A FACE TO FACE EVALUATION WAS PERFORMED  Erick Colacendrew E Kirsteins 07/07/2020, 7:59 AM

## 2020-07-07 NOTE — Progress Notes (Signed)
Patient ID: [name], male   DOB: 28-Feb-1941, 80 y.o.   MRN: [id number]   SW has received follow up from [place] at [facility]. Facility has extended a bed offer to patient. Would like to admit tomorrow. Sw will follow up with team.  Lavera Guise, BSW [phone]

## 2020-07-07 NOTE — Discharge Summary (Signed)
Physician Discharge Summary  Patient ID: [name] MRN: [id number] DOB/AGE: [id number] y.o.  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 EXAM: CT ANGIOGRAPHY HEAD AND NECK CT PERFUSION BRAIN TECHNIQUE: Multidetector CT imaging of the head and neck was performed using the standard protocol during bolus administration of intravenous contrast. Multiplanar CT image reconstructions and MIPs were obtained to evaluate the vascular anatomy. Carotid stenosis measurements (when applicable) are obtained utilizing NASCET criteria, using the distal internal carotid diameter as the denominator. Multiphase CT imaging of the brain was performed following IV bolus contrast injection. Subsequent parametric perfusion maps were calculated using RAPID software. CONTRAST:  95mL OMNIPAQUE IOHEXOL 350 MG/ML SOLN COMPARISON:  Plain head CT 0614 hours today. FINDINGS: CT Brain Perfusion Findings: CBF (<30%) Volume: None Perfusion (Tmax>6.0s) volume: , predominantly in the cerebellar hemispheres and right PCA territory, see below. Mismatch Volume: Infarction Location:No core infarct detected. 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 Carious dentition. Ankylosis continues into the upper thoracic  spine. No acute osseous abnormality identified. Upper chest: Negative. Other neck: No acute finding. Aortic arch: Tortuous aortic arch with calcified atherosclerosis. Three vessel arch configuration. Right carotid system: Brachiocephalic tortuosity and calcified plaque without stenosis. Tortuous proximal right CCA without stenosis. Calcified distal right CCA and right ICA origin and bulb plaque. Stenosis is up to 50 % with respect to the distal vessel at the distal right ICA bulb. Left carotid system: Calcified plaque at the left CCA origin without stenosis. Tortuous proximal left CCA. Mild calcified plaque at the distal left CCA and left ICA origin. Moderate calcified plaque at the distal ICA bulb with stenosis up to 65-70 % with respect to the distal vessel (series 5, image 109). The left ICA remains patent to the skull base. Vertebral arteries: Tortuous proximal right subclavian artery with abundant calcified plaque. The right vertebral artery origin remains patent without stenosis. Non dominant right vertebral artery is patent through the V2 segment with only mild plaque. Decreasing enhancement of the right vertebral artery V3 segment which is calcified to the skull base, although without high-grade stenosis. See intracranial findings below. Tortuous and calcified proximal left subclavian artery without significant stenosis. However, calcified plaque at the left vertebral artery origin results in high-grade stenosis (series 8, image 181). The left vertebral artery is dominant. Tortuous left V1 segment. Additional calcified left V2 segment plaque although without high-grade stenosis. Decreasing distal left vertebral artery enhancement compared to the carotids. V3 calcified plaque, although no additional high-grade stenosis to the skull base. CTA HEAD Posterior circulation: Non dominant distal right vertebral artery with calcified but patent early origin of the right PICA. Distal to PICA V4 calcified plaque  results in severe stenoses (series 6, image 319, 315) but the right vertebral remains patent to the vertebrobasilar junction. Dominant left V4 segment is heavily calcified and also demonstrates moderate to severe stenosis on series 6, image 310. But the vessel  remains patent to the vertebrobasilar junction. Dolichoectatic and calcified basilar artery with considerably decreased enhancement compared to the ICA siphons, although the vessel does appear to remain patent (mid basilar lumen 111 Hounsfield units versus noncontrast CT 55 Hounsfield units earlier today). The basilar tip appears patent as well. PCA origins appear patent. SCA and AICA vessels not well visualized. There is a fetal type left PCA. Left PCA branches appear patent with mild to moderate irregularity. Asymmetrically diminished right PCA enhancement although no definite right PCA P1 or P2 occlusion. No CTA spot sign is evident in the ventral pontine hemorrhage. Anterior circulation: Dolichoectatic and calcified ICA siphons are patent with considerably more enhancement than the basilar artery. No ICA siphon stenosis despite the atherosclerosis. Patent carotid termini, MCA and ACA origins. Tortuous A1 segments. Normal anterior communicating artery. Bilateral ACA branches are within normal limits. Left MCA M1 segment and bifurcation are patent without stenosis. Right MCA M1 segment and bifurcation are patent without stenosis. There is mild calcified bilateral M1 atherosclerosis. No MCA branch occlusion is identified. Venous sinuses: Early contrast timing, not evaluated. Anatomic variants: Dominant left vertebral artery. Fetal type left PCA origin. Review of the MIP images confirms the above findings IMPRESSION: 1. Poorly enhancing dolichoectatic Basilar Artery, although the vessel does appear to remain patent. Suspect this is severely delayed basilar artery perfusion on the basis of hemodynamically significant bilateral vertebral artery stenoses  (bilateral distal V4 and also dominant Left vertebral origin). This was communicated to Dr. Otelia LimesLindzen at [phone] hours by text page via the [facility] messaging system. 2. Subsequently, there is confluent abnormal posterior circulation T-max >6s on CTP, with sparing the Left PCA territory on the basis of a fetal PCA origin on that side. No infarct core detected on CTP. 3. No CTA spot sign detected in the acute ventral pontine hemorrhage. 4. Bilateral calcified carotid artery atherosclerosis, 65-70% stenosis at the Left ICA bulb. No hemodynamically significant stenosis on the right. Dolichoectatic ICA siphons. 5.  Aortic Atherosclerosis (ICD10-I70.0). Electronically Signed   By: Odessa FlemingH  Hall M.D.   On: 06/10/2020 07:27   CT HEAD WO CONTRAST  Result Date: 06/17/2020 CLINICAL DATA:  Stroke follow-up. Worsened swallowing and dysarthria. EXAM: CT HEAD WITHOUT CONTRAST TECHNIQUE: Contiguous axial images were obtained from the base of the skull through the vertex without intravenous contrast. COMPARISON:  06/11/2020 FINDINGS: Brain: A 1.5 cm hemorrhage in the ventral pons has not significantly changed in size but demonstrates mildly decreased density consistent with evolving blood products. There is only mild surrounding edema. No new intracranial hemorrhage, acute infarct, mass, midline shift, or extra-axial fluid collection is identified. 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 Patchy hypodensities in the cerebral white matter bilaterally are unchanged and nonspecific but compatible with moderate chronic small vessel ischemic disease. There is moderate cerebral atrophy. Vascular: Calcified atherosclerosis at the skull base. Generalized intracranial arterial dolichoectasia. Skull: No fracture or suspicious osseous lesion. Sinuses/Orbits: Paranasal sinuses and mastoid air cells are clear. Unremarkable orbits. Other: None. IMPRESSION: 1. Unchanged size of pontine hemorrhage. 2. No evidence of new  intracranial abnormality. 3. Moderate chronic small vessel ischemic disease. Electronically Signed   By: Sebastian AcheAllen  Grady M.D.   On: 06/17/2020 19:41   CT HEAD WO CONTRAST  Result Date: 06/11/2020 CLINICAL DATA:  Stroke follow-up EXAM: CT HEAD WITHOUT CONTRAST TECHNIQUE: Contiguous axial images were obtained from the base of the skull through the vertex without intravenous contrast. COMPARISON:  06/10/2020 FINDINGS: Brain: Small focus of hemorrhage in  the brainstem is unchanged. There is periventricular hypoattenuation compatible with chronic microvascular disease. Generalized volume loss. Vascular: Vertebral and carotid atherosclerotic calcification. Skull: Normal Sinuses/Orbits: There is no paranasal sinus fluid level or advanced mucosal thickening. There is no mastoid or middle ear effusion. The orbits are normal. Other: None IMPRESSION: Unchanged small focus of hemorrhage in the brainstem. Electronically Signed   By: Deatra Robinson M.D.   On: 06/11/2020 05:16   CT Code Stroke CTA Neck W/WO contrast  Result Date: 06/10/2020 CLINICAL DATA:  80 year old male code stroke presentation with right side weakness and slurred speech. 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 EXAM: CT ANGIOGRAPHY HEAD AND NECK CT PERFUSION BRAIN TECHNIQUE: Multidetector CT imaging of the head and neck was performed using the standard protocol during bolus administration of intravenous contrast. Multiplanar CT image reconstructions and MIPs were obtained to evaluate the vascular anatomy. Carotid stenosis measurements (when applicable) are obtained utilizing NASCET criteria, using the distal internal carotid diameter as the denominator. Multiphase CT imaging of the brain was performed following IV bolus contrast injection. Subsequent parametric perfusion maps were calculated using RAPID software. CONTRAST:  95mL OMNIPAQUE IOHEXOL 350 MG/ML SOLN COMPARISON:  Plain head CT 0614 hours today. FINDINGS: CT Brain Perfusion Findings: CBF  (<30%) Volume: None Perfusion (Tmax>6.0s) volume: , predominantly in the cerebellar hemispheres and right PCA territory, see below. Mismatch Volume: Infarction Location:No core infarct detected. 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 Carious dentition. Ankylosis continues into the upper thoracic spine. No acute osseous abnormality identified. Upper chest: Negative. Other neck: No acute finding. Aortic arch: Tortuous aortic arch with calcified atherosclerosis. Three vessel arch configuration. Right carotid system: Brachiocephalic tortuosity and calcified plaque without stenosis. Tortuous proximal right CCA without stenosis. Calcified distal right CCA and right ICA origin and bulb plaque. Stenosis is up to 50 % with respect to the distal vessel at the distal right ICA bulb. Left carotid system: Calcified plaque at the left CCA origin without stenosis. Tortuous proximal left CCA. Mild calcified plaque at the distal left CCA and left ICA origin. Moderate calcified plaque at the distal ICA bulb with stenosis up to 65-70 % with respect to the distal vessel (series 5, image 109). The left ICA remains patent to the skull base. Vertebral arteries: Tortuous proximal right subclavian artery with abundant calcified plaque. The right vertebral artery origin remains patent without stenosis. Non dominant right vertebral artery is patent through the V2 segment with only mild plaque. Decreasing enhancement of the right vertebral artery V3 segment which is calcified to the skull base, although without high-grade stenosis. See intracranial findings below. Tortuous and calcified proximal left subclavian artery without significant stenosis. However, calcified plaque at the left vertebral artery origin results in high-grade stenosis (series 8, image 181). The left vertebral artery is dominant. Tortuous left V1 segment. Additional calcified left  V2 segment plaque although without high-grade stenosis. Decreasing distal left vertebral artery enhancement compared to the carotids. V3 calcified plaque, although no additional high-grade stenosis to the skull base. CTA HEAD Posterior circulation: Non dominant distal right vertebral artery with calcified but patent early origin of the right PICA. Distal to PICA V4 calcified plaque results in severe stenoses (series 6, image 319, 315) but the right vertebral remains patent to the vertebrobasilar junction. Dominant left V4 segment is heavily calcified and also demonstrates moderate to severe stenosis on series 6, image 310. But the vessel remains patent to the vertebrobasilar junction. Dolichoectatic and  calcified basilar artery with considerably decreased enhancement compared to the ICA siphons, although the vessel does appear to remain patent (mid basilar lumen 111 Hounsfield units versus noncontrast CT 55 Hounsfield units earlier today). The basilar tip appears patent as well. PCA origins appear patent. SCA and AICA vessels not well visualized. There is a fetal type left PCA. Left PCA branches appear patent with mild to moderate irregularity. Asymmetrically diminished right PCA enhancement although no definite right PCA P1 or P2 occlusion. No CTA spot sign is evident in the ventral pontine hemorrhage. Anterior circulation: Dolichoectatic and calcified ICA siphons are patent with considerably more enhancement than the basilar artery. No ICA siphon stenosis despite the atherosclerosis. Patent carotid termini, MCA and ACA origins. Tortuous A1 segments. Normal anterior communicating artery. Bilateral ACA branches are within normal limits. Left MCA M1 segment and bifurcation are patent without stenosis. Right MCA M1 segment and bifurcation are patent without stenosis. There is mild calcified bilateral M1 atherosclerosis. No MCA branch occlusion is identified. Venous sinuses: Early contrast timing, not evaluated.  Anatomic variants: Dominant left vertebral artery. Fetal type left PCA origin. Review of the MIP images confirms the above findings IMPRESSION: 1. Poorly enhancing dolichoectatic Basilar Artery, although the vessel does appear to remain patent. 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 This was communicated to Dr. Otelia Limes at [phone] hours by text page via the [facility] messaging system. 2. Subsequently, there is confluent abnormal posterior circulation T-max >6s on CTP, with sparing the Left PCA territory on the basis of a fetal PCA origin on that side. No infarct core detected on CTP. 3. No CTA spot sign detected in the acute ventral pontine hemorrhage. 4. Bilateral calcified carotid artery atherosclerosis, 65-70% stenosis at the Left ICA bulb. No hemodynamically significant stenosis on the right. Dolichoectatic ICA siphons. 5.  Aortic Atherosclerosis (ICD10-I70.0). Electronically Signed   By: Odessa Fleming M.D.   On: 06/10/2020 07:27   MR BRAIN WO CONTRAST  Result Date: 06/10/2020 CLINICAL DATA:  Stroke.  Intracranial hemorrhage. EXAM: MRI HEAD WITHOUT CONTRAST TECHNIQUE: Multiplanar, multiecho pulse sequences of the brain and surrounding structures were obtained without intravenous contrast. COMPARISON:  CT head 06/10/2020 FINDINGS: Brain: Negative for acute ischemic infarction. Mild chronic microvascular ischemic change in the white matter bilaterally. 14 mm hemorrhage in the ventral pons as noted on CT. No interval change. No underlying mass. Numerous other areas of chronic microhemorrhage throughout the cerebellum and cerebral hemispheres bilaterally as well as in the thalamus bilaterally and right basal ganglia. Mild atrophy without hydrocephalus or mass lesion. Vascular: Dolichoectasia of the distal vertebral arteries and basilar artery. Ectatic and tortuous  internal carotid arteries bilaterally. Normal arterial flow voids. Skull and upper cervical spine: No acute skeletal abnormality. C1-2 arthropathy with pannus posterior to the dens without significant spinal stenosis. Sinuses/Orbits: Paranasal sinuses clear.  Negative orbit Other: None IMPRESSION: Negative for acute ischemic infarct 14 mm acute hemorrhage in the ventral pons without underlying mass or change from earlier today. Numerous areas of chronic microhemorrhage in the brain bilaterally. Correlate with history of poorly controlled hypertension. If no such history is available, consider cerebral amyloid. Electronically Signed   By: Marlan Palau M.D.   On: 06/10/2020 13:21   IR GASTROSTOMY TUBE MOD SED  Result Date: 06/29/2020 INDICATION: 80 year old male referred for gastrostomy EXAM: PERC PLACEMENT GASTROSTOMY MEDICATIONS: Ancef 2 gm IV; Antibiotics were administered  within 1 hour of the procedure. ANESTHESIA/SEDATION: Versed 0.5 mg IV; Fentanyl 50 mcg IV Moderate Sedation Time: 10 minutes The patient was continuously monitored during the procedure by the interventional radiology nurse under my direct supervision. CONTRAST:  76mL OMNIPAQUE IOHEXOL 300 MG/ML SOLN - administered into the gastric lumen. FLUOROSCOPY TIME:  Fluoroscopy Time: 2 minutes 24 seconds (22 mGy). COMPLICATIONS: None PROCEDURE: Informed written consent was obtained from the patient and the patient's family after a thorough discussion of the procedural risks, benefits and alternatives. All questions were addressed. Maximal Sterile Barrier Technique was utilized including caps, mask, sterile gowns, sterile gloves, sterile drape, hand hygiene and skin antiseptic. A timeout was performed prior to the initiation of the procedure. The epigastrium was prepped with Betadine in a sterile fashion, and a sterile drape was applied covering the operative field. A sterile gown and sterile gloves were used for the procedure. A 5-French orogastric  tube is placed under fluoroscopic guidance. Scout imaging of the abdomen confirms barium within the transverse colon. The stomach was distended with gas. Under fluoroscopic guidance, an 18 gauge needle was utilized to puncture the anterior wall of the body of the stomach. An Amplatz wire was advanced through the needle passing a T fastener into the lumen of the stomach. The T fastener was secured for gastropexy. A 9-French sheath was inserted. A snare was advanced through the 9-French sheath. A Teena Dunk was advanced through the orogastric tube. It was snared then pulled out the oral cavity, pulling the snare, as well. The leading edge of the gastrostomy was attached to the snare. It was then pulled down the esophagus and out the percutaneous site. Tube secured in place. Contrast was injected. Patient tolerated the procedure well and remained hemodynamically stable throughout. No complications were encountered and no significant blood loss encountered. IMPRESSION: Status post fluoroscopic placed percutaneous gastrostomy tube, with 20 [place] pull-through. Signed, Yvone Neu. Loreta Ave, DO [facility] Electronically Signed   By: Gilmer Mor D.O.   On: 06/29/2020 14:43   CT Code Stroke Cerebral Perfusion with contrast  Result Date: 06/10/2020 CLINICAL DATA:  80 year old male code stroke presentation with right side weakness and slurred speech. 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 EXAM: CT ANGIOGRAPHY HEAD AND NECK CT PERFUSION BRAIN TECHNIQUE: Multidetector CT imaging of the head and neck was performed using the standard protocol during bolus administration of intravenous contrast. Multiplanar CT image reconstructions and MIPs were obtained to evaluate the vascular anatomy. Carotid stenosis measurements (when applicable) are obtained utilizing NASCET criteria, using the distal internal carotid diameter as the denominator. Multiphase CT imaging of the brain  was performed following IV bolus contrast injection. Subsequent parametric perfusion maps were calculated using RAPID software. CONTRAST:  45mL OMNIPAQUE IOHEXOL 350 MG/ML SOLN COMPARISON:  Plain head CT 0614 hours today. FINDINGS: CT Brain Perfusion Findings: CBF (<30%) Volume: None Perfusion (Tmax>6.0s) volume: , predominantly in the cerebellar hemispheres and right PCA territory, see below. Mismatch Volume: Infarction Location:No core infarct detected. 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 Carious dentition. Ankylosis continues into the upper thoracic spine. No acute osseous abnormality identified. Upper chest: Negative. Other neck: No acute finding. Aortic arch: Tortuous aortic arch with calcified atherosclerosis. Three vessel arch configuration. Right carotid system: Brachiocephalic tortuosity and calcified plaque without stenosis. Tortuous proximal right CCA without stenosis. Calcified distal right CCA and right ICA origin and bulb plaque. Stenosis is up to 50 % with  respect to the distal vessel at the distal right ICA bulb. Left carotid system: Calcified plaque at the left CCA origin without stenosis. Tortuous proximal left CCA. Mild calcified plaque at the distal left CCA and left ICA origin. Moderate calcified plaque at the distal ICA bulb with stenosis up to 65-70 % with respect to the distal vessel (series 5, image 109). The left ICA remains patent to the skull base. Vertebral arteries: Tortuous proximal right subclavian artery with abundant calcified plaque. The right vertebral artery origin remains patent without stenosis. Non dominant right vertebral artery is patent through the V2 segment with only mild plaque. Decreasing enhancement of the right vertebral artery V3 segment which is calcified to the skull base, although without high-grade stenosis. See intracranial findings below. Tortuous and calcified  proximal left subclavian artery without significant stenosis. However, calcified plaque at the left vertebral artery origin results in high-grade stenosis (series 8, image 181). The left vertebral artery is dominant. Tortuous left V1 segment. Additional calcified left V2 segment plaque although without high-grade stenosis. Decreasing distal left vertebral artery enhancement compared to the carotids. V3 calcified plaque, although no additional high-grade stenosis to the skull base. CTA HEAD Posterior circulation: Non dominant distal right vertebral artery with calcified but patent early origin of the right PICA. Distal to PICA V4 calcified plaque results in severe stenoses (series 6, image 319, 315) but the right vertebral remains patent to the vertebrobasilar junction. Dominant left V4 segment is heavily calcified and also demonstrates moderate to severe stenosis on series 6, image 310. But the vessel remains patent to the vertebrobasilar junction. Dolichoectatic and calcified basilar artery with considerably decreased enhancement compared to the ICA siphons, although the vessel does appear to remain patent (mid basilar lumen 111 Hounsfield units versus noncontrast CT 55 Hounsfield units earlier today). The basilar tip appears patent as well. PCA origins appear patent. SCA and AICA vessels not well visualized. There is a fetal type left PCA. Left PCA branches appear patent with mild to moderate irregularity. Asymmetrically diminished right PCA enhancement although no definite right PCA P1 or P2 occlusion. No CTA spot sign is evident in the ventral pontine hemorrhage. Anterior circulation: Dolichoectatic and calcified ICA siphons are patent with considerably more enhancement than the basilar artery. No ICA siphon stenosis despite the atherosclerosis. Patent carotid termini, MCA and ACA origins. Tortuous A1 segments. Normal anterior communicating artery. Bilateral ACA branches are within normal limits. Left MCA M1  segment and bifurcation are patent without stenosis. Right MCA M1 segment and bifurcation are patent without stenosis. There is mild calcified bilateral M1 atherosclerosis. No MCA branch occlusion is identified. Venous sinuses: Early contrast timing, not evaluated. Anatomic variants: Dominant left vertebral artery. Fetal type left PCA origin. Review of the MIP images confirms the above findings IMPRESSION: 1. Poorly enhancing dolichoectatic Basilar Artery, although the vessel does appear to remain patent. 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 This was communicated to Dr. Otelia Limes at [phone] hours by text page via the [facility] messaging system. 2. Subsequently, there is confluent abnormal posterior circulation T-max >6s on CTP, with sparing the Left PCA territory on the basis of a fetal PCA origin on that side. No infarct core detected on CTP. 3. No CTA spot sign detected in the acute ventral pontine hemorrhage. 4. Bilateral calcified carotid artery atherosclerosis, 65-70% stenosis at the Left ICA bulb. No hemodynamically significant stenosis on  the right. Dolichoectatic ICA siphons. 5.  Aortic Atherosclerosis (ICD10-I70.0). Electronically Signed   By: Odessa Fleming M.D.   On: 06/10/2020 07:27   Chest Port 1 View  Result Date: 06/10/2020 CLINICAL DATA:  80 year old male code stroke presentation, pontine hemorrhage and decreased posterior circulation perfusion. EXAM: PORTABLE CHEST 1 VIEW COMPARISON:  CT, CTA and CTP head today. Chest radiographs 06/28/2017 and earlier. FINDINGS: Portable AP semi upright view at 0730 hours. Mildly lower lung volumes. Stable cardiac size and mediastinal contours. Chronic thoracic aortic endograft. Allowing for portable technique the lungs are clear. No pneumothorax or pleural effusion. No acute osseous abnormality identified.  IMPRESSION: 1.  No acute cardiopulmonary abnormality. 2. Chronic thoracic Aortic Endograft. Electronically Signed   By: Odessa Fleming M.D.   On: 06/10/2020 07:51   DG Swallowing Func-Speech Pathology  Result Date: 07/06/2020 Objective Swallowing Evaluation: Type of Study: MBS-Modified Barium Swallow Study  Patient Details Name: [name] MRN: [id number] Date of Birth: December 18, 1940 Today's Date: 07/06/2020 Past Medical History: Past Medical History: Diagnosis Date . Abdominal aortic aneurysm [facility])   status post endoluminal stent graft at [facility] in 2006 . Abdominal aortic aneurysm (HCC) 2006 . Arthritis  . Coronary artery disease  . GERD (gastroesophageal reflux disease)  . History of shingles   TIMES 2 . Hyperlipidemia  . Hypertension  . MRSA (methicillin resistant Staphylococcus aureus) infection  . Numbness   right leg from knee down worse when standing  . PONV (postoperative nausea and vomiting)  . Sleep apnea   pt scored 5 per Stop Bang tool at PAT visit 10/26/2015; results sent to [facility] NP . Vertigo  Past Surgical History: Past Surgical History: Procedure Laterality Date . ABDOMINAL AORTIC ANEURYSM REPAIR W/ ENDOLUMINAL GRAFT    13 years ago  . ANGIOPLASTY   . branch stenting  06/08/2004  Dr. Lavonne Chick . CARDIAC CATHETERIZATION  06/08/2004 . CARDIOVASCULAR STRESS TEST  02/14/2009 . I&D of left knee    . IR GASTROSTOMY TUBE MOD SED  06/29/2020 . LUMBAR LAMINECTOMY/DECOMPRESSION MICRODISCECTOMY N/A 11/02/2015  Procedure: MICROLUMBAR DECOMPRESSION L3-L4, L4-L5, AND L5-S1;  Surgeon: Jene Every, MD;  Location: [facility];  Service: Orthopedics;  Laterality: N/A; . PICC LINE PLACE PERIPHERAL ([facility] HX)   . TRANSTHORACIC ECHOCARDIOGRAM  06/07/2004 HPI: 80 y.o. male with atrial fibrillation on Eliquis, presenting with an acute anterior midline pontine hemorrhage  Neuro report includes  severe dysarthria, right facial droop and right hemiplegia. Pt admitted to [facility] 06/16/20.  Subjective: Pt awake, alert, pleasant,  participative Assessment / Plan / Recommendation CHL IP CLINICAL IMPRESSIONS 07/06/2020 Clinical Impression Pt presents with moderate oral and pharyngeal dysphagia that appears primarily due to facial, oral, and pharyngeal weakness, decreased timing/, and ncomplete laryngeal vestibule closure. Although first trial of honey thick barium resulted in silent aspiration (PAS score 8) due to delayed timing in deflection of the epiglottis and incomplete closure of the laryngeal vestibule, no further penetration or aspiration events occurred throughout several teaspoon and self-fed cup sips of honey. Pt with less oral control of thinner boluses, as well decreased airway protection, resulting in aspiration X1 and deep penetration X1 of nectar (PAS scores 5 and 8). Puree textures (dysphagia 1) were consumed with overall weak oral manipulation and slightly reduced posterior propulsion, but functional oral transit and no airway intrusion. Pt's mastication of dysphagia 3 (mechanical soft) textures was significantly prolonged, and he also exhibited lingual residue and increased difficulty with bolus cohesion and posterior propulsion. Due to oral phase deficits, pt  also demonstrated difficulty consuming barium pill in applesauce as 1 cohesive bolus, requiring extra time and additional presentations of applesauce to clear the pill from his oral cavity. A brief scan of the esophagus was unremarkable. Recommend pt begin a dysphagia 1 (puree) texture diet with honey thick liquids, medications may be crushed in applesauce or pudding. Pt should have full supervision during intake. ST will continue to provide skilled interventions to ensure diet safety and efficiency while inpatient. SLP Visit Diagnosis Dysphagia, oropharyngeal phase (R13.12) Attention and concentration deficit following -- Frontal lobe and executive function deficit following -- Impact on safety and function Moderate aspiration risk   CHL IP TREATMENT RECOMMENDATION  06/11/2020 Treatment Recommendations Therapy as outlined in treatment plan below   Prognosis 06/11/2020 Prognosis for Safe Diet Advancement Good Barriers to Reach Goals -- Barriers/Prognosis Comment -- CHL IP DIET RECOMMENDATION 07/06/2020 SLP Diet Recommendations Honey thick liquids;Dysphagia 1 (Puree) solids Liquid Administration via Cup Medication Administration Crushed with puree Compensations Slow rate;Small sips/bites Postural Changes Seated upright at 90 degrees   CHL IP OTHER RECOMMENDATIONS 07/06/2020 Recommended Consults -- Oral Care Recommendations Oral care BID Other Recommendations Order thickener from pharmacy;Prohibited food (jello, ice cream, thin soups);Remove water pitcher   CHL IP FOLLOW UP RECOMMENDATIONS 06/15/2020 Follow up Recommendations Inpatient Rehab   CHL IP FREQUENCY AND DURATION 06/11/2020 Speech Therapy Frequency (ACUTE ONLY) min 2x/week Treatment Duration 2 weeks      CHL IP ORAL PHASE 07/06/2020 Oral Phase Impaired Oral - Pudding Teaspoon -- Oral - Pudding Cup -- Oral - Honey Teaspoon Lingual pumping;Reduced posterior propulsion Oral - Honey Cup Lingual pumping;Reduced posterior propulsion Oral - Nectar Teaspoon -- Oral - Nectar Cup Premature spillage;Decreased bolus cohesion Oral - Nectar Straw NT Oral - Thin Teaspoon -- Oral - Thin Cup NT Oral - Thin Straw -- Oral - Puree Reduced posterior propulsion Oral - Mech Soft Lingual/palatal residue;Impaired mastication;Weak lingual manipulation;Reduced posterior propulsion;Decreased bolus cohesion;Delayed oral transit Oral - Regular NT Oral - Multi-Consistency -- Oral - Pill Reduced posterior propulsion;Decreased bolus cohesion;Delayed oral transit Oral Phase - Comment --  CHL IP PHARYNGEAL PHASE 07/06/2020 Pharyngeal Phase Impaired Pharyngeal- Pudding Teaspoon -- Pharyngeal -- Pharyngeal- Pudding Cup -- Pharyngeal -- Pharyngeal- Honey Teaspoon Delayed swallow initiation-vallecula;Reduced epiglottic inversion;Reduced airway/laryngeal  closure;Penetration/Aspiration during swallow Pharyngeal Material enters airway, passes BELOW cords without attempt by patient to eject out (silent aspiration) Pharyngeal- Honey Cup Delayed swallow initiation-vallecula Pharyngeal Material does not enter airway Pharyngeal- Nectar Teaspoon -- Pharyngeal -- Pharyngeal- Nectar Cup Delayed swallow initiation-vallecula;Delayed swallow initiation-pyriform sinuses;Reduced epiglottic inversion;Reduced airway/laryngeal closure;Penetration/Aspiration during swallow Pharyngeal Material enters airway, passes BELOW cords without attempt by patient to eject out (silent aspiration);Material enters airway, CONTACTS cords and not ejected out Pharyngeal- Nectar Straw NT Pharyngeal -- Pharyngeal- Thin Teaspoon -- Pharyngeal -- Pharyngeal- Thin Cup NT Pharyngeal -- Pharyngeal- Thin Straw -- Pharyngeal -- Pharyngeal- Puree WFL Pharyngeal Material does not enter airway Pharyngeal- Mechanical Soft WFL Pharyngeal Material does not enter airway Pharyngeal- Regular NT Pharyngeal -- Pharyngeal- Multi-consistency -- Pharyngeal -- Pharyngeal- Pill WFL Pharyngeal Material does not enter airway Pharyngeal Comment --  CHL IP CERVICAL ESOPHAGEAL PHASE 07/06/2020 Cervical Esophageal Phase WFL Pudding Teaspoon -- Pudding Cup -- Honey Teaspoon -- Honey Cup -- Nectar Teaspoon -- Nectar Cup -- Nectar Straw -- Thin Teaspoon -- Thin Cup -- Thin Straw -- Puree -- Mechanical Soft -- Regular -- Multi-consistency -- Pill -- Cervical Esophageal Comment -- [name] 07/06/2020, 9:59 AM  DG Swallowing Func-Speech Pathology  Result Date: 06/11/2020 Objective Swallowing Evaluation: Type of Study: Bedside Swallow Evaluation  Patient Details Name: [name] MRN: [id number] Date of Birth: 12-Mar-1941 Today's Date: 06/11/2020 Time: SLP Start Time (ACUTE ONLY): 1137 -SLP Stop Time (ACUTE ONLY): 1155 SLP Time Calculation (min) (ACUTE ONLY): 18 min Past Medical History: Past Medical History: Diagnosis  Date . Abdominal aortic aneurysm [facility])   status post endoluminal stent graft at [facility] in 2006 . Abdominal aortic aneurysm (HCC) 2006 . Arthritis  . Coronary artery disease  . GERD (gastroesophageal reflux disease)  . History of shingles   TIMES 2 . Hyperlipidemia  . Hypertension  . MRSA (methicillin resistant Staphylococcus aureus) infection  . Numbness   right leg from knee down worse when standing  . PONV (postoperative nausea and vomiting)  . Sleep apnea   pt scored 5 per Stop Bang tool at PAT visit 10/26/2015; results sent to [facility] NP . Vertigo  Past Surgical History: Past Surgical History: Procedure Laterality Date . ABDOMINAL AORTIC ANEURYSM REPAIR W/ ENDOLUMINAL GRAFT    13 years ago  . ANGIOPLASTY   . branch stenting  06/08/2004  Dr. Lavonne Chick . CARDIAC CATHETERIZATION  06/08/2004 . CARDIOVASCULAR STRESS TEST  02/14/2009 . I&D of left knee    . LUMBAR LAMINECTOMY/DECOMPRESSION MICRODISCECTOMY N/A 11/02/2015  Procedure: MICROLUMBAR DECOMPRESSION L3-L4, L4-L5, AND L5-S1;  Surgeon: Jene Every, MD;  Location: [facility];  Service: Orthopedics;  Laterality: N/A; . PICC LINE PLACE PERIPHERAL ([facility] HX)   . TRANSTHORACIC ECHOCARDIOGRAM  06/07/2004 HPI: 80 y.o. male with atrial fibrillation on Eliquis, presenting with an acute anterior midline pontine hemorrhage  Neuro report includes  severe dysarthria, right facial droop and right hemiplegia.  Subjective: Pt awake, alert, pleasant, participative Assessment / Plan / Recommendation CHL IP CLINICAL IMPRESSIONS 06/11/2020 Clinical Impression Pt presents with moderate oropharyngeal dysphagia c/b decreased lingual strength and coordination, premature spillage, delayed swallo initiation, incomplete laryngeal closure and diminished sensation.  These deficits led to aspiration of thin and nectar thick liquid before and during the swallow with variable cough response.  Aspiration of thin liquid occurred at posterior trachea prior to the swallow 2/2 penetration  of bolus through interarytenoid space which traveled on the vocal folds to the anterior portion of the trachea.  With nectar thick liquid by cup, initially only transient penetration was observed but as study progressed, pt aspirated a significant amount of bolus prior to the swallow.  By straw, there was penetration of nectar thick liquid to the level of the vocal folds with aspiration during the swallow.  Cough response was inconsistent and significantly delayed.  Cough was ineffective to clear aspiration and penetration whether cued or reflexive.  There was no penetration or aspiration of honey thick liquid or solids.  There was prolonged oral phase with puree and solid textuers which increased with complexity of solid. With pill simulation there was prompt oral transit of tablet with honey thick liquid and no penetration or aspiraiton.  Esophageal transit of tablet appears to be [facility] as it could not be located on esphageal sweep.   Recommend chopped/ground diet with honey thick liquids. SLP Visit Diagnosis Dysphagia, oropharyngeal phase (R13.12) Attention and concentration deficit following -- Frontal lobe and executive function deficit following -- Impact on safety and function Moderate aspiration risk   CHL IP TREATMENT RECOMMENDATION 06/11/2020 Treatment Recommendations Therapy as outlined in treatment plan below   Prognosis 06/11/2020 Prognosis for Safe Diet Advancement Good Barriers to Reach Goals --  Barriers/Prognosis Comment -- CHL IP DIET RECOMMENDATION 06/11/2020 SLP Diet Recommendations Dysphagia 2 (Fine chop) solids;Honey thick liquids Liquid Administration via Cup;No straw Medication Administration Whole meds with liquid Compensations Slow rate;Small sips/bites;Follow solids with liquid;Lingual sweep for clearance of pocketing Postural Changes Seated upright at 90 degrees   CHL IP OTHER RECOMMENDATIONS 06/11/2020 Recommended Consults -- Oral Care Recommendations Oral care BID Other Recommendations --    CHL IP FOLLOW UP RECOMMENDATIONS 06/11/2020 Follow up Recommendations Inpatient Rehab   CHL IP FREQUENCY AND DURATION 06/11/2020 Speech Therapy Frequency (ACUTE ONLY) min 2x/week Treatment Duration 2 weeks      CHL IP ORAL PHASE 06/11/2020 Oral Phase Impaired Oral - Pudding Teaspoon -- Oral - Pudding Cup -- Oral - Honey Teaspoon -- Oral - Honey Cup Premature spillage Oral - Nectar Teaspoon -- Oral - Nectar Cup Premature spillage Oral - Nectar Straw Premature spillage Oral - Thin Teaspoon -- Oral - Thin Cup Premature spillage Oral - Thin Straw -- Oral - Puree Decreased bolus cohesion;Premature spillage Oral - Mech Soft Lingual/palatal residue;Lingual pumping Oral - Regular Decreased bolus cohesion;Premature spillage;Piecemeal swallowing;Lingual/palatal residue;Lingual pumping;Reduced posterior propulsion Oral - Multi-Consistency -- Oral - Pill WFL Oral Phase - Comment --  CHL IP PHARYNGEAL PHASE 06/11/2020 Pharyngeal Phase Impaired Pharyngeal- Pudding Teaspoon -- Pharyngeal -- Pharyngeal- Pudding Cup -- Pharyngeal -- Pharyngeal- Honey Teaspoon -- Pharyngeal -- Pharyngeal- Honey Cup WFL Pharyngeal -- Pharyngeal- Nectar Teaspoon -- Pharyngeal -- Pharyngeal- Nectar Cup Delayed swallow initiation-pyriform sinuses;Reduced airway/laryngeal closure;Penetration/Aspiration before swallow;Penetration/Aspiration during swallow;Moderate aspiration Pharyngeal Material enters airway, passes BELOW cords without attempt by patient to eject out (silent aspiration) Pharyngeal- Nectar Straw Delayed swallow initiation-pyriform sinuses;Reduced airway/laryngeal closure;Penetration/Aspiration before swallow;Penetration/Aspiration during swallow Pharyngeal Material enters airway, passes BELOW cords and not ejected out despite cough attempt by patient Pharyngeal- Thin Teaspoon -- Pharyngeal -- Pharyngeal- Thin Cup Delayed swallow initiation-pyriform sinuses;Penetration/Aspiration before swallow Pharyngeal Material enters airway, passes  BELOW cords and not ejected out despite cough attempt by patient Pharyngeal- Thin Straw -- Pharyngeal -- Pharyngeal- Puree WFL Pharyngeal Material does not enter airway Pharyngeal- Mechanical Soft WFL Pharyngeal Material does not enter airway Pharyngeal- Regular WFL Pharyngeal Material does not enter airway Pharyngeal- Multi-consistency -- Pharyngeal -- Pharyngeal- Pill Delayed swallow initiation-vallecula Pharyngeal Material does not enter airway Pharyngeal Comment --  CHL IP CERVICAL ESOPHAGEAL PHASE 06/11/2020 Cervical Esophageal Phase WFL Pudding Teaspoon -- Pudding Cup -- Honey Teaspoon -- Honey Cup -- Nectar Teaspoon -- Nectar Cup -- Nectar Straw -- Thin Teaspoon -- Thin Cup -- Thin Straw -- Puree -- Mechanical Soft -- Regular -- Multi-consistency -- Pill -- Cervical Esophageal Comment -- Kerrie Pleasure, MA, CCC-SLP Acute Rehabilitation Services Office: [phone] 06/11/2020, 1:02 PM              ECHOCARDIOGRAM COMPLETE  Result Date: 06/10/2020    ECHOCARDIOGRAM REPORT   Patient Name:   [name] Date of Exam: 06/10/2020 Medical Rec #:  [id number]   Height:       74.0 in Accession #:    [id number]  Weight:       218.3 lb Date of Birth:  Mar 27, 1941  BSA:          2.256 m Patient Age:    78 years    BP:           135/91 mmHg Patient Gender: M           HR:           82 bpm. Exam Location:  Inpatient Procedure: 2D Echo,  Cardiac Doppler and Color Doppler Indications:    Stroke  History:        Patient has prior history of Echocardiogram examinations, most                 recent 09/14/2019. Arrythmias:Atrial Fibrillation; Risk                 Factors:Dyslipidemia and Hypertension.  Sonographer:    Ross Ludwig RDCS (AE) Referring Phys: [id number] [facility]  Sonographer Comments: No subcostal window. IMPRESSIONS  1. Left ventricular ejection fraction, by estimation, is 60 to 65%. The left ventricle has normal function. The left ventricle has no regional wall motion abnormalities. There is moderate left  ventricular hypertrophy. Left ventricular diastolic function  could not be evaluated.  2. Right ventricular systolic function is normal. The right ventricular size is normal. Tricuspid regurgitation signal is inadequate for assessing PA pressure.  3. Left atrial size was mild to moderately dilated.  4. The mitral valve is normal in structure. Trivial mitral valve regurgitation.  5. The aortic valve is tricuspid. There is mild calcification of the aortic valve. There is mild thickening of the aortic valve. Aortic valve regurgitation is mild. Mild aortic valve stenosis.  6. Aortic dilatation noted. There is mild dilatation of the ascending aorta, measuring 42 mm. Comparison(s): No significant change from prior study. Conclusion(s)/Recommendation(s): No intracardiac source of embolism detected on this transthoracic study. A transesophageal echocardiogram is recommended to exclude cardiac source of embolism if clinically indicated. FINDINGS  Left Ventricle: Left ventricular ejection fraction, by estimation, is 60 to 65%. The left ventricle has normal function. The left ventricle has no regional wall motion abnormalities. The left ventricular internal cavity size was normal in size. There is  moderate left ventricular hypertrophy. Left ventricular diastolic function could not be evaluated due to atrial fibrillation. Left ventricular diastolic function could not be evaluated. Right Ventricle: The right ventricular size is normal. Right vetricular wall thickness was not well visualized. Right ventricular systolic function is normal. Tricuspid regurgitation signal is inadequate for assessing PA pressure. Left Atrium: Left atrial size was mild to moderately dilated. Right Atrium: Right atrial size was normal in size. Pericardium: There is no evidence of pericardial effusion. Presence of pericardial fat pad. Mitral Valve: The mitral valve is normal in structure. Trivial mitral valve regurgitation. Tricuspid Valve: The  tricuspid valve is normal in structure. Tricuspid valve regurgitation is trivial. No evidence of tricuspid stenosis. Aortic Valve: The aortic valve is tricuspid. There is mild calcification of the aortic valve. There is mild thickening of the aortic valve. Aortic valve regurgitation is mild. Aortic regurgitation PHT measures 604 msec. Mild aortic stenosis is present. Aortic valve mean gradient measures 7.7 mmHg. Aortic valve peak gradient measures 13.9 mmHg. Aortic valve area, by VTI measures 1.95 cm. Pulmonic Valve: The pulmonic valve was not well visualized. Pulmonic valve regurgitation is not visualized. Aorta: Aortic dilatation noted. There is mild dilatation of the ascending aorta, measuring 42 mm. Venous: The inferior vena cava was not well visualized. IAS/Shunts: The atrial septum is grossly normal.  LEFT VENTRICLE PLAX 2D LVIDd:         4.80 cm LVIDs:         3.10 cm LV PW:         1.70 cm LV IVS:        1.90 cm LVOT diam:     2.20 cm LV SV:         58 LV SV Index:  26 LVOT Area:     3.80 cm  RIGHT VENTRICLE RV Basal diam:  2.90 cm RV S prime:     8.81 cm/s TAPSE (M-mode): 1.4 cm LEFT ATRIUM             Index       RIGHT ATRIUM           Index LA diam:        4.80 cm 2.13 cm/m  RA Area:     15.40 cm LA Vol (A2C):   70.0 ml 31.03 ml/m RA Volume:   28.90 ml  12.81 ml/m LA Vol (A4C):   66.8 ml 29.61 ml/m LA Biplane Vol: 69.9 ml 30.99 ml/m  AORTIC VALVE AV Area (Vmax):    1.74 cm AV Area (Vmean):   1.78 cm AV Area (VTI):     1.95 cm AV Vmax:           186.33 cm/s AV Vmean:          128.667 cm/s AV VTI:            0.299 m AV Peak Grad:      13.9 mmHg AV Mean Grad:      7.7 mmHg LVOT Vmax:         85.50 cm/s LVOT Vmean:        60.267 cm/s LVOT VTI:          0.153 m LVOT/AV VTI ratio: 0.51 AI PHT:            604 msec  AORTA Ao Root diam: 3.90 cm Ao Asc diam:  4.20 cm  SHUNTS Systemic VTI:  0.15 m Systemic Diam: 2.20 cm Jodelle Red MD Electronically signed by Jodelle Red MD Signature  Date/Time: 06/10/2020/8:24:31 PM    Final    CT HEAD CODE STROKE WO CONTRAST  Result Date: 06/10/2020 CLINICAL DATA:  Code stroke. 80 year old male with right side weakness and slurred speech. EXAM: CT HEAD WITHOUT CONTRAST TECHNIQUE: Contiguous axial images were obtained from the base of the skull through the vertex without intravenous contrast. COMPARISON:  Head CT 06/11/2014 [facility] FINDINGS: Brain: Generalized cerebral volume loss since 2015. Mild ex vacuo ventricular enlargement now. Oval up to 13 mm hyperdensity throughout the ventral lower pons best seen on sagittal image 31 of series 8 most resembles an acute intra-axial hemorrhage (series 4, image 11). No significant edema or regional mass effect at this time. Estimated blood volume is 1 mL. No extra-axial or intraventricular blood identified. No posterior fossa mass effect. Supratentorial Patchy and confluent bilateral cerebral white matter hypodensity and heterogeneity in the bilateral deep gray nuclei, including a chronic left thalamic lacune which was present in 2015 (series 4, image 18). No cortically based acute infarct identified. 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 Skull: No acute osseous abnormality identified. Sinuses/Orbits: Visualized paranasal sinuses and mastoids are stable and well pneumatized. Other: No acute orbit or scalp soft tissue finding. ASPECTS [facility] Stroke Program Early CT Score) Total score (0-10 with 10 being normal): 10, but note acute brainstem hemorrhage. IMPRESSION: 1. 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 No mass effect, extra-axial- or intraventricular extension of blood at this time. 2. 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 3. These results were communicated to Dr. Otelia Limes at 6:25 am on 06/10/2020 by text page via the [facility] messaging system.  Electronically Signed   By: Odessa Fleming M.D.   On: 06/10/2020  06:26    Labs:  Basic Metabolic Panel: No results for input(s): NA, K, CL, CO2, GLUCOSE, BUN, CREATININE, CALCIUM, MG, PHOS in the last 168 hours.  CBC: No results for input(s): WBC, NEUTROABS, HGB, HCT, MCV, PLT in the last 168 hours.  CBG: Recent Labs  Lab 07/06/20 0621 07/06/20 1214 07/06/20 1625 07/06/20 2104 07/07/20 0613  GLUCAP 123* 110* 112* 119* 107*   Family history.  Sister with Alzheimer's disease maternal grandmother with heart failure.  Denies any colon cancer esophageal cancer or rectal cancer  Brief HPI:   [name] is a 80 y.o. right-handed male with history of atrial fibrillation maintained on Eliquis, AAA status post stent graft 2006, CAD with stenting hypertension hyperlipidemia and OSA.  Per chart review lives with spouse independent prior to admission intermittent use of a cane working as a Games developerdiesel mechanic.  Presented 06/10/2020 with acute onset of right-sided weakness and dysarthria.  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  CT angiogram of head and neck bilateral calcified carotid artery atherosclerosis 65 to 70% stenosis of left ICA bulb.  No hemodynamically significant stenosis.  Suspect severely delayed basilar artery perfusion on the basis of hemodynamically significant bilateral vertebral artery stenosis.  MRI showed a 14 mm acute hemorrhage in the left ventral pons without underlying mass or change from prior tracing.  Admission chemistries urine drug screen negative troponin negative MRSA PCR screening positive.  Initially placed on hypertonic saline 3%.  Eliquis has been discontinued due to ICH.  Cleviprex for blood pressure control initially.  Dysphagia #2 honey thick liquids.  Due to patient's right side weakness dysphagia and dysarthria he was admitted for a comprehensive rehab program.   Hospital Course: Gavin ReaperJames Brining was admitted to rehab 06/16/2020 for inpatient  therapies to consist of PT, ST and OT at least three hours five days a week. Past admission physiatrist, therapy team and rehab RN have worked together to provide customized collaborative inpatient rehab.  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  Research study per either aspirin or apixaban.  Noted hospital course dysphagia gastrostomy tube placed 06/29/2020 per interventional radiology for nutritional support.  Atrial fibrillation again cardiac rate controlled maintained on low-dose beta-blocker.  Blood pressure monitored with lisinopril.  GERD with Protonix as advised.  Prediabetes.  Hemoglobin A1c 6.1.  Glucophage as directed   Blood pressures were monitored on TID basis and controlled     Rehab course: During patient's stay in rehab weekly team conferences were held to monitor patient's progress, set goals and discuss barriers to discharge. At admission, patient required +2 physical assist sit to stand max assist sit to supine.  Total assist lower body bathing max is lower body bathing max assist lower body dressing total assist lower body dressing  Physical exam.  Blood pressure 152/89 pulse 88 temperature 98 respiration 24 oxygen saturation 95% room air General.  No acute distress HEENT Head.  Normocephalic and atraumatic Eyes.  Pupils round and reactive to light no discharge without nystagmus Neck.  Supple nontender no JVD without thyromegaly Cardiac irregular irregular Abdomen.  Soft nontender positive bowel sounds without rebound Respiratory effort normal no respiratory distress without wheeze Neuro.  Awake alert severely dysarthric but intelligible follows commands right facial droop.  Right upper extremity 0/5 right lower extremity 2/5 left side 4+/5  He/She  has had improvement in activity  tolerance, balance, postural control as  well as ability to compensate for deficits. He/She has had improvement in functional use RUE/LUE  and RLE/LLE as well as improvement in awareness.  Performed rolling left requiring mod max assist to bring shoulder over body able to flex bilateral knees and lower to bed surface.  Then requires mod assist to roll pelvis to left side.  Able to bring bilateral knees to right roll pelvis over verbal cues with no physical assist.  Patient perform squat pivot bed to wheelchair max assist verbal cues.  Gait training facilitated requiring max assist provided for weight shifting 5 feet.  Patient is currently min assist for functional familiar task and communication due to dysarthria.  Due to limited physical assist at home skilled nursing facility was needed bed becoming available 07/09/2020       Disposition: Discharge to skilled nursing facility    Diet: Dysphagia #1 honey thick liquids.  Osmolite 350 mL 3 times daily with meals and bedtime by PEG tube.  Prosource 45 mL 3 times daily per tube Free water 100 mL every 4 hours   Special Instructions: No driving smoking or alcohol  Medications at discharge 1.  Tylenol as needed per tube 2.  Norvasc 10 mg daily per tube 3.  Glucophage 500 mg daily by tube 4.  Lopressor 50 mg 3 times daily by tube 5.  Protonix 40 mg daily by tube 6.  Study-aspire-apixaban 5 mg or placebo tablet 5 mg twice daily versus aspirin placebo 81 mg daily 7.  Lisinopril 2.5 mg daily per tube 8.  Magrinat 250 mg daily per tube 9.  Glucophage 500 mg daily by tube  30-35 minutes were spent completing discharge summary and discharge planning  Discharge Instructions    Ambulatory referral to Neurology   Complete by: As directed    An appointment is requested in approximately 4 weeks inferior pontine hemorrhage       Contact information for follow-up providers    Kirsteins, Victorino Sparrow, MD Follow up.   Specialty: Physical Medicine and Rehabilitation Why: Office to call for  appointment Contact information: [address] [place] [address] [place] [address] [phone]            Contact information for after-discharge care    Destination    [facility] Preferred SNF .   Service: Skilled Nursing Contact information: [address] [place] [address] [phone]                  Signed: Mcarthur Rossetti Guadalupe Nickless 07/07/2020, 1:20 PM

## 2020-07-07 NOTE — Progress Notes (Signed)
Speech Language Pathology Discharge Summary  Patient Details  Name: [name] MRN: [id number] Date of Birth: Jun 15, 1941  Patient has met 5 of 6 long term goals.  Patient to discharge at overall Min;Supervision level.  Reasons goals not met: increased Min A cues required for recall   Clinical Impression/Discharge Summary:   Pt is currently Supervision-Min assist for functional familiar tasks and communication due to dysarthria, dysphagia, and cognitive impairments impacting his short term memory and selective attention. Pt participated in an MBSS this week and was able to start a dysphagia 1 (puree) texture diet with honey thick liquids, medications crushed in applesauce or pudding. He does still have PEG for supplemental nutrition. Pt has demonstrated improved oropharyngeal swallow function, selective attention, and independence with ability to use slow rate and overarticulation to achieve ~85-90% intelligibility with only Supervision level verbal cues. Increased Min A is occasionally required for recall of new complex information (ex: medications). Pt and family education has been ongoing, however they would benefit from further skilled ST education prior to discharge home from SNF. Pt is set to transfer to SNF tomorrow. He would continue to benefit from follow up ST at SNF in order to continue to ensure diet safety and efficiency, potential for upgrade, and maximize his functional communication and functional independence.   Care Partner:  Caregiver Able to Provide Assistance: No     Recommendation:  24 hour supervision/assistance;Skilled Nursing facility  Rationale for SLP Follow Up: Maximize functional communication;Maximize swallowing safety;Maximize cognitive function and independence;Reduce caregiver burden   Equipment: none   Reasons for discharge: Discharged from hospital   Patient/Family Agrees with Progress Made and Goals Achieved: Yes    Arbutus Leas 07/07/2020, 3:01  PM

## 2020-07-07 NOTE — Progress Notes (Signed)
Nutrition Follow-up  DOCUMENTATION CODES:   Not applicable  INTERVENTION:   Bolus tube feeding regimen via PEG: - Allow pt to eat from meal tray. If pt eats <50% of meal, provide bolus of 355 ml (1.5 cartons/ARCs) Osmolite 1.5 cal formula TID after meals. - Always provide HS bolus of 355 ml (1.5 cartons/ARCs) Osmolite 1.5 cal formula. - Continue ProSource TF 45 ml TID - Free water flushes of 100 ml q 4 hours  100% of bolus tube feeding regimenprovides 2253kcal, 122grams of protein, and of H2O.  Total free water with flushes: 1684 ml  - Magic cup TID with meals, each supplement provides 290 kcal and 9 grams of protein  NUTRITION DIAGNOSIS:   Inadequate oral intake related to inability to eat,dysphagia as evidenced by NPO status.  Progressing, pt now on dysphagia 1 diet with honey-thick liquids  GOAL:   Patient will meet greater than or equal to 90% of their needs  Progressing  MONITOR:   Diet advancement,Labs,Weight trends,TF tolerance,I & O's  REASON FOR ASSESSMENT:   Consult Enteral/tube feeding initiation and management  ASSESSMENT:   [name] is a 80 year old right-handed male with history of atrial fibrillation maintained on Eliquis, AAA status post stent graft 2006, CAD status post stenting, hypertension hyperlipidemia and OSA.  12/24 - Cortrak placed, tip gastric 01/03 - Cortrak clogged and removed 01/04 - Cortrak replaced, tip gastric 01/05 - PEG tube placed 01/12 - MBS, diet advanced to dysphagia 1 with honey-thick liquids  Spoke with pt and family member at bedside. Pt reports that he has been trying to eat but does not have much of an appetite. Pt enjoys the [employer]. RD will make sure that Magic Cups come TID with meals. Pt does not like the pureed green beans.  Discussed plan with RN and pt for pt to receive bolus feed if does not eat well at meal (<50%). All questions answered.  CIR admit weight: 93.8 kg Current weight: 96.5  kg  Meal Completion: 25-40%  Medications reviewed and include: SSI, magnesium gluconate 250 mg daily, metformin, protonix, senokot  Labs reviewed. CBG's: 107-119 x 24 hours  Diet Order:   Diet Order            DIET - DYS 1 Room service appropriate? Yes; Fluid consistency: Honey Thick  Diet effective now                 EDUCATION NEEDS:   Education needs have been addressed  Skin:  Skin Assessment: Reviewed RN Assessment  Last BM:  07/06/20 large type 6  Height:   Ht Readings from Last 1 Encounters:  06/16/20 6\' 2"  (1.88 m)    Weight:   Wt Readings from Last 1 Encounters:  07/07/20 96.5 kg    Ideal Body Weight:  86.4 kg  BMI:  Body mass index is 27.31 kg/m.  Estimated Nutritional Needs:   Kcal:  2150-2350  Protein:  115-130 grams  Fluid:  > 2 L    06-02-1990, MS, RD, LDN Inpatient Clinical Dietitian Please see AMiON for contact information.

## 2020-07-08 LAB — GLUCOSE, CAPILLARY
Glucose-Capillary: 116 mg/dL — ABNORMAL HIGH (ref 70–99)
Glucose-Capillary: 113 mg/dL — ABNORMAL HIGH (ref 70–99)

## 2020-07-08 NOTE — Progress Notes (Addendum)
PHYSICAL MEDICINE & REHABILITATION PROGRESS NOTE   Subjective/Complaints: Eating breakfast ~50% , minimal fluid intake  ROS: Patient denies CP, SOB, N/V/D   Objective:   DG Swallowing Func-Speech Pathology  Result Date: 07/06/2020 Objective Swallowing Evaluation: Type of Study: MBS-Modified Barium Swallow Study  Patient Details Name: [name] MRN: [id number] Date of Birth: December 21, 1940 Today's Date: 07/06/2020 Past Medical History: Past Medical History: Diagnosis Date . Abdominal aortic aneurysm [facility])   status post endoluminal stent graft at [facility] in 2006 . Abdominal aortic aneurysm (HCC) 2006 . Arthritis  . Coronary artery disease  . GERD (gastroesophageal reflux disease)  . History of shingles   TIMES 2 . Hyperlipidemia  . Hypertension  . MRSA (methicillin resistant Staphylococcus aureus) infection  . Numbness   right leg from knee down worse when standing  . PONV (postoperative nausea and vomiting)  . Sleep apnea   pt scored 5 per Stop Bang tool at PAT visit 10/26/2015; results sent to [facility] NP . Vertigo  Past Surgical History: Past Surgical History: Procedure Laterality Date . ABDOMINAL AORTIC ANEURYSM REPAIR W/ ENDOLUMINAL GRAFT    13 years ago  . ANGIOPLASTY   . branch stenting  06/08/2004  Dr. Lavonne Chick . CARDIAC CATHETERIZATION  06/08/2004 . CARDIOVASCULAR STRESS TEST  02/14/2009 . I&D of left knee    . IR GASTROSTOMY TUBE MOD SED  06/29/2020 . LUMBAR LAMINECTOMY/DECOMPRESSION MICRODISCECTOMY N/A 11/02/2015  Procedure: MICROLUMBAR DECOMPRESSION L3-L4, L4-L5, AND L5-S1;  Surgeon: Jene Every, MD;  Location: [facility] ORS;  Service: Orthopedics;  Laterality: N/A; . PICC LINE PLACE PERIPHERAL ([facility] HX)   . TRANSTHORACIC ECHOCARDIOGRAM  06/07/2004 HPI: 80 y.o. male with atrial fibrillation on Eliquis, presenting with an acute anterior midline pontine hemorrhage  Neuro report includes  severe dysarthria, right facial droop and right hemiplegia. Pt admitted to [facility] 06/16/20.   Subjective: Pt awake, alert, pleasant, participative Assessment / Plan / Recommendation CHL IP CLINICAL IMPRESSIONS 07/06/2020 Clinical Impression Pt presents with moderate oral and pharyngeal dysphagia that appears primarily due to facial, oral, and pharyngeal weakness, decreased timing/, and ncomplete laryngeal vestibule closure. Although first trial of honey thick barium resulted in silent aspiration (PAS score 8) due to delayed timing in deflection of the epiglottis and incomplete closure of the laryngeal vestibule, no further penetration or aspiration events occurred throughout several teaspoon and self-fed cup sips of honey. Pt with less oral control of thinner boluses, as well decreased airway protection, resulting in aspiration X1 and deep penetration X1 of nectar (PAS scores 5 and 8). Puree textures (dysphagia 1) were consumed with overall weak oral manipulation and slightly reduced posterior propulsion, but functional oral transit and no airway intrusion. Pt's mastication of dysphagia 3 (mechanical soft) textures was significantly prolonged, and he also exhibited lingual residue and increased difficulty with bolus cohesion and posterior propulsion. Due to oral phase deficits, pt also demonstrated difficulty consuming barium pill in applesauce as 1 cohesive bolus, requiring extra time and additional presentations of applesauce to clear the pill from his oral cavity. A brief scan of the esophagus was unremarkable. Recommend pt begin a dysphagia 1 (puree) texture diet with honey thick liquids, medications may be crushed in applesauce or pudding. Pt should have full supervision during intake. ST will continue to provide skilled interventions to ensure diet safety and efficiency while inpatient. SLP Visit Diagnosis Dysphagia, oropharyngeal phase (R13.12) Attention and concentration deficit following -- Frontal lobe and executive function deficit following -- Impact on safety and function Moderate aspiration  risk    CHL IP TREATMENT RECOMMENDATION 06/11/2020 Treatment Recommendations Therapy as outlined in treatment plan below   Prognosis 06/11/2020 Prognosis for Safe Diet Advancement Good Barriers to Reach Goals -- Barriers/Prognosis Comment -- CHL IP DIET RECOMMENDATION 07/06/2020 SLP Diet Recommendations Honey thick liquids;Dysphagia 1 (Puree) solids Liquid Administration via Cup Medication Administration Crushed with puree Compensations Slow rate;Small sips/bites Postural Changes Seated upright at 90 degrees   CHL IP OTHER RECOMMENDATIONS 07/06/2020 Recommended Consults -- Oral Care Recommendations Oral care BID Other Recommendations Order thickener from pharmacy;Prohibited food (jello, ice cream, thin soups);Remove water pitcher   CHL IP FOLLOW UP RECOMMENDATIONS 06/15/2020 Follow up Recommendations Inpatient Rehab   CHL IP FREQUENCY AND DURATION 06/11/2020 Speech Therapy Frequency (ACUTE ONLY) min 2x/week Treatment Duration 2 weeks      CHL IP ORAL PHASE 07/06/2020 Oral Phase Impaired Oral - Pudding Teaspoon -- Oral - Pudding Cup -- Oral - Honey Teaspoon Lingual pumping;Reduced posterior propulsion Oral - Honey Cup Lingual pumping;Reduced posterior propulsion Oral - Nectar Teaspoon -- Oral - Nectar Cup Premature spillage;Decreased bolus cohesion Oral - Nectar Straw NT Oral - Thin Teaspoon -- Oral - Thin Cup NT Oral - Thin Straw -- Oral - Puree Reduced posterior propulsion Oral - Mech Soft Lingual/palatal residue;Impaired mastication;Weak lingual manipulation;Reduced posterior propulsion;Decreased bolus cohesion;Delayed oral transit Oral - Regular NT Oral - Multi-Consistency -- Oral - Pill Reduced posterior propulsion;Decreased bolus cohesion;Delayed oral transit Oral Phase - Comment --  CHL IP PHARYNGEAL PHASE 07/06/2020 Pharyngeal Phase Impaired Pharyngeal- Pudding Teaspoon -- Pharyngeal -- Pharyngeal- Pudding Cup -- Pharyngeal -- Pharyngeal- Honey Teaspoon Delayed swallow initiation-vallecula;Reduced epiglottic  inversion;Reduced airway/laryngeal closure;Penetration/Aspiration during swallow Pharyngeal Material enters airway, passes BELOW cords without attempt by patient to eject out (silent aspiration) Pharyngeal- Honey Cup Delayed swallow initiation-vallecula Pharyngeal Material does not enter airway Pharyngeal- Nectar Teaspoon -- Pharyngeal -- Pharyngeal- Nectar Cup Delayed swallow initiation-vallecula;Delayed swallow initiation-pyriform sinuses;Reduced epiglottic inversion;Reduced airway/laryngeal closure;Penetration/Aspiration during swallow Pharyngeal Material enters airway, passes BELOW cords without attempt by patient to eject out (silent aspiration);Material enters airway, CONTACTS cords and not ejected out Pharyngeal- Nectar Straw NT Pharyngeal -- Pharyngeal- Thin Teaspoon -- Pharyngeal -- Pharyngeal- Thin Cup NT Pharyngeal -- Pharyngeal- Thin Straw -- Pharyngeal -- Pharyngeal- Puree WFL Pharyngeal Material does not enter airway Pharyngeal- Mechanical Soft WFL Pharyngeal Material does not enter airway Pharyngeal- Regular NT Pharyngeal -- Pharyngeal- Multi-consistency -- Pharyngeal -- Pharyngeal- Pill WFL Pharyngeal Material does not enter airway Pharyngeal Comment --  CHL IP CERVICAL ESOPHAGEAL PHASE 07/06/2020 Cervical Esophageal Phase WFL Pudding Teaspoon -- Pudding Cup -- Honey Teaspoon -- Honey Cup -- Nectar Teaspoon -- Nectar Cup -- Nectar Straw -- Thin Teaspoon -- Thin Cup -- Thin Straw -- Puree -- Mechanical Soft -- Regular -- Multi-consistency -- Pill -- Cervical Esophageal Comment -- [place]arin E Smith 07/06/2020, 9:59 AM              No results for input(s): WBC, HGB, HCT, PLT in the last 72 hours. No results for input(s): NA, K, CL, CO2, GLUCOSE, BUN, CREATININE, CALCIUM in the last 72 hours.  Intake/Output Summary (Last 24 hours) at 07/08/2020 0813 Last data filed at 07/07/2020 1850 Gross per 24 hour  Intake 20 ml  Output --  Net 20 ml        Physical Exam: Vital Signs Blood pressure (!) 134/93,  pulse 94, temperature 97.9 F (36.6 C), temperature source Oral, resp. rate 18, height 6\' 2"  (1.88 m), weight 96.5 kg, SpO2 95 %.   General: No acute  distress Mood and affect are appropriate Heart: Regular rate and rhythm no rubs murmurs or extra sounds Lungs: Clear to auscultation, breathing unlabored, no rales or wheezes Abdomen: Positive bowel sounds, soft nontender to palpation, nondistended Extremities: No clubbing, cyanosis, or edema  Skin: No evidence of breakdown, no evidence of rash- Peg site has ~1cm area of erythema lateral to ostomy site   Musc: .  No tenderness in extremities. Neuro: lethargic but able to communicate with dysarthria Clonus RIght wrist flexors increased tone RIght wrist pronators  Severe dysarthria ongoing Motor: RUE/RLE: 0/5 proximal distal, Except trace R hip add and finger flexion but not able to perform consistently   Assessment/Plan: 1. Functional deficits which require 3+ hours per day of interdisciplinary therapy in a comprehensive inpatient rehab setting.  Physiatrist is providing close team supervision and 24 hour management of active medical problems listed below.  Physiatrist and rehab team continue to assess barriers to discharge/monitor patient progress toward functional and medical goals  Care Tool:  Bathing    Body parts bathed by patient: Chest,Abdomen,Front perineal area,Right upper leg,Left upper leg,Face,Right arm   Body parts bathed by helper: Left arm,Right lower leg,Left lower leg,Buttocks     Bathing assist Assist Level: Moderate Assistance - Patient 50 - 74%     Upper Body Dressing/Undressing Upper body dressing   What is the patient wearing?: Pull over shirt,Hospital gown only    Upper body assist Assist Level: Moderate Assistance - Patient 50 - 74%    Lower Body Dressing/Undressing Lower body dressing      What is the patient wearing?: Incontinence brief,Pants     Lower body assist Assist for lower body  dressing: 2 Helpers     Toileting Toileting    Toileting assist Assist for toileting: 2 Helpers     Transfers Chair/bed transfer  Transfers assist  Chair/bed transfer activity did not occur: Safety/medical concerns  Chair/bed transfer assist level: 2 Helpers Chair/bed transfer assistive device: Sliding board   Locomotion Ambulation   Ambulation assist   Ambulation activity did not occur: Safety/medical concerns  Assist level: Total Assistance - Patient < 25% Assistive device: Other (comment) (LHR in hallway) Max distance: 8 feet   Walk 10 feet activity   Assist  Walk 10 feet activity did not occur: Safety/medical concerns        Walk 50 feet activity   Assist Walk 50 feet with 2 turns activity did not occur: Safety/medical concerns         Walk 150 feet activity   Assist Walk 150 feet activity did not occur: Safety/medical concerns         Walk 10 feet on uneven surface  activity   Assist Walk 10 feet on uneven surfaces activity did not occur: Safety/medical concerns         Wheelchair     Assist Will patient use wheelchair at discharge?: Yes Type of Wheelchair: Manual Wheelchair activity did not occur: Safety/medical concerns  Wheelchair assist level: Minimal Assistance - Patient > 75% Max wheelchair distance: 100 ft    Wheelchair 50 feet with 2 turns activity    Assist    Wheelchair 50 feet with 2 turns activity did not occur: Safety/medical concerns   Assist Level: Moderate Assistance - Patient 50 - 74%   Wheelchair 150 feet activity     Assist  Wheelchair 150 feet activity did not occur: Safety/medical concerns       Blood pressure (!) 134/93, pulse 94, temperature 97.9 F (36.6 C),  temperature source Oral, resp. rate 18, height 6\' 2"  (1.88 m), weight 96.5 kg, SpO2 95 %.    Medical Problem List and Plan: 1.  Right side hemiplegia with dysphagia as well as dysarthria secondary to acute inferior pontine  hemorrhage likely due to hypertension in the setting of Eliquis use as well significant bilateral vertebral artery stenosis  Stable for d/c to SNF minimal motor gains  PEG feeds converted to bolus feeds per dietician- tolerating well   WHO/PRAFO ordered 2.  Antithrombotics: -DVT/anticoagulation: SCDs             -antiplatelet therapy: research study pt either on ASA or apixaban 3. Pain Management: Continue Tylenol as needed.   Right wrist pain resolved  4. Mood: Provide emotional support             -antipsychotic agents: N/A 5. Neuropsych: This patient is capable of making decisions on his own behalf. 6. Skin/Wound Care: Routine skin checks 7. Fluids/Electrolytes/Nutrition: Routine I/O's   BMP within acceptable range on 1/3- repeat due to start of metformin 8.    Post stroke dysphagia.   D1 diet, just started, SNF dietician can adjust TF amt based on po intake , cont PEG feeds  For nutrition and fluids 9.  Atrial fibrillation.  Eliquis discontinued due to ICH.  Cardiac rate controlled.  Continue beta-blocker  Rate controlled on 1/11 10.  Hypertension.  Norvasc 10 mg daily. Increased Lopressor to 25mg  TID.   Lisinopril 2.5mg  on 12/27.   Monitor with increased mobility Vitals:   07/07/20 1925 07/08/20 0423  BP: 139/79 (!) 134/93  Pulse: 79 94  Resp: 18 18  Temp: (!) 97.3 F (36.3 C) 97.9 F (36.6 C)  SpO2: 95% 95%   Fair control 1/14 11.  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  Contact precautions 12.  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  Hemoglobin A1c 6.1.  SSI.  CBG (last 3)  Recent Labs    07/07/20 0613 07/07/20 2113 07/08/20 0602  GLUCAP 107* 109* 116*   Excellent control 13.  GERD.  Continue Protonix 14. Hypomagnesemia: Started magnesium gluconate 250mg  daily  Magnesium 1.6 on 12/25  And 1/3  LOS: 22 days A FACE TO FACE EVALUATION WAS PERFORMED  07/10/20 07/08/2020, 8:13 AM

## 2020-07-08 NOTE — Progress Notes (Signed)
Physical Therapy Discharge Summary  Patient Details  Name: [name] MRN: [id number] Date of Birth: [birth year]  Today's Date: 07/08/2020      Patient has met 5 of 8 long term goals due to improved activity tolerance, improved balance, improved postural control, increased strength, increased range of motion and functional use of  right lower extremity.  Patient to discharge at a wheelchair level Max Assist.   Patient's care partner unavailable to provide the necessary physical assistance at discharge.  Reasons goals not met: Continued weakness from dense hemiplegia with extremely slow neuromotor return.  Recommendation:  Patient will benefit from ongoing skilled PT services in skilled nursing facility setting to continue to advance safe functional mobility, address ongoing impairments in strength, sitting and standing balance, coordination/ motor control, LOA required for all mobility, and to minimize fall risk.  Equipment: No equipment provided  Reasons for discharge: discharge from hospital  Patient/family agrees with progress made and goals achieved: Yes  PT Discharge Precautions/Restrictions Precautions Precautions: Fall Precaution Comments: R sided hemiplegia with shoulder subluxation Vital Signs Therapy Vitals Temp: 99.2 F (37.3 C) Temp Source: Oral Pulse Rate: 78 Resp: 18 BP: 121/78 Oxygen Therapy SpO2: 94 % O2 Device: Room Air  Vision/Perception  Vision - Assessment Eye Alignment: Within Functional Limits Diplopia Assessment: Disappears with one eye closed;Present in far gaze Perception Inattention/Neglect: Appears intact Praxis Praxis: Intact  Cognition Overall Cognitive Status: Within Functional Limits for tasks assessed Arousal/Alertness: Awake/alert Attention: Sustained;Focused Focused Attention: Appears intact Sustained Attention: Appears intact Selective Attention: Appears intact Awareness: Appears intact Safety/Judgment: Appears  intact Sensation Sensation Light Touch: Appears Intact Coordination Gross Motor Movements are Fluid and Coordinated: No Fine Motor Movements are Fluid and Coordinated: No Coordination and Movement Description: RLE hemipareisis with decreased coordination Motor  Motor Motor: Hemiplegia;Abnormal tone;Abnormal postural alignment and control Motor - Discharge Observations: slowly improving tone in RLE, poor coordinationw with slow and minimal increase in strength  Mobility Bed Mobility Bed Mobility: Rolling Right;Rolling Left;Right Sidelying to Sit;Sit to Sidelying Right Rolling Right: Minimal Assistance - Patient > 75% Rolling Left: Moderate Assistance - Patient 50-74% Right Sidelying to Sit: Moderate Assistance - Patient 50-74% Supine to Sit: Moderate Assistance - Patient 50-74% Sit to Supine: Moderate Assistance - Patient 50-74% Sit to Sidelying Right: Moderate Assistance - Patient 50-74% Transfers Transfers: Sit to Stand;Stand to Sit;Squat Pivot Transfers Sit to Stand: Maximal Assistance - Patient 25-49% Stand to Sit: Maximal Assistance - Patient 25-49% Squat Pivot Transfers: Total Assistance - Patient < 25%;2 Press photographer (Assistive device): Other (Comment) (handrail or w/c armrest) Transfer via Lift Equipment: Fish farm manager Ambulation: Yes Gait Assistance: Maximal Assistance - Patient 25-49% Gait Distance (Feet): 8 Feet Assistive device: Other (Comment) (L side handrail) Gait Assistance Details: Tactile cues for initiation;Tactile cues for sequencing;Tactile cues for weight shifting;Tactile cues for posture;Tactile cues for weight beaing;Visual cues/gestures for sequencing;Verbal cues for technique;Verbal cues for sequencing;Verbal cues for precautions/safety;Verbal cues for gait pattern;Manual facilitation for weight shifting Gait Gait: Yes Gait Pattern: Step-to pattern;Decreased step length - right;Decreased step length - left;Decreased weight shift to  right;Right flexed knee in stance;Left flexed knee in stance;Trunk flexed;Narrow base of support;Poor foot clearance - left;Poor foot clearance - right Gait velocity: decreased Stairs / Additional Locomotion Stairs: No Wheelchair Mobility Wheelchair Mobility: Yes Wheelchair Assistance: Minimal assistance - Patient >75% Wheelchair Propulsion: Left upper extremity;Left lower extremity Wheelchair Parts Management: Needs assistance Distance: 113f  Trunk/Postural Assessment  Cervical Assessment Cervical Assessment: Exceptions to [facility](forward head, increased flexion, kyphotic posture  at CT junction) Thoracic Assessment Thoracic Assessment: Exceptions to [facility] (kyphotic posture) Lumbar Assessment Lumbar Assessment: Exceptions to [place] [facility] (posterior pelvic tilt) Postural Control Postural Control: Deficits on evaluation Trunk Control: Continued R sided lean Righting Reactions: delayed Protective Responses: delayed/insufficient Postural Limitations: impaired  Balance Balance Balance Assessed: Yes Static Sitting Balance Static Sitting - Balance Support: Feet supported Static Sitting - Level of Assistance: 5: Stand by assistance Dynamic Sitting Balance Dynamic Sitting - Balance Support: Feet supported Dynamic Sitting - Level of Assistance: 4: Min assist Dynamic Sitting Balance - Compensations: supports/ grabs with LUE when losing balance to R side Dynamic Sitting - Balance Activities: Forward lean/weight shifting;Reaching for objects;Lateral lean/weight shifting;Reaching across midline;Trunk control activities Sitting balance - Comments: minA with RUE support, mod A without RUE support Extremity Assessment      RLE Assessment RLE Assessment: Exceptions to [facility] General Strength Comments: 1 to 2-/ 5, improved to 2- to 2/ 5 during functional activity LLE Assessment LLE Assessment: Within Functional Limits General Strength Comments: 4/5 grossly    [name] 07/08/2020, 4:12 PM

## 2020-07-08 NOTE — Progress Notes (Signed)
Patient ID: [name], male   DOB: May 14, 1941, 80 y.o.   MRN: [id number]   Facility request patient transport to be moved to 5 PM.  [place], [place] [phone]

## 2020-07-08 NOTE — Progress Notes (Signed)
Bladder scan pt at 0039 and got . Pt I/O cath q8. RN will give pt until 0130 to urinate. Pt stated," Please let me try and pee first before you put a tube in me." Pt in no acute distress. RN will continue to monitor.

## 2020-07-08 NOTE — Progress Notes (Addendum)
Patient ID: [name], male   DOB: 1941-05-31, 81 y.o.   MRN: [id number]   Patient transportation set for 2PM! D/C packet left at nursing station  [place], [place] [phone]

## 2020-07-08 NOTE — Progress Notes (Signed)
Pt discharged at 2011pm to go to a nursing home via transport. Pt did not receive study medication due to transport arriving. Medication and paperwork sent with transport. No personal items left. Pt in no acute distress.

## 2020-07-12 NOTE — Progress Notes (Signed)
Inpatient Rehabilitation Care Coordinator Discharge Note  The overall goal for the admission was met for:   Discharge location: Yes, SNF  Length of Stay: Yes  Discharge activity level: Yes  Home/community participation: Yes  Services provided included: MD, RD, PT, OT, SLP, RN, CM, TR, Pharmacy and SW  Financial Services: Medicare  Choices offered to/list presented BO:[id number]  Follow-up services arranged: Other: SNF  Comments (or additional information):  Patient/Family verbalized understanding of follow-up arrangements: Yes  Individual responsible for coordination of the follow-up plan: spouse  Confirmed correct DME delivered: Dyanne Iha 07/12/2020    Dyanne Iha

## 2020-07-22 ENCOUNTER — Encounter: Payer: Medicare Other | Admitting: Registered Nurse

## 2020-07-24 ENCOUNTER — Emergency Department (HOSPITAL_COMMUNITY): Payer: Medicare Other

## 2020-07-24 ENCOUNTER — Other Ambulatory Visit: Payer: Self-pay

## 2020-07-24 ENCOUNTER — Emergency Department (HOSPITAL_COMMUNITY)
Admission: EM | Admit: 2020-07-24 | Discharge: 2020-07-24 | Disposition: A | Discharge status: Home / Self Care | Payer: Medicare Other | Attending: Emergency Medicine | Admitting: Emergency Medicine

## 2020-07-24 ENCOUNTER — Encounter (HOSPITAL_COMMUNITY): Payer: Self-pay

## 2020-07-24 DIAGNOSIS — Z7982 Long term (current) use of aspirin: Secondary | ICD-10-CM | POA: Insufficient documentation

## 2020-07-24 DIAGNOSIS — M79652 Pain in left thigh: Secondary | ICD-10-CM | POA: Insufficient documentation

## 2020-07-24 DIAGNOSIS — Z7901 Long term (current) use of anticoagulants: Secondary | ICD-10-CM | POA: Insufficient documentation

## 2020-07-24 DIAGNOSIS — G8929 Other chronic pain: Secondary | ICD-10-CM | POA: Insufficient documentation

## 2020-07-24 DIAGNOSIS — I2510 Atherosclerotic heart disease of native coronary artery without angina pectoris: Secondary | ICD-10-CM | POA: Diagnosis not present

## 2020-07-24 DIAGNOSIS — Z79899 Other long term (current) drug therapy: Secondary | ICD-10-CM | POA: Insufficient documentation

## 2020-07-24 DIAGNOSIS — Z7984 Long term (current) use of oral hypoglycemic drugs: Secondary | ICD-10-CM | POA: Diagnosis not present

## 2020-07-24 DIAGNOSIS — E119 Type 2 diabetes mellitus without complications: Secondary | ICD-10-CM | POA: Insufficient documentation

## 2020-07-24 DIAGNOSIS — I10 Essential (primary) hypertension: Secondary | ICD-10-CM | POA: Diagnosis not present

## 2020-07-24 DIAGNOSIS — M79651 Pain in right thigh: Secondary | ICD-10-CM | POA: Diagnosis not present

## 2020-07-24 DIAGNOSIS — Z951 Presence of aortocoronary bypass graft: Secondary | ICD-10-CM | POA: Insufficient documentation

## 2020-07-24 DIAGNOSIS — R55 Syncope and collapse: Secondary | ICD-10-CM | POA: Insufficient documentation

## 2020-07-24 LAB — CBC WITH DIFFERENTIAL/PLATELET
Abs Immature Granulocytes: 0.03 10*3/uL (ref 0.00–0.07)
Basophils Absolute: 0 10*3/uL (ref 0.0–0.1)
Basophils Relative: 0 %
Eosinophils Absolute: 0.1 10*3/uL (ref 0.0–0.5)
Eosinophils Relative: 2 %
HCT: 47.7 % (ref 39.0–52.0)
Hemoglobin: 16.4 g/dL (ref 13.0–17.0)
Immature Granulocytes: 1 %
Lymphocytes Relative: 23 %
Lymphs Abs: 1.4 10*3/uL (ref 0.7–4.0)
MCH: 30.5 pg (ref 26.0–34.0)
MCHC: 34.4 g/dL (ref 30.0–36.0)
MCV: 88.8 fL (ref 80.0–100.0)
Monocytes Absolute: 0.5 10*3/uL (ref 0.1–1.0)
Monocytes Relative: 8 %
Neutro Abs: 4.1 10*3/uL (ref 1.7–7.7)
Neutrophils Relative %: 66 %
Platelets: 171 10*3/uL (ref 150–400)
RBC: 5.37 MIL/uL (ref 4.22–5.81)
RDW: 13.9 % (ref 11.5–15.5)
WBC: 6.2 10*3/uL (ref 4.0–10.5)
nRBC: 0 % (ref 0.0–0.2)

## 2020-07-24 LAB — BASIC METABOLIC PANEL
Anion gap: 8 (ref 5–15)
BUN: 21 mg/dL (ref 8–23)
CO2: 24 mmol/L (ref 22–32)
Calcium: 9.2 mg/dL (ref 8.9–10.3)
Chloride: 100 mmol/L (ref 98–111)
Creatinine, Ser: 1.1 mg/dL (ref 0.61–1.24)
GFR, Estimated: >60 mL/min (ref >60)
Glucose, Bld: 105 mg/dL — ABNORMAL HIGH (ref 70–99)
Potassium: 4.3 mmol/L (ref 3.5–5.1)
Sodium: 132 mmol/L — ABNORMAL LOW (ref 135–145)

## 2020-07-24 LAB — URINALYSIS, ROUTINE W REFLEX MICROSCOPIC
Bilirubin Urine: NEGATIVE
Glucose, UA: NEGATIVE mg/dL
Hgb urine dipstick: NEGATIVE
Ketones, ur: NEGATIVE mg/dL
Leukocytes,Ua: NEGATIVE
Nitrite: NEGATIVE
Protein, ur: NEGATIVE mg/dL
Specific Gravity, Urine: 1.006 (ref 1.005–1.030)
pH: 6 (ref 5.0–8.0)

## 2020-07-24 MED ORDER — ACETAMINOPHEN 325 MG PO TABS
650.0000 mg | ORAL_TABLET | Freq: Once | ORAL | Status: AC
  Administered 2020-07-24: 650 mg
  Filled 2020-07-24: qty 2

## 2020-07-24 MED ORDER — ACETAMINOPHEN 325 MG PO TABS
650.0000 mg | ORAL_TABLET | Freq: Once | ORAL | Status: DC
  Filled 2020-07-24: qty 2

## 2020-07-24 MED ORDER — SODIUM CHLORIDE 0.9 % IV BOLUS
500.0000 mL | Freq: Once | INTRAVENOUS | Status: AC
  Administered 2020-07-24: 500 mL via INTRAVENOUS

## 2020-07-24 NOTE — ED Notes (Signed)
Wife at bedside reports that she was told by facility that pt was unresponsive for 15 mins.

## 2020-07-24 NOTE — ED Provider Notes (Signed)
  Physical Exam  BP (!) 156/89   Pulse 77   Temp 98.1 F (36.7 C) (Oral)   Resp (!) 23   SpO2 96%   Physical Exam  ED Course/Procedures     Procedures  MDM   Patient care assumed from Dr. Effie Shy, please see his note for a full HPI. Briefly, patient here from nursing facility with a.  Of unresponsiveness.  Was unconscious for about 10 to 15 minutes.  Obtain information from wife per prior provider.  Patient is currently at baseline.  No prior episodes of syncope.  Work-up has been unremarkable per prior provider.  Pending UA, likely disposition home.  UA without any nitrates, leukocyte, no signs of infection.  No leukocytosis.  Rest of his labs are within normal limits.  Results were discussed with wife.  No antibiotics on today's visit.  Vitals remained stable, patient is stable for disposition home at this time.   Portions of this note were generated with Scientist, clinical (histocompatibility and immunogenetics). Dictation errors may occur despite best attempts at proofreading.           Claude Manges, PA-C 07/24/20 2120    Mancel Bale, MD 07/25/20 [phone]

## 2020-07-24 NOTE — ED Triage Notes (Signed)
EMS reports facility was moving pt around 1:30 p with a Hoyer lift. Pt was unresponsive for a brief time. Pt does not recall moving. Pt was given tylenol at facility to help with back pain. Pt has hx of stroke in Dec 2021 that resulted in rt sided paralysis and slurred speech. Pt wife requested pt be sent here for evaluation and ems was called around 3p. Wife reported pt emotional state was different today. Pt has hx of afib, A&O x4. Denies abdominal pain, hx of AAA. Large bruise noted on upper right arm when pt arrived at ED. EMS reports sys bp appears lower (110)  than bp's at facility (130-150). Pt wife is on the way to hospital.

## 2020-07-24 NOTE — ED Notes (Signed)
Pt made aware of the need for urine. Urinal provided

## 2020-07-24 NOTE — ED Notes (Signed)
PEG tube in place at this time.

## 2020-07-24 NOTE — ED Notes (Signed)
Patient transported to CT 

## 2020-07-24 NOTE — ED Provider Notes (Signed)
[facility] EMERGENCY DEPARTMENT Provider Note   CSN: [id number] Arrival date & time: 07/24/20  1542     History Chief Complaint  Patient presents with  . Altered Mental Status    [name] is a 80 y.o. male.  HPI Patient was at his nursing care facility today when he had a period of unresponsiveness.  He was with caregivers, who were changing him and moving him from his bed to a chair.  Staff with patient report that he was unconscious for 10 to 15 minutes.  They contacted his wife who requested that he be transferred here for evaluation.  Wife here when I saw the patient at 4:40 PM.  She states he is at his baseline, currently.  There have been no recent illnesses.  Patient has hypertension, and has been treated with blood pressure medications.  In the ED, his blood pressure is low, 105 systolic.  Patient and wife do not know what the blood pressure typically runs.  There have been no recent illnesses including fever, vomiting, cough, abdominal or chest pain.  He has chronic pain in his buttocks, because he is is bedbound and stays in the same position a lot.  No prior episodes of syncope.  No other recent illnesses.  There are no other known modifying factors.    Past Medical History:  Diagnosis Date  . Abdominal aortic aneurysm [facility])    status post endoluminal stent graft at [facility] in 2006  . Abdominal aortic aneurysm (HCC) 2006  . Arthritis   . Coronary artery disease   . GERD (gastroesophageal reflux disease)   . History of shingles    TIMES 2  . Hyperlipidemia   . Hypertension   . MRSA (methicillin resistant Staphylococcus aureus) infection   . Numbness    right leg from knee down worse when standing   . PONV (postoperative nausea and vomiting)   . Sleep apnea    pt scored 5 per Stop Bang tool at PAT visit 10/26/2015; results sent to [facility] NP  . Vertigo     Patient Active Problem List   Diagnosis Date Noted  . Hypomagnesemia   .  Prediabetes   . Essential hypertension   . Dysphagia, post-stroke   . Right hemiplegia (HCC)   . Dysphagia due to recent stroke 06/16/2020  . OSA (obstructive sleep apnea) 06/16/2020  . Aortic atherosclerosis (HCC) 06/16/2020  . Thrombocytopenia (HCC) 06/16/2020  . 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 06/10/2020  . AAA (abdominal aortic aneurysm) without rupture (HCC) 11/27/2019  . Atrial fibrillation (HCC) 08/26/2019  . Spinal stenosis of lumbar region 11/02/2015  . Abnormal nuclear stress test   . Hyperlipidemia 12/31/2014  . HYPERTENSION, BENIGN SYSTEMIC 08/22/2006  . Coronary atherosclerosis 08/22/2006  . Thoracic aortic aneurysm [facility]) s/p stent 2006 08/22/2006    Past Surgical History:  Procedure Laterality Date  . ABDOMINAL AORTIC ANEURYSM REPAIR W/ ENDOLUMINAL GRAFT     13 years ago   . ANGIOPLASTY    . branch stenting  06/08/2004   Dr. Lavonne Chick  . CARDIAC CATHETERIZATION  06/08/2004  . CARDIOVASCULAR STRESS TEST  02/14/2009  . I&D of left knee     . IR GASTROSTOMY TUBE MOD SED  06/29/2020  . LUMBAR LAMINECTOMY/DECOMPRESSION MICRODISCECTOMY N/A 11/02/2015   Procedure: MICROLUMBAR DECOMPRESSION L3-L4, L4-L5, AND L5-S1;  Surgeon: Jene Every, MD;  Location: [facility] ORS;  Service: Orthopedics;  Laterality: N/A;  . PICC LINE PLACE PERIPHERAL ([facility]  HX)    . TRANSTHORACIC ECHOCARDIOGRAM  06/07/2004       Family History  Problem Relation Age of Onset  . Alzheimer's disease Sister   . Heart failure Maternal Grandmother   . Heart failure Maternal Grandfather   . Heart failure Paternal Grandmother   . Heart failure Paternal Grandfather   . Cancer Sister     Social History   Tobacco Use  . Smoking status: Never Smoker  . Smokeless tobacco: Never Used  Substance Use Topics  . Alcohol use: No  . Drug use: No    Home Medications Prior to Admission medications   Medication Sig Start Date End Date Taking? Authorizing Provider  acetaminophen  (TYLENOL) 160 MG/5ML solution Place 20.3 mLs (650 mg total) into feeding tube every 4 (four) hours as needed for mild pain (or temp > 37.5 C (99.5 F)). 07/07/20   Angiulli, Mcarthur Rossettianiel J, PA-C  amLODipine (NORVASC) 10 MG tablet Place 1 tablet (10 mg total) into feeding tube daily. 07/08/20   Angiulli, Mcarthur Rossettianiel J, PA-C  lisinopril (ZESTRIL) 2.5 MG tablet Place 1 tablet (2.5 mg total) into feeding tube daily. 07/08/20   Angiulli, Mcarthur Rossettianiel J, PA-C  magnesium gluconate (MAGONATE) 500 MG tablet Place 0.5 tablets (250 mg total) into feeding tube daily. 07/08/20   Angiulli, Mcarthur Rossettianiel J, PA-C  metFORMIN (GLUCOPHAGE) 500 MG tablet Place 1 tablet (500 mg total) into feeding tube daily with breakfast. 07/08/20   Angiulli, Mcarthur Rossettianiel J, PA-C  metoprolol tartrate (LOPRESSOR) 50 MG tablet Place 1 tablet (50 mg total) into feeding tube 3 (three) times daily. 07/07/20   Angiulli, Mcarthur Rossettianiel J, PA-C  Nutritional Supplements (FEEDING SUPPLEMENT, OSMOLITE 1.5 CAL,) LIQD Place 355 mLs into feeding tube 4 (four) times daily -  with meals and at bedtime. 07/07/20   Angiulli, Mcarthur Rossettianiel J, PA-C  Nutritional Supplements (FEEDING SUPPLEMENT, PROSOURCE TF,) liquid Place 45 mLs into feeding tube 3 (three) times daily. 07/07/20   Angiulli, Mcarthur Rossettianiel J, PA-C  pantoprazole sodium (PROTONIX) 40 mg/20 mL PACK Place 20 mLs (40 mg total) into feeding tube at bedtime. 07/07/20   Angiulli, Mcarthur Rossettianiel J, PA-C  STUDY - ASPIRE - apixaban 5 mg or placebo tablet (PI-Sethi) Place 1 tablet (5 mg total) into feeding tube 2 (two) times daily. 07/07/20   Angiulli, Mcarthur Rossettianiel J, PA-C  STUDY - ASPIRE - aspirin 81 mg or placebo tablet (PI-Sethi) Place 1 tablet (81 mg total) into feeding tube daily. 07/08/20   Angiulli, Mcarthur Rossettianiel J, PA-C  Water For Irrigation, Sterile (FREE WATER) SOLN Place 100 mLs into feeding tube every 4 (four) hours. 07/07/20   Angiulli, Mcarthur Rossettianiel J, PA-C    Allergies    Crestor [rosuvastatin] and Lipitor [atorvastatin]  Review of Systems   Review of Systems  All other systems  reviewed and are negative.   Physical Exam Updated Vital Signs BP 131/79   Pulse 65   Temp 98.1 F (36.7 C) (Oral)   Resp 19   SpO2 94%   Physical Exam Vitals and nursing note reviewed.  Constitutional:      General: He is not in acute distress.    Appearance: He is well-developed and well-nourished. He is not ill-appearing, toxic-appearing or diaphoretic.     Comments: Elderly, frail  HENT:     Head: Normocephalic and atraumatic.     Right Ear: External ear normal.     Left Ear: External ear normal.  Eyes:     Extraocular Movements: EOM normal.     Conjunctiva/sclera: Conjunctivae normal.  Pupils: Pupils are equal, round, and reactive to light.  Neck:     Trachea: Phonation normal.  Cardiovascular:     Rate and Rhythm: Normal rate and regular rhythm.     Heart sounds: Normal heart sounds.  Pulmonary:     Effort: Pulmonary effort is normal.     Breath sounds: Normal breath sounds.  Chest:     Chest wall: No bony tenderness.  Abdominal:     Palpations: Abdomen is soft.     Tenderness: There is no abdominal tenderness.  Musculoskeletal:        General: No swelling or tenderness.     Cervical back: Normal range of motion and neck supple.     Comments: Right-sided paraplegia, dense  Skin:    General: Skin is warm, dry and intact.  Neurological:     Mental Status: He is alert and oriented to person, place, and time.     Cranial Nerves: No cranial nerve deficit.     Sensory: No sensory deficit.     Motor: No abnormal muscle tone.  Psychiatric:        Mood and Affect: Mood and affect and mood normal.        Behavior: Behavior normal.        Thought Content: Thought content normal.        Judgment: Judgment normal.     ED Results / Procedures / Treatments   Labs (all labs ordered are listed, but only abnormal results are displayed) Labs Reviewed  BASIC METABOLIC PANEL - Abnormal; Notable for the following components:      Result Value   Sodium 132 (*)     Glucose, Bld 105 (*)    All other components within normal limits  CBC WITH DIFFERENTIAL/PLATELET  URINALYSIS, ROUTINE W REFLEX MICROSCOPIC    EKG EKG Interpretation  Date/Time:  Sunday July 24 2020 15:51:21 EST Ventricular Rate:  69 PR Interval:    QRS Duration: 88 QT Interval:  400 QTC Calculation: 429 R Axis:   -26 Text Interpretation: Atrial fibrillation Borderline left axis deviation Abnormal R-wave progression, early transition since last tracing no significant change Confirmed by Mancel Bale [id number]) on 07/24/2020 6:40:23 PM   Radiology CT Head Wo Contrast  Result Date: 07/24/2020 CLINICAL DATA:  Syncope. EXAM: CT HEAD WITHOUT CONTRAST TECHNIQUE: Contiguous axial images were obtained from the base of the skull through the vertex without intravenous contrast. COMPARISON:  CT head dated June 17, 2020 FINDINGS: Brain: No hemorrhage. No extraaxial collection.No midline shift. There is atrophy.The basal cisterns are unremarkable. Patchy and confluent areas of decreased attenuation are noted throughout the deep and periventricular white matter of the cerebral hemispheres bilaterally, compatible with chronic microvascular ischemic disease. The brainstem is unremarkable. The cerebellum is unremarkable. The sella is unremarkable. Vascular: No hyperdense vessel or unexpected calcification. Skull: The calvarium is unremarkable. The skull base is unremarkable. The visualized upper cervical spine is unremarkable. Sinuses/Orbits: The visualized orbits are unremarkable. The paranasal sinuses are unremarkable. The mastoid air cells are clear. Other: The visualized parotid gland is unremarkable. There is no scalp soft tissue swelling. IMPRESSION: 1. No acute intracranial abnormality. 2. Atrophy and chronic microvascular ischemic disease. Electronically Signed   By: Katherine Mantle M.D.   On: 07/24/2020 17:47   DG Chest Port 1 View  Result Date: 07/24/2020 CLINICAL DATA:  Syncope EXAM:  PORTABLE CHEST 1 VIEW COMPARISON:  Portable exam 1704 hours compared to 06/10/2020 FINDINGS: Enlargement of cardiac silhouette. Atherosclerotic calcification and tortuosity of  thoracic aorta with prior endoluminal stenting of the descending thoracic aorta. Slight patient rotation to the RIGHT. Aneurysmal dilatation of the aortic arch seen on the previous exam is less well demonstrated on current study due to rotation. Minimal bibasilar atelectasis. Lungs otherwise clear. No pleural effusion or pneumothorax. IMPRESSION: Minimal bibasilar atelectasis. Enlargement of cardiac silhouette post aortic stenting. Electronically Signed   By: Ulyses Southward M.D.   On: 07/24/2020 17:29    Procedures Procedures   Medications Ordered in ED Medications  sodium chloride 0.9 % bolus 500 mL (0 mLs Intravenous Stopped 07/24/20 2019)  acetaminophen (TYLENOL) tablet 650 mg (650 mg Per Tube Given 07/24/20 2105)    ED Course  I have reviewed the triage vital signs and the nursing notes.  Pertinent labs & imaging results that were available during my care of the patient were reviewed by me and considered in my medical decision making (see chart for details).    MDM Rules/Calculators/A&P                           Patient Vitals for the past 24 hrs:  BP Temp Temp src Pulse Resp SpO2  07/24/20 2000 131/79 - - 65 19 94 %  07/24/20 1930 135/85 - - 61 19 96 %  07/24/20 1900 134/86 - - (!) 44 20 96 %  07/24/20 1831 - - - 64 15 96 %  07/24/20 1830 117/87 - - - - -  07/24/20 1818 - - - 68 (!) 21 96 %  07/24/20 1817 (!) 132/91 - - - - -  07/24/20 1700 119/79 - - 68 20 98 %  07/24/20 1600 112/76 - - 67 16 97 %  07/24/20 1548 116/83 98.1 F (36.7 C) Oral 62 19 97 %    9:07 PM Reevaluation with update and discussion. After initial assessment and treatment, an updated evaluation reveals he remains comfortable.  No additional episodes of syncope.  Wife updated on findings and plan. Mancel Bale   Medical Decision  Making:  This patient is presenting for evaluation of syncope, which does require a range of treatment options, and is a complaint that involves a moderate risk of morbidity and mortality. The differential diagnoses include stroke, metabolic disorder, cardiac disorder, infectious process. I decided to review old records, and in summary elderly male, lives in a nursing care facility, is bedbound with right hemiparesis.  He presents for evaluation of dizziness followed by syncope..  I did not require additional historical information from anyone.  Clinical Laboratory Tests Ordered, included CBC, Metabolic panel and Urinalysis. Review indicates normal except sodium low, glucose high. Radiologic Tests Ordered, included CT head, chest x-ray.  I independently Visualized:, Radiographic images, which show no infiltrate or edema, no stroke  Cardiac Monitor Tracing which shows atrial fibrillation    Critical Interventions-clinical evaluation, laboratory testing, radiography, IV fluids, observation reassess  After These Interventions, the Patient was reevaluated and was found with brief episode of syncope, 10 to 15 minutes, without ongoing disability and returned to baseline mentation. Patient clinically well at time of evaluation. Screening testing ordered, reassuring.  CRITICAL CARE-no Performed by: Mancel Bale  Nursing Notes Reviewed/ Care Coordinated Applicable Imaging Reviewed Interpretation of Laboratory Data incorporated into ED treatment   Plan-patient to return home after urine testing returns.  No recent hospitalization.  He is to resume usual medications after discharge.  If the urinalysis shows infection he will be treated.  Recommend follow-up with PCP in  3 to 7 days, depending on his status.    Final Clinical Impression(s) / ED Diagnoses Final diagnoses:  Syncope, unspecified syncope type    Rx / DC Orders ED Discharge Orders    None       Mancel Bale, MD 07/24/20  2107

## 2020-07-29 ENCOUNTER — Telehealth (HOSPITAL_COMMUNITY): Payer: Self-pay | Admitting: Cardiovascular Disease

## 2020-07-29 NOTE — Telephone Encounter (Signed)
I called the designated telephone number in the system to schedule echocardiogram that Dr. Allyson Sabal ordered and they stated that he is now in a nursing home due to a stroke he had in December. Order will be removed from the echo Wq and if he still needs we can reinstate the order or create a new one.

## 2020-07-30 NOTE — Telephone Encounter (Signed)
Thx for letting me know  JJB 

## 2020-08-11 ENCOUNTER — Encounter: Payer: Self-pay | Admitting: Physical Medicine & Rehabilitation

## 2020-08-11 ENCOUNTER — Other Ambulatory Visit: Payer: Self-pay

## 2020-08-11 ENCOUNTER — Encounter: Payer: Medicare Other | Attending: Registered Nurse | Admitting: Physical Medicine & Rehabilitation

## 2020-08-11 VITALS — BP 101/65 | HR 60 | Temp 98.0°F

## 2020-08-11 DIAGNOSIS — I69391 Dysphagia following cerebral infarction: Secondary | ICD-10-CM | POA: Insufficient documentation

## 2020-08-11 DIAGNOSIS — G8111 Spastic hemiplegia affecting right dominant side: Secondary | ICD-10-CM | POA: Insufficient documentation

## 2020-08-11 NOTE — Patient Instructions (Signed)
Will need to re eval in 1 month, may need to start reducing TF

## 2020-08-11 NOTE — Progress Notes (Signed)
Subjective:    Patient ID: [name], male    DOB: September 13, 1940, 80 y.o.   MRN: [id number] 80 y.o. right-handed male with history of atrial fibrillation maintained on Eliquis, AAA status post stent graft 2006, CAD with stenting hypertension hyperlipidemia and OSA.  Per chart review lives with spouse independent prior to admission intermittent use of a cane working as a Games developer.  Presented 06/10/2020 with acute onset of right-sided weakness and dysarthria.  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  CT angiogram of head and neck bilateral calcified carotid artery atherosclerosis 65 to 70% stenosis of left ICA bulb.  No hemodynamically significant stenosis.  Suspect severely delayed basilar artery perfusion on the basis of hemodynamically significant bilateral vertebral artery stenosis.  MRI showed a 14 mm acute hemorrhage in the left ventral pons without underlying mass or change from prior tracing.  Admission chemistries urine drug screen negative troponin negative MRSA PCR screening positive.  Initially placed on hypertonic saline 3%.  Eliquis has been discontinued due to ICH.  Cleviprex for blood pressure control initially.  Dysphagia #2 honey thick liquids.  Due to patient's right side weakness dysphagia and dysarthria he was admitted for a comprehensive rehab program. Admit date: 06/16/2020 Discharge date: 07/09/2020 HPI  Patient notes increased right hand movement as well as finger movement of the right hand although is not able to use the right arm.  The patient and his wife is also noted some increased right foot motor activity, he is standing with therapy.  The patient also states that his right knee is starting to tingle.  The patient did have an ED visit for reduced responsiveness on 07/24/2020 the patient was noted to have a kidney stone he was started on antibiotics, no new neurologic deficits.  The patient denies any shoulder pain.  Still receiving tube  feeding.  No longer requiring urinary catheterization. Still receives TF, has had fruit cup, eats cherios, mashed potatoes Drinks thickened water, no choking reported with meals Weight is 192lb  D/C meds from CIR  Medications at discharge 1.  Tylenol as needed per tube 2.  Norvasc 10 mg daily per tube 3.  Glucophage 500 mg daily by tube 4.  Lopressor 50 mg 3 times daily by tube+ 5.  Protonix 40 mg daily by tube+ 6.  Study-aspire-apixaban 5 mg or placebo tablet 5 mg twice daily versus aspirin placebo 81 mg daily 7.  Lisinopril 2.5 mg daily per tube + 8.  Mg+++250 mg daily per tube+ 9.  Glucophage 500 mg daily by tube+  D/C diet from CIR  Diet: Dysphagia #1 honey thick liquids.   Osmolite 350 mL 3 times daily with meals and bedtime by PEG tube.  Prosource 45 mL 3 times daily per tube Free water 100 mL every 4 hours  Pain Inventory Average Pain 10 Pain Right Now 0 My pain is intermittent, sharp and aching  LOCATION OF PAIN  Back, groin  BOWEL Number of stools per week: 7 normally but has been constipated and impacted, bowels has been released Oral laxative use Yes  Type of laxative Docusate Sodium Enema or suppository use No  History of colostomy No  Incontinent No   BLADDER Pads In and out cath, frequency na Able to self cath na Bladder incontinence No  Frequent urination No  Leakage with coughing No  Difficulty starting stream No  Incomplete bladder emptying No    Mobility how many minutes can you walk? 0 ability  to climb steps?  no do you drive?  no use a wheelchair needs help with transfers  Function disabled: date disabled . I need assistance with the following:  dressing, bathing, toileting, meal prep, household duties and shopping  Neuro/Psych bowel control problems weakness trouble walking depression  Prior Studies HFU  Physicians involved in your care HFU   Family History  Problem Relation Age of Onset  . Alzheimer's disease Sister    . Heart failure Maternal Grandmother   . Heart failure Maternal Grandfather   . Heart failure Paternal Grandmother   . Heart failure Paternal Grandfather   . Cancer Sister    Social History   Socioeconomic History  . Marital status: Married    Spouse name: Not on file  . Number of children: Not on file  . Years of education: Not on file  . Highest education level: Not on file  Occupational History  . Not on file  Tobacco Use  . Smoking status: Never Smoker  . Smokeless tobacco: Never Used  Substance and Sexual Activity  . Alcohol use: No  . Drug use: No  . Sexual activity: Not on file  Other Topics Concern  . Not on file  Social History Narrative  . Not on file   Social Determinants of Health   Financial Resource Strain: Not on file  Food Insecurity: Not on file  Transportation Needs: Not on file  Physical Activity: Not on file  Stress: Not on file  Social Connections: Not on file   Past Surgical History:  Procedure Laterality Date  . ABDOMINAL AORTIC ANEURYSM REPAIR W/ ENDOLUMINAL GRAFT     13 years ago   . ANGIOPLASTY    . branch stenting  06/08/2004   Dr. Lavonne Chick  . CARDIAC CATHETERIZATION  06/08/2004  . CARDIOVASCULAR STRESS TEST  02/14/2009  . I&D of left knee     . IR GASTROSTOMY TUBE MOD SED  06/29/2020  . LUMBAR LAMINECTOMY/DECOMPRESSION MICRODISCECTOMY N/A 11/02/2015   Procedure: MICROLUMBAR DECOMPRESSION L3-L4, L4-L5, AND L5-S1;  Surgeon: Jene Every, MD;  Location: [facility];  Service: Orthopedics;  Laterality: N/A;  . PICC LINE PLACE PERIPHERAL ([facility] HX)    . TRANSTHORACIC ECHOCARDIOGRAM  06/07/2004   Past Medical History:  Diagnosis Date  . Abdominal aortic aneurysm [facility])    status post endoluminal stent graft at [facility] in 2006  . Abdominal aortic aneurysm (HCC) 2006  . Arthritis   . Coronary artery disease   . GERD (gastroesophageal reflux disease)   . History of shingles    TIMES 2  . Hyperlipidemia   . Hypertension   . MRSA  (methicillin resistant Staphylococcus aureus) infection   . Numbness    right leg from knee down worse when standing   . PONV (postoperative nausea and vomiting)   . Sleep apnea    pt scored 5 per Stop Bang tool at PAT visit 10/26/2015; results sent to [facility] NP  . Vertigo    BP 101/65   Pulse 60   Temp 98 F (36.7 C)   SpO2 94%   Opioid Risk Score:   Fall Risk Score:  `1  Depression screen PHQ 2/9  No flowsheet data found.  Review of Systems  Musculoskeletal: Positive for back pain.  Neurological: Positive for weakness.  Psychiatric/Behavioral: Positive for dysphoric mood.  All other systems reviewed and are negative.      Objective:   Physical Exam Vitals and nursing note reviewed.  Constitutional:  Appearance: He is obese.  HENT:     Head: Normocephalic and atraumatic.  Eyes:     General: No visual field deficit.    Extraocular Movements: Extraocular movements intact.     Conjunctiva/sclera: Conjunctivae normal.     Pupils: Pupils are equal, round, and reactive to light.  Cardiovascular:     Heart sounds: Normal heart sounds. No murmur heard.   Pulmonary:     Effort: Pulmonary effort is normal. No respiratory distress.     Breath sounds: Normal breath sounds.  Abdominal:     General: Abdomen is flat. Bowel sounds are normal. There is no distension.     Palpations: Abdomen is soft.     Tenderness: There is no abdominal tenderness.  Musculoskeletal:        General: No swelling or tenderness.     Right lower leg: No edema.     Left lower leg: No edema.  Skin:    General: Skin is warm and dry.     Comments: G-tube site with small amount of dried blood no surrounding erythema or tenderness  Neurological:     Mental Status: He is alert and oriented to person, place, and time.     Cranial Nerves: No dysarthria or facial asymmetry.     Sensory: Sensory deficit present.     Motor: Weakness and abnormal muscle tone present.     Gait: Gait abnormal.      Comments: Motor strength is 5/5 in the left deltoid, bicep, tricep, grip, hip flexor, knee extensor, ankle dorsiflexor and plantar flexor. 0/5 right deltoid bicep tricep, trace finger flexors 0/5 in the right hip flexor knee extensor ankle dorsiflexor.  There is trace ankle plantar flexor on the right Sensation is reduced to light touch he does sense the small finger but not the thumb   Tone right upper extremity MAS 2 at the elbow flexors 1 at the finger flexors MAS 2 at the knee flexors on the right   D/C motor exam from CIR Neuro.  Awake alert severely dysarthric but intelligible follows commands right facial droop.  Right upper extremity 0/5 right lower extremity 2/5 left side 4+/5      Assessment & Plan:  #1.  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  Continue PT OT 1 hour/day at skilled nursing facility. He has had little motor recovery thus far and unfortunately prognosis for further improvement is guarded   2.  Dysphagia post stroke, still on G-tube feedings as well as p.o. feedings with modified diet.  Repeat modified barium swallow.  If patient's intake improves can slowly start reducing tube feeding volumes.  If take patient takes at least 50% of meal would reduce tube feed to 50% of current volume  Return to clinic 1 month  Would recommend follow-up with neurology as well for the aSpire trial

## 2020-08-15 ENCOUNTER — Other Ambulatory Visit: Payer: Self-pay

## 2020-08-15 ENCOUNTER — Encounter: Payer: Self-pay | Admitting: Adult Health

## 2020-08-15 ENCOUNTER — Ambulatory Visit (INDEPENDENT_AMBULATORY_CARE_PROVIDER_SITE_OTHER): Payer: Medicare Other | Admitting: Adult Health

## 2020-08-15 VITALS — BP 125/72 | HR 79 | Ht 74.0 in

## 2020-08-15 DIAGNOSIS — I613 Nontraumatic intracerebral hemorrhage in brain stem: Secondary | ICD-10-CM | POA: Diagnosis not present

## 2020-08-15 NOTE — Progress Notes (Signed)
[facility] [address] [place]. [place] [address] [phone]       HOSPITAL FOLLOW UP NOTE  [name] Date of Birth:  23-May-1941 Medical Record Number:  [id number]   Reason for Referral:  hospital stroke follow up    SUBJECTIVE:   CHIEF COMPLAINT:  Chief Complaint  Patient presents with  . Follow-up    TR with wife([name]) Pt states he is doing bad, neither limb on R side is mobile. Dizziness/ double vision per wife    HPI:   [name] a 80 y.o.malewith history of atrial fibrillation (on Eliquis - but missed last evening dose), AAA s/p stent graft in 2006, CAD, shingles x 2, HLD, HTN, sleep apnea and vertigowho presented to [facility] ED on 06/10/2020 with severe Rt sided weakness, facial droop and severe dysarthria. Personally reviewed hospitalization pertinent progress notes, lab work and imaging.  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  CTA head/neck significant b/l vertebral artery stenosis and left ICA 65 to 70% stenosis.  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  Chronic A. fib on Eliquis PTA -due to ICH and chronic microhemorrhages he was deemed to not be appropriate anticoagulation candidate.  Other stroke risk factors include HTN, CAD, OSA and AAA s/p stent in 2006.  Residual deficits of severe dysarthria, dense hemiplegia and dysphagia.  ICH: acute inferior pontine hemorrhage likely due to hypertension in the setting of Eliquis coagulopathy   CT Head - Positive for acute pontine hemorrhage,13 mm (1 mL). No mass effect, extra-axial- or intraventricular extension of blood at this time. 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 Hemodynamically significant bilateral vertebral artery stenoses(bilateral distal V4 and also dominant Left vertebral origin). Bilateral calcified carotid artery atherosclerosis, 65-70% stenosis at the Left ICA bulb.  MRI head ventral pontine acute hemorrhage 14 mm. Numerous chronic microhemorrhage bilaterally likely hypertensive   CT head repeat -Unchanged small focus of hemorrhage in the brainstem.  2D Echo - EF 60 - 65%. No cardiac source of emboli identified.   Sars Corona Virus 2 - negative  LDL - 84  HgbA1c-6.1  UDS - neg  VTE prophylaxis - SCDs  Eliquis (apixaban) dailyprior to admission, now on No antithrombotic  Ongoing aggressive stroke risk factor management. Given Amyloid and ICH, anticoagulation not recommended  Therapy recommendations: CIR  Disposition: CIR on 06/16/2020  Chronic afib  On eliquis PTA  Received Kcentra reversal   Now on no antithrombotics  Given MRI showing numerous chronic microhemorrhages bilaterally, concerning for hypertensive versus cerebral amyloid, likely not to be future anticoagulation candidate  Today, 08/15/2020, [name] is being seen for hospital follow-up accompanied by his wife.  He was discharged from CIR to [facility] on 07/08/2020 due to continued residual deficits including right spastic hemiparesis, dysarthria and dysphagia s/p gastrostomy tube 1/5 (during CIR admission).  He continues to receive nightly supplemental nutrition and is on a softened diet with thickened liquids during the day.  He plans on repeating swallowing eval and currently awaiting to schedule study.  He remains nonambulatory and transfers via wheelchair.  Currently working with PT/OT/SLP.  Denies new or worsening stroke/TIA symptoms.  He   Continues to participate in ASPIRE trial where he is  either on aspirin or apixaban although per review of facility  MAR, both aspirin and apixaban prescribed.  He has been having ongoing issues with hematuria which was told from traumatic Foley catheter insertion for urine retention.  He continues to experience mild hematuria but overall improving.  He was evaluated by urology today. Blood pressure today 125/72  [facility] ED on 07[facility]24/2020 after episode of dizziness followed by unresponsiveness for 10-15 min while SNF staff transitioning him from bed to chair.  No additional syncopal events.  Unclear/unknown etiology.      ROS:   14 system review of systems performed and negative with exception of those listed in HPI  PMH:  Past Medical History:  Diagnosis Date  . Abdominal aortic aneurysm [facility](HCC)    status [facility] in [facility]  . Abdominal aortic aneurysm (HCC) 2006  . Arthritis   . Coronary artery disease   . GERD (gastroesophageal reflux disease)   . History of shingles    TIMES 2  . Hyperlipidemia   . Hypertension   . MRSA (methicillin resistant Staphylococcus aureus) infection   . Numbness    right leg from knee down worse when standing   . PONV (postoperative nausea and vomiting)   . Sleep apnea    pt scored 5 per Stop Bang tool at PAT visit 10/26/2015; [facility]  . Vertigo     PSH:  Past Surgical History:  Procedure Laterality Date  . ABDOMINAL AORTIC ANEURYSM REPAIR W/ ENDOLUMINAL GRAFT     13 years ago   . ANGIOPLASTY    . branch stenting  06/08/2004   Dr. Lavonne ChickBill Gamble  . CARDIAC CATHETERIZATION  06/08/2004  . CARDIOVASCULAR STRESS TEST  02/14/2009  . I&D of left knee     . IR GASTROSTOMY TUBE MOD SED  06/29/2020  . LUMBAR LAMINECTOMY/DECOMPRESSION MICRODISCECTOMY N/A 11/02/2015   Procedure: MICROLUMBAR DECOMPRESSION L3-L4, L4-L5, AND L5-S1;  Surgeon: Jene EveryJeffrey Beane, MD;  Location: [facility];  Service: Orthopedics;  Laterality: N/A;  . PICC LINE PLACE PERIPHERAL ([facility] HX)    . TRANSTHORACIC ECHOCARDIOGRAM  06/07/2004     Social History:  Social History   Socioeconomic History  . Marital status: Married    Spouse name: Not on file  . Number of children: Not on file  . Years of education: Not on file  . Highest education level: Not on file  Occupational History  . Not on file  Tobacco Use  . Smoking status: Never Smoker  . Smokeless tobacco: Never Used  Substance and Sexual Activity  . Alcohol use: No  . Drug use: No  . Sexual activity: Not on file  Other Topics Concern  . Not on file  Social History Narrative  . Not on file   Social Determinants of Health   Financial Resource Strain: Not on file  Food Insecurity: Not on file  Transportation Needs: Not on file  Physical Activity: Not on file  Stress: Not on file  Social Connections: Not on file  Intimate Partner Violence: Not on file    Family History:  Family History  Problem Relation Age of Onset  . Alzheimer's disease Sister   . Heart failure Maternal Grandmother   . Heart failure Maternal Grandfather   . Heart failure Paternal Grandmother   . Heart failure Paternal Grandfather   . Cancer Sister     Medications:   Current Outpatient Medications on File Prior to Visit  Medication Sig Dispense Refill  . acetaminophen (TYLENOL) 160 MG/5ML solution Place 20.3 mLs (650 mg total) into feeding tube every 4 (four) hours as needed for mild pain (or temp > 37.5 C (99.5 F)). 120 mL 0  . amLODipine (NORVASC) 10 MG tablet Place 1 tablet (10 mg total) into feeding tube daily.    Marland Kitchen docusate sodium (COLACE) 100 MG capsule Take 100 mg by mouth 2 (two) times daily.    . finasteride (PROSCAR) 5 MG tablet Take 5 mg by mouth daily.    Marland Kitchen lisinopril (ZESTRIL) 2.5 MG tablet Place 1 tablet (2.5 mg total) into feeding tube daily.    . magnesium gluconate (MAGONATE) 500 MG tablet Place 0.5 tablets (250 mg total) into feeding tube daily.    . metFORMIN (GLUCOPHAGE) 500 MG tablet Place 1 tablet (500 mg total) into feeding tube daily with breakfast.     . metoprolol tartrate (LOPRESSOR) 50 MG tablet Place 1 tablet (50 mg total) into feeding tube 3 (three) times daily.    . Nutritional Supplements (FEEDING SUPPLEMENT, OSMOLITE 1.5 CAL,) LIQD Place 355 mLs into feeding tube 4 (four) times daily -  with meals and at bedtime.  0  . Nutritional Supplements (FEEDING SUPPLEMENT, PROSOURCE TF,) liquid Place 45 mLs into feeding tube 3 (three) times daily.    . pantoprazole sodium (PROTONIX) 40 mg/20 mL PACK Place 20 mLs (40 mg total) into feeding tube at bedtime. 30 mL   . STUDY - ASPIRE - apixaban 5 mg or placebo tablet (PI-Sethi) Place 1 tablet (5 mg total) into feeding tube 2 (two) times daily. 30 tablet 0  . STUDY - ASPIRE - aspirin 81 mg or placebo tablet (PI-Sethi) Place 1 tablet (81 mg total) into feeding tube daily. 30 tablet 0  . traMADol (ULTRAM) 50 MG tablet Take by mouth every 6 (six) hours as needed.    . Water For Irrigation, Sterile (FREE WATER) SOLN Place 100 mLs into feeding tube every 4 (four) hours.     No current facility-administered medications on file prior to visit.    Allergies:   Allergies  Allergen Reactions  . Crestor [Rosuvastatin] Other (See Comments)    Severe pain  . Lipitor [Atorvastatin] Other (See Comments)    Severe pain      OBJECTIVE:  Physical Exam  Vitals:   08/15/20 1356  BP: 125/72  Pulse: 79  Height: 6\' 2"  (1.88 m)   Body mass index is 27.31 kg/m. No exam data present  General: well developed, well nourished,  very pleasant elderly Caucasian male, seated, in no evident distress Head: head normocephalic and atraumatic.   Neck: supple with no carotid or supraclavicular bruits Cardiovascular: regular rate and rhythm, no murmurs Musculoskeletal: no deformity Skin:  no rash/petichiae Vascular:  Normal pulses all extremities   Neurologic Exam Mental Status: Awake and fully alert.   Mild dysarthria without evidence of aphasia.  Oriented to place and time. Recent and remote memory intact  during visit. Attention span, concentration and fund of knowledge appropriate during visit. Mood and affect appropriate.  Cranial Nerves: Fundoscopic exam reveals sharp disc margins. Pupils equal, briskly reactive to light. Extraocular movements full without nystagmus. Visual fields full to confrontation. Hearing intact. Facial sensation intact.  Right lower facial weakness.  Tongue, and palate moves normally and symmetrically.  Motor: Normal bulk and tone and strength left side.  RUE: 1/5 deltoid and elbow flexion, 3/5 elbow extension, decreased hand mobility with limited grip; RLE: 1/5 HF and ankle dorsiflexion,  2/5  Hip abductor/adductor, knee flexion and plantar flexion, 3/5 knee extension Sensory.: intact to touch , pinprick , position and vibratory sensation.  Coordination: Rapid alternating movements normal on leftside. Finger-to-nose and heel-to-shin performed accurately on left side. Gait and Station: Deferred as patient nonambulatory Reflexes: 2+ RUE and RLE and 1+ LUE and LLE. Toes downgoing.     NIHSS  8 Modified Rankin  4      ASSESSMENT: [name] is a 80 y.o. year old male presented with acute onset right-sided weakness and dysarthria on 06/10/2020 with stroke work-up revealing acute inferior pontine hemorrhage likely secondary to hypertension in setting of Eliquis coagulopathy requiring reversal with Kcentra. Vascular risk factors include b/l vertebral artery stenosis, left carotid stenosis, numerous chronic microhemorrhages (HTN vs CAA), chronic A. fib, HTN, HLD, advanced age, CAD, OSA and AAA s/p stenting 2006.      PLAN:  1. Pontine ICH:  a. Residual deficit: Right spastic hemiparesis, dysarthria and dysphagia.  Resides at [facility] -continue working with PT/OT/SLP.  Plans on repeating barium swallow study with current use of G-tube for medications and supplemental nutrition.   b. Currently participating in ASPIIRE trial through [facility] - discussed faciliy MAR with  Rizwan ([facility] research) regarding listed use of Eliquis and aspirin -he plans on contacting pharmacy and facility to ensure current use of study medications c. Discussed secondary stroke prevention measures and importance of close PCP follow up for aggressive stroke risk factor management  2. Atrial fibrillation: Currently participating in ASPIRE trial thru [facility] research on Eliquis OR aspirin.  Continue to follow with cardiology for routine follow-up 3. HTN: BP goal <130/90.  Currently well controlled on current regimen per PCP 4. HLD: LDL goal <70. Recent LDL 84.  Not currently on statin. Will discuss initiating at follow-up visit    Follow up in 3 months or call earlier if needed   CC:  [facility] provider: Dr. Kreg Shropshire, Orie Rout, NP    I spent 45 minutes of face-to-face and non-face-to-face time with patient and wife.  This included previsit chart review including recent hospitalization pertinent progress notes, lab work and imaging, lab review, study review, order entry, electronic health record documentation, patient and wife discussion and education regarding recent stroke including etiology, residual deficits, importance of managing stroke risk factors and answered all other questions to patient and wife's satisfaction  Ihor Austin, AGNP-BC  [facility] [address] [place], [place] [address]  Phone [phone] Fax [phone] Note: This document was prepared with digital dictation and possible smart phrase technology. Any transcriptional errors that result from this process are unintentional.

## 2020-08-15 NOTE — Patient Instructions (Signed)
Continue working with therapies for hopeful ongoing recovery   Continue Aspire trial medications (either Eliquis or aspirin) for secondary stroke prevention  Continue to follow up with PCP regarding cholesterol and blood pressure management  Maintain strict control of hypertension with blood pressure goal below 130/90 and cholesterol with LDL cholesterol (bad cholesterol) goal below 70 mg/dL.       Followup in the future with me in 3 months or call earlier if needed       Thank you for coming to see [place] at [facility]. I hope we have been able to provide you high quality care today.  You may receive a patient satisfaction survey over the next few weeks. We would appreciate your feedback and comments so that we may continue to improve ourselves and the health of our patients.

## 2020-08-16 NOTE — Progress Notes (Signed)
I agree with the above plan 

## 2020-09-08 ENCOUNTER — Encounter: Payer: Self-pay | Admitting: Physical Medicine & Rehabilitation

## 2020-09-08 ENCOUNTER — Other Ambulatory Visit: Payer: Self-pay

## 2020-09-08 ENCOUNTER — Encounter: Payer: Medicare Other | Attending: Registered Nurse | Admitting: Physical Medicine & Rehabilitation

## 2020-09-08 VITALS — BP 133/79 | HR 73 | Temp 97.6°F | Ht 74.0 in | Wt 197.9 lb

## 2020-09-08 DIAGNOSIS — I69391 Dysphagia following cerebral infarction: Secondary | ICD-10-CM | POA: Insufficient documentation

## 2020-09-08 DIAGNOSIS — I613 Nontraumatic intracerebral hemorrhage in brain stem: Secondary | ICD-10-CM | POA: Diagnosis present

## 2020-09-08 NOTE — Patient Instructions (Signed)
CONT SAME TUBE FEEDING SCHEDULE UNTIL NEXT VISIT IF YOU GAIN WEIGHT WE WILL REDUCE TUBE FEEDINGS

## 2020-09-08 NOTE — Progress Notes (Signed)
Subjective:    Patient ID: [name], male    DOB: 1940-09-17, 80 y.o.   MRN: [id number] 80 y.o. right-handed male with history of atrial fibrillation maintained on Eliquis, AAA status post stent graft 2006, CAD with stenting hypertension hyperlipidemia and OSA.  Per chart review lives with spouse independent prior to admission intermittent use of a cane working as a Games developer.  Presented 06/10/2020 with acute onset of right-sided weakness and dysarthria.  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  CT angiogram of head and neck bilateral calcified carotid artery atherosclerosis 65 to 70% stenosis of left ICA bulb.  No hemodynamically significant stenosis.  Suspect severely delayed basilar artery perfusion on the basis of hemodynamically significant bilateral vertebral artery stenosis.  MRI showed a 14 mm acute hemorrhage in the left ventral pons without underlying mass or change from prior tracing.  Admission chemistries urine drug screen negative troponin negative MRSA PCR screening positive.  Initially placed on hypertonic saline 3%.  Eliquis has been discontinued due to ICH.  Cleviprex for blood pressure control initially.  Dysphagia #2 honey thick liquids.  Due to patient's right side weakness dysphagia and dysarthria he was admitted for a comprehensive rehab program. Admit date: 06/16/2020 Discharge date: 07/09/2020 HPI Still at SNF, planning to hire caregivers at home.  Thinks he will leave SNF beginning of April Doesn't like food at [facility] but sometime eats at least 50%.  Wife brings food from home  Non ambulatory, can transfer with help, pt states he can don his shirt but not his pants. Patient is in a wheelchair today and stated he mainly stays in the wheelchair.  Patient requires [facility] lifts for wheelchair transfers  Passed MBS and is now on thin liquids per pt but SNF records show nectar liquids with soft diet   Pain Inventory Average Pain 0 Pain  Right Now 8 My pain is intermittent, aching and when sitting in wheel chair  In the last 24 hours, has pain interfered with the following? General activity 0 Relation with others 0 Enjoyment of life 0 What TIME of day is your pain at its worst? varies Sleep (in general) Fair  Pain is worse with: sitting Pain improves with: rest Relief from Meds: na  Family History  Problem Relation Age of Onset  . Alzheimer's disease Sister   . Heart failure Maternal Grandmother   . Heart failure Maternal Grandfather   . Heart failure Paternal Grandmother   . Heart failure Paternal Grandfather   . Cancer Sister    Social History   Socioeconomic History  . Marital status: Married    Spouse name: Not on file  . Number of children: Not on file  . Years of education: Not on file  . Highest education level: Not on file  Occupational History  . Not on file  Tobacco Use  . Smoking status: Never Smoker  . Smokeless tobacco: Never Used  Substance and Sexual Activity  . Alcohol use: No  . Drug use: No  . Sexual activity: Not on file  Other Topics Concern  . Not on file  Social History Narrative  . Not on file   Social Determinants of Health   Financial Resource Strain: Not on file  Food Insecurity: Not on file  Transportation Needs: Not on file  Physical Activity: Not on file  Stress: Not on file  Social Connections: Not on file   Past Surgical History:  Procedure Laterality Date  .  ABDOMINAL AORTIC ANEURYSM REPAIR W/ ENDOLUMINAL GRAFT     13 years ago   . ANGIOPLASTY    . branch stenting  06/08/2004   Dr. Lavonne Chick  . CARDIAC CATHETERIZATION  06/08/2004  . CARDIOVASCULAR STRESS TEST  02/14/2009  . I&D of left knee     . IR GASTROSTOMY TUBE MOD SED  06/29/2020  . LUMBAR LAMINECTOMY/DECOMPRESSION MICRODISCECTOMY N/A 11/02/2015   Procedure: MICROLUMBAR DECOMPRESSION L3-L4, L4-L5, AND L5-S1;  Surgeon: Jene Every, MD;  Location: [facility];  Service: Orthopedics;  Laterality: N/A;  .  PICC LINE PLACE PERIPHERAL ([facility] HX)    . TRANSTHORACIC ECHOCARDIOGRAM  06/07/2004   Past Surgical History:  Procedure Laterality Date  . ABDOMINAL AORTIC ANEURYSM REPAIR W/ ENDOLUMINAL GRAFT     13 years ago   . ANGIOPLASTY    . branch stenting  06/08/2004   Dr. Lavonne Chick  . CARDIAC CATHETERIZATION  06/08/2004  . CARDIOVASCULAR STRESS TEST  02/14/2009  . I&D of left knee     . IR GASTROSTOMY TUBE MOD SED  06/29/2020  . LUMBAR LAMINECTOMY/DECOMPRESSION MICRODISCECTOMY N/A 11/02/2015   Procedure: MICROLUMBAR DECOMPRESSION L3-L4, L4-L5, AND L5-S1;  Surgeon: Jene Every, MD;  Location: [facility];  Service: Orthopedics;  Laterality: N/A;  . PICC LINE PLACE PERIPHERAL ([facility] HX)    . TRANSTHORACIC ECHOCARDIOGRAM  06/07/2004   Past Medical History:  Diagnosis Date  . Abdominal aortic aneurysm [facility])    status post endoluminal stent graft at [facility] in 2006  . Abdominal aortic aneurysm (HCC) 2006  . Arthritis   . Coronary artery disease   . GERD (gastroesophageal reflux disease)   . History of shingles    TIMES 2  . Hyperlipidemia   . Hypertension   . MRSA (methicillin resistant Staphylococcus aureus) infection   . Numbness    right leg from knee down worse when standing   . PONV (postoperative nausea and vomiting)   . Sleep apnea    pt scored 5 per Stop Bang tool at PAT visit 10/26/2015; results sent to [facility] NP  . Vertigo    BP 133/79   Pulse 73   Temp 97.6 F (36.4 C)   Ht 6\' 2"  (1.88 m) Comment: last recorded  Wt 212 lb 11.8 oz (96.5 kg) Comment: last weight recorded  BMI 27.31 kg/m   Opioid Risk Score:   Fall Risk Score:  `1  Depression screen PHQ 2/9  Depression screen PHQ 2/9 08/11/2020  Decreased Interest 1  Down, Depressed, Hopeless 3  PHQ - 2 Score 4  Altered sleeping 1  Tired, decreased energy 3  Change in appetite 2  Feeling bad or failure about yourself  1  Trouble concentrating 1  Moving slowly or fidgety/restless 2  Suicidal thoughts 0   PHQ-9 Score 14  Difficult doing work/chores Somewhat difficult    Review of Systems  Constitutional: Negative.   HENT: Negative.   Eyes: Negative.   Respiratory: Negative.   Cardiovascular: Negative.   Gastrointestinal:       PEG tube for feeding and meds  Musculoskeletal: Positive for back pain.       Transient only when having to sit in wheelchair  Skin: Negative.   Allergic/Immunologic: Negative.   Neurological: Positive for weakness.       Right side  Hematological: Negative.   Psychiatric/Behavioral: Negative.   All other systems reviewed and are negative.      Objective:   Physical Exam Vitals and nursing note reviewed.  Constitutional:      Appearance: He is normal weight.  HENT:     Head: Normocephalic and atraumatic.  Eyes:     Extraocular Movements: Extraocular movements intact.     Conjunctiva/sclera: Conjunctivae normal.     Pupils: Pupils are equal, round, and reactive to light.  Abdominal:     General: Abdomen is flat. Bowel sounds are normal. There is no distension.     Palpations: Abdomen is soft.     Tenderness: There is no abdominal tenderness.     Comments: PEG site clean dry and intact no evidence of drainage no erythema or induration, no pain to palpation  Skin:    General: Skin is warm and dry.  Neurological:     Mental Status: He is alert and oriented to person, place, and time.     Cranial Nerves: Dysarthria present.     Motor: No tremor or abnormal muscle tone.     Gait: Gait abnormal.     Comments: The patient does not give history that is substantiated by medical record.  Minimizes his deficits  Motor strength is to minus at the finger flexors and elbow extensors on the right side otherwise 0 trace hip knee extensor synergy on the right side 5/5 strength in the left upper limb and left lower limb Sensation intact to light touch bilateral upper and lower limbs Speech with mild dysarthria  Psychiatric:        Mood and Affect: Mood  normal.           Assessment & Plan:  #1.  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  No significant motor improvements over the last month.  Has finished SNF PT OT but would benefit from home health PT OT after discharge from SNF  #2.  Dysphagia status post gastrostomy tube.  We will continue current tube feeding approximately 540 mL of Osmolite 1.5 administered overnight.  Weight today was calculated at 197 pounds subtracting the weight of wheelchair.  Will monitor weight at next visit and if he is gaining, will stop tube feedings and monitor for another month prior to discontinuing tube

## 2020-09-29 ENCOUNTER — Ambulatory Visit: Payer: Medicare Other | Admitting: Adult Health

## 2020-10-06 ENCOUNTER — Ambulatory Visit: Payer: Medicare Other | Admitting: Adult Health

## 2020-10-11 ENCOUNTER — Ambulatory Visit: Payer: Medicare Other | Admitting: Adult Health

## 2020-10-13 ENCOUNTER — Encounter
Payer: No Typology Code available for payment source | Attending: Registered Nurse | Admitting: Physical Medicine & Rehabilitation

## 2020-10-13 DIAGNOSIS — I69391 Dysphagia following cerebral infarction: Secondary | ICD-10-CM | POA: Insufficient documentation

## 2020-10-13 DIAGNOSIS — I613 Nontraumatic intracerebral hemorrhage in brain stem: Secondary | ICD-10-CM | POA: Insufficient documentation

## 2020-11-09 ENCOUNTER — Ambulatory Visit: Payer: Medicare Other | Admitting: Adult Health

## 2020-11-29 ENCOUNTER — Ambulatory Visit: Payer: Medicare Other | Admitting: Cardiovascular Disease

## 2021-01-24 ENCOUNTER — Ambulatory Visit (INDEPENDENT_AMBULATORY_CARE_PROVIDER_SITE_OTHER): Payer: No Typology Code available for payment source | Admitting: Adult Health

## 2021-01-24 ENCOUNTER — Encounter: Payer: Self-pay | Admitting: Adult Health

## 2021-01-24 VITALS — BP 159/96 | HR 74 | Ht 74.0 in

## 2021-01-24 DIAGNOSIS — I10 Essential (primary) hypertension: Secondary | ICD-10-CM

## 2021-01-24 DIAGNOSIS — I480 Paroxysmal atrial fibrillation: Secondary | ICD-10-CM | POA: Diagnosis not present

## 2021-01-24 DIAGNOSIS — I613 Nontraumatic intracerebral hemorrhage in brain stem: Secondary | ICD-10-CM

## 2021-01-24 NOTE — Progress Notes (Signed)
[facility] [address] [place]. [place] [address] [phone]       STROKE FOLLOW UP NOTE  Mr. Charon Akamine Date of Birth:  23-Jan-1941 Medical Record Number:  [id number]   Reason for Referral: stroke follow up    SUBJECTIVE:   CHIEF COMPLAINT:  Chief Complaint  Patient presents with   Pontine hemorrhage    Rm 3 Research patient, wifeLiborio Nixon     HPI:   Today, 01/24/2021, Mr. Saleeby returns for prolonged stroke follow-up after prior visit 6 months ago.  He has since returned back home 4/1. He goes to [facility] 4 days/week where he has been receiving therapies. Reports residual right spastic hemiparesis, dysarthria and dysphagia - per wife, not much improvement since prior visit.  Wife is concerned that he is no longer on baclofen -contacted facility who reported somnolence on baclofen therefore discontinued.  He has not had routine follow-up with Dr. Wynn Banker.  Spasticity painful at times at night but otherwise no pain just more stiffness.  He remains nonambulatory and transfers via [facility] lift.  His wife is his primary caregiver at home.  Still has PEG tube - had swallow eval at NH back in Feb/March per wife. Able to feed himself - will not drink thickened liquid - will have difficulty at times with thin liquids. Doing okay with food - able to eat softer foods cut in small pieces. Has difficulty with chewier foods. Does not receive supplementale nutrition any longer - is followed by nutrition at [facility].  Denies new or worsening stroke/TIA symptoms. Continues to participate in ASPIRE trial through [facility] research on either aspirin OR Eliquis.  Blood pressure today 159/96.  No further concerns at this time.     History provided for reference purposes only Initial visit 08/15/2020: Mr. Erbes is being seen for hospital follow-up accompanied by his wife.  He was discharged from CIR to [facility] on 07/08/2020 due to continued residual  deficits including right spastic hemiparesis, dysarthria and dysphagia s/p gastrostomy tube 1/5 (during CIR admission).  He continues to receive nightly supplemental nutrition and is on a softened diet with thickened liquids during the day.  He plans on repeating swallowing eval and currently awaiting to schedule study.  He remains nonambulatory and transfers via wheelchair.  Currently working with PT/OT/SLP.  Denies new or worsening stroke/TIA symptoms.   Continues to participate in ASPIRE trial where he is either on aspirin or apixaban although per review of facility MAR, both aspirin and apixaban prescribed.  He has been having ongoing issues with hematuria which was told from traumatic Foley catheter insertion for urine retention.  He continues to experience mild hematuria but overall improving.  He was evaluated by urology today. Blood pressure today 125/72  Evaluated at [facility] ED on 07/24/2020 after episode of dizziness followed by unresponsiveness for 10-15 min while SNF staff transitioning him from bed to chair.  No additional syncopal events.  Unclear/unknown etiology.  Stroke admission 06/10/2020 Mr. Henryk Ursin is a 80 y.o. male with history of  atrial fibrillation (on Eliquis - but missed last evening dose), AAA s/p stent graft in 2006, CAD, shingles x 2, HLD, HTN, sleep apnea and vertigo who presented to [facility] ED on 06/10/2020 with severe Rt sided weakness, facial droop and severe dysarthria.  Personally reviewed hospitalization pertinent progress notes, lab work and imaging.  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  CTA  head/neck significant b/l vertebral artery stenosis and left ICA 65 to 70% stenosis.  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  Chronic A. fib on Eliquis PTA -due to ICH and chronic  microhemorrhages he was deemed to not be appropriate anticoagulation candidate.  Other stroke risk factors include HTN, CAD, OSA and AAA s/p stent in 2006.  Residual deficits of severe dysarthria, dense hemiplegia and dysphagia.  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 No mass effect, extra-axial- or intraventricular extension of blood at this time. 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 Hemodynamically significant bilateral vertebral artery stenoses (bilateral distal V4 and also dominant Left vertebral origin). Bilateral calcified carotid artery atherosclerosis, 65-70% stenosis at the Left ICA bulb. MRI head ventral pontine acute hemorrhage 14 mm.  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 2D Echo - EF 60 - 65%. No cardiac source of emboli identified.  Sars Corona Virus 2 - negative LDL - 84 HgbA1c - 6.1 UDS - neg VTE prophylaxis - SCDs Eliquis (apixaban) daily prior to admission, now on No antithrombotic Ongoing aggressive stroke risk factor management. Given Amyloid and ICH, anticoagulation not recommended Therapy recommendations: CIR Disposition:  CIR on 06/16/2020  Chronic afib On eliquis PTA Received Kcentra reversal  Now on no antithrombotics Given MRI showing numerous chronic microhemorrhages bilaterally, concerning for hypertensive versus cerebral amyloid, likely not to be future anticoagulation candidate     ROS:   14 system review of systems performed and negative with exception of those listed in HPI  PMH:  Past Medical History:  Diagnosis Date    Abdominal aortic aneurysm (HCC)    status post endoluminal stent graft at [facility] in 2006   Abdominal aortic aneurysm (HCC) 2006   Arthritis    Coronary artery disease    GERD (gastroesophageal reflux disease)    History of shingles    TIMES 2   Hyperlipidemia    Hypertension    MRSA (methicillin resistant Staphylococcus aureus) infection    Numbness    right leg from knee down worse when standing    PONV (postoperative nausea and vomiting)    Sleep apnea    pt scored 5 per Stop Bang tool at PAT visit 10/26/2015; results sent to Mauricio Po NP   Vertigo     PSH:  Past Surgical History:  Procedure Laterality Date   ABDOMINAL AORTIC ANEURYSM REPAIR W/ ENDOLUMINAL GRAFT     13 years ago    ANGIOPLASTY     branch stenting  06/08/2004   Dr. Lavonne Chick   CARDIAC CATHETERIZATION  06/08/2004   CARDIOVASCULAR STRESS TEST  02/14/2009   I&D of left knee      IR GASTROSTOMY TUBE MOD SED  06/29/2020   LUMBAR LAMINECTOMY/DECOMPRESSION MICRODISCECTOMY N/A 11/02/2015   Procedure: MICROLUMBAR DECOMPRESSION L3-L4, L4-L5, AND L5-S1;  Surgeon: Jene Every, MD;  Location: [facility];  Service: Orthopedics;  Laterality: N/A;   PICC LINE PLACE PERIPHERAL ([facility] HX)     TRANSTHORACIC ECHOCARDIOGRAM  06/07/2004    Social History:  Social History   Socioeconomic History   Marital status: Married  Spouse name: Not on file   Number of children: Not on file   Years of education: Not on file   Highest education level: Not on file  Occupational History   Not on file  Tobacco Use   Smoking status: Never   Smokeless tobacco: Never  Substance and Sexual Activity   Alcohol use: No   Drug use: No   Sexual activity: Not on file  Other Topics Concern   Not on file  Social History Narrative   01/24/21 lives with wife   Social Determinants of Health   Financial Resource Strain: Not on file  Food Insecurity: Not on file  Transportation Needs: Not on file  Physical Activity: Not on file   Stress: Not on file  Social Connections: Not on file  Intimate Partner Violence: Not on file    Family History:  Family History  Problem Relation Age of Onset   Alzheimer's disease Sister    Heart failure Maternal Grandmother    Heart failure Maternal Grandfather    Heart failure Paternal Grandmother    Heart failure Paternal Grandfather    Cancer Sister     Medications:   Current Outpatient Medications on File Prior to Visit  Medication Sig Dispense Refill   acetaminophen (TYLENOL) 160 MG/5ML solution Place 20.3 mLs (650 mg total) into feeding tube every 4 (four) hours as needed for mild pain (or temp > 37.5 C (99.5 F)). 120 mL 0   Cholecalciferol (DIALYVITE VITAMIN D3 MAX) 1.25 MG (50000 UT) TABS Take 1 tablet by mouth once a week.     docusate sodium (COLACE) 100 MG capsule Take 100 mg by mouth 2 (two) times daily.     finasteride (PROSCAR) 5 MG tablet Take 5 mg by mouth daily.     gabapentin (NEURONTIN) 100 MG capsule Take 100 mg by mouth 2 (two) times daily.     magnesium oxide (MAG-OX) 400 MG tablet Take 400 mg by mouth daily.     metoprolol tartrate (LOPRESSOR) 50 MG tablet Place 1 tablet (50 mg total) into feeding tube 3 (three) times daily.     mirtazapine (REMERON) 7.5 MG tablet Take 7.5 mg by mouth at bedtime.     pantoprazole sodium (PROTONIX) 40 mg/20 mL PACK Place 20 mLs (40 mg total) into feeding tube at bedtime. 30 mL    STUDY - ASPIRE - apixaban 5 mg or placebo tablet (PI-Sethi) Place 1 tablet (5 mg total) into feeding tube 2 (two) times daily. 30 tablet 0   STUDY - ASPIRE - aspirin 81 mg or placebo tablet (PI-Sethi) Place 1 tablet (81 mg total) into feeding tube daily. 30 tablet 0   amLODipine (NORVASC) 10 MG tablet Place 1 tablet (10 mg total) into feeding tube daily. (Patient not taking: Reported on 01/24/2021)     lisinopril (ZESTRIL) 2.5 MG tablet Place 1 tablet (2.5 mg total) into feeding tube daily. (Patient not taking: Reported on 01/24/2021)     magnesium  gluconate (MAGONATE) 500 MG tablet Place 0.5 tablets (250 mg total) into feeding tube daily. (Patient not taking: Reported on 01/24/2021)     metFORMIN (GLUCOPHAGE) 500 MG tablet Place 1 tablet (500 mg total) into feeding tube daily with breakfast. (Patient not taking: Reported on 01/24/2021)     Nutritional Supplements (FEEDING SUPPLEMENT, OSMOLITE 1.5 CAL,) LIQD Place 355 mLs into feeding tube 4 (four) times daily -  with meals and at bedtime. (Patient not taking: Reported on 01/24/2021)  0   Nutritional Supplements (FEEDING  SUPPLEMENT, PROSOURCE TF,) liquid Place 45 mLs into feeding tube 3 (three) times daily. (Patient not taking: Reported on 01/24/2021)     Water For Irrigation, Sterile (FREE WATER) SOLN Place 100 mLs into feeding tube every 4 (four) hours. (Patient not taking: Reported on 01/24/2021)     No current facility-administered medications on file prior to visit.    Allergies:   Allergies  Allergen Reactions   Crestor [Rosuvastatin] Other (See Comments)    Severe pain   Lipitor [Atorvastatin] Other (See Comments)    Severe pain      OBJECTIVE:  Physical Exam  Vitals:   01/24/21 1030  BP: (!) 159/96  Pulse: 74  Height:  (1.88 m)    Body mass index is 25.41 kg/m. No results found.  General: well developed, well nourished,  very pleasant elderly Caucasian male, seated, in no evident distress Head: head normocephalic and atraumatic.   Neck: supple with no carotid or supraclavicular bruits Cardiovascular: regular rate and rhythm, no murmurs Musculoskeletal: no deformity Skin:  no rash/petichiae Vascular:  Normal pulses all extremities   Neurologic Exam Mental Status: Awake and fully alert. Mild dysarthria without evidence of aphasia. Oriented to place and time. Recent and remote memory intact during visit. Attention span, concentration and fund of knowledge appropriate during visit. Mood and affect appropriate.  Cranial Nerves: Pupils equal, briskly reactive to light.  Extraocular movements full without nystagmus. Visual fields full to confrontation. Hearing intact. Facial sensation intact.  Right lower facial weakness.  Tongue, and palate moves normally and symmetrically.  Motor: Normal bulk and tone and strength left side.  RUE: 2/5 deltoid, elbow flexion and handgrip, 3+/5 elbow extension, increased tone greater distally. RLE: 2/5 HF and ankle dorsiflexion, 2/5  Hip abductor/adductor, knee flexion and plantar flexion, 3/5 knee extension Sensory.: intact to touch , pinprick , position and vibratory sensation.  Coordination: Rapid alternating movements normal on leftside. Finger-to-nose and heel-to-shin performed accurately on left side. Gait and Station: Deferred as patient nonambulatory Reflexes: 2+ RUE and RLE and 1+ LUE and LLE. Toes downgoing.        ASSESSMENT: Burch Marchuk is a 80 y.o. year old male presented with acute onset right-sided weakness and dysarthria on 06/10/2020 with stroke work-up revealing acute inferior pontine hemorrhage likely secondary to hypertension in setting of Eliquis coagulopathy requiring reversal with Kcentra. Vascular risk factors include b/l vertebral artery stenosis, left carotid stenosis, numerous chronic microhemorrhages (HTN vs CAA), chronic A. fib, HTN, HLD, advanced age, CAD, OSA and AAA s/p stenting 2006.      PLAN:  Pontine ICH:  Residual deficit: Right spastic hemiparesis, dysarthria and dysphagia.  Currently receiving therapies at [facility]  Advised to schedule follow-up visit with Dr. Wynn Banker to further discuss spasticity and possible benefit of Botox as intolerant to both baclofen and Flexeril. Concern of some underlying depression - advised to discuss further with [facility] for possible benefit of counseling sessions for adjustment post stroke Currently participating in ASPIIRE trial through [facility]  Discussed secondary stroke prevention measures and importance of close PCP follow up for  aggressive stroke risk factor management  Atrial fibrillation: Currently participating in ASPIRE trial thru [facility] research on Eliquis OR aspirin.  Continue to follow with cardiology for routine follow-up HTN: BP goal <130/90.  Currently well controlled on current regimen per PCP    Follow up in 6 months or call earlier if needed   CC:  [facility] provider: Dr. Kreg Shropshire, Orie Rout, NP  I spent 36 minutes of face-to-face and non-face-to-face time with patient and wife.  This included previsit chart review, lab review, study review, electronic health record documentation, patient and wife discussion and education regarding prior stroke including etiology, secondary stroke prevention measures and importance of managing stroke risk factors as noted above, residual deficits including spasticity and answered all other questions to patient and wife's satisfaction  Ihor AustinJessica McCue, AGNP-BC  [facility] [address] [place] [id number]  Phone [phone] Fax [phone] Note: This document was prepared with digital dictation and possible smart phrase technology. Any transcriptional errors that result from this process are unintentional.

## 2021-01-24 NOTE — Patient Instructions (Addendum)
Please schedule follow up visit with Dr. Wynn Banker (physical medicine and rehab) as your prior visit was missed and further discuss spasticity concerns - if concern of night time spasticity (when laying down) use of heat can help to relax the muscles  Continue working with therapies at [employer] [facility] - may consider repeating swallow evaluation in the next month or two if any improvement of swallowing  Continue to participate in ASPIRE trial through our research department  Continue to follow up with PCP regarding cholesterol and blood pressure management  Maintain strict control of hypertension with blood pressure goal below 130/90 and cholesterol with LDL cholesterol (bad cholesterol) goal below 70 mg/dL.       Followup in the future with me in 6 months or call earlier if needed       Thank you for coming to see [place] at [facility]. I hope we have been able to provide you high quality care today.  You may receive a patient satisfaction survey over the next few weeks. We would appreciate your feedback and comments so that we may continue to improve ourselves and the health of our patients.    Spasticity Spasticity is a condition in which your muscles contract suddenly and unpredictably (spasm). Spasticity usually affects your arms, legs, or back. It can also affect theway you walk. Spasticity can range from mild muscle stiffness and tightness to severe, uncontrollable muscle spasms. Severe spasticity can be painful and can freezeyour muscles in an uncomfortable position. Follow these instructions at home: Managing muscle stiffness and spasms     Wear a brace as told by your health care provider to prevent muscle contractions. Have the affected muscles massaged. If directed, apply heat to the affected muscle area. Use the heat source that your health care provider recommends, such as a moist heat pack or heating pad. Place a towel between your skin and the heat  source. Leave the heat on for 20-30 minutes. Remove the head if your skin turns bright red. This is especially important if you are unable to feel pain, heat, or cold. You may have a greater risk of getting burned. If directed, apply ice to the affected muscle area: Put ice in a plastic bag. Place a towel between your skin and the bag or between your brace and the bag. Leave the ice on for 20 minutes, 2?3 times a day. Activity Stay active as directed by your health care provider. Find a safe exercise program that fits your needs and ability. Maintain good posture when walking and sitting. Work with a physical therapist to learn exercises that will stretch and strengthen your muscles. Do stretching and range of motion exercises at home as told by a physical therapist. Work with an occupational therapist. This type of health care provider can help you function better at home and at work. If you have severe spasticity, use mobility aids, such as a walker or cane, as told by your health care provider. General instructions Watch your condition for any changes. Wear loose, comfortable clothing that does not restrict your movement. Wear closed-toe shoes that fit well and support your feet. Wear shoes that have rubber soles or low heels. Keep all follow-up visits as told by your health care provider. This is important. Take over-the-counter and prescription medicine only as told by your health care provider. Contact a health care provider if you: Have worsening muscle spasms. Develop other symptoms along with spasticity. Have a fever or chills. Experience a burning feeling when  you pass urine. Become constipated. Need more support at home. Get help right away if you: Have trouble breathing. Have a muscle spasm that freezes you into a painful position. Cannot walk. Cannot care for yourself at home. Have trouble passing urine or have urinary incontinence. Summary Spasticity is a condition in  which your muscles contract suddenly and unpredictably (spasm). Spasticity usually affects your arms, legs, or back. Spasticity can range from mild muscle stiffness and tightness to severe, uncontrollable muscle spasms. Do stretching and range of motion exercises at home as told by a physical therapist. Take over-the-counter and prescription medicine only as told by your health care provider. This information is not intended to replace advice given to you by your health care provider. Make sure you discuss any questions you have with your healthcare provider. Document Revised: 07/09/2017 Document Reviewed: 07/09/2017 Elsevier Patient Education  2022 [employer].

## 2021-01-26 NOTE — Progress Notes (Signed)
I agree with the above plan 

## 2021-04-29 DIAGNOSIS — I340 Nonrheumatic mitral (valve) insufficiency: Secondary | ICD-10-CM | POA: Diagnosis not present

## 2021-05-13 IMAGING — CT CT HEAD W/O CM
4 series · 15 of 47 positions shown, 17 images · non-contrast
Comparison: CT head dated June 17, 2020

CLINICAL DATA: Syncope.

EXAM:
CT HEAD WITHOUT CONTRAST
TECHNIQUE: Contiguous axial images were obtained from the base of the skull
through the vertex without intravenous contrast.

[Series 4: head without · axial · non-contrast · 0.42mm/px · z∈[-164,-44]mm · 7 of 32 slices shown, 9 images]
[im 4/32  brain]
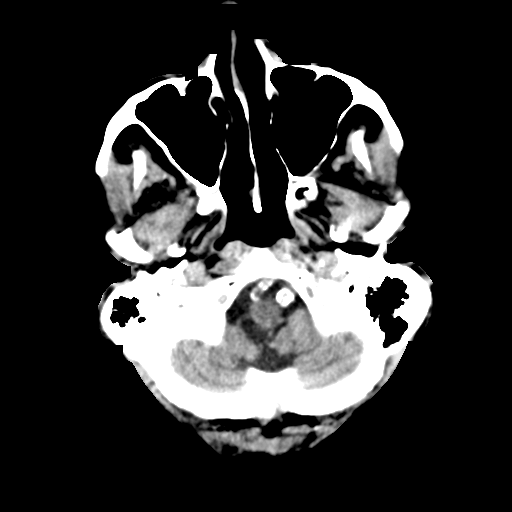
[im 4/32  bone]
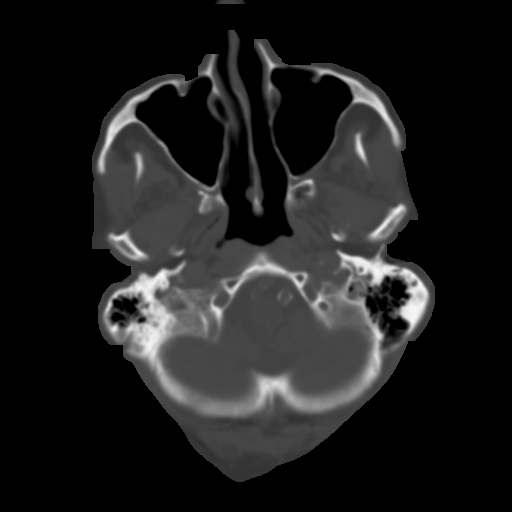
[im 8/32  brain]
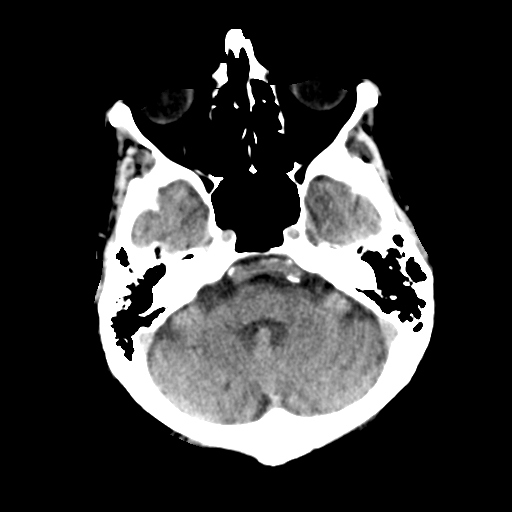
[im 12/32  brain]
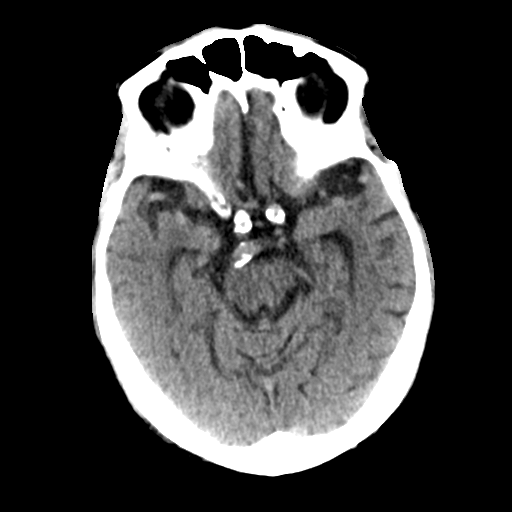
[im 16/32  brain]
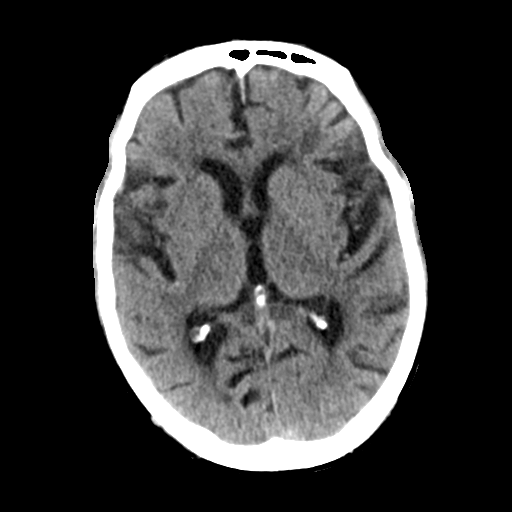
[im 20/32  brain]
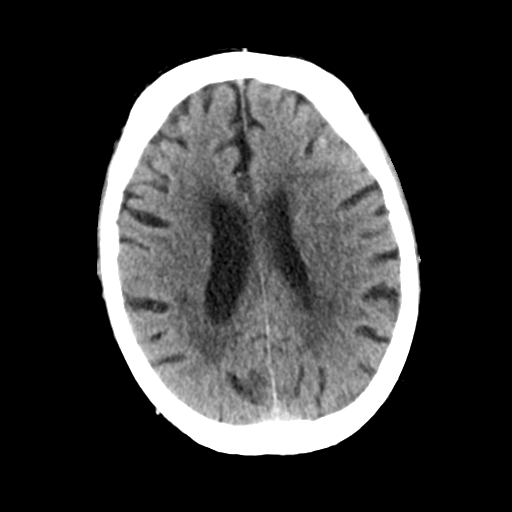
[im 20/32  bone]
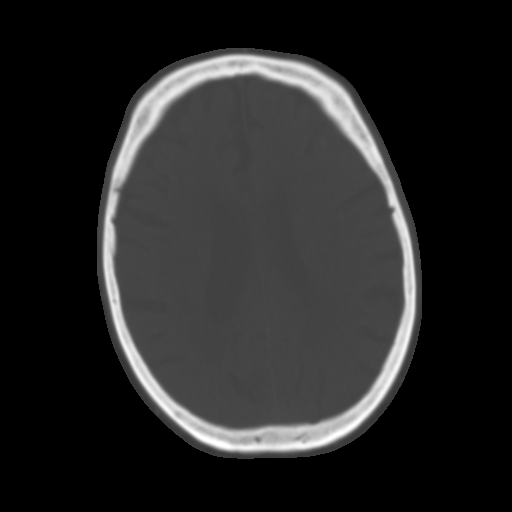
[im 24/32  brain]
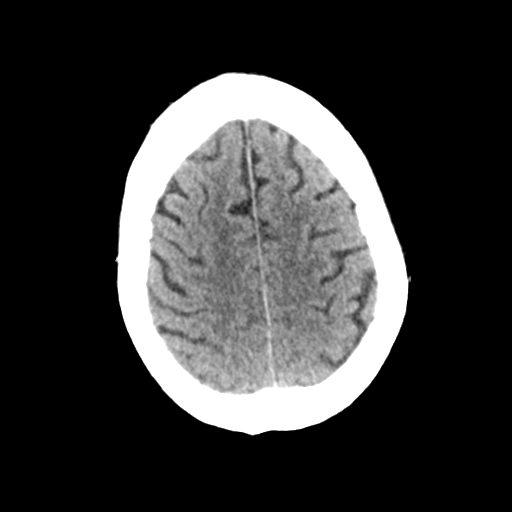
[im 28/32  brain]
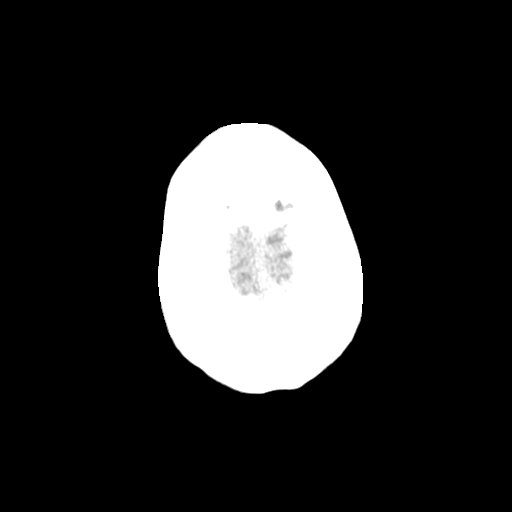

[Series 5: head bone · axial · 0.42mm/px · z∈[-166,-150]mm · 2 of 80 slices shown]
[im 8/80  bone]
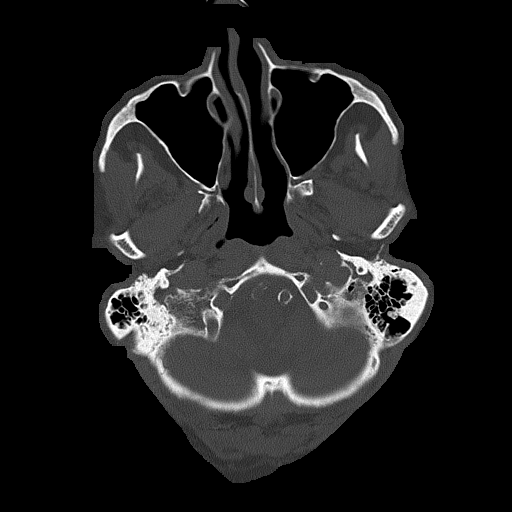
[im 16/80  bone]
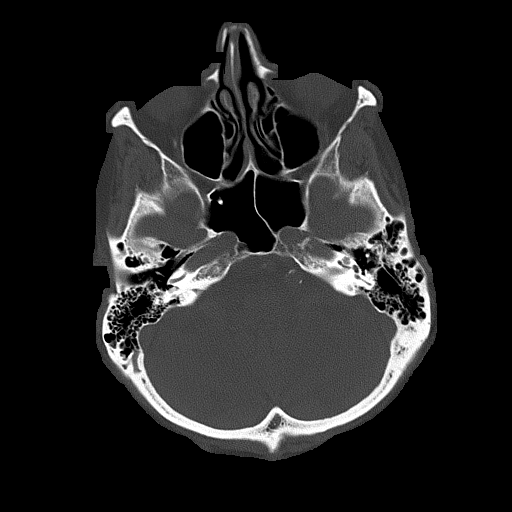

[Series 6: head without cor · coronal · non-contrast · 0.32mm/px · 3 of 68 slices shown]
[im 23/68  brain]
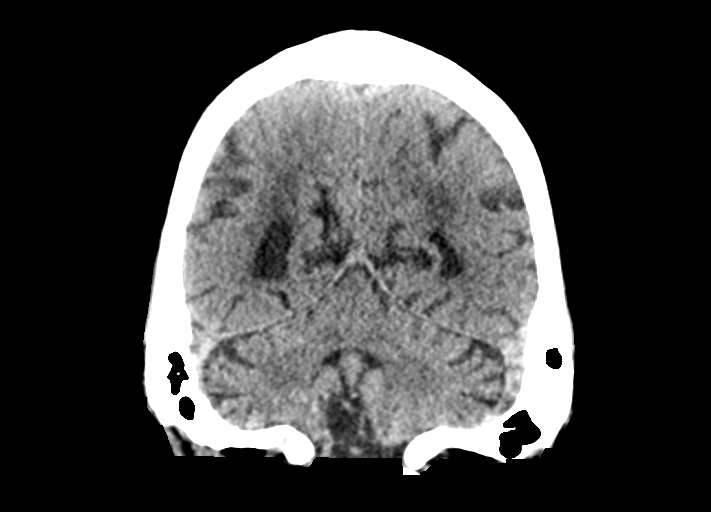
[im 30/68  brain]
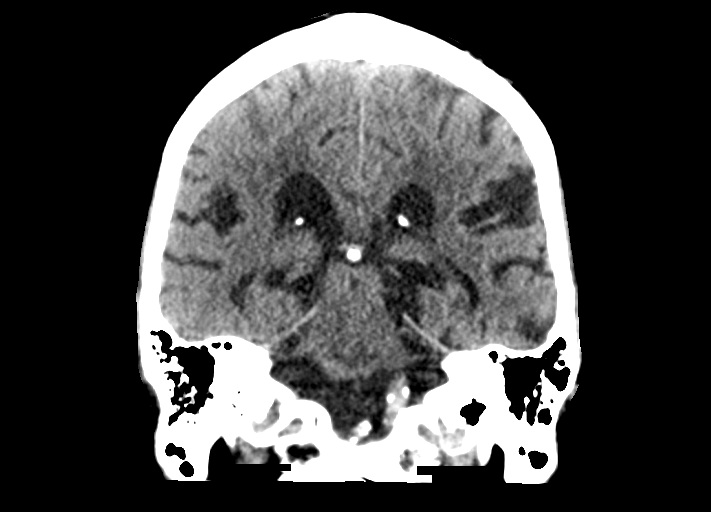
[im 38/68  brain]
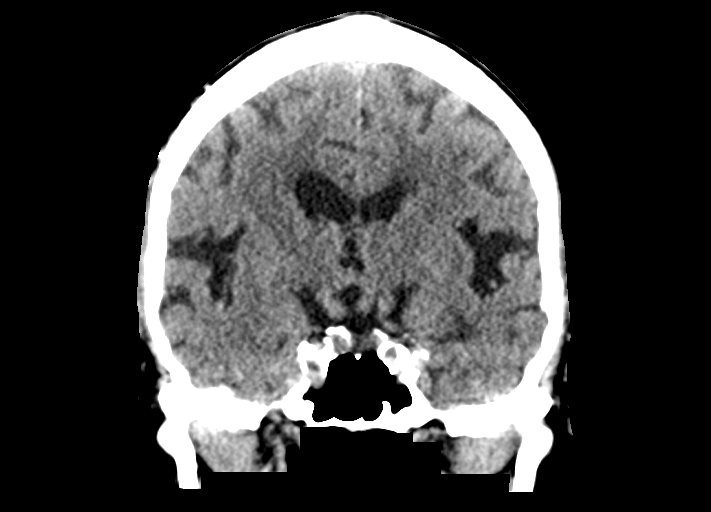

[Series 7: head without sag · sagittal · non-contrast · 0.32mm/px · 3 of 74 slices shown]
[im 25/74  brain]
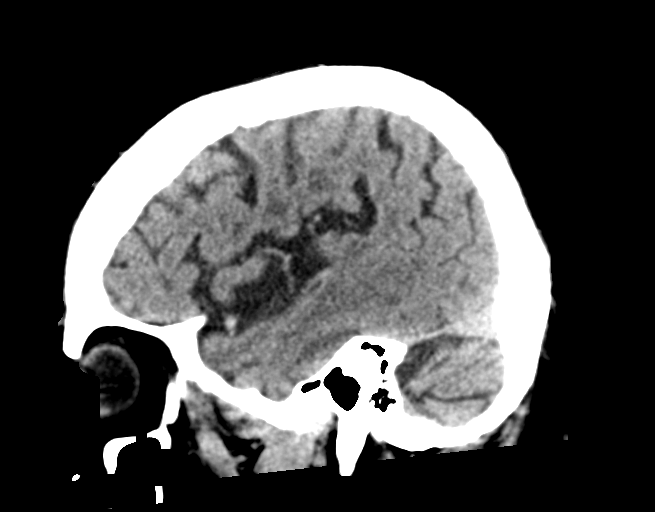
[im 37/74  brain]
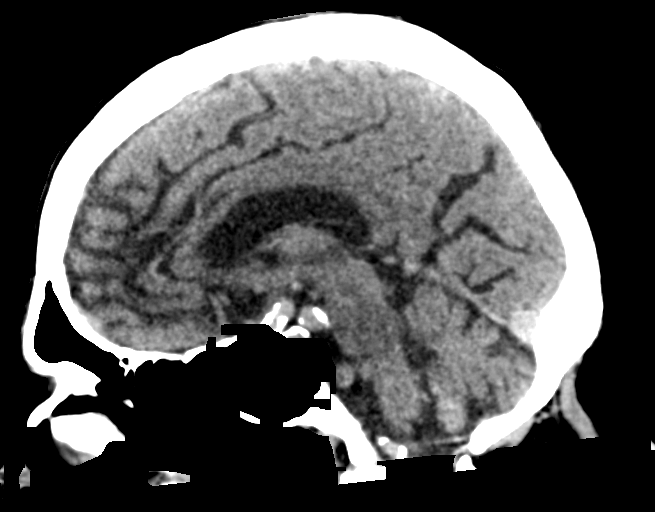
[im 49/74  brain]
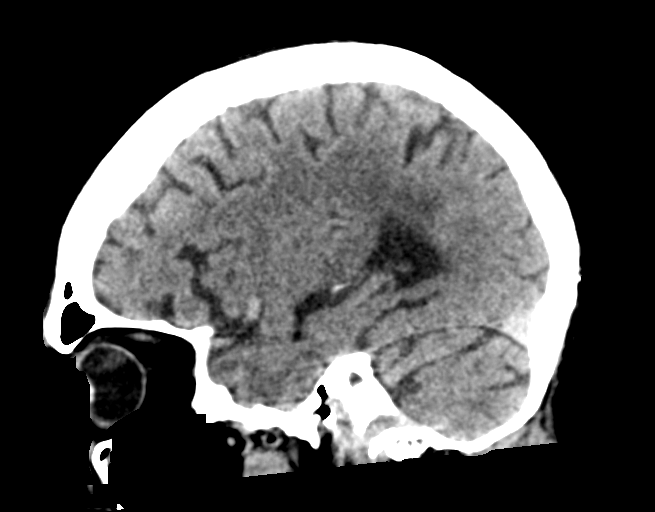

[15 of 47 positions shown; findings below may reference images not displayed]

FINDINGS: Brain: No hemorrhage. No extraaxial collection.No midline shift.
There is atrophy.The basal cisterns are unremarkable. Patchy and
confluent areas of decreased attenuation are noted throughout the
deep and periventricular white matter of the cerebral hemispheres
bilaterally, compatible with chronic microvascular ischemic disease.
The brainstem is unremarkable. The cerebellum is unremarkable. The
sella is unremarkable.

Vascular: No hyperdense vessel or unexpected calcification.

Skull: The calvarium is unremarkable. The skull base is
unremarkable. The visualized upper cervical spine is unremarkable.

Sinuses/Orbits: The visualized orbits are unremarkable. The
paranasal sinuses are unremarkable. The mastoid air cells are clear.

Other: The visualized parotid gland is unremarkable. There is no
scalp soft tissue swelling.
IMPRESSION: 1. No acute intracranial abnormality.
2. Atrophy and chronic microvascular ischemic disease.

## 2021-07-14 ENCOUNTER — Other Ambulatory Visit: Payer: Self-pay | Admitting: Urology

## 2021-07-14 ENCOUNTER — Telehealth: Payer: Self-pay | Admitting: Cardiovascular Disease

## 2021-07-14 NOTE — Telephone Encounter (Signed)
Pt has been scheduled to see Edd Fabian, NP, 08/04/2021, clearance will be addressed at that time.  Will route back to the requesting surgeon's office to make them aware.

## 2021-07-14 NOTE — Telephone Encounter (Signed)
Primary Cardiologist:Jonathan Allyson Sabal, MD  Chart reviewed as part of pre-operative protocol coverage. Because of [name] past medical history and time since last visit, he/she will require a follow-up visit in order to better assess preoperative cardiovascular risk.  Pre-op covering staff: - Please schedule appointment and call patient to inform them. - Please contact requesting surgeon's office via preferred method (i.e, phone, fax) to inform them of need for appointment prior to surgery.  If applicable, this message will also be routed to pharmacy pool and/or primary cardiologist for input on holding anticoagulant/antiplatelet agent as requested below so that this information is available at time of patient's appointment.   Ronney Asters, NP  07/14/2021, 10:39 AM

## 2021-07-14 NOTE — Telephone Encounter (Signed)
° °  Pre-operative Risk Assessment    Patient Name: [name]  DOB: Nov 06, 1940 MRN: [id number]      Request for Surgical Clearance    Procedure:  Bladder Stine Removal  Date of Surgery:  Clearance 08-09-21                                  Surgeon:  Dr Modena Slater Surgeon's Group or Practice Name:   Phone number:  [phone] Fax number:  [phone]   Type of Clearance Requested:   - Medical    Type of Anesthesia:   Choice   Additional requests/questions:    Signed, Laurence Ferrari   07/14/2021, 9:55 AM

## 2021-07-26 NOTE — Patient Instructions (Addendum)
DUE TO COVID-19 ONLY ONE VISITOR IS ALLOWED TO COME WITH YOU AND STAY IN THE WAITING ROOM ONLY DURING PRE OP AND PROCEDURE DAY OF SURGERY IF YOU ARE GOING HOME AFTER SURGERY. IF YOU ARE SPENDING THE NIGHT 2 PEOPLE MAY VISIT WITH YOU IN YOUR PRIVATE ROOM AFTER SURGERY UNTIL VISITING  HOURS ARE OVER AT 800 PM AND 1  VISITOR  MAY  SPEND THE NIGHT.               [name]     Your procedure is scheduled on: 08/09/21   Report to [facility] Main  Entrance   Report to admitting at   8:00 AM     Call this number if you have problems the morning of surgery [phone]    Remember: Do not eat food or drink :After Midnight the night before your surgery,       BRUSH YOUR TEETH MORNING OF SURGERY AND RINSE YOUR MOUTH OUT, NO CHEWING GUM CANDY OR MINTS.     Take these medicines the morning of surgery with A SIP OF WATER: Metoprolol, Amlodipine, Finasteride, Famotidine                                You may not have any metal on your body including              piercings  Do not wear jewelry, lotions, powders or  deodorant             Men may shave face and neck.   Do not bring valuables to the hospital. [facility] IS NOT             RESPONSIBLE   FOR VALUABLES.  Contacts, dentures or bridgework may not be worn into surgery.     Patients discharged the day of surgery will not be allowed to drive home.  IF YOU ARE HAVING SURGERY AND GOING HOME THE SAME DAY, YOU MUST HAVE AN ADULT TO DRIVE YOU HOME AND BE WITH YOU FOR 24 HOURS. YOU MAY GO HOME BY TAXI OR UBER OR ORTHERWISE, BUT AN ADULT MUST ACCOMPANY YOU HOME AND STAY WITH YOU FOR 24 HOURS.  Name and phone number of your driver:  Special Instructions: N/A              Please read over the following fact sheets you were given: _____________________________________________________________________             [facility] - Preparing for Surgery Before surgery, you can play an important role.  Because skin is not sterile, your  skin needs to be as free of germs as possible.  You can reduce the number of germs on your skin by washing with CHG (chlorahexidine gluconate) soap before surgery.  CHG is an antiseptic cleaner which kills germs and bonds with the skin to continue killing germs even after washing. Please DO NOT use if you have an allergy to CHG or antibacterial soaps.  If your skin becomes reddened/irritated stop using the CHG and inform your nurse when you arrive at Short Stay.  You may shave your face/neck. Please follow these instructions carefully:  1.  Shower with CHG Soap the night before surgery and the  morning of Surgery.  2.  If you choose to wash your hair, wash your hair first as usual with your  normal  shampoo.  3.  After you shampoo, rinse your hair  and body thoroughly to remove the  shampoo.                            4.  Use CHG as you would any other liquid soap.  You can apply chg directly  to the skin and wash                       Gently with a scrungie or clean washcloth.  5.  Apply the CHG Soap to your body ONLY FROM THE NECK DOWN.   Do not use on face/ open                           Wound or open sores. Avoid contact with eyes, ears mouth and genitals (private parts).                       Wash face,  Genitals (private parts) with your normal soap.             6.  Wash thoroughly, paying special attention to the area where your surgery  will be performed.  7.  Thoroughly rinse your body with warm water from the neck down.  8.  DO NOT shower/wash with your normal soap after using and rinsing off  the CHG Soap.                9.  Pat yourself dry with a clean towel.            10.  Wear clean pajamas.            11.  Place clean sheets on your bed the night of your first shower and do not  sleep with pets. Day of Surgery : Do not apply any lotions/deodorants the morning of surgery.  Please wear clean clothes to the hospital/surgery center.  FAILURE TO FOLLOW THESE INSTRUCTIONS MAY RESULT IN  THE CANCELLATION OF YOUR SURGERY PATIENT SIGNATURE_________________________________  NURSE SIGNATURE__________________________________  ________________________________________________________________________

## 2021-07-28 ENCOUNTER — Encounter (HOSPITAL_COMMUNITY)
Admission: RE | Admit: 2021-07-28 | Discharge: 2021-07-28 | Disposition: A | Discharge status: Home / Self Care | Payer: No Typology Code available for payment source | Source: Ambulatory Visit | Attending: Urology | Admitting: Urology

## 2021-07-28 ENCOUNTER — Encounter (HOSPITAL_COMMUNITY): Payer: Self-pay

## 2021-07-28 ENCOUNTER — Other Ambulatory Visit: Payer: Self-pay

## 2021-07-28 VITALS — BP 136/78 | HR 53 | Temp 98.1°F | Resp 16 | Ht 74.0 in | Wt 167.0 lb

## 2021-07-28 DIAGNOSIS — I10 Essential (primary) hypertension: Secondary | ICD-10-CM | POA: Insufficient documentation

## 2021-07-28 DIAGNOSIS — Z01818 Encounter for other preprocedural examination: Secondary | ICD-10-CM | POA: Insufficient documentation

## 2021-07-28 LAB — BASIC METABOLIC PANEL
Anion gap: 6 (ref 5–15)
BUN: 25 mg/dL — ABNORMAL HIGH (ref 8–23)
CO2: 27 mmol/L (ref 22–32)
Calcium: 8.8 mg/dL — ABNORMAL LOW (ref 8.9–10.3)
Chloride: 105 mmol/L (ref 98–111)
Creatinine, Ser: 0.93 mg/dL (ref 0.61–1.24)
GFR, Estimated: >60 mL/min (ref >60)
Glucose, Bld: 101 mg/dL — ABNORMAL HIGH (ref 70–99)
Potassium: 3.5 mmol/L (ref 3.5–5.1)
Sodium: 138 mmol/L (ref 135–145)

## 2021-07-28 LAB — CBC
HCT: 38.2 % — ABNORMAL LOW (ref 39.0–52.0)
Hemoglobin: 12.3 g/dL — ABNORMAL LOW (ref 13.0–17.0)
MCH: 30 pg (ref 26.0–34.0)
MCHC: 32.2 g/dL (ref 30.0–36.0)
MCV: 93.2 fL (ref 80.0–100.0)
Platelets: 161 10*3/uL (ref 150–400)
RBC: 4.1 MIL/uL — ABNORMAL LOW (ref 4.22–5.81)
RDW: 14.4 % (ref 11.5–15.5)
WBC: 5.5 10*3/uL (ref 4.0–10.5)
nRBC: 0 % (ref 0.0–0.2)

## 2021-07-28 LAB — SURGICAL PCR SCREEN
MRSA, PCR: NEGATIVE
Staphylococcus aureus: NEGATIVE

## 2021-07-28 NOTE — Progress Notes (Signed)
COVID test- NA  Bowel prep reminder:NA  PCP - Mauricio Po NP Cardiologist - Dr. Erlene Quan  Chest x-ray - 07/24/20-epic EKG - 07/28/21-chart Stress Test - 2017 ECHO - 05/01/21-epic Cardiac Cath - 2005,2017 Pacemaker/ICD device last checked:NA  Sleep Study - yes-negative findings CPAP - no  Fasting Blood Sugar - NA Checks Blood Sugar _____ times a day  Blood Thinner Instructions:Plavix/ Dr. Allyson Sabal Aspirin Instructions:none yet. Pt will see Dr. Allyson Sabal 08/04/21 Last Dose:  Anesthesia review: yes  Patient denies shortness of breath, fever, cough and chest pain at PAT appointment Pt is in a wheelchair. He has a feeding tube and foley cath.   Patient verbalized understanding of instructions that were given to them at the PAT appointment. Patient was also instructed that they will need to review over the PAT instructions again at home before surgery. Yes. His daughter was with him for PAT visit

## 2021-08-03 NOTE — Progress Notes (Addendum)
Cardiology Office Note:    Date:  08/04/2021   ID:  [name], DOB [birth year], MRN [id number]  PCP:  Imagene Riches, NP   [facility] Providers Cardiologist:  Quay Burow, MD     Referring MD: Imagene Riches, NP   Chief Complaint: preoperative cardiac evaluation/follow-up CAD  History of Present Illness:    [name] is a 81 y.o. male with a hx of AAA s/p stenting, chronic atrial fibrillation on anticoagulation, HTN, aortic atherosclerosis, thoracic aortic aneurysm s/p stent 2006, OSA, thrombocytopenia, and stroke.  He had stenting of LAD and diagonal branch in December 2005. LV function was normal at that time.  Had re-stenting at [facility] soon afterwards. Had distal aorta stent graft at [facility] in 2006 for abdominal aortic aneurysm. He had repeat cardiac cath on 4/17 after a myoview stress test done for preop clearance was read as high risk with anteroseptal and apical ischemia.  Cardiac cath showed widely patent LAD diagonal branch stent with a 60% stenosis in the mid dominant RCA but it did not appear to be flow-limiting.   Most recent measurement of thoracic aortic aneurysm was 42 mm by echo 12/21. Vas u/s of AAA revealed no change in diameter from previous study 7/20.   He was last seen by Dr. Gwenlyn Found in our office 05/27/20 which was prior to his stroke. Echo revealed LVEF 60-65%, no rwma, moderate LVH, unable to evaluate LV diastolic function, mild to moderately dilated LA, trivial MR, mild AS.   In December 2021, he presented to [facility] ED as Code Stroke after he fell when attempting to get out of bed and had difficulty speaking. He had right arm and leg weakness, facial droop, and dysarthria. 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 Eliquis was stopped during hospitalization.   Today, he is here for  preoperative cardiac evaluation with his daughter. He lives at home with his family and attends [facility] program 4 days per week.  He is in the ASPIRE study where he either receives placebo or Eliquis or placebo or aspirin through neurology. He denies chest pain, shortness of breath, fatigue, palpitations, melena, hematuria, hemoptysis, diaphoresis, weakness, presyncope, syncope, orthopnea, and PND. Mild lower extremity swelling. No weight gain. He is not very active but reports no change in activity tolerance in the past year.   Past Medical History:  Diagnosis Date   Abdominal aortic aneurysm 2006   status post endoluminal stent graft at [facility] in 2006   Arthritis    Coronary artery disease    GERD (gastroesophageal reflux disease)    History of shingles    TIMES 2   Hyperlipidemia    Hypertension    MRSA (methicillin resistant Staphylococcus aureus) infection    PONV (postoperative nausea and vomiting)    Sleep apnea    pt scored 5 per Stop Bang tool at PAT visit 10/26/2015; results sent to Heide Scales NP   Stroke [facility]) 05/2020   Rt side weak   Vertigo     Past Surgical History:  Procedure Laterality Date   ABDOMINAL AORTIC ANEURYSM REPAIR W/ ENDOLUMINAL GRAFT     13 years ago    ANGIOPLASTY     branch stenting  06/08/2004   Dr. Janene Madeira   CARDIAC CATHETERIZATION  06/08/2004   CARDIOVASCULAR STRESS TEST  02/14/2009   I&D of left knee  IR GASTROSTOMY TUBE MOD SED  06/29/2020   LUMBAR LAMINECTOMY/DECOMPRESSION MICRODISCECTOMY N/A 11/02/2015   Procedure: MICROLUMBAR DECOMPRESSION L3-L4, L4-L5, AND L5-S1;  Surgeon: Susa Day, MD;  Location: [facility] ORS;  Service: Orthopedics;  Laterality: N/A;   PICC LINE PLACE PERIPHERAL ([facility] HX)     TRANSTHORACIC ECHOCARDIOGRAM  06/07/2004    Current Medications: Current Meds  Medication Sig   acetaminophen (TYLENOL) 160 MG/5ML solution Place 20.3 mLs (650 mg total) into feeding tube every 4 (four) hours as needed for mild  pain (or temp > 37.5 C (99.5 F)).   amLODipine (NORVASC) 5 MG tablet Take 5 mg by mouth daily.   clopidogrel (PLAVIX) 75 MG tablet Take 75 mg by mouth daily.   docusate sodium (COLACE) 100 MG capsule Take 100 mg by mouth 2 (two) times daily.   famotidine (PEPCID) 40 MG tablet Take 40 mg by mouth daily.   finasteride (PROSCAR) 5 MG tablet Take 5 mg by mouth daily.   gabapentin (NEURONTIN) 100 MG capsule Take 100 mg by mouth 2 (two) times daily.   MAGNESIUM OXIDE PO Take 500 mg by mouth at bedtime.   metoprolol tartrate (LOPRESSOR) 50 MG tablet Place 1 tablet (50 mg total) into feeding tube 3 (three) times daily. (Patient taking differently: Take 50 mg by mouth 2 (two) times daily.)   mupirocin cream (BACTROBAN) 2 % Apply 1 application topically daily as needed (Rash).   nystatin (MYCOSTATIN/NYSTOP) powder Apply 1 application topically 2 (two) times daily.   STUDY - ASPIRE - apixaban 5 mg or placebo tablet (PI-Sethi) Place 1 tablet (5 mg total) into feeding tube 2 (two) times daily.   STUDY - ASPIRE - aspirin 81 mg or placebo tablet (PI-Sethi) Place 1 tablet (81 mg total) into feeding tube daily.     Allergies:   Crestor [rosuvastatin] and Lipitor [atorvastatin]   Social History   Socioeconomic History   Marital status: Married    Spouse name: Not on file   Number of children: Not on file   Years of education: Not on file   Highest education level: Not on file  Occupational History   Not on file  Tobacco Use   Smoking status: Never   Smokeless tobacco: Never  Vaping Use   Vaping Use: Never used  Substance and Sexual Activity   Alcohol use: No   Drug use: No   Sexual activity: Not on file  Other Topics Concern   Not on file  Social History Narrative   01/24/21 lives with wife   Social Determinants of Health   Financial Resource Strain: Not on file  Food Insecurity: Not on file  Transportation Needs: Not on file  Physical Activity: Not on file  Stress: Not on file  Social  Connections: Not on file     Family History: The patient's family history includes Alzheimer's disease in his sister; Cancer in his sister; Heart failure in his maternal grandfather, maternal grandmother, paternal grandfather, and paternal grandmother.  ROS:   Please see the history of present illness.  All other systems reviewed and are negative.  Labs/Other Studies Reviewed:    The following studies were reviewed today:  Echo 12/21  Left Ventricle: Left ventricular ejection fraction, by estimation, is 60  to 65%. The left ventricle has normal function. The left ventricle has no  regional wall motion abnormalities. The left ventricular internal cavity  size was normal in size. There is  moderate left ventricular hypertrophy. Left ventricular diastolic function could not be evaluated  due to atrial fibrillation. Left  ventricular diastolic function could not be evaluated.  Right Ventricle: The right ventricular size is normal. Right vetricular  wall thickness was not well visualized. Right ventricular systolic  function is normal. Tricuspid regurgitation signal is inadequate for  assessing PA pressure.  Left Atrium: Left atrial size was mild to moderately dilated.  Right Atrium: Right atrial size was normal in size.  Pericardium: There is no evidence of pericardial effusion. Presence of  pericardial fat pad.  Mitral Valve: The mitral valve is normal in structure. Trivial mitral  valve regurgitation.  Tricuspid Valve: The tricuspid valve is normal in structure. Tricuspid  valve regurgitation is trivial. No evidence of tricuspid stenosis.  Aortic Valve: The aortic valve is tricuspid. There is mild calcification  of the aortic valve. There is mild thickening of the aortic valve. Aortic  valve regurgitation is mild. Aortic regurgitation PHT measures 604 msec.  Mild aortic stenosis is present.  Aortic valve mean gradient measures 7.7 mmHg. Aortic valve peak gradient  measures 13.9  mmHg. Aortic valve area, by VTI measures 1.95 cm.  Pulmonic Valve: The pulmonic valve was not well visualized. Pulmonic valve  regurgitation is not visualized.  Aorta: Aortic dilatation noted. There is mild dilatation of the ascending  aorta, measuring 42 mm.  Venous: The inferior vena cava was not well visualized.  IAS/Shunts: The atrial septum is grossly normal.    Vas [place] AAA 7/21  Abdominal Aorta: There is evidence of abnormal dilatation of the mid and  proximal Abdominal aorta. There is evidence of abnormal dilation of the  Right Common Iliac artery and Left Common Iliac artery. Patent  endovascular aneurysm repair with no evidence  of endoleak. The largest aortic diameter remains essentially unchanged  compared to prior exam. Previous diameter measurement was 4.2 cm obtained  on 01/01/2019.  IVC/Iliac: There is no evidence of thrombus involving the IVC.   CT Abdomen 5/21  IMPRESSION: 1. Left lower lobe airspace density concerning for pneumonia. Clinical correlation and follow-up to resolution recommended. 2. A 4 mm recently passed left renal calculus versus less likely a left UVJ stone. No hydronephrosis. A 7 mm nonobstructing right renal upper pole stone. 3. No bowel obstruction. Normal appendix. 4. Aortic Atherosclerosis (ICD10-I70.0). 5. Aorto bi iliac endovascular stent graft appears patent.   Recent Labs: 07/28/2021: BUN 25; Creatinine, Ser 0.93; Hemoglobin 12.3; Platelets 161; Potassium 3.5; Sodium 138  Recent Lipid Panel    Component Value Date/Time   CHOL 129 05/27/2020 0943   TRIG 99 06/13/2020 0603   HDL 32 (L) 05/27/2020 0943   CHOLHDL 4.0 05/27/2020 0943   CHOLHDL 5.8 11/20/2007 0425   VLDL 10 11/20/2007 0425   LDLCALC 84 05/27/2020 0943     Risk Assessment/Calculations:    CHA2DS2-VASc Score =    This indicates a  % annual risk of stroke. The patient's score is based upon: CHF History: 0 HTN History: 1 Diabetes History: 0 Stroke History:  2 Vascular Disease History: 1 Age Score: 2 Gender Score: 0    Physical Exam:    VS:  BP 120/80 (BP Location: Left Arm, Patient Position: Sitting)    Pulse 64    Ht 6\' 2"  (1.88 m)    Wt 168 lb (76.2 kg)    SpO2 97%    BMI 21.57 kg/m     Wt Readings from Last 3 Encounters:  08/04/21 168 lb (76.2 kg)  07/28/21 167 lb (75.8 kg)  09/08/20 197 lb 14.4 oz (89.8 kg)  GEN:  Well nourished, well developed chronically ill appearing gentleman in no acute distress HEENT: Normal NECK: No JVD; No carotid bruits CARDIAC: Irregular RR, no murmurs, rubs, gallops RESPIRATORY:  Clear to auscultation without rales, wheezing or rhonchi  ABDOMEN: Soft, non-tender, non-distended MUSCULOSKELETAL:  Non-pitting, mild edema bilateral lower extremities. No deformity. 2+ pedal pulses, equal bilaterally SKIN: Warm and dry NEUROLOGIC:  Alert and oriented x 3 PSYCHIATRIC:  Normal affect   EKG:  EKG is ordered today.  The ekg ordered today demonstrates atrial fibrillation @ 63 bpm, no acute change from previous  Diagnoses:    1. Chronic atrial fibrillation (HCC)   2. Abdominal aortic aneurysm (AAA) without rupture, unspecified part   3. Coronary artery disease involving native coronary artery of native heart without angina pectoris   4. Thoracic aortic aneurysm without rupture, unspecified part   5. Hyperlipidemia LDL goal <70   6. Medication management   7. Preop cardiovascular exam   8. History of stroke    Assessment and Plan:     Preoperative cardiac evaluation: We have been asked to give medical clearance for an upcoming cystoscopy.  He has had no recent ischemic evaluation and is quite sedentary due to stroke 12/21.  We will get [facility] for risk stratification. On previous lexiscan 3/17, there was a moderate size and intensity fixed apical, anterior apical defect suggestive of scar, intermediate risk study.  We will take this into consideration when reviewing the results of Lexiscan.    Chronic atrial fibrillation: Stable by EKG today.  On ASPIRE trial through neurology either receiving placebo or Eliquis.  Continue metoprolol.  CAD without ischemia: He denies chest pain, dypsnea, or other symptom concerning for angina. As noted above no recent ischemic evaluation. In the setting of upcoming surgery and request for cardiac clearance, we will get a lexiscan myoview for risk stratification.  Continue metoprolol.   Aortic aneurysm: Vascular U/S AAA 7/21 revealed stable AAA unchanged from previous  7/20 with recommendation to recheck in 1 year. Echo 12/21 revealed mild dilatation of ascending aorta at 42 mm. Will seek advice from Dr. Gwenlyn Found for imaging of thoracic as well as abdominal aorta with CT versus ultrasound and echo.   Hyperlipidemia LDL goal < 70: LDL 84 12/21.  He states he is intolerant to statins.  We will recheck today and likely refer for PCSK9 inhibitor therapy if LDL remains elevated.   History of stroke: Persistent right-sided weakness.  He is in a wheelchair today and is not able to ambulate without assistance.  He is very sedentary.  Would favor aggressive lipid management to achieve LDL less than 70 due to history of stroke and CAD    Addendum: Stress test 08/08/21 ordered for evaluation of ischemia prior to surgery   The study is normal. The study is low risk.   No ST deviation was noted.   LV perfusion is normal. There is no evidence of ischemia. There is evidence of infarction.   Not gated No LV function due to afib . End diastolic cavity size is normal. End systolic cavity size is normal.   Prior study available for comparison from 09/22/2015.   Normal resting and stress perfusion. No ischemia or infarction EF Diaphragmatic attenuation Not gated with no EF estimate due to atrial fibrillation    Disposition: 6 months with Dr. Gwenlyn Found  Shared Decision Making/Informed Consent The risks [chest pain, shortness of breath, cardiac arrhythmias, dizziness,  blood pressure fluctuations, myocardial infarction, stroke/transient ischemic attack, nausea, vomiting,  allergic reaction, radiation exposure, metallic taste sensation and life-threatening complications (estimated to be 1 in 10,000)], benefits (risk stratification, diagnosing coronary artery disease, treatment guidance) and alternatives of a nuclear stress test were discussed in detail with Mr. Suit and he agrees to proceed.    Medication Adjustments/Labs and Tests Ordered: Current medicines are reviewed at length with the patient today.  Concerns regarding medicines are outlined above.  Orders Placed This Encounter  Procedures   Direct LDL   Cardiac Stress Test: Informed Consent Details: Physician/Practitioner Attestation; Transcribe to consent form and obtain patient signature   MYOCARDIAL PERFUSION IMAGING   EKG 12-Lead   No orders of the defined types were placed in this encounter.   Patient Instructions  Medication Instructions:  The current medical regimen is effective;  continue present plan and medications as directed. Please refer to the Current Medication list given to you today.   *If you need a refill on your cardiac medications before your next appointment, please call your pharmacy*  Lab Work: DIRECT LDL TODAY If you have labs (blood work) drawn today and your tests are completely normal, you will receive your results only by:  [place] (if you have MyChart) OR A paper copy in the mail.  If you have any lab test that is abnormal or we need to change your treatment, we will call you to review the results. You may go to any [facility] that is convenient for you however, we do have a lab in our office that is able to assist you. You DO NOT need an appointment for our lab. The lab is open 8:00am and closes at 4:00pm. Lunch 12:45 - 1:45pm.  Testing/Procedures: Your physician has requested that you have a lexiscan myoview.  Please follow instruction sheet, as  given.  Follow-Up: Your next appointment:  6 month(s) In Person with Quay Burow, MD    Please call our office 2 months in advance to schedule this appointment 1  At [facility], you and your health needs are our priority.  As part of our continuing mission to provide you with exceptional heart care, we have created designated Provider Care Teams.  These Care Teams include your primary Cardiologist (physician) and Advanced Practice Providers (APPs -  Physician Assistants and Nurse Practitioners) who all work together to provide you with the care you need, when you need it.  We recommend signing up for the patient portal called "MyChart".  Sign up information is provided on this After Visit Summary.  MyChart is used to connect with patients for Virtual Visits (Telemedicine).  Patients are able to view lab/test results, encounter notes, upcoming appointments, etc.  Non-urgent messages can be sent to your provider as well.   To learn more about what you can do with MyChart, go to [email or web address].         Signed, Emmaline Life, NP  08/04/2021 2:46 PM    [facility]

## 2021-08-04 ENCOUNTER — Telehealth (HOSPITAL_COMMUNITY): Payer: Self-pay | Admitting: *Deleted

## 2021-08-04 ENCOUNTER — Encounter: Payer: Self-pay | Admitting: General Practice

## 2021-08-04 ENCOUNTER — Ambulatory Visit (INDEPENDENT_AMBULATORY_CARE_PROVIDER_SITE_OTHER): Payer: No Typology Code available for payment source | Admitting: Nurse Practitioner

## 2021-08-04 ENCOUNTER — Other Ambulatory Visit: Payer: Self-pay

## 2021-08-04 VITALS — BP 120/80 | HR 64 | Ht 74.0 in | Wt 168.0 lb

## 2021-08-04 DIAGNOSIS — I714 Abdominal aortic aneurysm, without rupture, unspecified: Secondary | ICD-10-CM

## 2021-08-04 DIAGNOSIS — E785 Hyperlipidemia, unspecified: Secondary | ICD-10-CM

## 2021-08-04 DIAGNOSIS — Z01810 Encounter for preprocedural cardiovascular examination: Secondary | ICD-10-CM

## 2021-08-04 DIAGNOSIS — I712 Thoracic aortic aneurysm, without rupture, unspecified: Secondary | ICD-10-CM | POA: Diagnosis not present

## 2021-08-04 DIAGNOSIS — I2510 Atherosclerotic heart disease of native coronary artery without angina pectoris: Secondary | ICD-10-CM | POA: Diagnosis not present

## 2021-08-04 DIAGNOSIS — I482 Chronic atrial fibrillation, unspecified: Secondary | ICD-10-CM

## 2021-08-04 DIAGNOSIS — Z79899 Other long term (current) drug therapy: Secondary | ICD-10-CM

## 2021-08-04 DIAGNOSIS — Z8673 Personal history of transient ischemic attack (TIA), and cerebral infarction without residual deficits: Secondary | ICD-10-CM

## 2021-08-04 NOTE — Patient Instructions (Signed)
Medication Instructions:  The current medical regimen is effective;  continue present plan and medications as directed. Please refer to the Current Medication list given to you today.   *If you need a refill on your cardiac medications before your next appointment, please call your pharmacy*  Lab Work: DIRECT LDL TODAY If you have labs (blood work) drawn today and your tests are completely normal, you will receive your results only by:  [place] (if you have MyChart) OR A paper copy in the mail.  If you have any lab test that is abnormal or we need to change your treatment, we will call you to review the results. You may go to any [facility] that is convenient for you however, we do have a lab in our office that is able to assist you. You DO NOT need an appointment for our lab. The lab is open 8:00am and closes at 4:00pm. Lunch 12:45 - 1:45pm.  Testing/Procedures: Your physician has requested that you have a lexiscan myoview.  Please follow instruction sheet, as given.  Follow-Up: Your next appointment:  6 month(s) In Person with Quay Burow, MD    Please call our office 2 months in advance to schedule this appointment 1  At [facility], you and your health needs are our priority.  As part of our continuing mission to provide you with exceptional heart care, we have created designated Provider Care Teams.  These Care Teams include your primary Cardiologist (physician) and Advanced Practice Providers (APPs -  Physician Assistants and Nurse Practitioners) who all work together to provide you with the care you need, when you need it.  We recommend signing up for the patient portal called "MyChart".  Sign up information is provided on this After Visit Summary.  MyChart is used to connect with patients for Virtual Visits (Telemedicine).  Patients are able to view lab/test results, encounter notes, upcoming appointments, etc.  Non-urgent messages can be sent to your provider as well.   To  learn more about what you can do with MyChart, go to [email or web address].

## 2021-08-04 NOTE — Telephone Encounter (Signed)
Close encounter 

## 2021-08-05 LAB — LDL CHOLESTEROL, DIRECT: LDL Direct: 52 mg/dL (ref 0–99)

## 2021-08-06 ENCOUNTER — Other Ambulatory Visit (HOSPITAL_BASED_OUTPATIENT_CLINIC_OR_DEPARTMENT_OTHER): Payer: Self-pay | Admitting: Nurse Practitioner

## 2021-08-06 DIAGNOSIS — I712 Thoracic aortic aneurysm, without rupture, unspecified: Secondary | ICD-10-CM

## 2021-08-06 DIAGNOSIS — I714 Abdominal aortic aneurysm, without rupture, unspecified: Secondary | ICD-10-CM

## 2021-08-06 NOTE — Progress Notes (Signed)
A 

## 2021-08-08 ENCOUNTER — Ambulatory Visit (HOSPITAL_COMMUNITY)
Admission: RE | Admit: 2021-08-08 | Discharge: 2021-08-08 | Disposition: A | Discharge status: Home / Self Care | Payer: No Typology Code available for payment source | Source: Ambulatory Visit | Attending: Cardiology | Admitting: Cardiology

## 2021-08-08 ENCOUNTER — Other Ambulatory Visit: Payer: Self-pay

## 2021-08-08 ENCOUNTER — Telehealth: Payer: Self-pay | Admitting: *Deleted

## 2021-08-08 DIAGNOSIS — I2510 Atherosclerotic heart disease of native coronary artery without angina pectoris: Secondary | ICD-10-CM | POA: Diagnosis present

## 2021-08-08 LAB — MYOCARDIAL PERFUSION IMAGING
Peak HR: 83 {beats}/min
Rest HR: 68 {beats}/min
Rest Nuclear Isotope Dose: 10.5 mCi
SDS: 1
SRS: 1
SSS: 2
ST Depression (mm): 0 mm
Stress Nuclear Isotope Dose: 31.6 mCi
TID: 0.95

## 2021-08-08 MED ORDER — REGADENOSON 0.4 MG/5ML IV SOLN
0.4000 mg | Freq: Once | INTRAVENOUS | Status: AC
  Administered 2021-08-08: 0.4 mg via INTRAVENOUS

## 2021-08-08 MED ORDER — TECHNETIUM TC 99M TETROFOSMIN IV KIT
31.6000 | PACK | Freq: Once | INTRAVENOUS | Status: AC | PRN
  Administered 2021-08-08: 31.6 via INTRAVENOUS
  Filled 2021-08-08: qty 32

## 2021-08-08 MED ORDER — TECHNETIUM TC 99M TETROFOSMIN IV KIT
10.5000 | PACK | Freq: Once | INTRAVENOUS | Status: AC | PRN
  Administered 2021-08-08: 10.5 via INTRAVENOUS
  Filled 2021-08-08: qty 11

## 2021-08-08 NOTE — Progress Notes (Signed)
See phone where I went over the stress test results with the pt's wife DPR

## 2021-08-08 NOTE — Progress Notes (Signed)
Anesthesia Chart Review   Case: [id number] Date/Time: 08/09/21 0945   Procedure: CYSTOSCOPY WITH LITHOLAPAXY - 45 MINS   Anesthesia type: Choice   Pre-op diagnosis: BLADDER STONE   Location: [facility] / [facility]   Surgeons: Crista Elliot, MD       DISCUSSION:81 y.o. never smoker with h/o PONV, GERD, sleep apnea, HTN, AAA s/p stent graft 2006, CAD, mild aortic valve stenosis, stroke, bladder stone scheduled for above procedure 08/09/2021 with Dr. Modena Slater.   Pt seen by cardiology 08/04/21. Per OV note, "Preoperative cardiac evaluation: We have been asked to give medical clearance for an upcoming cystoscopy.  He has had no recent ischemic evaluation and is quite sedentary due to stroke 12/21.  We will get [facility] for risk stratification. On previous lexiscan 3/17, there was a moderate size and intensity fixed apical, anterior apical defect suggestive of scar, intermediate risk study.  We will take this into consideration when reviewing the results of Lexiscan."  Myocardial Perfusion 08/08/2021 low risk study.   Anticipate pt can proceed with planned procedure barring acute status change.   VS: BP 136/78    Pulse (!) 53    Temp 36.7 C (Oral)    Resp 16    Ht 6\' 2"  (1.88 m)    Wt 75.8 kg    SpO2 100%    BMI 21.44 kg/m   PROVIDERS: , NP is PCP   Dema Severin, MD is Cardiologist  LABS: Labs reviewed: Acceptable for surgery. (all labs ordered are listed, but only abnormal results are displayed)  Labs Reviewed  CBC - Abnormal; Notable for the following components:      Result Value   RBC 4.10 (*)    Hemoglobin 12.3 (*)    HCT 38.2 (*)    All other components within normal limits  BASIC METABOLIC PANEL - Abnormal; Notable for the following components:   Glucose, Bld 101 (*)    BUN 25 (*)    Calcium 8.8 (*)    All other components within normal limits  SURGICAL PCR SCREEN     IMAGES:   EKG: 07/28/2021 Rate 51 bpm  Atrial fibrillation with  slow ventricular response Abnormal ECG When compared with ECG of 24-Jul-2020 15:51, No significant change since last tracing  CV: Myocardial Perfusion 08/08/2021   The study is normal. The study is low risk.   No ST deviation was noted.   LV perfusion is normal. There is no evidence of ischemia. There is evidence of infarction.   Not gated No LV function due to afib . End diastolic cavity size is normal. End systolic cavity size is normal.   Prior study available for comparison from 09/22/2015.   Normal resting and stress perfusion. No ischemia or infarction EF Diaphragmatic attenuation Not gated with no EF estimate due to atrial fibrillation   Echo 05/01/2021 Atrial fibrillation Overall left ventricular systolic function is low-normal with, an EF between 50-55% There is mild aortic regurgitation There is no evidence of aortic stenosis Mild mitral annular calcification present Mild mitral regurgitation is present.   Echo 06/10/2020 1. Left ventricular ejection fraction, by estimation, is 60 to 65%. The  left ventricle has normal function. The left ventricle has no regional  wall motion abnormalities. There is moderate left ventricular hypertrophy.  Left ventricular diastolic function   could not be evaluated.   2. Right ventricular systolic function is normal. The right ventricular  size is normal. Tricuspid regurgitation signal is  inadequate for assessing  PA pressure.   3. Left atrial size was mild to moderately dilated.   4. The mitral valve is normal in structure. Trivial mitral valve  regurgitation.   5. The aortic valve is tricuspid. There is mild calcification of the  aortic valve. There is mild thickening of the aortic valve. Aortic valve  regurgitation is mild. Mild aortic valve stenosis.   6. Aortic dilatation noted. There is mild dilatation of the ascending  aorta, measuring 42 mm.  Past Medical History:  Diagnosis Date   Abdominal aortic aneurysm 2006   status  post endoluminal stent graft at [facility] in 2006   Arthritis    Coronary artery disease    GERD (gastroesophageal reflux disease)    History of shingles    TIMES 2   Hyperlipidemia    Hypertension    MRSA (methicillin resistant Staphylococcus aureus) infection    PONV (postoperative nausea and vomiting)    Sleep apnea    pt scored 5 per Stop Bang tool at PAT visit 10/26/2015; results sent to Mauricio Po NP   Stroke [facility]) 05/2020   Rt side weak   Vertigo     Past Surgical History:  Procedure Laterality Date   ABDOMINAL AORTIC ANEURYSM REPAIR W/ ENDOLUMINAL GRAFT     13 years ago    ANGIOPLASTY     branch stenting  06/08/2004   Dr. Lavonne Chick   CARDIAC CATHETERIZATION  06/08/2004   CARDIOVASCULAR STRESS TEST  02/14/2009   I&D of left knee      IR GASTROSTOMY TUBE MOD SED  06/29/2020   LUMBAR LAMINECTOMY/DECOMPRESSION MICRODISCECTOMY N/A 11/02/2015   Procedure: MICROLUMBAR DECOMPRESSION L3-L4, L4-L5, AND L5-S1;  Surgeon: Jene Every, MD;  Location: [facility];  Service: Orthopedics;  Laterality: N/A;   PICC LINE PLACE PERIPHERAL ([facility] HX)     TRANSTHORACIC ECHOCARDIOGRAM  06/07/2004    MEDICATIONS:  acetaminophen (TYLENOL) 160 MG/5ML solution   amLODipine (NORVASC) 5 MG tablet   clopidogrel (PLAVIX) 75 MG tablet   docusate sodium (COLACE) 100 MG capsule   famotidine (PEPCID) 40 MG tablet   finasteride (PROSCAR) 5 MG tablet   gabapentin (NEURONTIN) 100 MG capsule   MAGNESIUM OXIDE PO   metoprolol tartrate (LOPRESSOR) 50 MG tablet   mupirocin cream (BACTROBAN) 2 %   nystatin (MYCOSTATIN/NYSTOP) powder   pantoprazole sodium (PROTONIX) 40 mg/20 mL PACK   STUDY - ASPIRE - apixaban 5 mg or placebo tablet (PI-Sethi)   STUDY - ASPIRE - aspirin 81 mg or placebo tablet (PI-Sethi)   No current facility-administered medications for this encounter.   Jodell Cipro Ward, PA-C [facility]-Surgical Testing [phone]

## 2021-08-08 NOTE — Anesthesia Preprocedure Evaluation (Addendum)
Anesthesia Evaluation  Patient identified by MRN, date of birth, ID band Patient awake    Reviewed: Allergy & Precautions, NPO status , Patient's Chart, lab work & pertinent test results, reviewed documented beta blocker date and time   History of Anesthesia Complications (+) PONV and history of anesthetic complications  Airway Mallampati: III  TM Distance: >3 FB Neck ROM: Full    Dental  (+) Dental Advisory Given, Missing, Poor Dentition   Pulmonary sleep apnea ,    Pulmonary exam normal        Cardiovascular hypertension, Pt. on medications and Pt. on home beta blockers + CAD  Normal cardiovascular exam+ dysrhythmias Atrial Fibrillation    Abdominal AA s/p stent  '23 Myoperfusion - The study is normal. The study is low risk.   No ST deviation was noted.   LV perfusion is normal. There is no evidence of ischemia. There is evidence of infarction.   Not gated No LV function due to afib . End diastolic cavity size is normal. End systolic cavity size is normal.  '22 TTE - EF 50-55%, mild AI/MR/TR    Neuro/Psych  Vertigo  CVA, Residual Symptoms negative psych ROS   GI/Hepatic Neg liver ROS, GERD  Medicated and Controlled,  Endo/Other   Pre-DM   Renal/GU negative Renal ROS     Musculoskeletal  (+) Arthritis ,   Abdominal   Peds  Hematology  (+) Blood dyscrasia, anemia ,  On plavix    Anesthesia Other Findings   Reproductive/Obstetrics                           Anesthesia Physical Anesthesia Plan  ASA: 4  Anesthesia Plan: General   Post-op Pain Management: Tylenol PO (pre-op)*   Induction: Intravenous  PONV Risk Score and Plan: 3 and Treatment may vary due to age or medical condition and Ondansetron  Airway Management Planned: LMA  Additional Equipment: None  Intra-op Plan:   Post-operative Plan: Extubation in OR  Informed Consent: I have reviewed the patients  History and Physical, chart, labs and discussed the procedure including the risks, benefits and alternatives for the proposed anesthesia with the patient or authorized representative who has indicated his/her understanding and acceptance.     Dental advisory given  Plan Discussed with: CRNA and Anesthesiologist  Anesthesia Plan Comments:       Anesthesia Quick Evaluation

## 2021-08-08 NOTE — Telephone Encounter (Signed)
DPR ok to s/w the pt's wife who has been notified the pt stress test is normal and pt has been cleared for his surgery. Pt's wife thanked me for the call and the help.

## 2021-08-08 NOTE — Telephone Encounter (Signed)
-----   Message from Emmaline Life, NP sent at 08/08/2021  4:40 PM EST ----- Please notify patient and family that stress test was low risk with no evidence of ischemia or infarction. He may proceed with his surgery planned for 08/08/21.

## 2021-08-09 ENCOUNTER — Encounter (HOSPITAL_COMMUNITY): Payer: Self-pay | Admitting: Urology

## 2021-08-09 ENCOUNTER — Ambulatory Visit (HOSPITAL_COMMUNITY)
Admission: RE | Admit: 2021-08-09 | Discharge: 2021-08-09 | Disposition: A | Discharge status: Home / Self Care | Payer: No Typology Code available for payment source | Attending: Urology | Admitting: Urology

## 2021-08-09 ENCOUNTER — Ambulatory Visit (HOSPITAL_BASED_OUTPATIENT_CLINIC_OR_DEPARTMENT_OTHER): Payer: No Typology Code available for payment source | Admitting: Certified Registered Nurse Anesthetist

## 2021-08-09 ENCOUNTER — Ambulatory Visit (HOSPITAL_COMMUNITY): Payer: No Typology Code available for payment source | Admitting: Physician Assistant

## 2021-08-09 ENCOUNTER — Encounter (HOSPITAL_COMMUNITY)
Admission: RE | Disposition: A | Discharge status: Home / Self Care | Payer: Self-pay | Source: Home / Self Care | Attending: Urology

## 2021-08-09 DIAGNOSIS — N2889 Other specified disorders of kidney and ureter: Secondary | ICD-10-CM | POA: Diagnosis not present

## 2021-08-09 DIAGNOSIS — I6990 Unspecified sequelae of unspecified cerebrovascular disease: Secondary | ICD-10-CM | POA: Insufficient documentation

## 2021-08-09 DIAGNOSIS — Z7902 Long term (current) use of antithrombotics/antiplatelets: Secondary | ICD-10-CM | POA: Diagnosis not present

## 2021-08-09 DIAGNOSIS — I10 Essential (primary) hypertension: Secondary | ICD-10-CM | POA: Insufficient documentation

## 2021-08-09 DIAGNOSIS — G4730 Sleep apnea, unspecified: Secondary | ICD-10-CM | POA: Insufficient documentation

## 2021-08-09 DIAGNOSIS — R7303 Prediabetes: Secondary | ICD-10-CM | POA: Diagnosis not present

## 2021-08-09 DIAGNOSIS — K219 Gastro-esophageal reflux disease without esophagitis: Secondary | ICD-10-CM | POA: Diagnosis not present

## 2021-08-09 DIAGNOSIS — I2510 Atherosclerotic heart disease of native coronary artery without angina pectoris: Secondary | ICD-10-CM

## 2021-08-09 DIAGNOSIS — N210 Calculus in bladder: Secondary | ICD-10-CM | POA: Diagnosis present

## 2021-08-09 DIAGNOSIS — Z955 Presence of coronary angioplasty implant and graft: Secondary | ICD-10-CM | POA: Diagnosis not present

## 2021-08-09 DIAGNOSIS — I4891 Unspecified atrial fibrillation: Secondary | ICD-10-CM | POA: Diagnosis not present

## 2021-08-09 HISTORY — PX: CYSTOSCOPY WITH LITHOLAPAXY: SHX1425

## 2021-08-09 SURGERY — CYSTOSCOPY, WITH BLADDER CALCULUS LITHOLAPAXY
Anesthesia: General | Site: Bladder

## 2021-08-09 MED ORDER — LACTATED RINGERS IV SOLN: INTRAVENOUS | Status: DC

## 2021-08-09 MED ORDER — OXYCODONE HCL 5 MG/5ML PO SOLN: 5.0000 mg | Freq: Once | ORAL | Status: DC | PRN

## 2021-08-09 MED ORDER — OXYCODONE HCL 5 MG PO TABS: 5.0000 mg | ORAL_TABLET | Freq: Once | ORAL | Status: DC | PRN

## 2021-08-09 MED ORDER — CHLORHEXIDINE GLUCONATE 0.12 % MT SOLN
15.0000 mL | Freq: Once | OROMUCOSAL | Status: AC
  Administered 2021-08-09: 15 mL via OROMUCOSAL

## 2021-08-09 MED ORDER — PHENYLEPHRINE HCL-NACL 20-0.9 MG/250ML-% IV SOLN
INTRAVENOUS | Status: AC
  Filled 2021-08-09: qty 250

## 2021-08-09 MED ORDER — DEXAMETHASONE SODIUM PHOSPHATE 10 MG/ML IJ SOLN
INTRAMUSCULAR | Status: DC | PRN
  Administered 2021-08-09: 5 mg via INTRAVENOUS

## 2021-08-09 MED ORDER — ONDANSETRON HCL 4 MG/2ML IJ SOLN
INTRAMUSCULAR | Status: AC
  Filled 2021-08-09: qty 2

## 2021-08-09 MED ORDER — ONDANSETRON HCL 4 MG/2ML IJ SOLN
INTRAMUSCULAR | Status: DC | PRN
Start: 2021-08-09 — End: 2021-08-09
  Administered 2021-08-09: 4 mg via INTRAVENOUS

## 2021-08-09 MED ORDER — FENTANYL CITRATE (PF) 100 MCG/2ML IJ SOLN
INTRAMUSCULAR | Status: AC
  Filled 2021-08-09: qty 2

## 2021-08-09 MED ORDER — ACETAMINOPHEN 10 MG/ML IV SOLN
INTRAVENOUS | Status: AC
  Filled 2021-08-09: qty 100

## 2021-08-09 MED ORDER — ACETAMINOPHEN 500 MG PO TABS: 1000.0000 mg | ORAL_TABLET | Freq: Once | ORAL | Status: DC

## 2021-08-09 MED ORDER — ORAL CARE MOUTH RINSE: 15.0000 mL | Freq: Once | OROMUCOSAL | Status: AC

## 2021-08-09 MED ORDER — SODIUM CHLORIDE 0.9 % IR SOLN
Status: DC | PRN
Start: 2021-08-09 — End: 2021-08-09
  Administered 2021-08-09: 3000 mL via INTRAVESICAL

## 2021-08-09 MED ORDER — OXYCODONE HCL 5 MG PO CAPS: 5.0000 mg | ORAL_CAPSULE | ORAL | 0 refills | Status: DC | PRN

## 2021-08-09 MED ORDER — DEXAMETHASONE SODIUM PHOSPHATE 10 MG/ML IJ SOLN
INTRAMUSCULAR | Status: AC
  Filled 2021-08-09: qty 1

## 2021-08-09 MED ORDER — FENTANYL CITRATE PF 50 MCG/ML IJ SOSY: 25.0000 ug | PREFILLED_SYRINGE | INTRAMUSCULAR | Status: DC | PRN

## 2021-08-09 MED ORDER — PROPOFOL 10 MG/ML IV BOLUS
INTRAVENOUS | Status: AC
  Filled 2021-08-09: qty 20

## 2021-08-09 MED ORDER — PROPOFOL 10 MG/ML IV BOLUS
INTRAVENOUS | Status: DC | PRN
Start: 2021-08-09 — End: 2021-08-09
  Administered 2021-08-09: 100 mg via INTRAVENOUS

## 2021-08-09 MED ORDER — CEFAZOLIN SODIUM-DEXTROSE 2-4 GM/100ML-% IV SOLN
2.0000 g | INTRAVENOUS | Status: AC
  Administered 2021-08-09: 2 g via INTRAVENOUS
  Filled 2021-08-09: qty 100

## 2021-08-09 MED ORDER — FENTANYL CITRATE (PF) 100 MCG/2ML IJ SOLN
INTRAMUSCULAR | Status: DC | PRN
  Administered 2021-08-09: 25 ug via INTRAVENOUS

## 2021-08-09 MED ORDER — PHENYLEPHRINE 40 MCG/ML (10ML) SYRINGE FOR IV PUSH (FOR BLOOD PRESSURE SUPPORT)
PREFILLED_SYRINGE | INTRAVENOUS | Status: DC | PRN
  Administered 2021-08-09: 80 ug via INTRAVENOUS
  Administered 2021-08-09: 120 ug via INTRAVENOUS

## 2021-08-09 MED ORDER — LIDOCAINE 2% (20 MG/ML) 5 ML SYRINGE
INTRAMUSCULAR | Status: DC | PRN
Start: 2021-08-09 — End: 2021-08-09
  Administered 2021-08-09: 60 mg via INTRAVENOUS

## 2021-08-09 MED ORDER — ONDANSETRON HCL 4 MG/2ML IJ SOLN: 4.0000 mg | Freq: Once | INTRAMUSCULAR | Status: DC | PRN

## 2021-08-09 SURGICAL SUPPLY — 20 items
BAG URO CATCHER STRL LF (MISCELLANEOUS) ×2 IMPLANT
CLOTH BEACON ORANGE TIMEOUT ST (SAFETY) ×2 IMPLANT
GLOVE SURG ENC MOIS LTX SZ7.5 (GLOVE) ×2 IMPLANT
GLOVE SURG UNDER POLY LF SZ7 (GLOVE) ×2 IMPLANT
GOWN SRG XL LVL 4 BRTHBL STRL (GOWNS) IMPLANT
GOWN STRL NON-REIN XL LVL4 (GOWNS) ×2
GOWN STRL REUS W/TWL XL LVL3 (GOWN DISPOSABLE) ×4 IMPLANT
IV NS IRRIG 3000ML ARTHROMATIC (IV SOLUTION) ×1 IMPLANT
KIT TURNOVER KIT A (KITS) ×1 IMPLANT
LASER FIB FLEXIVA PULSE ID 550 (Laser) ×1 IMPLANT
LASER FIB FLEXIVA PULSE ID 910 (Laser) ×1 IMPLANT
MANIFOLD NEPTUNE II (INSTRUMENTS) ×2 IMPLANT
NS IRRIG 1000ML POUR BTL (IV SOLUTION) IMPLANT
PACK CYSTO (CUSTOM PROCEDURE TRAY) ×2 IMPLANT
SYR TOOMEY IRRIG 70ML (MISCELLANEOUS)
SYRINGE TOOMEY IRRIG 70ML (MISCELLANEOUS) IMPLANT
TRAY FOL W/BAG SLVR 16FR STRL (SET/KITS/TRAYS/PACK) IMPLANT
TRAY FOLEY W/BAG SLVR 16FR LF (SET/KITS/TRAYS/PACK) ×2
TUBING CONNECTING 10 (TUBING) IMPLANT
TUBING UROLOGY SET (TUBING) ×2 IMPLANT

## 2021-08-09 NOTE — H&P (Signed)
CC/HPI: CC: Gross hematuria, renal calculi  HPI:  08/15/2020  81 year old male went to the emergency department about 3 weeks ago. He had catheterization by a nurse. He states that they had a difficult time placing the catheter to get a urine sample. Afterwards, he experienced gross hematuria which has been occurring since then but is clearing up. He is on blood thinner. He has had multiple cardiac stents. He also has a stent for a AAA. He will under went CT scan of the abdomen and pelvis without contrast that revealed bilateral nonobstructing calculi measuring up to 1 cm. He is asymptomatic from this standpoint. He had a large amount of stool burden during the scan and he has been disimpacted since.   12/16/2020  Patient has been taking finasteride. No problems with voiding. He denies any further gross hematuria. He has no complaints today.   07/07/2021  In the interval, the patient went to the emergency department on 06/15/2021. At that time, he was noted to have a 1 cm ureterovesicular junction calculus on the right. He also had some nonobstructing calculi bilaterally in each kidney. He came back to the emergency department again on 07/03/2021. Another CT scan was performed that revealed the calculus had lodged in his prostatic urethra. Bladder was full with bilateral hydronephrosis. Foley catheter was placed.   08/09/21 Patient presents today for cystoscopy lithotripsy of bladder stone.    ALLERGIES: crestor lipitor    MEDICATIONS: Finasteride 5 mg tablet  Lisinopril 2.5 mg tablet  Metformin Hcl 500 mg tablet  Metoprolol Tartrate 50 mg tablet  Acetaminophen 500 mg tablet  Dok 100 mg tablet  Magnesium Gluconate  Magnesium Oxide 400 mg magnesium tablet  Meclizine Hcl 12.5 mg tablet  Milk Of Magnesia 400 mg/5 ml suspension, oral  Mirtazapine 15 mg tablet  Neurontin 250 mg/5 ml solution, oral  Pantoprazole Sodium 40 mg tablet, delayed release  Protonix 40 mg granules delayed release for  susp packet  Ropinirole Hcl 1 mg tablet  Tramadol Hcl 50 mg tablet     GU PSH: No GU PSH      PSH Notes: finger surgery-1983   NON-GU PSH: Back surgery Heart Surgery (Unspecified) Knee Arthroscopy/surgery     GU PMH: Gross hematuria - 12/16/2020, - 08/15/2020 Renal calculus - 12/16/2020, - 08/15/2020    NON-GU PMH: Abdominal aortic aneurysm Arthritis Atherosclerotic heart disease of native coronary artery without angina pectoris Difficulty in walking, not elsewhere classified 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 Dysphagia, oropharyngeal phase Flaccid hemiplegia affecting right dominant side GERD Hemiplegia and hemiparesis following cerebral infarction affecting right dominant side Hyperlipidemia, unspecified Hypertension Hypomagnesemia Need for assistance with personal care 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   Tobacco Use Assessment Completed: Used Tobacco in last 30 days? Does not use smokeless tobacco. Has never drank.  Does not drink caffeine. Patient's occupation Clinical cytogeneticist.    REVIEW OF SYSTEMS:    GU Review Male:   Patient denies frequent urination, hard to postpone urination, burning/ pain with urination, get up at night to urinate, leakage of urine, stream starts and stops, trouble starting your stream, have to strain to urinate , erection problems, and penile pain.  Gastrointestinal (Upper):   Patient denies nausea, vomiting, and indigestion/ heartburn.  Gastrointestinal (Lower):   Patient  denies diarrhea and constipation.  Constitutional:   Patient denies fever, night sweats, weight loss, and fatigue.  Skin:   Patient denies skin rash/ lesion and itching.  Eyes:   Patient denies blurred vision and double vision.  Ears/ Nose/ Throat:   Patient denies sore throat and sinus problems.  Hematologic/Lymphatic:   Patient denies swollen glands and easy bruising.  Cardiovascular:   Patient denies leg swelling and chest pains.  Respiratory:   Patient denies cough and shortness of breath.  Endocrine:   Patient denies excessive thirst.  Musculoskeletal:   Patient denies joint pain and back pain.  Neurological:   Patient denies headaches and dizziness.  Psychologic:   Patient denies depression and anxiety.   VITAL SIGNS: None   GU PHYSICAL EXAMINATION:    Penis: Foley catheter draining clear yellow urine   MULTI-SYSTEM PHYSICAL EXAMINATION:       Complexity of Data:  Source Of History:  Patient  Records Review:   Previous Doctor Records, Previous Patient Records  X-Ray Review: C.T. Abdomen/Pelvis: Reviewed Films. Reviewed Report. Discussed With Patient.     PROCEDURES: None   ASSESSMENT:      ICD-10 Details  1 GU:   Renal calculus - N20.0 Chronic, Stable  2   Urinary Retention - R33.8 Undiagnosed New Problem  3   Bladder Stone - N21.0 Undiagnosed New Problem   PLAN:           Document Letter(s):  Created for Patient: Clinical Summary         Notes:   Plan for cystoscopy with lithotripsy of the bladder stone. Risk and benefits discussed. Also discussed the option of ureteroscopy. Given his multiple medical comorbidities, he would like to do the least invasive option which would be to quickly remove the bladder stone. This will most likely relieve his retention.

## 2021-08-09 NOTE — Transfer of Care (Signed)
Immediate Anesthesia Transfer of Care Note  Patient: [name]  Procedure(s) Performed: CYSTOSCOPY WITH LITHOLAPAXY (Bladder)  Patient Location: PACU  Anesthesia Type:General  Level of Consciousness: awake, drowsy and patient cooperative  Airway & Oxygen Therapy: Patient Spontanous Breathing and Patient connected to face mask oxygen  Post-op Assessment: Report given to RN and Post -op Vital signs reviewed and stable  Post vital signs: Reviewed and stable  Last Vitals:  Vitals Value Taken Time  BP 110/70 08/09/21 1018  Temp    Pulse 61 08/09/21 1020  Resp 16 08/09/21 1020  SpO2 100 % 08/09/21 1020  Vitals shown include unvalidated device data.  Last Pain:  Vitals:   08/09/21 0822  TempSrc:   PainSc: 0-No pain         Complications: No notable events documented.

## 2021-08-09 NOTE — Anesthesia Procedure Notes (Signed)
Procedure Name: LMA Insertion Date/Time: 08/09/2021 9:49 AM Performed by: Wynonia Sours, CRNA Pre-anesthesia Checklist: Patient identified, Emergency Drugs available, Suction available, Patient being monitored and Timeout performed Patient Re-evaluated:Patient Re-evaluated prior to induction Oxygen Delivery Method: Circle system utilized Preoxygenation: Pre-oxygenation with 100% oxygen Induction Type: IV induction LMA: LMA with gastric port inserted LMA Size: 5.0 Number of attempts: 2 Tube secured with: Tape Dental Injury: Teeth and Oropharynx as per pre-operative assessment  Comments: LMA #4 had large airleak, switched to LMA #5.

## 2021-08-09 NOTE — Op Note (Deleted)
Operative Note °  °Preoperative diagnosis:  °1.  Left renal mass °2.  Bladder stone °  °Postoperative diagnosis: °1.  Left renal mass °2.  Bladder stone less than 2.5 cm °  °Procedure(s): °1.  Cystolithotripsy less than 2.5 cm °2.  Left retrograde pyelogram, left diagnostic ureteroscopy, left ureteral stent °  °Surgeon: Aeris Hersman, MD °  °Assistants: None °  °Anesthesia: General °  °Complications: None immediate °  °EBL: Minimal °  °Specimens: °1.  None °  °Drains/Catheters: °1.  6 x 26 double-J ureteral stent °  °Intraoperative findings: 1.  Normal anterior urethra 2.  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  There was significant hypertrophy. °3.  Less than 2.5 cm bladder stone that was fragmented to smaller fragments and extracted.  No obvious bladder tumors. °4.  Left ureteroscopy revealed no evidence of urothelial tumors.  Retrograde pyelogram revealed some blunting of the calyces likely secondary to the centralized renal mass.  No obvious filling defects within the collecting system. °  °Indication: 80-year-old male with a left renal mass that was centralized presents for diagnostic ureteroscopy.  He also had a bladder stone and presents for removal of the stone. °  °Description of procedure: °  °The patient was identified and consent was obtained.  The patient was taken to the operating room and placed in the supine position.  The patient was placed under general anesthesia.  Perioperative antibiotics were administered.  The patient was placed in dorsal lithotomy.  Patient was prepped and draped in a standard sterile fashion and a timeout was performed. °  °A 21 French rigid cystoscope was advanced into the urethra and into the bladder.  Complete cystoscopy was performed with the findings noted above.  The stone was laser fragmented to smaller fragments and then extracted through the scope.  I reinspected the bladder mucosa and there was no evidence of any perforation.  I turned my  attention to the left ureteral orifice and advanced a sensor wire up the left ureter and into the kidney under fluoroscopic guidance.  A second wire was advanced alongside this and into the kidney.  The scope was withdrawn.  One of the wires was secured to the drape and the other wire was used to advance a 12 x 14 ureteral access sheath over the wire under continuous fluoroscopic guidance.  The inner sheath and wire were withdrawn.  Digital ureteroscopy was performed and all the calyces were inspected.  There was no obvious urothelial tumor.  I shot a retrograde pyelogram through the scope with the findings noted above.  I then withdrew the scope along with the access sheath visualizing the entire ureter upon removal.  There were no ureteral tumors, stones, or significant injury.  I backloaded the wire onto rigid cystoscope and advanced that into the bladder followed by routine placement of a 6 x 26 double-J ureteral stent.  Fluoroscopy confirmed proximal placement and direct visualization confirmed a good coil within the bladder.  I drained the bladder and withdrew the scope.  Patient tolerated the procedure well and was stable postoperative. °  °Plan: Follow-up in 1 week for stent removal.  He will be scheduled for left hand-assisted laparoscopic radical nephrectomy. °

## 2021-08-09 NOTE — Anesthesia Postprocedure Evaluation (Signed)
Anesthesia Post Note  Patient: [name]  Procedure(s) Performed: CYSTOSCOPY WITH LITHOLAPAXY (Bladder)     Patient location during evaluation: PACU Anesthesia Type: General Level of consciousness: awake and alert Pain management: pain level controlled Vital Signs Assessment: post-procedure vital signs reviewed and stable Respiratory status: spontaneous breathing, nonlabored ventilation and respiratory function stable Cardiovascular status: stable and blood pressure returned to baseline Anesthetic complications: no   No notable events documented.  Last Vitals:  Vitals:   08/09/21 1045 08/09/21 1107  BP: 126/71 134/75  Pulse: 63 60  Resp: 10 14  Temp:  (!) 36.3 C  SpO2: 100% 99%    Last Pain:  Vitals:   08/09/21 1107  TempSrc: Oral  PainSc: 0-No pain                 Beryle Lathe

## 2021-08-10 ENCOUNTER — Encounter (HOSPITAL_COMMUNITY): Payer: Self-pay | Admitting: Urology

## 2021-08-11 NOTE — Op Note (Signed)
Operative Note  Preoperative diagnosis:  1.  Bladder stone less than 2.5 cm  Postoperative diagnosis: 1.  Bladder stone less than 2.5 cm  Procedure(s): 1.  Cystoscopy with cystolithotripsy of stone, less than 2.5 cm  Surgeon: Modena Slater, MD  Assistants: None  Anesthesia: General  Complications: None immediate  EBL: Minimal  Specimens: 1.  None  Drains/Catheters: 1.  16 French Foley catheter  Intraoperative findings: 1.  Normal anterior urethra 2.  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 3.  Normal bladder mucosa except for some trabeculation.  There was a 1 to 1.5 cm bladder calculus that was fragmented to smaller fragments and extracted  Indication: 81 year old male with a bladder stone presents for the previously mentioned operation.  Description of procedure:  The patient was identified and consent was obtained.  The patient was taken to the operating room and placed in the supine position.  The patient was placed under general anesthesia.  Perioperative antibiotics were administered.  The patient was placed in dorsal lithotomy.  Patient was prepped and draped in a standard sterile fashion and a timeout was performed.  A 21 French rigid cystoscope was advanced into the urethra and into the bladder.  Complete cystoscopy was performed with findings noted above.  The stone was laser fragmented to smaller fragments and then extracted through the scope.  No other stone fragments remained after this.  There was no evidence of any bladder injury or perforation after treatment of the stone.  I withdrew the scope and placed a 16 French Foley catheter.  This concluded the operation.  Patient tolerated the procedure well was stable postoperative.  Plan: Follow-up in a few days for voiding trial

## 2021-08-15 ENCOUNTER — Ambulatory Visit (HOSPITAL_COMMUNITY): Payer: No Typology Code available for payment source | Attending: Cardiovascular Disease

## 2021-08-15 ENCOUNTER — Other Ambulatory Visit: Payer: Self-pay

## 2021-08-15 DIAGNOSIS — I714 Abdominal aortic aneurysm, without rupture, unspecified: Secondary | ICD-10-CM

## 2021-08-15 DIAGNOSIS — I712 Thoracic aortic aneurysm, without rupture, unspecified: Secondary | ICD-10-CM | POA: Diagnosis not present

## 2021-08-15 LAB — ECHOCARDIOGRAM COMPLETE
Area-P 1/2: 4.15 cm2
P 1/2 time: 324 msec
S' Lateral: 3.5 cm

## 2021-08-17 ENCOUNTER — Other Ambulatory Visit (HOSPITAL_BASED_OUTPATIENT_CLINIC_OR_DEPARTMENT_OTHER): Payer: Self-pay | Admitting: Nurse Practitioner

## 2021-08-17 DIAGNOSIS — I712 Thoracic aortic aneurysm, without rupture, unspecified: Secondary | ICD-10-CM

## 2021-09-06 ENCOUNTER — Ambulatory Visit (HOSPITAL_COMMUNITY): Admission: RE | Admit: 2021-09-06 | Payer: No Typology Code available for payment source | Source: Ambulatory Visit

## 2021-10-31 ENCOUNTER — Encounter: Payer: Self-pay | Admitting: Adult Health

## 2021-10-31 ENCOUNTER — Ambulatory Visit (INDEPENDENT_AMBULATORY_CARE_PROVIDER_SITE_OTHER): Payer: No Typology Code available for payment source | Admitting: Adult Health

## 2021-10-31 VITALS — BP 122/86 | HR 66

## 2021-10-31 DIAGNOSIS — I613 Nontraumatic intracerebral hemorrhage in brain stem: Secondary | ICD-10-CM

## 2021-10-31 DIAGNOSIS — G8111 Spastic hemiplegia affecting right dominant side: Secondary | ICD-10-CM

## 2021-10-31 NOTE — Progress Notes (Signed)
?[facility] ?[id number] ?[place]. [place] [id number] ?[phone] ? ?     STROKE FOLLOW UP NOTE ? ?Mr. Earleen ReaperJames Bannan ?Date of Birth:  01-03-1941 ?Medical Record Number:  [id number]  ? ?Reason for Referral: stroke follow up ? ? ? ?SUBJECTIVE: ? ? ?CHIEF COMPLAINT:  ?Chief Complaint  ?Patient presents with  ? Follow-up  ?  Rm 2 with daughter here for 6 month f/u- Daughter reports pt has been doing ok; no change.   ? ? ? ?HPI:  ? ?Update 10/31/2021 JM: Patient returns for stroke follow-up after prior visit 9 months ago.  He is accompanied by his daughter.  Overall stable from stroke standpoint without new stroke/TIA symptoms.  Residual right spastic hemiparesis and dysarthria. Able to stand from seating position but [facility].  Otherwise nonambulatory. Placed on magnesium supplement with improvement of spasticity pains.  Still has PEG tube but has not used over the past couple of months.  He has been maintaining a regular diet without difficulty. [facility] dietician wants to ensure weight remains stable and then hopefully have PEG removed.  Lives at home with family. Still needs assistance for majority of ADLs but has been able to put on his shirt, socks and shoes (slip ons) independently.  Continues to attend [facility] program 4 days weekly where he continues to work with PT/OT.  Continues to participate in ASPIRE trial [facility] receiving either Eliquis or aspirin, denies side effects.  Blood pressure today 122/86.  Continues to follow with cardiology for history of chronic A-fib, CAD, and aortic aneurysm.  Direct LDL 52 (07/2021).  No new concerns at this time. ? ? ? ? ?History provided for reference purposes only ?Update 01/24/2021 JM: Mr. [name] returns for prolonged stroke follow-up after prior visit 6 months ago.  He has since returned back home 4/1. [facility] where he has been receiving therapies.  Reports residual right spastic hemiparesis, dysarthria and dysphagia - per wife, not much improvement since prior visit.  Wife is concerned that he is no longer on baclofen -contacted facility who reported somnolence on baclofen therefore discontinued.  He has not had routine follow-up with Dr. Wynn BankerKirsteins.  Spasticity painful at times at night but otherwise no pain just more stiffness.  He remains nonambulatory and [facility].  His wife is his primary caregiver at home.  Still has PEG tube - had swallow eval at NH back in Feb/March per wife. Able to feed himself - will not drink thickened liquid - will have difficulty at times with thin liquids. Doing okay with food - able to eat softer foods cut in small pieces. Has difficulty with chewier foods. Does not receive supplementale nutrition any longer - is [facility].  Denies new or worsening stroke/TIA symptoms. Continues to participate in ASPIRE trial through [employer] research on either aspirin OR Eliquis.  Blood pressure today 159/96.  No further concerns at this time. ? ?Initial visit 08/15/2020: Mr. [name] is being seen for hospital follow-up accompanied by his wife. ? ?He was [facility] from [facility] on 07[facility]08/2020 due to continued residual deficits including right spastic hemiparesis, dysarthria and dysphagia s/p gastrostomy tube 1/5 (during CIR admission).  He continues to receive nightly supplemental nutrition and is on a softened diet with thickened liquids during the day.  He plans on repeating swallowing eval and currently awaiting to schedule study.  He remains nonambulatory and transfers via wheelchair.  Currently working with PT/OT/SLP.  Denies new or worsening stroke/TIA symptoms.  ? ?Continues to participate in ASPIRE trial where he is either on aspirin or apixaban although per review of facility MAR, both aspirin and apixaban prescribed.  He has been having ongoing issues with hematuria which was told from  traumatic Foley catheter insertion for urine retention.  He continues to experience mild hematuria but overall improving.  He was evaluated by urology today. ?Blood pressure today 125/72 ? ?Evaluated at [facility] ED on 07/24/2020 after episode of dizziness followed by unresponsiveness for 10-15 min while SNF staff transitioning him from bed to chair.  No additional syncopal events.  Unclear/unknown etiology. ? ?Stroke admission 06/10/2020 ?Mr. Jarmarcus Wambold is a 81 y.o. male with history of  atrial fibrillation (on Eliquis - but missed last evening dose), AAA s/p stent graft in 2006, CAD, shingles x 2, HLD, HTN, sleep apnea and vertigo who presented to [facility] ED on 06/10/2020 with severe Rt sided weakness, facial droop and severe dysarthria.  Personally reviewed hospitalization pertinent progress notes, lab work and imaging.  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  CTA head/neck significant b/l vertebral artery stenosis and left ICA 65 to 70% stenosis.  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  Chronic A. fib on Eliquis PTA -due to ICH and chronic microhemorrhages he was deemed to not be appropriate anticoagulation candidate.  Other stroke risk factors include HTN, CAD, OSA and AAA s/p stent in 2006.  Residual deficits of severe dysarthria, dense hemiplegia and dysphagia. ? 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 No mass effect, extra-axial- or intraventricular extension of blood at this time. 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 Hemodynamically significant bilateral vertebral artery stenoses (bilateral distal V4 and also dominant Left vertebral origin). Bilateral calcified carotid artery atherosclerosis, 65-70% stenosis at the Left ICA bulb. ?MRI head ventral pontine acute hemorrhage 14 mm.  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 ?2D Echo - EF 60 - 65%. No cardiac source of emboli identified.  ?Loyal Jacobson Virus 2 - negative ?LDL - 84 ?HgbA1c - 6.1 ?UDS - neg ?VTE prophylaxis - SCDs ?Eliquis (apixaban) daily prior to admission, now on No antithrombotic ?Ongoing aggressive stroke risk factor management. Given Amyloid and ICH, anticoagulation not recommended ?Therapy recommendations: CIR ?Disposition:  CIR on 06/16/2020 ? ?Chronic afib ?On eliquis PTA ?Received Kcentra reversal  ?Now on no antithrombotics ?Given MRI showing numerous chronic microhemorrhages bilaterally, concerning for hypertensive versus cerebral amyloid, likely not to be future anticoagulation candidate ? ? ? ? ?ROS:   ?14 system review of systems performed and negative with exception of those listed in HPI ? ?PMH:  ?Past Medical History:  ?Diagnosis Date  ? Abdominal aortic aneurysm [facility]) 2006  ? status post endoluminal stent graft at [facility] in 2006  ? Arthritis   ? Coronary artery disease   ? GERD (gastroesophageal reflux disease)   ? History of shingles   ? TIMES 2  ?  Hyperlipidemia   ? Hypertension   ? MRSA (methicillin resistant Staphylococcus aureus) infection   ? PONV (postoperative nausea and vomiting)   ? Sleep apnea   ? pt scored 5 per Stop Bang tool at PAT visit 10/26/2015; results sent to Mauricio Po NP  ? Stroke [facility]) 05/2020  ? Rt side weak  ? Vertigo   ? ? ?PSH:  ?Past Surgical History:  ?Procedure Laterality Date  ? ABDOMINAL AORTIC ANEURYSM REPAIR W/ ENDOLUMINAL GRAFT    ? 13 years ago   ? ANGIOPLASTY    ? branch  stenting  06/08/2004  ? Dr. Lavonne Chick  ? CARDIAC CATHETERIZATION  06/08/2004  ? CARDIOVASCULAR STRESS TEST  02/14/2009  ? CYSTOSCOPY WITH LITHOLAPAXY N/A 08/09/2021  ? Procedure: CYSTOSCOPY WITH LITHOLAPAXY;  Surge

## 2021-10-31 NOTE — Patient Instructions (Addendum)
Continue to follow with [employer] research for ASPIRE trial as advised ? ?Please follow up with cardiology regarding ongoing need of Plavix - this is not needed from a stroke standpoint but may be needed for cardiology reasons  ? ?Continue to follow up with PCP regarding blood pressure management  ?Maintain strict control of cholesterol with LDL cholesterol (bad cholesterol) goal below 70 mg/dL.  ? ?Signs of a Stroke? Follow the BEFAST method:  ?Balance Watch for a sudden loss of balance, trouble with coordination or vertigo ?Eyes Is there a sudden loss of vision in one or both eyes? Or double vision?  ?Face: Ask the person to smile. Does one side of the face droop or is it numb?  ?Arms: Ask the person to raise both arms. Does one arm drift downward? Is there weakness or numbness of a leg? ?Speech: Ask the person to repeat a simple phrase. Does the speech sound slurred/strange? Is the person confused ? ?Time: If you observe any of these signs, call 911. ? ? ? ? ?Overall stable from stroke standpoint without further recommendations.  Continue close follow-up with PCP and cardiology for stroke risk factor management.  Can follow-up with [place] on an as-needed basis. ? ? ? ? ?Thank you for coming to see [place] at [facility]. I hope we have been able to provide you high quality care today. ? ?You may receive a patient satisfaction survey over the next few weeks. We would appreciate your feedback and comments so that we may continue to improve ourselves and the health of our patients. ? ?

## 2021-12-08 ENCOUNTER — Inpatient Hospital Stay (HOSPITAL_COMMUNITY): Admission: RE | Admit: 2021-12-08 | Payer: No Typology Code available for payment source | Source: Ambulatory Visit

## 2022-02-08 ENCOUNTER — Telehealth: Payer: Self-pay | Admitting: *Deleted

## 2022-02-08 NOTE — Telephone Encounter (Signed)
   Pre-operative Risk Assessment    Patient Name: [name]  DOB: Sep 21, 1940 MRN: [id number]      Request for Surgical Clearance    Procedure:   REMOVAL OF PEG TUBE  Date of Surgery:  Clearance TBD                                 Surgeon:  Alesia Morin, MD Surgeon's Group or Practice Name:  [facility] Phone number:  [phone] Fax number:  [phone]   Type of Clearance Requested:   - Pharmacy:  Hold Aspirin and Clopidogrel (Plavix) NOT INDICATED, STATES PLEASE ADVISE WHEN PT CAN STOP PRIOR TO SURGERY   Type of Anesthesia:  General    Additional requests/questions:    [name]   02/08/2022, 3:44 PM

## 2022-02-09 ENCOUNTER — Ambulatory Visit (HOSPITAL_COMMUNITY)
Admission: RE | Admit: 2022-02-09 | Discharge: 2022-02-09 | Disposition: A | Discharge status: Home / Self Care | Payer: No Typology Code available for payment source | Source: Ambulatory Visit | Attending: Nurse Practitioner | Admitting: Nurse Practitioner

## 2022-02-09 DIAGNOSIS — I712 Thoracic aortic aneurysm, without rupture, unspecified: Secondary | ICD-10-CM | POA: Insufficient documentation

## 2022-02-09 NOTE — Progress Notes (Signed)
Echocardiogram 2D Echocardiogram has been performed.  Augustine Radar 02/09/2022, 2:38 PM

## 2022-02-09 NOTE — Telephone Encounter (Signed)
   Patient Name: [name]  DOB: October 14, 1940 MRN: [id number]  Primary Cardiologist: Nanetta Batty, MD  Chart reviewed as part of pre-operative protocol coverage.   81 year old male with a history of CAD s/p stent-LAD in 2005, aortic atherosclerosis, mild AS, chronic atrial fibrillation, thoracic aortic aneurysm s/p stent in 2006, CVA, and hypertension.  He was last seen in the office on 08/04/2021 by Clair Gulling, NP.  Stress test at the time was negative for ischemia, echocardiogram was stable. He was cleared for cyctoscopy procedure.   Received surgical clearance request for removal of PEG tube with request to hold ASA and Plavix. Patient participating in Aspire study (ASA/Eliquis) per neurology, also on Plavix. Per neurology in 10/2021: "Advised to discuss use of Plavix with cardiology as this is not indicated from stroke standpoint but may be per cardiology purposes - daughter plans on discussing at follow-up visit in June." Does patient need to continue Plavix? And if so, ok to hold for PEG tube removal? Please route your response to P CV DIV PREOP.   Thank you.    Joylene Grapes, NP 02/09/2022, 12:35 PM

## 2022-02-12 NOTE — Telephone Encounter (Signed)
   Name: [name]  DOB: 12/27/1940  MRN: [id number]  Primary Cardiologist: Nanetta Batty, MD  Chart reviewed as part of pre-operative protocol coverage. Because of [name] past medical history and time since last visit, he will require a follow-up in-office visit in order to better assess preoperative cardiovascular risk.  Although removal of PEG tube is fairly low risk procedure, it appears there are outstanding questions about what his blood thinner regimen should be. He had been maintained on Plavix chronically, then enrolled in ASPIRE trial with randomization to either ASA or Eliquis - however, per neuro note 10/2021, "Continues to participate in ASPIRE trial where he is either on aspirin or apixaban although per review of facility MAR, both aspirin and apixaban prescribed. Advised to discuss use of Plavix with cardiology as this is not indicated from stroke standpoint but may be per cardiology purposes - daughter plans on discussing at follow-up visit in June." Therefore recommend formal OV to review and finalize recommendations. OK to see APP, but recommend APP review with MD once more information clarified about what exactly he is taking especially since his 6 month f/u was due 01/2022.  Pre-op covering staff: - Please schedule appointment and call patient to inform them.  - Please contact requesting surgeon's office via preferred method (i.e, phone, fax) to inform them of need for appointment prior to surgery.   Laurann Montana, PA-C  02/12/2022, 8:27 AM

## 2022-02-12 NOTE — Telephone Encounter (Signed)
Spoke with patient's wife (DPR on file) who is agreeable for patient to see Bettina Gavia, PA-C on 8/31 at 8:50 pm. Will route to requesting party.

## 2022-02-16 NOTE — Telephone Encounter (Signed)
Requesting office faxed over a duplicate request. I will send FYI pt has appt 02/22/22 with Micah Flesher, PAC. Once the pt has been cleared our office will be sure to fax clearance notes to requesting office.

## 2022-02-19 ENCOUNTER — Telehealth: Payer: Self-pay

## 2022-02-19 NOTE — Telephone Encounter (Signed)
Surgical clearance continued Per Ihor Austin NP " Patient cleared from stroke standpoint.Currently participates in [employer] trial for Aspire. Will need clearance from them in reports to hold research med either ASA or Eliquis."

## 2022-02-19 NOTE — Telephone Encounter (Signed)
Surgical clearance from [facility] has been received, signed, and faxed back along with last office visit.

## 2022-02-19 NOTE — Progress Notes (Unsigned)
Cardiology Office Note:    Date:  02/22/2022   ID:  [name], DOB 1940-10-17, MRN [id number]  PCP:  Erskine Emery, NP   [facility] Providers Cardiologist:  Nanetta Batty, MD { Referring MD: Erskine Emery, NP   Chief Complaint  Patient presents with   Pre-op Exam    CAD    History of Present Illness:    [name] is a 81 y.o. male with a hx of hypertension, CAD s/p stent-LAD in 2005, aortic atherosclerosis, mild-moderate AS, chronic atrial fibrillation, thoracic aortic aneurysm s/p stenting 2006, CVA, and hypertension.  He has a history of stenting to the LAD and diagonal branch in December 2005.  LV function was normal at that time.  He had restenting at [facility] soon afterwards.  He had a distal aortic stent graft at [facility] in 2006 for abdominal aortic aneurysm.  Repeat heart catheterization 09/2015 after a Myoview done for preoperative clearance was read as high risk.  Cardiac catheterization showed widely patent LAD diagonal branch stent and 60% stenosis in the RCA but not flow-limiting.  Echocardiogram 07/2021 showed an LVEF of 55%, no regional wall motion abnormality, mild LVH, mild MR, and functionally a bicuspid aortic valve .  Of note ascending aorta measured 48 mm, previously 42 mm in 05/2020.  Mild dilatation of the aortic root was 42 mm.  Decision was made to repeat an echocardiogram in 6 months.  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  He has residual right-sided spastic hemiparesis.  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  He was deemed not a candidate for future anticoagulation.  He does not have a neurological reason to be on Plavix.  He is not ambulatory and attends [facility] 4 days a week.   He is statin intolerant.  He is being considered for PCSK9 inhibitor.  Cardiology was contacted for preoperative risk stratification for  removal of PEG tube.  While this is a fairly low risk procedure, there appears to be some outstanding questions regarding blood thinner regimen.  He has been maintained on Plavix chronically but enrolled in the aspire trial with randomization to either aspirin or Eliquis.  Per neurology note on 10/2021: "Continues to participate in ASPIRE trial where he is either on aspirin or apixaban although per review of facility MAR, both aspirin and apixaban prescribed. Advised to discuss use of Plavix with cardiology as this is not indicated from stroke standpoint but may be per cardiology purposes - daughter plans on discussing at follow-up visit in June."  He was added to my schedule for decisions regarding preoperative evaluation and antiplatelet/anticoagulation hold.  Through verbal communication with Dr. Allyson Sabal, no cardiac reason for him to continue plavix. We will stop this.   He presents with his son in a wheelchair. They do not think he is receiving plavix in his blister packs - will confirm with pharmacy. He has no cardiac complaints. We review his risk factors for anesthesia.   Past Medical History:  Diagnosis Date   Abdominal aortic aneurysm (HCC) 2006   status post endoluminal stent graft at [facility] in 2006   Arthritis    Coronary artery disease    GERD (gastroesophageal reflux disease)    History of shingles    TIMES 2   Hyperlipidemia    Hypertension    MRSA (methicillin resistant Staphylococcus aureus) infection    PONV (postoperative nausea and vomiting)  Sleep apnea    pt scored 5 per Stop Bang tool at PAT visit 10/26/2015; results sent to Mauricio Po NP   Stroke [facility]) 05/2020   Rt side weak   Vertigo     Past Surgical History:  Procedure Laterality Date   ABDOMINAL AORTIC ANEURYSM REPAIR W/ ENDOLUMINAL GRAFT     13 years ago    ANGIOPLASTY     branch stenting  06/08/2004   Dr. Lavonne Chick   CARDIAC CATHETERIZATION  06/08/2004   CARDIOVASCULAR STRESS TEST  02/14/2009    CYSTOSCOPY WITH LITHOLAPAXY N/A 08/09/2021   Procedure: CYSTOSCOPY WITH LITHOLAPAXY;  Surgeon: Crista Elliot, MD;  Location: [facility];  Service: Urology;  Laterality: N/A;  45 MINS   I&D of left knee      IR GASTROSTOMY TUBE MOD SED  06/29/2020   LUMBAR LAMINECTOMY/DECOMPRESSION MICRODISCECTOMY N/A 11/02/2015   Procedure: MICROLUMBAR DECOMPRESSION L3-L4, L4-L5, AND L5-S1;  Surgeon: Jene Every, MD;  Location: [facility];  Service: Orthopedics;  Laterality: N/A;   PICC LINE PLACE PERIPHERAL ([facility] HX)     TRANSTHORACIC ECHOCARDIOGRAM  06/07/2004    Current Medications: No outpatient medications have been marked as taking for the 02/22/22 encounter (Office Visit) with Marcelino Duster, PA.     Allergies:   Crestor [rosuvastatin] and Lipitor [atorvastatin]   Social History   Socioeconomic History   Marital status: Married    Spouse name: Not on file   Number of children: Not on file   Years of education: Not on file   Highest education level: Not on file  Occupational History   Not on file  Tobacco Use   Smoking status: Never   Smokeless tobacco: Never  Vaping Use   Vaping Use: Never used  Substance and Sexual Activity   Alcohol use: No   Drug use: No   Sexual activity: Not on file  Other Topics Concern   Not on file  Social History Narrative   01/24/21 lives with wife   Social Determinants of Health   Financial Resource Strain: Not on file  Food Insecurity: Not on file  Transportation Needs: Not on file  Physical Activity: Not on file  Stress: Not on file  Social Connections: Not on file     Family History: The patient's family history includes Alzheimer's disease in his sister; Cancer in his sister; Heart failure in his maternal grandfather, maternal grandmother, paternal grandfather, and paternal grandmother.  ROS:   Please see the history of present illness.     All other systems reviewed and are negative.  EKGs/Labs/Other Studies Reviewed:    The following  studies were reviewed today:  Echo 02/09/22: Mild basal to mid inferior and inferolateral hypokinesis. Left ventricular ejection fraction, by estimation, is 50 to 55%. The left ventricle has low normal function. The left ventricle demonstrates regional wall motion abnormalities (see scoring diagram/findings for description). Left ventricular diastolic parameters are indeterminate. 2. Right ventricular systolic function is normal. The right ventricular size is normal. There is mildly elevated pulmonary artery systolic pressure. 3. Left atrial size was moderately dilated. The mitral valve is normal in structure. Trivial mitral valve regurgitation. No evidence of mitral stenosis. 4. Aortic valve area 2.23 cm^2 by planimetry. The aortic valve is calcified. There is moderate calcification of the aortic valve. There is moderate thickening of the aortic valve. Aortic valve regurgitation is mild. Mild to moderate aortic valve stenosis. 5. Aortic dilatation noted. There is mild dilatation of the aortic root, measuring 40  mm. There is mild dilatation of the ascending aorta, measuring 41 mm. 6. The inferior vena cava is normal in size with greater than 50% respiratory variability, suggesting right atrial pressure of 3 mmHg.   Echo 08/15/21:  1. Left ventricular ejection fraction, by estimation, is 55%. The left  ventricle has normal function. The left ventricle has no regional wall  motion abnormalities. There is mild asymmetric left ventricular  hypertrophy of the basal and septal segments.  Left ventricular diastolic parameters are indeterminate.   2. Right ventricular systolic function is normal. The right ventricular  size is normal. There is normal pulmonary artery systolic pressure.   3. Left atrial size was moderately dilated.   4. The mitral valve is abnormal. Mild mitral valve regurgitation. No  evidence of mitral stenosis.   5. 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 The aortic valve was not well visualized. There is moderate  calcification of the aortic valve. There is moderate thickening of the  aortic valve. Aortic valve regurgitation   is mild. Aortic valve sclerosis/calcification is present, without any  evidence of aortic stenosis.   6. Aortic dilatation noted. There is mild dilatation of the aortic root,  measuring 42 mm. There is severe dilatation of the ascending aorta,  measuring 48 mm.   7. The inferior vena cava is normal in size with greater than 50%  respiratory variability, suggesting right atrial pressure of 3 mmHg.   EKG:  EKG is  ordered today.  The ekg ordered today demonstrates atrial fibrillation with ventricular rate 50  Recent Labs: 07/28/2021: BUN 25; Creatinine, Ser 0.93; Hemoglobin 12.3; Platelets 161; Potassium 3.5; Sodium 138  Recent Lipid Panel    Component Value Date/Time   CHOL 129 05/27/2020 0943   TRIG 99 06/13/2020 0603   HDL 32 (L) 05/27/2020 0943   CHOLHDL 4.0 05/27/2020 0943   CHOLHDL 5.8 11/20/2007 0425   VLDL 10 11/20/2007 0425   LDLCALC 84 05/27/2020 0943   LDLDIRECT 52 08/04/2021 1214     Risk Assessment/Calculations:    CHA2DS2-VASc Score = 6   This indicates a 9.7% annual risk of stroke. The patient's score is based upon: CHF History: 0 HTN History: 1 Diabetes History: 0 Stroke History: 2 Vascular Disease History: 1 Age Score: 2 Gender Score: 0       Physical Exam:    VS:  BP 130/78   Pulse 67   Ht 6\' 2"  (1.88 m)   Wt 175 lb (79.4 kg)   SpO2 95%   BMI 22.47 kg/m     Wt Readings from Last 3 Encounters:  02/22/22 175 lb (79.4 kg)  08/09/21 167 lb 15.9 oz (76.2 kg)  08/08/21 168 lb (76.2 kg)     GEN: think elderly male in a wheelchair, NAD HEENT: Normal NECK: No JVD; No carotid bruits LYMPHATICS: No lymphadenopathy CARDIAC: irregular rhythm, regular rate, no systolic murmur RESPIRATORY:  Clear to auscultation without rales, wheezing or rhonchi  ABDOMEN:  Soft, non-tender, non-distended MUSCULOSKELETAL:  No edema; No deformity  SKIN: Warm and dry NEUROLOGIC:  Alert and oriented x 3 PSYCHIATRIC:  Normal affect   ASSESSMENT:    1. Preoperative cardiovascular examination   2. Coronary artery disease involving native coronary artery of native heart without angina pectoris   3. Thoracic aortic aneurysm without rupture, unspecified part (HCC)   4. Abdominal aortic aneurysm (AAA) without rupture, unspecified part (HCC)   5. Essential hypertension   6.  Hyperlipidemia LDL goal <70   7. History of stroke    PLAN:    In order of problems listed above:  CAD With prior stenting, hx of abnormal myoview with follow up cath showing no flow limiting obstructions and widely patent stent - recent nuclear stress test 08/08/21 read as low risk - will stop plavix - per Dr. Allyson Sabal (through direct communication), will stop plavix in the setting of ongoing ASPIRE trial - no angina, working with therapy in [place]   Thoracic aortic aneurysm AAA Echo with ascending aorta was 48 mm, previously 42 mm Repeat echo with ascending aorta measuring 41 mm - will ask Dr. Allyson Sabal to review Control BP   Hypertension No change in therapy   Chronic atrial fibrillation Rate controlled ASPIRE trial on placebo or eliquis   Hyperlipidemia Intolerant to statins LDL  52 If LDL increases, will refer to lipid clinic for PCSKi.   Hx of hemorrhagic stroke Per neurology - not an anticoagulation candidate In the ASPIRE trial, per neurology Residual right sided deficits, participating in [facility]   Preoperative evaluation for risk of MACE for IV sedation to have PEG tube removed. This is a fairly low risk procedure. Reassuring nuclear stress test 07/2021 and echocardiogram 01/2022. Does have at least moderate risk (6.6%) for cardiac complications during the perioperative period. He understands his risk and wishes to proceed.   No cardiac indication to be on plavix.  Unclear if he is taking this. If he is taking plavix, we will discontinue this medication. Will defer to neurology / ASPIRE trial physician for recommendations on holding study medications.    Pt would like to see Dr. Allyson Sabal. Will place on his schedule in 5-6 months.      Medication Adjustments/Labs and Tests Ordered: Current medicines are reviewed at length with the patient today.  Concerns regarding medicines are outlined above.  Orders Placed This Encounter  Procedures   EKG 12-Lead   No orders of the defined types were placed in this encounter.   Patient Instructions  Medication Instructions:   -Stop taking clopidogreal (plavix)- finish blister pack and then hold for 7 days prior to surgery.  *If you need a refill on your cardiac medications before your next appointment, please call your pharmacy*   Follow-Up: At [facility], you and your health needs are our priority.  As part of our continuing mission to provide you with exceptional heart care, we have created designated Provider Care Teams.  These Care Teams include your primary Cardiologist (physician) and Advanced Practice Providers (APPs -  Physician Assistants and Nurse Practitioners) who all work together to provide you with the care you need, when you need it.  We recommend signing up for the patient portal called "MyChart".  Sign up information is provided on this After Visit Summary.  MyChart is used to connect with patients for Virtual Visits (Telemedicine).  Patients are able to view lab/test results, encounter notes, upcoming appointments, etc.  Non-urgent messages can be sent to your provider as well.   To learn more about what you can do with MyChart, go to [email or web address].    Your next appointment:   5-6 month(s)  The format for your next appointment:   In Person  Provider:   Nanetta Batty, MD    Signed, Roe Rutherford Kenetha Cozza, [place]  02/22/2022 10:31 AM    [facility]

## 2022-02-22 ENCOUNTER — Encounter: Payer: Self-pay | Admitting: Physician Assistant

## 2022-02-22 ENCOUNTER — Ambulatory Visit: Payer: No Typology Code available for payment source | Attending: Physician Assistant | Admitting: Physician Assistant

## 2022-02-22 VITALS — BP 130/78 | HR 67 | Ht 74.0 in | Wt 175.0 lb

## 2022-02-22 DIAGNOSIS — I712 Thoracic aortic aneurysm, without rupture, unspecified: Secondary | ICD-10-CM

## 2022-02-22 DIAGNOSIS — E785 Hyperlipidemia, unspecified: Secondary | ICD-10-CM

## 2022-02-22 DIAGNOSIS — I714 Abdominal aortic aneurysm, without rupture, unspecified: Secondary | ICD-10-CM

## 2022-02-22 DIAGNOSIS — I10 Essential (primary) hypertension: Secondary | ICD-10-CM

## 2022-02-22 DIAGNOSIS — I2510 Atherosclerotic heart disease of native coronary artery without angina pectoris: Secondary | ICD-10-CM | POA: Diagnosis not present

## 2022-02-22 DIAGNOSIS — Z01810 Encounter for preprocedural cardiovascular examination: Secondary | ICD-10-CM | POA: Diagnosis not present

## 2022-02-22 DIAGNOSIS — Z8673 Personal history of transient ischemic attack (TIA), and cerebral infarction without residual deficits: Secondary | ICD-10-CM

## 2022-02-22 NOTE — Patient Instructions (Signed)
Medication Instructions:   -Stop taking clopidogreal (plavix)- finish blister pack and then hold for 7 days prior to surgery.  *If you need a refill on your cardiac medications before your next appointment, please call your pharmacy*   Follow-Up: At [facility], you and your health needs are our priority.  As part of our continuing mission to provide you with exceptional heart care, we have created designated Provider Care Teams.  These Care Teams include your primary Cardiologist (physician) and Advanced Practice Providers (APPs -  Physician Assistants and Nurse Practitioners) who all work together to provide you with the care you need, when you need it.  We recommend signing up for the patient portal called "MyChart".  Sign up information is provided on this After Visit Summary.  MyChart is used to connect with patients for Virtual Visits (Telemedicine).  Patients are able to view lab/test results, encounter notes, upcoming appointments, etc.  Non-urgent messages can be sent to your provider as well.   To learn more about what you can do with MyChart, go to [email or web address].    Your next appointment:   5-6 month(s)  The format for your next appointment:   In Person  Provider:   Nanetta Batty, MD

## 2022-07-24 ENCOUNTER — Ambulatory Visit
Payer: No Typology Code available for payment source | Attending: Cardiovascular Disease | Admitting: Cardiovascular Disease

## 2022-08-08 ENCOUNTER — Encounter: Payer: Self-pay | Admitting: Cardiovascular Disease

## 2022-08-08 ENCOUNTER — Ambulatory Visit
Payer: No Typology Code available for payment source | Attending: Cardiovascular Disease | Admitting: Cardiovascular Disease

## 2022-08-08 VITALS — BP 126/76 | HR 60 | Ht 74.0 in

## 2022-08-08 DIAGNOSIS — I10 Essential (primary) hypertension: Secondary | ICD-10-CM | POA: Diagnosis not present

## 2022-08-08 DIAGNOSIS — I712 Thoracic aortic aneurysm, without rupture, unspecified: Secondary | ICD-10-CM | POA: Diagnosis not present

## 2022-08-08 DIAGNOSIS — E785 Hyperlipidemia, unspecified: Secondary | ICD-10-CM

## 2022-08-08 DIAGNOSIS — I714 Abdominal aortic aneurysm, without rupture, unspecified: Secondary | ICD-10-CM

## 2022-08-08 DIAGNOSIS — I2510 Atherosclerotic heart disease of native coronary artery without angina pectoris: Secondary | ICD-10-CM | POA: Diagnosis not present

## 2022-08-08 DIAGNOSIS — E782 Mixed hyperlipidemia: Secondary | ICD-10-CM

## 2022-08-08 DIAGNOSIS — I350 Nonrheumatic aortic (valve) stenosis: Secondary | ICD-10-CM

## 2022-08-08 NOTE — Assessment & Plan Note (Signed)
History of CAD status post LAD/diagonal branch intervention by Dr. Melvern Banker  06/08/2004.  I recatheterized him radially/20/2017 revealing widely patent LAD and diagonal branch stent with 60% mid RCA stenosis that did not appear to be flow-limiting.  He denies chest pain or shortness of breath.

## 2022-08-08 NOTE — Assessment & Plan Note (Signed)
History of small ascending thoracic aortic aneurysm measuring 41 mm by 2D echo performed 02/09/2022.  At this point, given his age and comorbidities including stroke confined to wheelchair I do not feel compelled to continue to follow this.

## 2022-08-08 NOTE — Patient Instructions (Signed)

## 2022-08-08 NOTE — Assessment & Plan Note (Signed)
2D echo performed 02/09/2022 revealed normal LV systolic function with moderate aortic stenosis.  The aortic valve area measured 2.23 cm.  The patient is asymptomatic.  No further evaluation is warranted at this time.

## 2022-08-08 NOTE — Progress Notes (Signed)
08/08/2022 [name]   Jan 19, 1941  [id number]  Primary Physician Rhea Bleacher, NP Primary Cardiologist: Lorretta Harp MD [name], [place], [place]  HPI:  [name] is a 82 y.o.   mildly overweight married Caucasian male father of 2, grandfather to 3 grandchildren who I last saw 05/27/2020.Marland Kitchen  He is accompanied by his grandson Quita Skye today.  He was previously a patient of Dr. Merri Ray who had done LAD and diagonal branch stenting June 08, 2004. He had normal LV function at that time. He had re-stenting at [facility] subsequent to that. He also had an Endoluminal stent graft placed at [facility] in 2006 for an abdominal aortic aneurysm. He was last ultrasounded 2 years ago and had no evidence of endoleak. His other problems include hypertension and hyperlipidemia. He denies chest pain or shortness of breath.when I saw him a year ago he was complaining of some lower extremity discomfort with ambulation. Dopplers performed 01/01/14 revealed no evidence of obstructive disease. He apparently needs back surgery. A pharmacologic Myoview stress test done for preoperative clearance was read as high risk with severe anteroseptal and apical ischemia. Because of this he underwent outpatient cardiac catheterization by myself performed radially on 10/13/15 revealing a widely patent LAD and diagonal branch stent. He did have a 60% lesion in the mid dominant RCA which did not appear flow-limiting. He has back surgery successfully without complication after that.   When I saw him 6 months ago he was found to be in A. fib with controlled ventricular response.  He was unaware of this.  I did begin him on Eliquis oral anticoagulation, discontinued his Plavix and I am going to decrease his aspirin from 3 25-81.  He denies chest pain or shortness of breath.  2D echo revealed normal LV systolic function with a 44 mm ascending thoracic aortic aneurysm.   Since I saw him in the office 2 years ago he did have a  stroke several weeks after seeing me.  He unfortunately is currently wheelchair-bound.  He did have a G-tube removed and is able to eat food and has gained weight.  He is otherwise asymptomatic.  He is involved in a study randomizing Eliquis and aspirin for placebo.   Current Meds  Medication Sig   acetaminophen (TYLENOL) 160 MG/5ML solution Place 20.3 mLs (650 mg total) into feeding tube every 4 (four) hours as needed for mild pain (or temp > 37.5 C (99.5 F)).   docusate sodium (COLACE) 100 MG capsule Take 100 mg by mouth 2 (two) times daily.   finasteride (PROSCAR) 5 MG tablet Take 5 mg by mouth daily.   gabapentin (NEURONTIN) 100 MG capsule Take 100 mg by mouth 2 (two) times daily.   Iron-Vitamin C (VITRON-C PO) Take by mouth.   MAGNESIUM OXIDE PO Take 500 mg by mouth at bedtime.   metoprolol tartrate (LOPRESSOR) 50 MG tablet Place 1 tablet (50 mg total) into feeding tube 3 (three) times daily. (Patient taking differently: Take 50 mg by mouth 2 (two) times daily.)   mupirocin cream (BACTROBAN) 2 % Apply 1 application topically daily as needed (Rash).   nystatin (MYCOSTATIN/NYSTOP) powder Apply 1 application topically 2 (two) times daily.     Allergies  Allergen Reactions   Crestor [Rosuvastatin] Other (See Comments)    Severe pain   Lipitor [Atorvastatin] Other (See Comments)    Severe pain    Social History   Socioeconomic History   Marital status: Married  Spouse name: Not on file   Number of children: Not on file   Years of education: Not on file   Highest education level: Not on file  Occupational History   Not on file  Tobacco Use   Smoking status: Never   Smokeless tobacco: Never  Vaping Use   Vaping Use: Never used  Substance and Sexual Activity   Alcohol use: No   Drug use: No   Sexual activity: Not on file  Other Topics Concern   Not on file  Social History Narrative   01/24/21 lives with wife   Social Determinants of Health   Financial Resource Strain:  Not on file  Food Insecurity: Not on file  Transportation Needs: Not on file  Physical Activity: Not on file  Stress: Not on file  Social Connections: Not on file  Intimate Partner Violence: Not on file     Review of Systems: General: negative for chills, fever, night sweats or weight changes.  Cardiovascular: negative for chest pain, dyspnea on exertion, edema, orthopnea, palpitations, paroxysmal nocturnal dyspnea or shortness of breath Dermatological: negative for rash Respiratory: negative for cough or wheezing Urologic: negative for hematuria Abdominal: negative for nausea, vomiting, diarrhea, bright red blood per rectum, melena, or hematemesis Neurologic: negative for visual changes, syncope, or dizziness All other systems reviewed and are otherwise negative except as noted above.    Blood pressure 126/76, pulse 60, height 6' 2"$  (1.88 m), SpO2 96 %.  General appearance: alert and no distress Neck: no adenopathy, no carotid bruit, no JVD, supple, symmetrical, trachea midline, and thyroid not enlarged, symmetric, no tenderness/mass/nodules Lungs: clear to auscultation bilaterally Heart: irregularly irregular rhythm Extremities: extremities normal, atraumatic, no cyanosis or edema Pulses: 2+ and symmetric Skin: Skin color, texture, turgor normal. No rashes or lesions Neurologic: Grossly normal  EKG atrial fibrillation with ventricular sponsor of 60.  I personally reviewed this EKG.  ASSESSMENT AND PLAN:   HYPERTENSION, BENIGN SYSTEMIC History of essential hypertension blood pressure measured today 126/76.  He is on metoprolol.  Coronary atherosclerosis History of CAD status post LAD/diagonal branch intervention by Dr. Melvern Banker  06/08/2004.  I recatheterized him radially/20/2017 revealing widely patent LAD and diagonal branch stent with 60% mid RCA stenosis that did not appear to be flow-limiting.  He denies chest pain or shortness of breath.  Thoracic aortic aneurysm [facility])  s/p stent 2006 History of small ascending thoracic aortic aneurysm measuring 41 mm by 2D echo performed 02/09/2022.  At this point, given his age and comorbidities including stroke confined to wheelchair I do not feel compelled to continue to follow this.  Hyperlipidemia History of hyperlipidemia not on statin therapy lipid profile performed 05/27/2020 revealing LDL 52.  AAA (abdominal aortic aneurysm) without rupture (HCC) History of abdominal aortic aneurysm status post endoluminal stent grafting at [facility] in 2006.  Abdominal ultrasound performed 12/30/2019 revealed no evidence of endoleak.  Aortic stenosis, moderate 2D echo performed 02/09/2022 revealed normal LV systolic function with moderate aortic stenosis.  The aortic valve area measured 2.23 cm.  The patient is asymptomatic.  No further evaluation is warranted at this time.     Lorretta Harp MD FACP,FACC,FAHA, [facility] 08/08/2022 3:44 PM

## 2022-08-08 NOTE — Assessment & Plan Note (Signed)
History of abdominal aortic aneurysm status post endoluminal stent grafting at [facility] in 2006.  Abdominal ultrasound performed 12/30/2019 revealed no evidence of endoleak.

## 2022-08-08 NOTE — Assessment & Plan Note (Signed)
History of essential hypertension blood pressure measured today 126/76.  He is on metoprolol.

## 2022-08-08 NOTE — Assessment & Plan Note (Signed)
History of hyperlipidemia not on statin therapy lipid profile performed 05/27/2020 revealing LDL 52.

## 2023-01-07 ENCOUNTER — Other Ambulatory Visit (HOSPITAL_COMMUNITY): Payer: Self-pay

## 2023-01-07 MED ORDER — STUDY - ASPIRE - APIXABAN 5 MG OR PLACEBO TABLET (PI-SETHI)
5.0000 mg | ORAL_TABLET | Freq: Two times a day (BID) | ORAL | 0 refills | Status: DC
Start: 2023-01-07 — End: 2023-05-29

## 2023-01-07 MED ORDER — STUDY - ASPIRE - ASPIRIN 81 MG OR PLACEBO TABLET (PI-SETHI)
81.0000 mg | ORAL_TABLET | Freq: Every day | ORAL | 0 refills | Status: DC
Start: 2023-01-07 — End: 2023-05-29

## 2023-06-26 DEATH — deceased
